# Patient Record
Sex: Female | Born: 1970 | Race: White | Hispanic: No | Marital: Married | Smoking: Never smoker
Health system: Southern US, Community
[De-identification: ages and names within clinical notes are randomized; demographics above are authoritative.]

## PROBLEM LIST (undated history)

## (undated) DIAGNOSIS — G4730 Sleep apnea, unspecified: Secondary | ICD-10-CM

## (undated) DIAGNOSIS — T7840XA Allergy, unspecified, initial encounter: Secondary | ICD-10-CM

## (undated) DIAGNOSIS — Z87442 Personal history of urinary calculi: Secondary | ICD-10-CM

## (undated) DIAGNOSIS — E119 Type 2 diabetes mellitus without complications: Secondary | ICD-10-CM

## (undated) DIAGNOSIS — I10 Essential (primary) hypertension: Secondary | ICD-10-CM

## (undated) DIAGNOSIS — B029 Zoster without complications: Secondary | ICD-10-CM

## (undated) DIAGNOSIS — G709 Myoneural disorder, unspecified: Secondary | ICD-10-CM

## (undated) DIAGNOSIS — K219 Gastro-esophageal reflux disease without esophagitis: Secondary | ICD-10-CM

## (undated) DIAGNOSIS — F419 Anxiety disorder, unspecified: Secondary | ICD-10-CM

## (undated) DIAGNOSIS — M1990 Unspecified osteoarthritis, unspecified site: Secondary | ICD-10-CM

## (undated) DIAGNOSIS — R112 Nausea with vomiting, unspecified: Secondary | ICD-10-CM

## (undated) DIAGNOSIS — J45909 Unspecified asthma, uncomplicated: Secondary | ICD-10-CM

## (undated) DIAGNOSIS — R011 Cardiac murmur, unspecified: Secondary | ICD-10-CM

## (undated) DIAGNOSIS — Z9889 Other specified postprocedural states: Secondary | ICD-10-CM

## (undated) HISTORY — DX: Sleep apnea, unspecified: G47.30

## (undated) HISTORY — PX: ABDOMINAL HYSTERECTOMY: SHX81

## (undated) HISTORY — DX: Zoster without complications: B02.9

## (undated) HISTORY — PX: BACK SURGERY: SHX140

## (undated) HISTORY — DX: Allergy, unspecified, initial encounter: T78.40XA

## (undated) HISTORY — DX: Cardiac murmur, unspecified: R01.1

## (undated) HISTORY — DX: Type 2 diabetes mellitus without complications: E11.9

## (undated) HISTORY — PX: TOTAL ABDOMINAL HYSTERECTOMY: SHX209

---

## 2004-06-11 HISTORY — PX: DIAGNOSTIC LAPAROSCOPY: SUR761

## 2004-06-13 ENCOUNTER — Ambulatory Visit: Payer: Self-pay | Admitting: General Surgery

## 2005-03-20 ENCOUNTER — Ambulatory Visit: Payer: Self-pay

## 2005-08-21 ENCOUNTER — Ambulatory Visit: Payer: Self-pay

## 2006-12-24 ENCOUNTER — Ambulatory Visit: Payer: Self-pay

## 2007-01-02 ENCOUNTER — Ambulatory Visit: Payer: Self-pay

## 2008-04-08 ENCOUNTER — Ambulatory Visit: Payer: Self-pay | Admitting: Internal Medicine

## 2008-04-29 ENCOUNTER — Ambulatory Visit: Payer: Self-pay | Admitting: Internal Medicine

## 2008-08-12 ENCOUNTER — Ambulatory Visit: Payer: Self-pay

## 2008-12-25 ENCOUNTER — Emergency Department: Payer: Self-pay | Admitting: Emergency Medicine

## 2010-03-02 ENCOUNTER — Encounter: Payer: Self-pay | Admitting: Podiatry

## 2010-03-11 ENCOUNTER — Encounter: Payer: Self-pay | Admitting: Podiatry

## 2010-06-11 HISTORY — PX: SPINAL FUSION: SHX223

## 2010-08-11 ENCOUNTER — Ambulatory Visit: Payer: Self-pay | Admitting: Sports Medicine

## 2010-09-04 ENCOUNTER — Encounter (HOSPITAL_COMMUNITY): Payer: PRIVATE HEALTH INSURANCE | Attending: Neurosurgery

## 2010-09-04 LAB — CBC
HCT: 39.5 % (ref 36.0–46.0)
MCH: 27.9 pg (ref 26.0–34.0)
MCHC: 33.2 g/dL (ref 30.0–36.0)
MCV: 84.2 fL (ref 78.0–100.0)
RDW: 13.4 % (ref 11.5–15.5)

## 2010-09-08 ENCOUNTER — Inpatient Hospital Stay (HOSPITAL_COMMUNITY)
Admission: RE | Admit: 2010-09-08 | Discharge: 2010-09-11 | DRG: 472 | Disposition: A | Discharge status: Home / Self Care | Payer: PRIVATE HEALTH INSURANCE | Source: Ambulatory Visit | Attending: Neurosurgery | Admitting: Neurosurgery

## 2010-09-08 ENCOUNTER — Inpatient Hospital Stay (HOSPITAL_COMMUNITY): Payer: PRIVATE HEALTH INSURANCE

## 2010-09-08 DIAGNOSIS — Z6841 Body Mass Index (BMI) 40.0 and over, adult: Secondary | ICD-10-CM

## 2010-09-08 DIAGNOSIS — Z01812 Encounter for preprocedural laboratory examination: Secondary | ICD-10-CM

## 2010-09-08 DIAGNOSIS — G4733 Obstructive sleep apnea (adult) (pediatric): Secondary | ICD-10-CM | POA: Diagnosis present

## 2010-09-08 DIAGNOSIS — M5000 Cervical disc disorder with myelopathy, unspecified cervical region: Principal | ICD-10-CM | POA: Diagnosis present

## 2010-09-08 DIAGNOSIS — M4000 Postural kyphosis, site unspecified: Secondary | ICD-10-CM | POA: Diagnosis present

## 2010-09-08 DIAGNOSIS — Z01810 Encounter for preprocedural cardiovascular examination: Secondary | ICD-10-CM

## 2010-09-12 NOTE — Op Note (Signed)
NAMEGLADIOLA, MADORE              ACCOUNT NO.:  [id number]  MEDICAL RECORD NO.:  [id number]           PATIENT TYPE:  I  LOCATION:  3104                         FACILITY:  [facility]  PHYSICIAN:  Danae Orleans. Venetia Maxon, M.D.  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  PROCEDURE:  Anterior cervical decompression and fusion C3-4, C4-5, and C5-6 levels, PEEK interbody cages, morselized bone autograft, allograft, PureGen, and anterior cervical plate.  SURGEON:  Danae Orleans. Venetia Maxon, MD  ASSISTANT:  Georgiann Cocker, RN and Hewitt Shorts, MD  ANESTHESIA:  General endotracheal anesthesia.  ESTIMATED BLOOD LOSS:  100 mL.  COMPLICATIONS:  None.  DISPOSITION:  Recovery.  INDICATION:  Tricia Hill is a 40 year old woman with severe cervical spondylitic myelopathy.  She is morbidly obese with a BMI of greater than 45.  She has significant cord compression at C3-4 and C4-5 levels with severe spondylosis at C5-6.  We elected to take her to surgery for anterior cervical decompression and fusion at C3-C6 levels.  PROCEDURE:  Ms. Spraggins was brought to the operating room.  Following a satisfactory and uncomplicated induction of general endotracheal anesthesia and placement of intravenous lines and Foley catheter, she was placed in a supine position on the operating table.  Her neck was placed in neutral alignment.  She was placed in 5 pounds of halter traction using padding because of her large body habitus.  Shoulders were taped to facilitate x-ray visualization.  Her anterior neck was then prepped and draped in usual sterile fashion.   Area of planned incision was infiltrated with local lidocaine.  Incision was made from the midline to the anterior border of sternocleidomastoid muscle.  Using blunt dissection, the carotid sheath was kept lateral and trachea and esophagus kept medial exposing the anterior cervical spine.  Bent spinal needles were placed where it was felt to be the C3-4, C4-5 levels.  The was confirmed on intraoperative x-ray with visualization of the needle at the C3-4 level.  During exposure, a small branch of the internal jugular was evulsed from the jugular vein, this was controlled with pledgets of Gelfoam and subsequently at the end of the case, it was inspected and found to be complete without any bleeding whatsoever and was covered with Surgicel.  Valsalva maneuver also did not demonstrate any bleeding whatsoever.  After further exposing the spine, the ventral osteophytes were removed with osteophyte tool at C3-4, C4-5, and C5-6 levels and using sequential decompression initially the C3-4 and subsequently C4-5 and C5-6 levels distraction pins were placed, interspace was incised, the spondylitic disk material was removed. Endplates were eburnated with high-speed drill and uncinate spurs were drilled down with high-speed drill.  The spinal cord dura was decompressed at each level and hemostasis was assured.  The C4-5 level was significantly worse and there was spondylitic rebar  at C3-4 which was taken down off the inferior aspect of C3.  At C4-5, there was a marked amount of herniated disk material, which was removed.  After each level was decompressed, hemostasis was assured, PEEK cages were packed with PureGen soaked profuse blocks accompanied with autograft removed at the time of the decompression.  A 5-mm medium PEEK cage was inserted at C3-4, a small 6-mm cage was inserted at C4-5, and a medium 6-mm cage was inserted at C5-6.  After all cages were placed, traction weight was removed, 48-mm  Trestle anterior cervical plate was then affixed to the anterior cervical spine with 12-mm variable angle screws, 2 at C3, 2 at C4, 2 at C5, 2 at C6; all screws had excellent purchase.  Locking mechanisms were engaged.  Wound was irrigated.  Soft tissues were inspected and found to be in good repair.  No evidence of any bleeding. The platysma layer was closed with 3-0 Vicryl sutures.  Skin edges were approximated 3-0 Vicryl subcu stitch.  Wound was dressed with Dermabond. At the end of the case, x-ray demonstrated superior aspect construct and appeared to be well positioned.  The patient was extubated in the operating room and taken to the recovery in a stable and satisfactory condition having tolerated the operation well.  Counts were correct at the end of the case.     Danae Orleans. Venetia Maxon, M.D.     JDS/MEDQ  D:  09/08/2010  T:  09/09/2010  Job:  [id number]  Electronically Signed by Maeola Harman M.D. on 09/12/2010 07:35:01 AM

## 2011-08-28 ENCOUNTER — Encounter: Payer: Self-pay | Admitting: Obstetrics and Gynecology

## 2011-09-10 ENCOUNTER — Encounter: Payer: Self-pay | Admitting: Obstetrics and Gynecology

## 2014-06-11 DIAGNOSIS — E119 Type 2 diabetes mellitus without complications: Secondary | ICD-10-CM

## 2014-06-11 HISTORY — DX: Type 2 diabetes mellitus without complications: E11.9

## 2014-09-15 ENCOUNTER — Encounter: Payer: Self-pay | Admitting: *Deleted

## 2015-01-31 ENCOUNTER — Encounter: Payer: Self-pay | Admitting: Family Medicine

## 2015-01-31 ENCOUNTER — Ambulatory Visit (INDEPENDENT_AMBULATORY_CARE_PROVIDER_SITE_OTHER): Payer: 59 | Admitting: Family Medicine

## 2015-01-31 VITALS — BP 154/90 | HR 80 | Temp 98.5°F | Ht 59.6 in | Wt 252.6 lb

## 2015-01-31 DIAGNOSIS — E1169 Type 2 diabetes mellitus with other specified complication: Secondary | ICD-10-CM | POA: Diagnosis not present

## 2015-01-31 DIAGNOSIS — Z114 Encounter for screening for human immunodeficiency virus [HIV]: Secondary | ICD-10-CM

## 2015-01-31 DIAGNOSIS — R030 Elevated blood-pressure reading, without diagnosis of hypertension: Secondary | ICD-10-CM | POA: Diagnosis not present

## 2015-01-31 DIAGNOSIS — E1165 Type 2 diabetes mellitus with hyperglycemia: Secondary | ICD-10-CM | POA: Diagnosis not present

## 2015-01-31 DIAGNOSIS — Z13220 Encounter for screening for lipoid disorders: Secondary | ICD-10-CM

## 2015-01-31 DIAGNOSIS — Z23 Encounter for immunization: Secondary | ICD-10-CM | POA: Diagnosis not present

## 2015-01-31 DIAGNOSIS — G4733 Obstructive sleep apnea (adult) (pediatric): Secondary | ICD-10-CM

## 2015-01-31 DIAGNOSIS — G4730 Sleep apnea, unspecified: Secondary | ICD-10-CM | POA: Insufficient documentation

## 2015-01-31 DIAGNOSIS — IMO0002 Reserved for concepts with insufficient information to code with codable children: Secondary | ICD-10-CM

## 2015-01-31 DIAGNOSIS — E118 Type 2 diabetes mellitus with unspecified complications: Secondary | ICD-10-CM | POA: Insufficient documentation

## 2015-01-31 DIAGNOSIS — IMO0001 Reserved for inherently not codable concepts without codable children: Secondary | ICD-10-CM

## 2015-01-31 DIAGNOSIS — E785 Hyperlipidemia, unspecified: Secondary | ICD-10-CM

## 2015-01-31 DIAGNOSIS — R7309 Other abnormal glucose: Secondary | ICD-10-CM

## 2015-01-31 DIAGNOSIS — Z90710 Acquired absence of both cervix and uterus: Secondary | ICD-10-CM

## 2015-01-31 LAB — MICROSCOPIC EXAMINATION

## 2015-01-31 LAB — UA/M W/RFLX CULTURE, ROUTINE
BILIRUBIN UA: NEGATIVE
Glucose, UA: NEGATIVE
Ketones, UA: NEGATIVE
LEUKOCYTES UA: NEGATIVE
NITRITE UA: NEGATIVE
PH UA: 5 (ref 5.0–7.5)
RBC UA: NEGATIVE
Specific Gravity, UA: 1.03 (ref 1.005–1.030)
Urobilinogen, Ur: 0.2 mg/dL (ref 0.2–1.0)

## 2015-01-31 LAB — MICROALBUMIN, URINE WAIVED
Creatinine, Urine Waived: 300 mg/dL (ref 10–300)
MICROALB, UR WAIVED: 150 mg/L — AB (ref 0–19)

## 2015-01-31 LAB — LIPID PANEL PICCOLO, WAIVED
CHOL/HDL RATIO PICCOLO,WAIVE: 4.9 mg/dL
Cholesterol Piccolo, Waived: 217 mg/dL — ABNORMAL HIGH (ref <200)
HDL CHOL PICCOLO, WAIVED: 45 mg/dL — AB (ref >59)
LDL Chol Calc Piccolo Waived: 120 mg/dL — ABNORMAL HIGH (ref <100)
TRIGLYCERIDES PICCOLO,WAIVED: 261 mg/dL — AB (ref <150)
VLDL CHOL CALC PICCOLO,WAIVE: 52 mg/dL — AB (ref <30)

## 2015-01-31 LAB — CBC WITH DIFFERENTIAL/PLATELET
HEMOGLOBIN: 13.4 g/dL (ref 11.1–15.9)
Hematocrit: 39.9 % (ref 34.0–46.6)
Lymphocytes Absolute: 2.5 10*3/uL (ref 0.7–3.1)
Lymphs: 30 %
MCH: 29.3 pg (ref 26.6–33.0)
MCHC: 33.6 g/dL (ref 31.5–35.7)
MCV: 87 fL (ref 79–97)
MID (Absolute): 0.5 10*3/uL (ref 0.1–1.6)
MID: 6 %
Neutrophils Absolute: 5.1 10*3/uL (ref 1.4–7.0)
Neutrophils: 64 %
Platelets: 267 10*3/uL (ref 150–379)
RBC: 4.57 x10E6/uL (ref 3.77–5.28)
RDW: 13.9 % (ref 12.3–15.4)
WBC: 8.1 10*3/uL (ref 3.4–10.8)

## 2015-01-31 LAB — BAYER DCA HB A1C WAIVED: HB A1C (BAYER DCA - WAIVED): 10.3 % — ABNORMAL HIGH (ref <7.0)

## 2015-01-31 MED ORDER — METFORMIN HCL ER (MOD) 1000 MG PO TB24: 1000.0000 mg | ORAL_TABLET | Freq: Two times a day (BID) | ORAL | Status: DC

## 2015-01-31 MED ORDER — FREESTYLE SYSTEM KIT: 1.0000 | PACK | Status: DC | PRN

## 2015-01-31 MED ORDER — GLUCOSE BLOOD VI STRP: ORAL_STRIP | Status: DC

## 2015-01-31 MED ORDER — FREESTYLE LANCETS MISC: Status: DC

## 2015-01-31 NOTE — Progress Notes (Signed)
BP 153/95 mmHg  Pulse 80  Temp(Src) 98.5 F (36.9 C)  Ht 4' 11.6" (1.514 m)  Wt 252 lb 9.6 oz (114.579 kg)  BMI 49.99 kg/m2  SpO2 95%  LMP  (LMP Unknown)   Subjective:    Patient ID: Tricia Hill, female    DOB: 1970/12/11, 44 y.o.   MRN: [id number]  HPI: Tricia Hill is a 44 y.o. female who presents today to establish care and for evaluation of    Chief Complaint  Patient presents with  . Diabetes   DIABETES- Had a 6.9 A1c 3-4 years ago, has never been on medicine. Never followed up.  Hypoglycemic episodes:no Polydipsia/polyuria: yes Visual disturbance: yes Chest pain: no Paresthesias: no Glucose Monitoring: no Blood Pressure Monitoring: not checking Retinal Examination: Not up to Date Foot Exam: Up to Date Diabetic Education: Not Completed Pneumovax: to be given next visit Influenza: To get through work Aspirin: no  Relevant past medical, surgical, family and social history reviewed and updated as indicated. Interim medical history since our last visit reviewed. Allergies and medications reviewed and updated.  Review of Systems  Constitutional: Negative.   Respiratory: Negative.   Cardiovascular: Negative.   Gastrointestinal: Negative.   Endocrine: Positive for heat intolerance. Negative for cold intolerance, polydipsia, polyphagia and polyuria.       Hot flashes for about the past year  Musculoskeletal: Positive for back pain. Negative for myalgias, joint swelling, arthralgias, gait problem, neck pain and neck stiffness.  Psychiatric/Behavioral: Negative.     Per HPI unless specifically indicated above     Objective:    BP 153/95 mmHg  Pulse 80  Temp(Src) 98.5 F (36.9 C)  Ht 4' 11.6" (1.514 m)  Wt 252 lb 9.6 oz (114.579 kg)  BMI 49.99 kg/m2  SpO2 95%  LMP  (LMP Unknown)  Wt Readings from Last 3 Encounters:  01/31/15 252 lb 9.6 oz (114.579 kg)    Physical Exam  Constitutional: She is oriented to person, place, and time. She appears  well-developed and well-nourished. No distress.  Morbidly obese  HENT:  Head: Normocephalic and atraumatic.  Right Ear: Hearing normal.  Left Ear: Hearing normal.  Nose: Nose normal.  Eyes: Conjunctivae and lids are normal. Right eye exhibits no discharge. Left eye exhibits no discharge. No scleral icterus.  Cardiovascular: Normal rate, regular rhythm and normal heart sounds.  Exam reveals no gallop and no friction rub.   No murmur heard. Pulmonary/Chest: Effort normal and breath sounds normal. No respiratory distress. She has no wheezes. She has no rales. She exhibits no tenderness.  Musculoskeletal: Normal range of motion.  Neurological: She is alert and oriented to person, place, and time.  Skin: Skin is warm, dry and intact. No rash noted. No erythema. No pallor.  Psychiatric: She has a normal mood and affect. Her speech is normal and behavior is normal. Judgment and thought content normal. Cognition and memory are normal.  Nursing note and vitals reviewed.       Assessment & Plan:   Problem List Items Addressed This Visit      Respiratory   OSA (obstructive sleep apnea)    Referral to sleep medicine for repeat Sleep study (has gained 50+ lbs in the last 2 years) and to be fit for new mask. This possibly contributing to elevated BP and cholesterol as well as weight gain and fatigue. Await sleep study and treat as needed.       Relevant Orders   Ambulatory referral to Sleep Studies  Other   S/P hysterectomy   Uncontrolled diabetes mellitus - Primary    A1c came back today at 10.3. Will start 1000mg  of metformin ER BID- to start with 1000mg  only qAM for 1 week to avoid side effects. Will refer to the lifestyle center. Will refer to opthalmology, as she has been having blurred vision. Foot exam done today was normal. Microalbumin +, may need ACE-I, but she would like to hold on further medicine at this time. Education given. Work on diet and exercise and check back in in 1  month. Due for A1c again in 3 months. If still elevated, will consider victoza for weight loss.       Relevant Medications   metFORMIN (GLUMETZA) 1000 MG (MOD) 24 hr tablet   Other Relevant Orders   Ambulatory referral to Nutrition and Diabetic Education   Ambulatory referral to Ophthalmology   Pneumococcal polysaccharide vaccine 23-valent greater than or equal to 2yo subcutaneous/IM   Elevated BP    Possibly due to untreated OSA. Will give Dash diet and follow up in 1 month. Work on diet and exercise. Referral back to Sleep medicine made to get OSA treated. +microalb- will likely need ACE-I in future.       Relevant Orders   Comprehensive metabolic panel   CBC With Differential/Platelet   Microalbumin, Urine Waived   UA/M w/rflx Culture, Routine   TSH   Morbid obesity    Referral to lifestyle center made today for nutrition/diabetes education. Work on diet and exercise. Will go to victoza 2nd line if metformin doesn't bring her where she needs to go to aide in weight loss.       Relevant Medications   metFORMIN (GLUMETZA) 1000 MG (MOD) 24 hr tablet   Other Relevant Orders   Comprehensive metabolic panel   CBC With Differential/Platelet   Hyperlipidemia associated with type 2 diabetes mellitus   Relevant Medications   metFORMIN (GLUMETZA) 1000 MG (MOD) 24 hr tablet    Other Visit Diagnoses    Screening for HIV (human immunodeficiency virus)        Checked today.     Relevant Orders    HIV antibody    Immunization due        Tdap given today. Wants pneumovax, but not given- will given next visit. Ordered today.    Relevant Orders    Tdap vaccine greater than or equal to 7yo IM (Completed)    Pneumococcal polysaccharide vaccine 23-valent greater than or equal to 2yo subcutaneous/IM    Elevated hemoglobin A1c        In the past, will check A1c today.     Relevant Orders    Comprehensive metabolic panel    CBC With Differential/Platelet    Bayer DCA Hb A1c Waived    UA/M  w/rflx Culture, Routine    Screening cholesterol level        Relevant Orders    CBC With Differential/Platelet    Lipid Panel Piccolo, Waived        Follow up plan: Return in about 4 weeks (around 02/28/2015).

## 2015-01-31 NOTE — Assessment & Plan Note (Signed)
A1c came back today at 10.3. Will start 1000mg  of metformin ER BID- to start with 1000mg  only qAM for 1 week to avoid side effects. Will refer to the lifestyle center. Will refer to opthalmology, as she has been having blurred vision. Foot exam done today was normal. Microalbumin +, may need ACE-I, but she would like to hold on further medicine at this time. Education given. Work on diet and exercise and check back in in 1 month. Due for A1c again in 3 months. If still elevated, will consider victoza for weight loss.

## 2015-01-31 NOTE — Assessment & Plan Note (Signed)
Referral to sleep medicine for repeat Sleep study (has gained 50+ lbs in the last 2 years) and to be fit for new mask. This possibly contributing to elevated BP and cholesterol as well as weight gain and fatigue. Await sleep study and treat as needed.

## 2015-01-31 NOTE — Assessment & Plan Note (Signed)
Referral to lifestyle center made today for nutrition/diabetes education. Work on diet and exercise. Will go to victoza 2nd line if metformin doesn't bring her where she needs to go to aide in weight loss.

## 2015-01-31 NOTE — Patient Instructions (Signed)
DASH Eating Plan DASH stands for "Dietary Approaches to Stop Hypertension." The DASH eating plan is a healthy eating plan that has been shown to reduce high blood pressure (hypertension). Additional health benefits may include reducing the risk of type 2 diabetes mellitus, heart disease, and stroke. The DASH eating plan may also help with weight loss. WHAT DO I NEED TO KNOW ABOUT THE DASH EATING PLAN? For the DASH eating plan, you will follow these general guidelines:  Choose foods with a percent daily value for sodium of less than 5% (as listed on the food label).  Use salt-free seasonings or herbs instead of table salt or sea salt.  Check with your health care provider or pharmacist before using salt substitutes.  Eat lower-sodium products, often labeled as "lower sodium" or "no salt added."  Eat fresh foods.  Eat more vegetables, fruits, and low-fat dairy products.  Choose whole grains. Look for the word "whole" as the first word in the ingredient list.  Choose fish and skinless chicken or turkey more often than red meat. Limit fish, poultry, and meat to 6 oz (170 g) each day.  Limit sweets, desserts, sugars, and sugary drinks.  Choose heart-healthy fats.  Limit cheese to 1 oz (28 g) per day.  Eat more home-cooked food and less restaurant, buffet, and fast food.  Limit fried foods.  Cook foods using methods other than frying.  Limit canned vegetables. If you do use them, rinse them well to decrease the sodium.  When eating at a restaurant, ask that your food be prepared with less salt, or no salt if possible. WHAT FOODS CAN I EAT? Seek help from a dietitian for individual calorie needs. Grains Whole grain or whole wheat bread. Brown rice. Whole grain or whole wheat pasta. Quinoa, bulgur, and whole grain cereals. Low-sodium cereals. Corn or whole wheat flour tortillas. Whole grain cornbread. Whole grain crackers. Low-sodium crackers. Vegetables Fresh or frozen vegetables  (raw, steamed, roasted, or grilled). Low-sodium or reduced-sodium tomato and vegetable juices. Low-sodium or reduced-sodium tomato sauce and paste. Low-sodium or reduced-sodium canned vegetables.  Fruits All fresh, canned (in natural juice), or frozen fruits. Meat and Other Protein Products Ground beef (85% or leaner), grass-fed beef, or beef trimmed of fat. Skinless chicken or turkey. Ground chicken or turkey. Pork trimmed of fat. All fish and seafood. Eggs. Dried beans, peas, or lentils. Unsalted nuts and seeds. Unsalted canned beans. Dairy Low-fat dairy products, such as skim or 1% milk, 2% or reduced-fat cheeses, low-fat ricotta or cottage cheese, or plain low-fat yogurt. Low-sodium or reduced-sodium cheeses. Fats and Oils Tub margarines without trans fats. Light or reduced-fat mayonnaise and salad dressings (reduced sodium). Avocado. Safflower, olive, or canola oils. Natural peanut or almond butter. Other Unsalted popcorn and pretzels. The items listed above may not be a complete list of recommended foods or beverages. Contact your dietitian for more options. WHAT FOODS ARE NOT RECOMMENDED? Grains White bread. White pasta. White rice. Refined cornbread. Bagels and croissants. Crackers that contain trans fat. Vegetables Creamed or fried vegetables. Vegetables in a cheese sauce. Regular canned vegetables. Regular canned tomato sauce and paste. Regular tomato and vegetable juices. Fruits Dried fruits. Canned fruit in light or heavy syrup. Fruit juice. Meat and Other Protein Products Fatty cuts of meat. Ribs, chicken wings, bacon, sausage, bologna, salami, chitterlings, fatback, hot dogs, bratwurst, and packaged luncheon meats. Salted nuts and seeds. Canned beans with salt. Dairy Whole or 2% milk, cream, half-and-half, and cream cheese. Whole-fat or sweetened yogurt. Full-fat   cheeses or blue cheese. Nondairy creamers and whipped toppings. Processed cheese, cheese spreads, or cheese  curds. Condiments Onion and garlic salt, seasoned salt, table salt, and sea salt. Canned and packaged gravies. Worcestershire sauce. Tartar sauce. Barbecue sauce. Teriyaki sauce. Soy sauce, including reduced sodium. Steak sauce. Fish sauce. Oyster sauce. Cocktail sauce. Horseradish. Ketchup and mustard. Meat flavorings and tenderizers. Bouillon cubes. Hot sauce. Tabasco sauce. Marinades. Taco seasonings. Relishes. Fats and Oils Butter, stick margarine, lard, shortening, ghee, and bacon fat. Coconut, palm kernel, or palm oils. Regular salad dressings. Other Pickles and olives. Salted popcorn and pretzels. The items listed above may not be a complete list of foods and beverages to avoid. Contact your dietitian for more information. WHERE CAN I FIND MORE INFORMATION? National Heart, Lung, and [employer] Institute: www.nhlbi.nih.gov/health/health-topics/topics/dash/ Document Released: 05/17/2011 Document Revised: 10/12/2013 Document Reviewed: 04/01/2013 ExitCare Patient Information 2015 [employer]. This information is not intended to replace advice given to you by your health care provider. Make sure you discuss any questions you have with your health care provider. Diabetes Mellitus and Food It is important for you to manage your blood sugar (glucose) level. Your blood glucose level can be greatly affected by what you eat. Eating healthier foods in the appropriate amounts throughout the day at about the same time each day will help you control your blood glucose level. It can also help slow or prevent worsening of your diabetes mellitus. Healthy eating may even help you improve the level of your blood pressure and reach or maintain a healthy weight.  HOW CAN FOOD AFFECT ME? Carbohydrates Carbohydrates affect your blood glucose level more than any other type of food. Your dietitian will help you determine how many carbohydrates to eat at each meal and teach you how to count carbohydrates. Counting  carbohydrates is important to keep your blood glucose at a healthy level, especially if you are using insulin or taking certain medicines for diabetes mellitus. Alcohol Alcohol can cause sudden decreases in blood glucose (hypoglycemia), especially if you use insulin or take certain medicines for diabetes mellitus. Hypoglycemia can be a life-threatening condition. Symptoms of hypoglycemia (sleepiness, dizziness, and disorientation) are similar to symptoms of having too much alcohol.  If your health care provider has given you approval to drink alcohol, do so in moderation and use the following guidelines:  Women should not have more than one drink per day, and men should not have more than two drinks per day. One drink is equal to:  12 oz of beer.  5 oz of wine.  1 oz of hard liquor.  Do not drink on an empty stomach.  Keep yourself hydrated. Have water, diet soda, or unsweetened iced tea.  Regular soda, juice, and other mixers might contain a lot of carbohydrates and should be counted. WHAT FOODS ARE NOT RECOMMENDED? As you make food choices, it is important to remember that all foods are not the same. Some foods have fewer nutrients per serving than other foods, even though they might have the same number of calories or carbohydrates. It is difficult to get your body what it needs when you eat foods with fewer nutrients. Examples of foods that you should avoid that are high in calories and carbohydrates but low in nutrients include:  Trans fats (most processed foods list trans fats on the Nutrition Facts label).  Regular soda.  Juice.  Candy.  Sweets, such as cake, pie, doughnuts, and cookies.  Fried foods. WHAT FOODS CAN I EAT? Have nutrient-rich foods,   which will nourish your body and keep you healthy. The food you should eat also will depend on several factors, including:  The calories you need.  The medicines you take.  Your weight.  Your blood glucose level.  Your  blood pressure level.  Your cholesterol level. You also should eat a variety of foods, including:  Protein, such as meat, poultry, fish, tofu, nuts, and seeds (lean animal proteins are best).  Fruits.  Vegetables.  Dairy products, such as milk, cheese, and yogurt (low fat is best).  Breads, grains, pasta, cereal, rice, and beans.  Fats such as olive oil, trans fat-free margarine, canola oil, avocado, and olives. DOES EVERYONE WITH DIABETES MELLITUS HAVE THE SAME MEAL PLAN? Because every person with diabetes mellitus is different, there is not one meal plan that works for everyone. It is very important that you meet with a dietitian who will help you create a meal plan that is just right for you. Document Released: 02/22/2005 Document Revised: 06/02/2013 Document Reviewed: 04/24/2013 ExitCare Patient Information 2015 [employer]. This information is not intended to replace advice given to you by your health care provider. Make sure you discuss any questions you have with your health care provider.  

## 2015-01-31 NOTE — Assessment & Plan Note (Signed)
Possibly due to untreated OSA. Will give Dash diet and follow up in 1 month. Work on diet and exercise. Referral back to Sleep medicine made to get OSA treated. +microalb- will likely need ACE-I in future.

## 2015-02-01 ENCOUNTER — Telehealth: Payer: Self-pay | Admitting: Family Medicine

## 2015-02-01 ENCOUNTER — Encounter: Payer: Self-pay | Admitting: Family Medicine

## 2015-02-01 LAB — COMPREHENSIVE METABOLIC PANEL
A/G RATIO: 1.6 (ref 1.1–2.5)
ALBUMIN: 4.2 g/dL (ref 3.5–5.5)
ALK PHOS: 128 IU/L — AB (ref 39–117)
ALT: 22 IU/L (ref 0–32)
AST: 22 IU/L (ref 0–40)
BILIRUBIN TOTAL: 0.2 mg/dL (ref 0.0–1.2)
BUN / CREAT RATIO: 22 (ref 9–23)
BUN: 11 mg/dL (ref 6–24)
CHLORIDE: 101 mmol/L (ref 97–108)
CO2: 26 mmol/L (ref 18–29)
Calcium: 9.2 mg/dL (ref 8.7–10.2)
Creatinine, Ser: 0.51 mg/dL — ABNORMAL LOW (ref 0.57–1.00)
GFR calc Af Amer: 137 mL/min/{1.73_m2} (ref >59)
GFR calc non Af Amer: 119 mL/min/{1.73_m2} (ref >59)
GLOBULIN, TOTAL: 2.6 g/dL (ref 1.5–4.5)
GLUCOSE: 260 mg/dL — AB (ref 65–99)
POTASSIUM: 4.2 mmol/L (ref 3.5–5.2)
SODIUM: 141 mmol/L (ref 134–144)
Total Protein: 6.8 g/dL (ref 6.0–8.5)

## 2015-02-01 LAB — TSH: TSH: 3.02 u[IU]/mL (ref 0.450–4.500)

## 2015-02-01 LAB — HIV ANTIBODY (ROUTINE TESTING W REFLEX): HIV Screen 4th Generation wRfx: NONREACTIVE

## 2015-02-01 NOTE — Telephone Encounter (Signed)
Pt called stated her insurance will not cover the extended release Metformin. Pt wants to know if something else can be sent to the pharmacy. Pharm is [facility] on [address] in [place]. Thanks.

## 2015-02-02 ENCOUNTER — Other Ambulatory Visit: Payer: Self-pay | Admitting: Family Medicine

## 2015-02-02 DIAGNOSIS — E1165 Type 2 diabetes mellitus with hyperglycemia: Secondary | ICD-10-CM

## 2015-02-02 DIAGNOSIS — IMO0002 Reserved for concepts with insufficient information to code with codable children: Secondary | ICD-10-CM

## 2015-02-02 MED ORDER — METFORMIN HCL 1000 MG PO TABS: ORAL_TABLET | ORAL | Status: DC

## 2015-02-02 NOTE — Progress Notes (Signed)
Metformin ER not covered by her insurance. Metformin IR sent to her pharmacy.

## 2015-02-02 NOTE — Telephone Encounter (Signed)
Called and notified patient that a different medication was sent to her pharmacy.

## 2015-02-08 ENCOUNTER — Encounter: Payer: Self-pay | Admitting: *Deleted

## 2015-02-08 ENCOUNTER — Encounter: Payer: 59 | Attending: Family Medicine | Admitting: *Deleted

## 2015-02-08 VITALS — BP 136/82 | Ht 61.0 in | Wt 251.4 lb

## 2015-02-08 DIAGNOSIS — E119 Type 2 diabetes mellitus without complications: Secondary | ICD-10-CM | POA: Diagnosis present

## 2015-02-08 NOTE — Patient Instructions (Addendum)
Check blood sugars 2 x day before breakfast and 2 hrs after supper every day Exercise: Begin walking for 15  minutes  3  days a week and gradually increase to 150 minutes/week Avoid sugar sweetened drinks (soda, juices)  Eat 3 meals day,  1-2 snacks a day Space meals 4-6 hours apart Don't skip meals Bring blood sugar records to the next class

## 2015-02-08 NOTE — Progress Notes (Signed)
Diabetes Self-Management Education  Visit Type: First/Initial  Appt. Start Time: 0920 Appt. End Time: 1030  02/08/2015  [name], identified by name and date of birth, is a 44 y.o. female with a diagnosis of Diabetes: Type 2.   ASSESSMENT  Blood pressure 136/82, height 5\' 1"  (1.549 m), weight 251 lb 6.4 oz (114.034 kg). Body mass index is 47.53 kg/(m^2).      Diabetes Self-Management Education - 02/08/15 1135    Visit Information   Visit Type First/Initial   Initial Visit   Diabetes Type Type 2   Are you currently following a meal plan? Yes   What type of meal plan do you follow? Stopped drinking soft drinks   Are you taking your medications as prescribed? Yes   Date Diagnosed "last week"   Health Coping   How would you rate your overall health? Good   Psychosocial Assessment   Patient Belief/Attitude about Diabetes Motivated to manage diabetes  "concerned about overall health, but I know I can manage my diabetes"   Self-care barriers None   Self-management support Friends;Doctor's office   Patient Concerns Nutrition/Meal planning;Medication;Monitoring;Healthy Lifestyle;Glycemic Control;Weight Control   Special Needs None   Preferred Learning Style Auditory;Visual;Hands on   [place] in progress   How often do you need to have someone help you when you read instructions, pamphlets, or other written materials from your doctor or pharmacy? 1 - Never   What is the last grade level you completed in school? [employer] degree in Nursing   Complications   Last HgB A1C per patient/outside source 10.3 %  01/31/15   How often do you check your blood sugar? 1-2 times/day   Fasting Blood glucose range (mg/dL) 130-179;180-200; FBG today was 183 mg/dL   Postprandial Blood glucose range (mg/dL) 130-179;180-200;>200   Have you had a dilated eye exam in the past 12 months? No  appt in October   Have you had a dental exam in the past 12 months? No   Are you checking  your feet? No   Dietary Intake   Breakfast eats out every day - biscuit from [employer] sandwich, subs, salads, vegetables and rice; sometimes skips   Dinner pasta, soup, fast foods   Beverage(s) water or diet   Exercise   Exercise Type ADL's   Patient Education   Previous Diabetes Education No  Pt came with husband 10 years ago when he was diagnosed   Disease state  Definition of diabetes, type 1 and 2, and the diagnosis of diabetes;Factors that contribute to the development of diabetes   Nutrition management  Role of diet in the treatment of diabetes and the relationship between the three main macronutrients and blood glucose level   Physical activity and exercise  Role of exercise on diabetes management, blood pressure control and cardiac health.   Medications Reviewed patients medication for diabetes, action, purpose, timing of dose and side effects.   Monitoring Purpose and frequency of SMBG.;Identified appropriate SMBG and/or A1C goals.   Chronic complications Relationship between chronic complications and blood glucose control   Psychosocial adjustment Identified and addressed patients feelings and concerns about diabetes   Outcomes   Expected Outcomes Demonstrated interest in learning. Expect positive outcomes    Goals  (Developed by patient): Improve blood sugars Decrease medications Prevent diabetes complications Lose weight Lead a healthier lifestyle Become more fit  Individualized Plan for Diabetes Self-Management Training:   Learning Objective:  Patient will have a greater understanding  of diabetes self-management. Patient education plan is to attend individual and/or group sessions per assessed needs and concerns.   Plan:   Patient Instructions  Check blood sugars 2 x day before breakfast and 2 hrs after supper every day Exercise: Begin walking for 15  minutes  3  days a week and gradually increase to 150 minutes/week Avoid sugar sweetened drinks (soda,  juices)  Eat 3 meals day,  1-2 snacks a day Space meals 4-6 hours apart Don't skip meals Bring blood sugar records to the next class  Expected Outcomes:  Demonstrated interest in learning. Expect positive outcomes  Education material provided:  General Meal Planning Guidelines  If problems or questions, patient to contact team via:   Johny Drilling, [place], [place], CDE [phone]  Future DSME appointment:  Sept 1, 2016 for Class 1

## 2015-02-10 ENCOUNTER — Encounter: Payer: 59 | Attending: Family Medicine | Admitting: Dietician

## 2015-02-10 VITALS — Ht 61.0 in | Wt 251.6 lb

## 2015-02-10 DIAGNOSIS — E119 Type 2 diabetes mellitus without complications: Secondary | ICD-10-CM | POA: Insufficient documentation

## 2015-02-10 NOTE — Progress Notes (Signed)

## 2015-02-17 ENCOUNTER — Encounter: Payer: Self-pay | Admitting: *Deleted

## 2015-02-17 ENCOUNTER — Encounter: Payer: 59 | Admitting: *Deleted

## 2015-02-17 VITALS — Wt 248.8 lb

## 2015-02-17 DIAGNOSIS — E119 Type 2 diabetes mellitus without complications: Secondary | ICD-10-CM | POA: Diagnosis not present

## 2015-02-17 NOTE — Progress Notes (Signed)

## 2015-02-24 ENCOUNTER — Encounter: Payer: 59 | Admitting: Dietician

## 2015-02-24 VITALS — Ht 61.0 in | Wt 250.0 lb

## 2015-02-24 DIAGNOSIS — E119 Type 2 diabetes mellitus without complications: Secondary | ICD-10-CM

## 2015-02-24 NOTE — Progress Notes (Signed)

## 2015-02-28 ENCOUNTER — Ambulatory Visit (INDEPENDENT_AMBULATORY_CARE_PROVIDER_SITE_OTHER): Payer: 59 | Admitting: Family Medicine

## 2015-02-28 ENCOUNTER — Encounter: Payer: Self-pay | Admitting: Family Medicine

## 2015-02-28 VITALS — BP 129/87 | HR 81 | Temp 97.8°F

## 2015-02-28 DIAGNOSIS — Z23 Encounter for immunization: Secondary | ICD-10-CM | POA: Diagnosis not present

## 2015-02-28 DIAGNOSIS — R030 Elevated blood-pressure reading, without diagnosis of hypertension: Secondary | ICD-10-CM

## 2015-02-28 DIAGNOSIS — IMO0001 Reserved for inherently not codable concepts without codable children: Secondary | ICD-10-CM

## 2015-02-28 DIAGNOSIS — E1165 Type 2 diabetes mellitus with hyperglycemia: Secondary | ICD-10-CM | POA: Diagnosis not present

## 2015-02-28 DIAGNOSIS — IMO0002 Reserved for concepts with insufficient information to code with codable children: Secondary | ICD-10-CM

## 2015-02-28 NOTE — Assessment & Plan Note (Signed)
Much better today. Continue to work on diet and exercise. Monitor at home. Will hold on BP medicine at this time.

## 2015-02-28 NOTE — Assessment & Plan Note (Signed)
Continue to follow with lifestyle center. Due for recheck on A1c in 2 months.

## 2015-02-28 NOTE — Progress Notes (Signed)
BP 129/87 mmHg  Pulse 81  Temp(Src) 97.8 F (36.6 C)  SpO2 94%  LMP  (LMP Unknown)   Subjective:    Patient ID: Tricia Hill, female    DOB: May 04, 1971, 44 y.o.   MRN: [id number]  HPI: Tricia Hill is a 44 y.o. female  Chief Complaint  Patient presents with  . Hypertension   Elevated Blood Pressure Hypertension status: controlled  Satisfied with current treatment? yes Duration of hypertension: unclear- noticed last visit BP monitoring frequency:  not checking BP medication side effects:  no Aspirin: no Recurrent headaches: no Visual changes: no Palpitations: no Dyspnea: no Chest pain: no Lower extremity edema: no Dizzy/lightheaded: no  Relevant past medical, surgical, family and social history reviewed and updated as indicated. Interim medical history since our last visit reviewed. Allergies and medications reviewed and updated.  Review of Systems  Constitutional: Negative.   Respiratory: Negative.   Cardiovascular: Negative.   Musculoskeletal: Negative.   Psychiatric/Behavioral: Negative.    Per HPI unless specifically indicated above     Objective:    BP 129/87 mmHg  Pulse 81  Temp(Src) 97.8 F (36.6 C)  SpO2 94%  LMP  (LMP Unknown)  Wt Readings from Last 3 Encounters:  02/24/15 250 lb (113.399 kg)  02/17/15 248 lb 12.8 oz (112.855 kg)  02/10/15 251 lb 9.6 oz (114.125 kg)    Physical Exam  Constitutional: She is oriented to person, place, and time. She appears well-developed and well-nourished. No distress.  HENT:  Head: Normocephalic and atraumatic.  Right Ear: Hearing normal.  Left Ear: Hearing normal.  Nose: Nose normal.  Eyes: Conjunctivae and lids are normal. Right eye exhibits no discharge. Left eye exhibits no discharge. No scleral icterus.  Cardiovascular: Normal rate, regular rhythm, normal heart sounds and intact distal pulses.  Exam reveals no gallop and no friction rub.   No murmur heard. Pulmonary/Chest: Effort normal and  breath sounds normal. No respiratory distress. She has no wheezes. She has no rales. She exhibits no tenderness.  Musculoskeletal: Normal range of motion.  Neurological: She is alert and oriented to person, place, and time.  Skin: Skin is warm, dry and intact. No rash noted. No erythema. No pallor.  Psychiatric: She has a normal mood and affect. Her speech is normal and behavior is normal. Judgment and thought content normal. Cognition and memory are normal.  Nursing note and vitals reviewed.   Results for orders placed or performed in visit on 01/31/15  Microscopic Examination  Result Value Ref Range   WBC, UA 0-5 0 -  5 /hpf   RBC, UA 0-2 0 -  2 /hpf   Epithelial Cells (non renal) 0-10 0 - 10 /hpf   Casts Present (A) None seen /lpf   Cast Type Hyaline casts N/A   Mucus, UA Present Not Estab.   Bacteria, UA Few None seen/Few  HIV antibody  Result Value Ref Range   HIV Screen 4th Generation wRfx Non Reactive Non Reactive  Comprehensive metabolic panel  Result Value Ref Range   Glucose 260 (H) 65 - 99 mg/dL   BUN 11 6 - 24 mg/dL   Creatinine, Ser 0.51 (L) 0.57 - 1.00 mg/dL   GFR calc non Af Amer 119 >59 mL/min/1.73   GFR calc Af Amer 137 >59 mL/min/1.73   BUN/Creatinine Ratio 22 9 - 23   Sodium 141 134 - 144 mmol/L   Potassium 4.2 3.5 - 5.2 mmol/L   Chloride 101 97 - 108 mmol/L  CO2 26 18 - 29 mmol/L   Calcium 9.2 8.7 - 10.2 mg/dL   Total Protein 6.8 6.0 - 8.5 g/dL   Albumin 4.2 3.5 - 5.5 g/dL   Globulin, Total 2.6 1.5 - 4.5 g/dL   Albumin/Globulin Ratio 1.6 1.1 - 2.5   Bilirubin Total 0.2 0.0 - 1.2 mg/dL   Alkaline Phosphatase 128 (H) 39 - 117 IU/L   AST 22 0 - 40 IU/L   ALT 22 0 - 32 IU/L  CBC With Differential/Platelet  Result Value Ref Range   WBC 8.1 3.4 - 10.8 x10E3/uL   RBC 4.57 3.77 - 5.28 x10E6/uL   Hemoglobin 13.4 11.1 - 15.9 g/dL   Hematocrit 39.9 34.0 - 46.6 %   MCV 87 79 - 97 fL   MCH 29.3 26.6 - 33.0 pg   MCHC 33.6 31.5 - 35.7 g/dL   RDW 13.9 12.3 -  15.4 %   Platelets 267 150 - 379 x10E3/uL   Neutrophils 64 %   Lymphs 30 %   MID 6 %   Neutrophils Absolute 5.1 1.4 - 7.0 x10E3/uL   Lymphocytes Absolute 2.5 0.7 - 3.1 x10E3/uL   MID (Absolute) 0.5 0.1 - 1.6 X10E3/uL  Bayer DCA Hb A1c Waived  Result Value Ref Range   Bayer DCA Hb A1c Waived 10.3 (H) <7.0 %  Lipid Panel Piccolo, Waived  Result Value Ref Range   Cholesterol Piccolo, Waived 217 (H) <200 mg/dL   HDL Chol Piccolo, Waived 45 (L) >59 mg/dL   Triglycerides Piccolo,Waived 261 (H) <150 mg/dL   Chol/HDL Ratio Piccolo,Waive 4.9 mg/dL   LDL Chol Calc Piccolo Waived 120 (H) <100 mg/dL   VLDL Chol Calc Piccolo,Waive 52 (H) <30 mg/dL  Microalbumin, Urine Waived  Result Value Ref Range   Microalb, Ur Waived 150 (H) 0 - 19 mg/L   Creatinine, Urine Waived 300 10 - 300 mg/dL   Microalb/Creat Ratio 30-300 (H) <30 mg/g  UA/M w/rflx Culture, Routine  Result Value Ref Range   Specific Gravity, UA 1.030 1.005 - 1.030   pH, UA 5.0 5.0 - 7.5   Color, UA Yellow Yellow   Appearance Ur Cloudy (A) Clear   Leukocytes, UA Negative Negative   Protein, UA 2+ (A) Negative/Trace   Glucose, UA Negative Negative   Ketones, UA Negative Negative   RBC, UA Negative Negative   Bilirubin, UA Negative Negative   Urobilinogen, Ur 0.2 0.2 - 1.0 mg/dL   Nitrite, UA Negative Negative   Microscopic Examination See below:   TSH  Result Value Ref Range   TSH 3.020 0.450 - 4.500 uIU/mL      Assessment & Plan:   Problem List Items Addressed This Visit      Other   Uncontrolled diabetes mellitus    Continue to follow with [facility] center. Due for recheck on A1c in 2 months.       Elevated BP - Primary    Much better today. Continue to work on diet and exercise. Monitor at home. Will hold on BP medicine at this time.        Other Visit Diagnoses    Immunization due        Immunization due        Pneumovax given today. Will have flu done through work.         Follow up plan: Return in  about 2 months (around 04/30/2015) for DM visit.

## 2015-03-03 ENCOUNTER — Encounter: Payer: Self-pay | Admitting: *Deleted

## 2015-03-16 LAB — HM DIABETES EYE EXAM

## 2015-03-18 ENCOUNTER — Encounter: Payer: Self-pay | Admitting: Family Medicine

## 2015-04-29 ENCOUNTER — Ambulatory Visit (INDEPENDENT_AMBULATORY_CARE_PROVIDER_SITE_OTHER): Payer: 59 | Admitting: Family Medicine

## 2015-04-29 ENCOUNTER — Encounter: Payer: Self-pay | Admitting: Family Medicine

## 2015-04-29 VITALS — BP 142/85 | HR 83 | Temp 97.6°F | Wt 246.0 lb

## 2015-04-29 DIAGNOSIS — G4733 Obstructive sleep apnea (adult) (pediatric): Secondary | ICD-10-CM

## 2015-04-29 DIAGNOSIS — E119 Type 2 diabetes mellitus without complications: Secondary | ICD-10-CM | POA: Diagnosis not present

## 2015-04-29 DIAGNOSIS — G4730 Sleep apnea, unspecified: Secondary | ICD-10-CM

## 2015-04-29 DIAGNOSIS — E1165 Type 2 diabetes mellitus with hyperglycemia: Secondary | ICD-10-CM | POA: Diagnosis not present

## 2015-04-29 DIAGNOSIS — R030 Elevated blood-pressure reading, without diagnosis of hypertension: Secondary | ICD-10-CM

## 2015-04-29 DIAGNOSIS — Z1239 Encounter for other screening for malignant neoplasm of breast: Secondary | ICD-10-CM

## 2015-04-29 DIAGNOSIS — IMO0001 Reserved for inherently not codable concepts without codable children: Secondary | ICD-10-CM

## 2015-04-29 LAB — BAYER DCA HB A1C WAIVED: HB A1C: 8.6 % — AB (ref <7.0)

## 2015-04-29 MED ORDER — DULAGLUTIDE 0.75 MG/0.5ML ~~LOC~~ SOAJ: 0.7500 mg | SUBCUTANEOUS | Status: DC

## 2015-04-29 NOTE — Assessment & Plan Note (Addendum)
Much better! A1c down to 8.6 from 10.9. Will start on trulicity for better control. Call if having side effects. Check back in in 3 months for repeat A1c. First injection of trulicity given today and sample given.

## 2015-04-29 NOTE — Progress Notes (Signed)
BP 142/85 mmHg  Pulse 83  Temp(Src) 97.6 F (36.4 C)  Wt 246 lb (111.585 kg)  SpO2 96%  LMP  (LMP Unknown)   Subjective:    Patient ID: Tricia Hill, female    DOB: 1970-10-13, 44 y.o.   MRN: [id number]  HPI: Tricia Hill is a 44 y.o. female  Chief Complaint  Patient presents with  . Diabetes    Patient is here for a follow up on her diabetes  . Sleep Apnea    Patient still has not gotten a call from the sleep center   DIABETES- has been feeling pretty good Hypoglycemic episodes:no Polydipsia/polyuria: no polydipsia, + polyuria Visual disturbance: no Chest pain: no Paresthesias: no Glucose Monitoring: yes  Accucheck frequency: Daily  Fasting glucose: 160s/170, has been in the 140s Taking Insulin?: no Blood Pressure Monitoring: not checking Retinal Examination: Up to Date Foot Exam: Up to Date Diabetic Education: Completed Pneumovax: Up to Date Influenza: Up to Date Aspirin: no  Relevant past medical, surgical, family and social history reviewed and updated as indicated. Interim medical history since our last visit reviewed. Allergies and medications reviewed and updated.  Review of Systems  Constitutional: Negative.   Respiratory: Negative.   Cardiovascular: Negative.   Musculoskeletal: Negative.   Psychiatric/Behavioral: Negative.     Per HPI unless specifically indicated above    Objective:    BP 142/85 mmHg  Pulse 83  Temp(Src) 97.6 F (36.4 C)  Wt 246 lb (111.585 kg)  SpO2 96%  LMP  (LMP Unknown)  Wt Readings from Last 3 Encounters:  04/29/15 246 lb (111.585 kg)  02/24/15 250 lb (113.399 kg)  02/17/15 248 lb 12.8 oz (112.855 kg)    Physical Exam  Constitutional: She is oriented to person, place, and time. She appears well-developed and well-nourished. No distress.  HENT:  Head: Normocephalic and atraumatic.  Right Ear: Hearing normal.  Left Ear: Hearing normal.  Nose: Nose normal.  Eyes: Conjunctivae and lids are normal. Right eye  exhibits no discharge. Left eye exhibits no discharge. No scleral icterus.  Cardiovascular: Normal rate, regular rhythm, normal heart sounds and intact distal pulses.  Exam reveals no gallop and no friction rub.   No murmur heard. Pulmonary/Chest: Effort normal. No respiratory distress. She has no wheezes. She has no rales. She exhibits no tenderness.  Musculoskeletal: Normal range of motion.  Neurological: She is alert and oriented to person, place, and time.  Skin: Skin is warm, dry and intact. No rash noted. No erythema. No pallor.  Psychiatric: She has a normal mood and affect. Her speech is normal and behavior is normal. Judgment and thought content normal. Cognition and memory are normal.  Nursing note and vitals reviewed.   Results for orders placed or performed in visit on 03/18/15  HM DIABETES EYE EXAM  Result Value Ref Range   HM Diabetic Eye Exam No Retinopathy No Retinopathy      Assessment & Plan:   Problem List Items Addressed This Visit      Respiratory   OSA (obstructive sleep apnea)     Other   Sleep apnea    Has not heard from them. Rx sent over again. They will contact her for titration and mask change.      Uncontrolled diabetes mellitus ([place])    Much better! A1c down to 8.6 from 10.9. Will start on trulicity for better control. Call if having side effects. Check back in in 3 months for repeat A1c. First injection of  trulicity given today and sample given.      Relevant Medications   Dulaglutide (TRULICITY) [id number] SOPN   Elevated BP    Possibly due to issues with her sleep apnea. Get titrated and recheck at follow up       Other Visit Diagnoses    Diabetes mellitus without complication ([place])    -  Primary    Relevant Medications    Dulaglutide (TRULICITY) [id number] SOPN    Other Relevant Orders    Bayer DCA Hb A1c Waived    Screening for breast cancer        Mammo ordered today    Relevant Orders    MM DIGITAL SCREENING BILATERAL         Follow up plan: Return in about 3 months (around 07/30/2015) for DM follow up.

## 2015-04-29 NOTE — Assessment & Plan Note (Signed)
Has not heard from them. Rx sent over again. They will contact her for titration and mask change.

## 2015-04-29 NOTE — Assessment & Plan Note (Signed)
Possibly due to issues with her sleep apnea. Get titrated and recheck at follow up

## 2015-05-10 ENCOUNTER — Ambulatory Visit (INDEPENDENT_AMBULATORY_CARE_PROVIDER_SITE_OTHER): Payer: 59 | Admitting: Family Medicine

## 2015-05-10 ENCOUNTER — Encounter: Payer: Self-pay | Admitting: Family Medicine

## 2015-05-10 VITALS — BP 145/87 | HR 110 | Temp 100.3°F

## 2015-05-10 DIAGNOSIS — R062 Wheezing: Secondary | ICD-10-CM

## 2015-05-10 DIAGNOSIS — J069 Acute upper respiratory infection, unspecified: Secondary | ICD-10-CM

## 2015-05-10 DIAGNOSIS — J209 Acute bronchitis, unspecified: Secondary | ICD-10-CM | POA: Diagnosis not present

## 2015-05-10 LAB — PLEASE NOTE:

## 2015-05-10 LAB — INFLUENZA A AND B
Influenza A Ag, EIA: NEGATIVE
Influenza B Ag, EIA: NEGATIVE

## 2015-05-10 MED ORDER — ALBUTEROL SULFATE (2.5 MG/3ML) 0.083% IN NEBU: 2.5000 mg | INHALATION_SOLUTION | Freq: Once | RESPIRATORY_TRACT | Status: DC

## 2015-05-10 MED ORDER — BENZONATATE 200 MG PO CAPS: 200.0000 mg | ORAL_CAPSULE | Freq: Three times a day (TID) | ORAL | Status: DC | PRN

## 2015-05-10 MED ORDER — AZITHROMYCIN 250 MG PO TABS: ORAL_TABLET | ORAL | Status: DC

## 2015-05-10 MED ORDER — HYDROCOD POLST-CPM POLST ER 10-8 MG/5ML PO SUER: 5.0000 mL | Freq: Every evening | ORAL | Status: DC | PRN

## 2015-05-10 MED ORDER — PREDNISONE 10 MG PO TABS: ORAL_TABLET | ORAL | Status: DC

## 2015-05-10 NOTE — Progress Notes (Signed)
BP 145/87 mmHg  Pulse 110  Temp(Src) 100.3 F (37.9 C)  SpO2 94%  LMP  (LMP Unknown)   Subjective:    Patient ID: Tricia Hill, female    DOB: Sep 16, 1970, 44 y.o.   MRN: [id number]  HPI: Tricia Hill is a 44 y.o. female  Chief Complaint  Patient presents with  . URI    Cough started Saturday, now she has aches and pains and fever.   UPPER RESPIRATORY TRACT INFECTION- Started with a cough on Saturday, but then started with fever and body aches yesterday Worst symptom: cough, body aches, fever Fever: yes Cough: yes Shortness of breath: no Wheezing: yes Chest pain: yes, with cough Chest tightness: yes Chest congestion: no Nasal congestion: no Runny nose: yes Post nasal drip: no Sneezing: no Sore throat: no Swollen glands: no Sinus pressure: yes Headache: yes Face pain: yes Toothache: no Ear pain: no  Ear pressure: no  Eyes red/itching:no Eye drainage/crusting: no  Vomiting: yes Rash: no Fatigue: yes Sick contacts: no Strep contacts: no  Context: worse Recurrent sinusitis: no Relief with OTC cold/cough medications: no  Treatments attempted: cold/sinus, mucinex, anti-histamine, pseudoephedrine and cough syrup    Relevant past medical, surgical, family and social history reviewed and updated as indicated. Interim medical history since our last visit reviewed. Allergies and medications reviewed and updated.  Review of Systems  Constitutional: Negative.   HENT: Negative.   Respiratory: Negative.   Cardiovascular: Negative.   Psychiatric/Behavioral: Negative.     Per HPI unless specifically indicated above     Objective:    BP 145/87 mmHg  Pulse 110  Temp(Src) 100.3 F (37.9 C)  SpO2 94%  LMP  (LMP Unknown)  Wt Readings from Last 3 Encounters:  04/29/15 246 lb (111.585 kg)  02/24/15 250 lb (113.399 kg)  02/17/15 248 lb 12.8 oz (112.855 kg)    Physical Exam  Constitutional: She is oriented to person, place, and time. She appears  well-developed and well-nourished. No distress.  HENT:  Head: Normocephalic and atraumatic.  Right Ear: Hearing and external ear normal.  Left Ear: Hearing and external ear normal.  Nose: Nose normal.  Mouth/Throat: Oropharynx is clear and moist. No oropharyngeal exudate.  Eyes: Conjunctivae, EOM and lids are normal. Pupils are equal, round, and reactive to light. Right eye exhibits no discharge. Left eye exhibits no discharge. No scleral icterus.  Neck: Normal range of motion. Neck supple. No JVD present. No tracheal deviation present. No thyromegaly present.  Cardiovascular: Normal rate, regular rhythm, normal heart sounds and intact distal pulses.  Exam reveals no gallop and no friction rub.   No murmur heard. Pulmonary/Chest: Effort normal. No stridor. No respiratory distress. She has wheezes in the right upper field, the right middle field, the right lower field, the left upper field, the left middle field and the left lower field. She has rhonchi in the right upper field, the right middle field and the right lower field. She has no rales. She exhibits no tenderness.  Musculoskeletal: Normal range of motion.  Lymphadenopathy:    She has cervical adenopathy.  Neurological: She is alert and oriented to person, place, and time.  Skin: Skin is intact. No rash noted. She is not diaphoretic.  Psychiatric: She has a normal mood and affect. Her speech is normal and behavior is normal. Judgment and thought content normal. Cognition and memory are normal.  Nursing note and vitals reviewed.   Results for orders placed or performed in visit on 04/29/15  Bayer DCA Hb A1c Waived  Result Value Ref Range   Bayer DCA Hb A1c Waived 8.6 (H) <7.0 %      Assessment & Plan:   Problem List Items Addressed This Visit    None    Visit Diagnoses    Acute bronchitis, unspecified organism    -  Primary    Will treat with z-pack and prednisone, tussionex and tessalon for comfort. Lung recheck in 2 weeks,  call if not getting better or getting worse.     Upper respiratory infection        With body aches and symptoms, will check for flu. Flu negative.     Relevant Medications    azithromycin (ZITHROMAX) 250 MG tablet    Other Relevant Orders    Influenza a and b    Wheezes        Better after neb.     Relevant Medications    albuterol (PROVENTIL) (2.5 MG/3ML) 0.083% nebulizer solution 2.5 mg        Follow up plan: Return in about 2 weeks (around 05/24/2015) for Lung recheck.

## 2015-05-25 ENCOUNTER — Ambulatory Visit (INDEPENDENT_AMBULATORY_CARE_PROVIDER_SITE_OTHER): Payer: 59 | Admitting: Unknown Physician Specialty

## 2015-05-25 ENCOUNTER — Encounter: Payer: Self-pay | Admitting: Unknown Physician Specialty

## 2015-05-25 VITALS — BP 142/99 | HR 73 | Temp 98.5°F | Ht 60.8 in | Wt 244.2 lb

## 2015-05-25 DIAGNOSIS — N200 Calculus of kidney: Secondary | ICD-10-CM | POA: Diagnosis not present

## 2015-05-25 DIAGNOSIS — M545 Low back pain: Secondary | ICD-10-CM

## 2015-05-25 LAB — UA/M W/RFLX CULTURE, ROUTINE
Bilirubin, UA: NEGATIVE
GLUCOSE, UA: NEGATIVE
Ketones, UA: NEGATIVE
Leukocytes, UA: NEGATIVE
Nitrite, UA: NEGATIVE
SPEC GRAV UA: 1.02 (ref 1.005–1.030)
UUROB: 0.2 mg/dL (ref 0.2–1.0)
pH, UA: 5.5 (ref 5.0–7.5)

## 2015-05-25 LAB — MICROSCOPIC EXAMINATION: RBC, UA: >30 /hpf — AB (ref 0–?)

## 2015-05-25 MED ORDER — OXYCODONE-ACETAMINOPHEN 5-325 MG PO TABS: 1.0000 | ORAL_TABLET | ORAL | Status: DC | PRN

## 2015-05-25 NOTE — Progress Notes (Signed)
BP 142/99 mmHg  Pulse 73  Temp(Src) 98.5 F (36.9 C)  Ht 5' 0.8" (1.544 m)  Wt 244 lb 3.2 oz (110.768 kg)  BMI 46.46 kg/m2  SpO2 97%  LMP  (LMP Unknown)   Subjective:    Patient ID: Tricia Hill, female    DOB: [birth year], 44 y.o.   MRN: [id number]  HPI: TAMU MALSAM is a 44 y.o. female  Chief Complaint  Patient presents with  . Flank Pain    pt states she has been having pain in lower back in kidney area. States she was experiencing urinary urgency yesterday but the pain started earlier today.   Back pain Started suddenly at noon today while on the toilet.  States this was sudden 10/10 pain that is now eased a little and is now a 6/10.  This is a nagging sharp pain that is located left lower back at a specific spot.  Uncelar if there is  point tenderness.  Nauseated all week.  About 2 weeks ago had bronchitis, 8 days ago shingles diagnosed at urgent care that did radiated left lower back to left groin.    Relevant past medical, surgical, family and social history reviewed and updated as indicated. Interim medical history since our last visit reviewed. Allergies and medications reviewed and updated.  Review of Systems  Per HPI unless specifically indicated above     Objective:    BP 142/99 mmHg  Pulse 73  Temp(Src) 98.5 F (36.9 C)  Ht 5' 0.8" (1.544 m)  Wt 244 lb 3.2 oz (110.768 kg)  BMI 46.46 kg/m2  SpO2 97%  LMP  (LMP Unknown)  Wt Readings from Last 3 Encounters:  05/25/15 244 lb 3.2 oz (110.768 kg)  04/29/15 246 lb (111.585 kg)  02/24/15 250 lb (113.399 kg)    Physical Exam  Constitutional: She is oriented to person, place, and time. She appears well-developed and well-nourished. No distress.  HENT:  Head: Normocephalic and atraumatic.  Eyes: Conjunctivae and lids are normal. Right eye exhibits no discharge. Left eye exhibits no discharge. No scleral icterus.  Neck: Normal range of motion. Neck supple. No JVD present. Carotid bruit is not present.    Cardiovascular: Normal rate, regular rhythm and normal heart sounds.   Pulmonary/Chest: Effort normal and breath sounds normal.  Abdominal: Normal appearance. There is no splenomegaly or hepatomegaly.  Musculoskeletal: Normal range of motion.       Lumbar back: She exhibits tenderness and pain. She exhibits normal range of motion, no swelling, no edema, no deformity, no laceration and no spasm.       Back:  Neurological: She is alert and oriented to person, place, and time.  Skin: Skin is warm, dry and intact. No rash noted. No pallor.  Psychiatric: She has a normal mood and affect. Her behavior is normal. Judgment and thought content normal.   Urine with >30 RBCs     Assessment & Plan:   Problem List Items Addressed This Visit    None    Visit Diagnoses    Low back pain without sciatica, unspecified back pain laterality    -  Primary    Relevant Medications    oxyCODONE-acetaminophen (PERCOCET/ROXICET) 5-325 MG tablet    Other Relevant Orders    UA/M w/rflx Culture, Routine    Kidney stone        Refer to urology.      Relevant Medications    oxyCODONE-acetaminophen (PERCOCET/ROXICET) 5-325 MG tablet    Other  Relevant Orders    Ambulatory referral to Urology        Follow up plan: Return for appt with urology.  Urgent referral made.

## 2015-05-26 ENCOUNTER — Encounter: Payer: Self-pay | Admitting: Urology

## 2015-05-26 ENCOUNTER — Ambulatory Visit (INDEPENDENT_AMBULATORY_CARE_PROVIDER_SITE_OTHER): Payer: 59 | Admitting: Urology

## 2015-05-26 VITALS — BP 124/79 | HR 80 | Ht 62.0 in | Wt 243.5 lb

## 2015-05-26 DIAGNOSIS — R3129 Other microscopic hematuria: Secondary | ICD-10-CM

## 2015-05-26 DIAGNOSIS — R1012 Left upper quadrant pain: Secondary | ICD-10-CM

## 2015-05-26 DIAGNOSIS — R109 Unspecified abdominal pain: Secondary | ICD-10-CM | POA: Insufficient documentation

## 2015-05-26 LAB — URINALYSIS, COMPLETE
Bilirubin, UA: NEGATIVE
GLUCOSE, UA: NEGATIVE
Ketones, UA: NEGATIVE
LEUKOCYTES UA: NEGATIVE
Nitrite, UA: NEGATIVE
PROTEIN UA: NEGATIVE
RBC, UA: NEGATIVE
Specific Gravity, UA: >1.03 — ABNORMAL HIGH (ref 1.005–1.030)
UUROB: 0.2 mg/dL (ref 0.2–1.0)
pH, UA: 5 (ref 5.0–7.5)

## 2015-05-26 LAB — MICROSCOPIC EXAMINATION

## 2015-05-26 MED ORDER — TAMSULOSIN HCL 0.4 MG PO CAPS: 0.4000 mg | ORAL_CAPSULE | Freq: Every day | ORAL | Status: DC

## 2015-05-26 NOTE — Progress Notes (Signed)
05/26/2015 9:39 AM   Tricia Hill May 01, 1971 [id number]  Referring provider: Valerie Roys, DO [place], [place] [address]  Chief Complaint  Patient presents with  . Nephrolithiasis    referred by Kathrine Haddock [facility]    HPI: Patient is a 44 year old white female who is referred to [place] by Kathrine Haddock, NP for microscopic hematuria and left flank pain.  She states yesterday around lunchtime she had the sudden onset of intense left-sided flank pain. It did not radiate. She had been having intermittent nausea which was continuing with this pain. She states it will so severe she felt like she was going to die.  She took a shower and attempts to ease the pain. It did not provide any relief.   She then contacted her primary care's office and was seen there yesterday.  At that visit, she had greater than 30 RBC's per prior field on her urinalysis.  She was given Percocet, 30 tablets for pain and referred to [place].  Today, she is not experiencing intense flank pain.  She is having a slight tinge.  She does not have a history of stone.  Her maternal aunt has a history of nephrolithiasis.  The patient has noticed an increase in her urination a few days prior to the onset of the flank pain.  She has not had gross hematuria, but she did notice a small clot when on the tissue after wiping herself.    She is not experiencing any associated fevers, chills or vomiting.    PMH: Past Medical History  Diagnosis Date  . Sleep apnea   . Diabetes mellitus without complication ([place])   . Shingles   . Heart murmur     child    Surgical History: Past Surgical History  Procedure Laterality Date  . Cesarean section      X 2  . Spinal fusion  2012    C3 and C4  . Abdominal hysterectomy      Partial    Home Medications:    Medication List       This list is accurate as of: 05/26/15  9:39 AM.  Always use your most recent med list.               Dulaglutide 0.75  MG/0.5ML Sopn  Commonly known as:  TRULICITY  Inject 3.89 mg into the skin once a week.     freestyle lancets  Use as instructed     glucose blood test strip  Commonly known as:  FREESTYLE LITE  Use as instructed     glucose monitoring kit monitoring kit  1 each by Does not apply route as needed for other.     metFORMIN 1000 MG tablet  Commonly known as:  GLUCOPHAGE  Take 1/2 tab BID for first week, then increase to 1 tab qAM and 1/2 tab qPM for 1 week, then increase to 1 tab BID and stay there     oxyCODONE-acetaminophen 5-325 MG tablet  Commonly known as:  PERCOCET/ROXICET  Take 1 tablet by mouth every 4 (four) hours as needed for severe pain.     PROAIR HFA 108 (90 BASE) MCG/ACT inhaler  Generic drug:  albuterol  Inhale 2 puffs into the lungs every 4 (four) hours as needed.     tamsulosin 0.4 MG Caps capsule  Commonly known as:  FLOMAX  Take 1 capsule (0.4 mg total) by mouth daily.  Allergies: No Known Allergies  Family History: Family History  Problem Relation Age of Onset  . Hypertension Mother   . Diabetes Mother   . Hypertension Father   . Graves' disease Sister   . Clotting disorder Brother   . Heart disease Maternal Grandmother   . Heart disease Maternal Grandfather   . Kidney disease Neg Hx     Social History:  reports that she has never smoked. She has never used smokeless tobacco. She reports that she drinks alcohol. She reports that she does not use illicit drugs.  ROS: UROLOGY Frequent Urination?: Yes Hard to postpone urination?: No Burning/pain with urination?: No Get up at night to urinate?: No Leakage of urine?: No Urine stream starts and stops?: No Trouble starting stream?: No Do you have to strain to urinate?: No Blood in urine?: Yes Urinary tract infection?: No Sexually transmitted disease?: No Injury to kidneys or bladder?: No Painful intercourse?: Yes Weak stream?: No Currently pregnant?: No Vaginal bleeding?: No Last  menstrual period?: n  Gastrointestinal Nausea?: Yes Vomiting?: No Indigestion/heartburn?: Yes Diarrhea?: No Constipation?: No  Constitutional Fever: No Night sweats?: No Weight loss?: No Fatigue?: No  Skin Skin rash/lesions?: Yes Itching?: Yes  Eyes Blurred vision?: No Double vision?: No  Ears/Nose/Throat Sore throat?: No Sinus problems?: No  Hematologic/Lymphatic Swollen glands?: No Easy bruising?: No  Cardiovascular Leg swelling?: No Chest pain?: No  Respiratory Cough?: Yes Shortness of breath?: No  Endocrine Excessive thirst?: No  Musculoskeletal Back pain?: Yes Joint pain?: No  Neurological Headaches?: No Dizziness?: No  Psychologic Depression?: No Anxiety?: No  Physical Exam: BP 124/79 mmHg  Pulse 80  Ht 5' 2"  (1.575 m)  Wt 243 lb 8 oz (110.451 kg)  BMI 44.53 kg/m2  LMP  (LMP Unknown)  Constitutional: Well nourished. Alert and oriented, No acute distress. HEENT: [place] AT, moist mucus membranes. Trachea midline, no masses. Cardiovascular: No clubbing, cyanosis, or edema. Respiratory: Normal respiratory effort, no increased work of breathing. GI: Abdomen is soft, non tender, non distended, no abdominal masses. Liver and spleen not palpable.  No hernias appreciated.  Stool sample for occult testing is not indicated.   GU: No CVA tenderness.  No bladder fullness or masses.   Skin: No rashes, bruises or suspicious lesions. Lymph: No cervical or inguinal adenopathy. Neurologic: Grossly intact, no focal deficits, moving all 4 extremities. Psychiatric: Normal mood and affect.  Laboratory Data: Lab Results  Component Value Date   WBC 8.1 01/31/2015   HGB 13.1 09/04/2010   HCT 39.9 01/31/2015   MCV 84.2 09/04/2010   PLT 298 09/04/2010    Lab Results  Component Value Date   CREATININE 0.51* 01/31/2015    Urinalysis Results for orders placed or performed in visit on 05/25/15  Microscopic Examination  Result Value Ref Range   WBC, UA  0-5 0 -  5 /hpf   RBC, UA >30 (A) 0 -  2 /hpf   Epithelial Cells (non renal) 0-10 0 - 10 /hpf   Mucus, UA Present Not Estab.   Bacteria, UA Few None seen/Few  UA/M w/rflx Culture, Routine  Result Value Ref Range   Specific Gravity, UA 1.020 1.005 - 1.030   pH, UA 5.5 5.0 - 7.5   Color, UA Yellow Yellow   Appearance Ur Cloudy (A) Clear   Leukocytes, UA Negative Negative   Protein, UA Trace Negative/Trace   Glucose, UA Negative Negative   Ketones, UA Negative Negative   RBC, UA 3+ (A) Negative   Bilirubin,  UA Negative Negative   Urobilinogen, Ur 0.2 0.2 - 1.0 mg/dL   Nitrite, UA Negative Negative   Microscopic Examination See below:     Assessment & Plan:    1. Microscopic hematuria:   Explained to patient the causes of blood in the urine are as follows: stones, UTI's, damage to the urinary tract and/or cancer.  I stated that her symptomatolgy sounded more like a stone.  It is explained to the patient that they will be scheduled for a CT Urogram with contrast material and that in rare instances, an allergic reaction can be serious and even life threatening with the injection of contrast material.   The patient denies any allergies to contrast, iodine and/or seafood and is taking metformin.  - Urinalysis, Complete - CULTURE, URINE COMPREHENSIVE  2. Left flank pain:   Patient will be undergoing a CT Urogram.  I have prescribed her tamsulosin 0.4 mg daily and have given her a strainer.  I have instructed her to save any fragments she may pass, so we can send them for analysis.  If the pain becomes intractable or if she should experience fevers, chills or intractable nausea, she needs to contact our office immediately or go to the ED.    - BUN + serum creatinine  Return in about 1 week (around 06/02/2015) for CT Urogram report.  Zara Council, [facility] [address], [place],  [address] [phone]

## 2015-05-27 LAB — BUN+CREAT
BUN / CREAT RATIO: 12 (ref 9–23)
BUN: 7 mg/dL (ref 6–24)
Creatinine, Ser: 0.57 mg/dL (ref 0.57–1.00)
GFR calc non Af Amer: 115 mL/min/{1.73_m2} (ref >59)
GFR, EST AFRICAN AMERICAN: 132 mL/min/{1.73_m2} (ref >59)

## 2015-05-28 LAB — CULTURE, URINE COMPREHENSIVE

## 2015-06-03 ENCOUNTER — Ambulatory Visit
Admission: RE | Admit: 2015-06-03 | Discharge: 2015-06-03 | Disposition: A | Discharge status: Home / Self Care | Payer: 59 | Source: Ambulatory Visit | Attending: Urology | Admitting: Urology

## 2015-06-03 DIAGNOSIS — R109 Unspecified abdominal pain: Secondary | ICD-10-CM | POA: Diagnosis not present

## 2015-06-03 DIAGNOSIS — K760 Fatty (change of) liver, not elsewhere classified: Secondary | ICD-10-CM | POA: Insufficient documentation

## 2015-06-03 DIAGNOSIS — R3129 Other microscopic hematuria: Secondary | ICD-10-CM | POA: Diagnosis present

## 2015-06-03 MED ORDER — IOHEXOL 300 MG/ML  SOLN
125.0000 mL | Freq: Once | INTRAMUSCULAR | Status: AC | PRN
Start: 2015-06-03 — End: 2015-06-03
  Administered 2015-06-03: 125 mL via INTRAVENOUS

## 2015-06-07 ENCOUNTER — Ambulatory Visit: Payer: 59 | Admitting: Obstetrics and Gynecology

## 2015-06-09 ENCOUNTER — Ambulatory Visit (INDEPENDENT_AMBULATORY_CARE_PROVIDER_SITE_OTHER): Payer: 59 | Admitting: Obstetrics and Gynecology

## 2015-06-09 ENCOUNTER — Encounter: Payer: Self-pay | Admitting: Obstetrics and Gynecology

## 2015-06-09 VITALS — BP 133/85 | HR 72 | Resp 16 | Ht 62.0 in | Wt 242.0 lb

## 2015-06-09 DIAGNOSIS — R319 Hematuria, unspecified: Secondary | ICD-10-CM | POA: Diagnosis not present

## 2015-06-09 LAB — URINALYSIS, COMPLETE
Bilirubin, UA: NEGATIVE
GLUCOSE, UA: NEGATIVE
Ketones, UA: NEGATIVE
Leukocytes, UA: NEGATIVE
NITRITE UA: NEGATIVE
RBC UA: NEGATIVE
Specific Gravity, UA: 1.025 (ref 1.005–1.030)
Urobilinogen, Ur: 0.2 mg/dL (ref 0.2–1.0)
pH, UA: 5.5 (ref 5.0–7.5)

## 2015-06-09 LAB — MICROSCOPIC EXAMINATION

## 2015-06-09 NOTE — Patient Instructions (Signed)
Dietary Guidelines to Help Prevent Kidney Stones °Your risk of kidney stones can be decreased by adjusting the foods you eat. The most important thing you can do is drink enough fluid. You should drink enough fluid to keep your urine clear or pale yellow. The following guidelines provide specific information for the type of kidney stone you have had. °GUIDELINES ACCORDING TO TYPE OF KIDNEY STONE °Calcium Oxalate Kidney Stones °· Reduce the amount of salt you eat. Foods that have a lot of salt cause your body to release excess calcium into your urine. The excess calcium can combine with a substance called oxalate to form kidney stones. °· Reduce the amount of animal protein you eat if the amount you eat is excessive. Animal protein causes your body to release excess calcium into your urine. Ask your dietitian how much protein from animal sources you should be eating. °· Avoid foods that are high in oxalates. If you take vitamins, they should have less than 500 mg of vitamin C. Your body turns vitamin C into oxalates. You do not need to avoid fruits and vegetables high in vitamin C. °Calcium Phosphate Kidney Stones °· Reduce the amount of salt you eat to help prevent the release of excess calcium into your urine. °· Reduce the amount of animal protein you eat if the amount you eat is excessive. Animal protein causes your body to release excess calcium into your urine. Ask your dietitian how much protein from animal sources you should be eating. °· Get enough calcium from food or take a calcium supplement (ask your dietitian for recommendations). Food sources of calcium that do not increase your risk of kidney stones include: °· Broccoli. °· Dairy products, such as cheese and yogurt. °· Pudding. °Uric Acid Kidney Stones °· Do not have more than 6 oz of animal protein per day. °FOOD SOURCES °Animal Protein Sources °· Meat (all types). °· Poultry. °· Eggs. °· Fish, seafood. °Foods High in Salt °· Salt seasonings. °· Soy  sauce. °· Teriyaki sauce. °· Cured and processed meats. °· Salted crackers and snack foods. °· Fast food. °· Canned soups and most canned foods. °Foods High in Oxalates °· Grains: °· Amaranth. °· Barley. °· Grits. °· Wheat germ. °· Bran. °· Buckwheat flour. °· All bran cereals. °· Pretzels. °· Whole wheat bread. °· Vegetables: °· Beans (wax). °· Beets and beet greens. °· Collard greens. °· Eggplant. °· Escarole. °· Leeks. °· Okra. °· Parsley. °· Rutabagas. °· Spinach. °· Swiss chard. °· Tomato paste. °· Fried potatoes. °· Sweet potatoes. °· Fruits: °· Red currants. °· Figs. °· Kiwi. °· Rhubarb. °· Meat and Other Protein Sources: °· Beans (dried). °· Soy burgers and other soybean products. °· Miso. °· Nuts (peanuts, almonds, pecans, cashews, hazelnuts). °· Nut butters. °· Sesame seeds and tahini (paste made of sesame seeds). °· Poppy seeds. °· Beverages: °· Chocolate drink mixes. °· Soy milk. °· Instant iced tea. °· Juices made from high-oxalate fruits or vegetables. °· Other: °· Carob. °· Chocolate. °· Fruitcake. °· Marmalades. °  °This information is not intended to replace advice given to you by your health care provider. Make sure you discuss any questions you have with your health care provider. °  °Document Released: 09/22/2010 Document Revised: 06/02/2013 Document Reviewed: 04/24/2013 °Elsevier Interactive Patient Education ©2016 [employer] ° °Kidney Stones °Kidney stones (urolithiasis) are deposits that form inside your kidneys. The intense pain is caused by the stone moving through the urinary tract. When the stone moves, the ureter   goes into spasm around the stone. The stone is usually passed in the urine.  °CAUSES  °· A disorder that makes certain neck glands produce too much parathyroid hormone (primary hyperparathyroidism). °· A buildup of uric acid crystals, similar to gout in your joints. °· Narrowing (stricture) of the ureter. °· A kidney obstruction present at birth (congenital  obstruction). °· Previous surgery on the kidney or ureters. °· Numerous kidney infections. °SYMPTOMS  °· Feeling sick to your stomach (nauseous). °· Throwing up (vomiting). °· Blood in the urine (hematuria). °· Pain that usually spreads (radiates) to the groin. °· Frequency or urgency of urination. °DIAGNOSIS  °· Taking a history and physical exam. °· Blood or urine tests. °· CT scan. °· Occasionally, an examination of the inside of the urinary bladder (cystoscopy) is performed. °TREATMENT  °· Observation. °· Increasing your fluid intake. °· Extracorporeal shock wave lithotripsy--This is a noninvasive procedure that uses shock waves to break up kidney stones. °· Surgery may be needed if you have severe pain or persistent obstruction. There are various surgical procedures. Most of the procedures are performed with the use of small instruments. Only small incisions are needed to accommodate these instruments, so recovery time is minimized. °The size, location, and chemical composition are all important variables that will determine the proper choice of action for you. Talk to your health care provider to better understand your situation so that you will minimize the risk of injury to yourself and your kidney.  °HOME CARE INSTRUCTIONS  °· Drink enough water and fluids to keep your urine clear or pale yellow. This will help you to pass the stone or stone fragments. °· Strain all urine through the provided strainer. Keep all particulate matter and stones for your health care provider to see. The stone causing the pain may be as small as a grain of salt. It is very important to use the strainer each and every time you pass your urine. The collection of your stone will allow your health care provider to analyze it and verify that a stone has actually passed. The stone analysis will often identify what you can do to reduce the incidence of recurrences. °· Only take over-the-counter or prescription medicines for pain,  discomfort, or fever as directed by your health care provider. °· Keep all follow-up visits as told by your health care provider. This is important. °· Get follow-up X-rays if required. The absence of pain does not always mean that the stone has passed. It may have only stopped moving. If the urine remains completely obstructed, it can cause loss of kidney function or even complete destruction of the kidney. It is your responsibility to make sure X-rays and follow-ups are completed. Ultrasounds of the kidney can show blockages and the status of the kidney. Ultrasounds are not associated with any radiation and can be performed easily in a matter of minutes. °· Make changes to your daily diet as told by your health care provider. You may be told to: °¨ Limit the amount of salt that you eat. °¨ Eat 5 or more servings of fruits and vegetables each day. °¨ Limit the amount of meat, poultry, fish, and eggs that you eat. °· Collect a 24-hour urine sample as told by your health care provider. You may need to collect another urine sample every 6-12 months. °SEEK MEDICAL CARE IF: °· You experience pain that is progressive and unresponsive to any pain medicine you have been prescribed. °SEEK IMMEDIATE MEDICAL CARE IF:  °· Pain   cannot be controlled with the prescribed medicine. °· You have a fever or shaking chills. °· The severity or intensity of pain increases over 18 hours and is not relieved by pain medicine. °· You develop a new onset of abdominal pain. °· You feel faint or pass out. °· You are unable to urinate. °  °This information is not intended to replace advice given to you by your health care provider. Make sure you discuss any questions you have with your health care provider. °  °Document Released: 05/28/2005 Document Revised: 02/16/2015 Document Reviewed: 10/29/2012 °Elsevier Interactive Patient Education ©2016 [employer] °Hematuria, Adult °Hematuria is blood in your urine. It can be caused by a bladder  infection, kidney infection, prostate infection, kidney stone, or cancer of your urinary tract. Infections can usually be treated with medicine, and a kidney stone usually will pass through your urine. If neither of these is the cause of your hematuria, further workup to find out the reason may be needed. °It is very important that you tell your health care provider about any blood you see in your urine, even if the blood stops without treatment or happens without causing pain. Blood in your urine that happens and then stops and then happens again can be a symptom of a very serious condition. Also, pain is not a symptom in the initial stages of many urinary cancers. °HOME CARE INSTRUCTIONS  °· Drink lots of fluid, 3-4 quarts a day. If you have been diagnosed with an infection, cranberry juice is especially recommended, in addition to large amounts of water. °· Avoid caffeine, tea, and carbonated beverages because they tend to irritate the bladder. °· Avoid alcohol because it may irritate the prostate. °· Take all medicines as directed by your health care provider. °· If you were prescribed an antibiotic medicine, finish it all even if you start to feel better. °· If you have been diagnosed with a kidney stone, follow your health care provider's instructions regarding straining your urine to catch the stone. °· Empty your bladder often. Avoid holding urine for long periods of time. °· After a bowel movement, women should cleanse front to back. Use each tissue only once. °· Empty your bladder before and after sexual intercourse if you are a female. °SEEK MEDICAL CARE IF: °· You develop back pain. °· You have a fever. °· You have a feeling of sickness in your stomach (nausea) or vomiting. °· Your symptoms are not better in 3 days. Return sooner if you are getting worse. °SEEK IMMEDIATE MEDICAL CARE IF:  °· You develop severe vomiting and are unable to keep the medicine down. °· You develop severe back or abdominal pain  despite taking your medicines. °· You begin passing a large amount of blood or clots in your urine. °· You feel extremely weak or faint, or you pass out. °MAKE SURE YOU:  °· Understand these instructions. °· Will watch your condition. °· Will get help right away if you are not doing well or get worse. °  °This information is not intended to replace advice given to you by your health care provider. Make sure you discuss any questions you have with your health care provider. °  °Document Released: 05/28/2005 Document Revised: 06/18/2014 Document Reviewed: 01/26/2013 °Elsevier Interactive Patient Education ©2016 [employer]. ° °

## 2015-06-09 NOTE — Progress Notes (Signed)
1:46 PM   Tricia Hill December 12, 1970 [id number]  Referring provider: Valerie Roys, DO [place], [place] [address]  Chief Complaint  Patient presents with  . Results    CT  . Flank Pain  . Hematuria    HPI: Patient is a 44 year old white female who is referred to [place] by Kathrine Haddock, NP for microscopic hematuria and left flank pain.  She states yesterday around lunchtime she had the sudden onset of intense left-sided flank pain. It did not radiate. She had been having intermittent nausea which was continuing with this pain. She states it will so severe she felt like she was going to die.  She took a shower and attempts to ease the pain. It did not provide any relief.   She then contacted her primary care's office and was seen there yesterday.  At that visit, she had greater than 30 RBC's per prior field on her urinalysis.  She was given Percocet, 30 tablets for pain and referred to [place].  Today, she is not experiencing intense flank pain.  She is having a slight tinge.  She does not have a history of stone.  Her maternal aunt has a history of nephrolithiasis.  The patient has noticed an increase in her urination a few days prior to the onset of the flank pain.  She has not had gross hematuria, but she did notice a small clot when on the tissue after wiping herself.    She is not experiencing any associated fevers, chills or vomiting.    Current Status: Patient presents today to review her CT urogram results. She reports no further flank pain or urinary tract symptoms. Her UA was negative. Her last visit. Here to review CT results. She has been straining her urine but has not noticed any stones pass.   CT Urogram IMPRESSION: 1. No acute findings and no explanation for patient's left flank pain and hematuria. 2. Hepatic steatosis.  PMH: Past Medical History  Diagnosis Date  . Sleep apnea   . Diabetes mellitus without complication ([place])   . Shingles   . Heart murmur    child    Surgical History: Past Surgical History  Procedure Laterality Date  . Cesarean section      X 2  . Spinal fusion  2012    C3 and C4  . Abdominal hysterectomy      Partial    Home Medications:    Medication List       This list is accurate as of: 06/09/15  1:46 PM.  Always use your most recent med list.               Dulaglutide 0.75 MG/0.5ML Sopn  Commonly known as:  TRULICITY  Inject 7.58 mg into the skin once a week.     freestyle lancets  Use as instructed     glucose blood test strip  Commonly known as:  FREESTYLE LITE  Use as instructed     glucose monitoring kit monitoring kit  1 each by Does not apply route as needed for other.     metFORMIN 1000 MG tablet  Commonly known as:  GLUCOPHAGE  Take 1/2 tab BID for first week, then increase to 1 tab qAM and 1/2 tab qPM for 1 week, then increase to 1 tab BID and stay there     PROAIR HFA 108 (90 Base) MCG/ACT inhaler  Generic drug:  albuterol  Inhale 2 puffs  into the lungs every 4 (four) hours as needed.        Allergies: No Known Allergies  Family History: Family History  Problem Relation Age of Onset  . Hypertension Mother   . Diabetes Mother   . Hypertension Father   . Graves' disease Sister   . Clotting disorder Brother   . Heart disease Maternal Grandmother   . Heart disease Maternal Grandfather   . Kidney disease Neg Hx     Social History:  reports that she has never smoked. She has never used smokeless tobacco. She reports that she drinks alcohol. She reports that she does not use illicit drugs.  ROS: UROLOGY Frequent Urination?: No Hard to postpone urination?: No Burning/pain with urination?: No Get up at night to urinate?: No Leakage of urine?: No Urine stream starts and stops?: No Trouble starting stream?: No Do you have to strain to urinate?: No Blood in urine?: No Urinary tract infection?: No Sexually transmitted disease?: No Injury to kidneys or bladder?:  No Painful intercourse?: No Weak stream?: No Currently pregnant?: No Vaginal bleeding?: No Last menstrual period?: n  Gastrointestinal Nausea?: No Vomiting?: No Indigestion/heartburn?: No Diarrhea?: No Constipation?: No  Constitutional Fever: No Night sweats?: No Weight loss?: No Fatigue?: No  Skin Skin rash/lesions?: No Itching?: No  Eyes Blurred vision?: No Double vision?: No  Ears/Nose/Throat Sore throat?: No Sinus problems?: No  Hematologic/Lymphatic Swollen glands?: No Easy bruising?: No  Cardiovascular Leg swelling?: No Chest pain?: No  Respiratory Cough?: No Shortness of breath?: No  Endocrine Excessive thirst?: No  Musculoskeletal Back pain?: No Joint pain?: No  Neurological Headaches?: No Dizziness?: No  Psychologic Depression?: No Anxiety?: No  Physical Exam: BP 133/85 mmHg  Pulse 72  Resp 16  Ht 5' 2"  (1.575 m)  Wt 242 lb (109.77 kg)  BMI 44.25 kg/m2  LMP  (LMP Unknown)  Constitutional: Well nourished. Alert and oriented, No acute distress. HEENT: [place] AT, moist mucus membranes. Trachea midline, no masses. Cardiovascular: No clubbing, cyanosis, or edema. Respiratory: Normal respiratory effort, no increased work of breathing. Skin: No rashes, bruises or suspicious lesions. Lymph: No cervical or inguinal adenopathy. Neurologic: Grossly intact, no focal deficits, moving all 4 extremities. Psychiatric: Normal mood and affect.  Laboratory Data: Lab Results  Component Value Date   WBC 8.1 01/31/2015   HGB 13.1 09/04/2010   HCT 39.9 01/31/2015   MCV 84.2 09/04/2010   PLT 298 09/04/2010    Lab Results  Component Value Date   CREATININE 0.57 05/26/2015    Urinalysis Results for orders placed or performed in visit on 06/09/15  Microscopic Examination  Result Value Ref Range   WBC, UA 0-5 0 -  5 /hpf   RBC, UA 0-2 0 -  2 /hpf   Epithelial Cells (non renal) 0-10 0 - 10 /hpf   Mucus, UA Present (A) Not Estab.   Bacteria,  UA Moderate (A) None seen/Few  Urinalysis, Complete  Result Value Ref Range   Specific Gravity, UA 1.025 1.005 - 1.030   pH, UA 5.5 5.0 - 7.5   Color, UA Yellow Yellow   Appearance Ur Cloudy (A) Clear   Leukocytes, UA Negative Negative   Protein, UA Trace (A) Negative/Trace   Glucose, UA Negative Negative   Ketones, UA Negative Negative   RBC, UA Negative Negative   Bilirubin, UA Negative Negative   Urobilinogen, Ur 0.2 0.2 - 1.0 mg/dL   Nitrite, UA Negative Negative   Microscopic Examination See below:  CLINICAL DATA: Intermittent left flank pain. Microscopic hematuria  EXAM: CT ABDOMEN AND PELVIS WITHOUT AND WITH CONTRAST  TECHNIQUE: Multidetector CT imaging of the abdomen and pelvis was performed following the standard protocol before and following the bolus administration of intravenous contrast.  CONTRAST: 147m OMNIPAQUE IOHEXOL 300 MG/ML SOLN  COMPARISON: 12/25/2008  FINDINGS: Lower chest: No pleural or pericardial effusion. Lung bases appear clear.  Hepatobiliary: Hepatic steatosis noted. No focal liver abnormality identified. Gallbladder appears normal. No biliary dilatation  Pancreas: Normal  Spleen: The spleen appears normal.  Adrenals/Urinary Tract: The adrenal glands are normal. No kidney stones identified. No obstructive uropathy identified. Urinary bladder appears normal. On the delayed images there is symmetric excretion of contrast material from both kidneys. No suspicious filling defects identified within the collecting systems, ureters or urinary bladder.  Stomach/Bowel: The stomach is normal. The small bowel loops have a normal course and caliber without obstruction. No pathologic dilatation of the large bowel loops.  Vascular/Lymphatic: Normal appearance of the abdominal aorta. No enlarged retroperitoneal or mesenteric adenopathy. No enlarged pelvic or inguinal lymph nodes.  Reproductive: Previous hysterectomy. No  adnexal mass noted.  Other: There is no ascites or focal fluid collections within the abdomen or pelvis.  Musculoskeletal: No aggressive lytic or sclerotic bone lesions.  IMPRESSION: 1. No acute findings and no explanation for patient's left flank pain and hematuria. 2. Hepatic steatosis.   Electronically Signed  By: TKerby MoorsM.D.  On: 06/03/2015 09:57           Vitals     Height Weight BMI (Calculated)    5' 2"  (1.575 m) 242 lb (109.77 kg) 44.4      Interpretation Summary     CLINICAL DATA: Intermittent left flank pain. Microscopic hematuria  EXAM: CT ABDOMEN AND PELVIS WITHOUT AND WITH CONTRAST  TECHNIQUE: Multidetector CT imaging of the abdomen and pelvis was performed following the standard protocol before and following the bolus administration of intravenous contrast.  CONTRAST: 1277mOMNIPAQUE IOHEXOL 300 MG/ML SOLN  COMPARISON: 12/25/2008  FINDINGS: Lower chest: No pleural or pericardial effusion. Lung bases appear clear.  Hepatobiliary: Hepatic steatosis noted. No focal liver abnormality identified. Gallbladder appears normal. No biliary dilatation  Pancreas: Normal  Spleen: The spleen appears normal.  Adrenals/Urinary Tract: The adrenal glands are normal. No kidney stones identified. No obstructive uropathy identified. Urinary bladder appears normal. On the delayed images there is symmetric excretion of contrast material from both kidneys. No suspicious filling defects identified within the collecting systems, ureters or urinary bladder.  Stomach/Bowel: The stomach is normal. The small bowel loops have a normal course and caliber without obstruction. No pathologic dilatation of the large bowel loops.  Vascular/Lymphatic: Normal appearance of the abdominal aorta. No enlarged retroperitoneal or mesenteric adenopathy. No enlarged pelvic or inguinal lymph nodes.  Reproductive: Previous hysterectomy. No  adnexal mass noted.  Other: There is no ascites or focal fluid collections within the abdomen or pelvis.  Musculoskeletal: No aggressive lytic or sclerotic bone lesions.  IMPRESSION: 1. No acute findings and no explanation for patient's left flank pain and hematuria. 2. Hepatic steatosis.   Electronically Signed  By: TaKerby Moors.D.  On: 06/03/2015 09:57    Assessment & Plan:    1. Microscopic hematuria:   Microscopic hematuria resolved.   Negative CT Urogram. Results reviewed and questions answered. We discussed the AUA guidelines recommendations regarding evaualtion of microscopic hematuria. Patient understands risks of possible bladder cancer going undiagnosed though the is at a relatively low risk. She  has never been a smoker.  She would like to defer cystoscopy at this time.  She will f/u in 6 months for repeat UA.  - Urinalysis, Complete  2. Left flank pain:  Resolved.  Return in about 6 months (around 12/08/2015) for microscopic hematuria.  Herbert Moors, [facility] [address], [place], [place] [address] [phone]

## 2015-08-02 ENCOUNTER — Encounter: Payer: Self-pay | Admitting: Family Medicine

## 2015-08-02 ENCOUNTER — Ambulatory Visit (INDEPENDENT_AMBULATORY_CARE_PROVIDER_SITE_OTHER): Payer: 59 | Admitting: Family Medicine

## 2015-08-02 ENCOUNTER — Other Ambulatory Visit: Payer: Self-pay | Admitting: Family Medicine

## 2015-08-02 DIAGNOSIS — IMO0001 Reserved for inherently not codable concepts without codable children: Secondary | ICD-10-CM

## 2015-08-02 DIAGNOSIS — R030 Elevated blood-pressure reading, without diagnosis of hypertension: Secondary | ICD-10-CM

## 2015-08-02 DIAGNOSIS — E1169 Type 2 diabetes mellitus with other specified complication: Secondary | ICD-10-CM

## 2015-08-02 DIAGNOSIS — E1165 Type 2 diabetes mellitus with hyperglycemia: Secondary | ICD-10-CM | POA: Diagnosis not present

## 2015-08-02 DIAGNOSIS — E785 Hyperlipidemia, unspecified: Secondary | ICD-10-CM | POA: Diagnosis not present

## 2015-08-02 LAB — LIPID PANEL PICCOLO, WAIVED
CHOL/HDL RATIO PICCOLO,WAIVE: 3.7 mg/dL
Cholesterol Piccolo, Waived: 183 mg/dL (ref <200)
HDL Chol Piccolo, Waived: 49 mg/dL — ABNORMAL LOW (ref >59)
LDL CHOL CALC PICCOLO WAIVED: 88 mg/dL (ref <100)
TRIGLYCERIDES PICCOLO,WAIVED: 230 mg/dL — AB (ref <150)
VLDL CHOL CALC PICCOLO,WAIVE: 46 mg/dL — AB (ref <30)

## 2015-08-02 LAB — BAYER DCA HB A1C WAIVED: HB A1C (BAYER DCA - WAIVED): 7.1 % — ABNORMAL HIGH (ref <7.0)

## 2015-08-02 NOTE — Assessment & Plan Note (Signed)
Doing much better! A1c down to 7.1. Continue current regimen and we will recheck in 3 months.

## 2015-08-02 NOTE — Progress Notes (Signed)
BP 134/84 mmHg  Pulse 78  Temp(Src) 98.4 F (36.9 C)  Ht 5' 0.75" (1.543 m)  Wt 237 lb 6.4 oz (107.684 kg)  BMI 45.23 kg/m2  SpO2 95%  LMP  (LMP Unknown)   Subjective:    Patient ID: Tricia Hill, female    DOB: [birth year], 45 y.o.   MRN: [id number]  HPI: Tricia Hill is a 45 y.o. female  Chief Complaint  Patient presents with  . Diabetes  . Hyperlipidemia   DIABETES- Was sick in December with bronchitis, then had shingles and a kidney stone Hypoglycemic episodes:no Polydipsia/polyuria: no Visual disturbance: no Chest pain: no Paresthesias: no Glucose Monitoring: yes  Accucheck frequency: Daily  Fasting glucose: 120s  Taking Insulin?: no Blood Pressure Monitoring: not checking Retinal Examination: Up to Date Foot Exam: Up to Date Diabetic Education: Completed Pneumovax: Up to Date Influenza: Up to Date Aspirin: no  HYPERLIPIDEMIA Hyperlipidemia status: Not on any medication, has been working on diet and exercise Satisfied with current treatment?  yes Side effects:  no Past cholesterol meds: None Supplements: none Aspirin:  no Chest pain:  no Coronary artery disease:  no Family history CAD:  yes Family history early CAD:  no  Relevant past medical, surgical, family and social history reviewed and updated as indicated. Interim medical history since our last visit reviewed. Allergies and medications reviewed and updated.  Review of Systems  Constitutional: Negative.   Respiratory: Negative.   Cardiovascular: Negative.   Musculoskeletal: Negative.   Psychiatric/Behavioral: Negative.     Per HPI unless specifically indicated above     Objective:    BP 134/84 mmHg  Pulse 78  Temp(Src) 98.4 F (36.9 C)  Ht 5' 0.75" (1.543 m)  Wt 237 lb 6.4 oz (107.684 kg)  BMI 45.23 kg/m2  SpO2 95%  LMP  (LMP Unknown)  Wt Readings from Last 3 Encounters:  08/02/15 237 lb 6.4 oz (107.684 kg)  06/09/15 242 lb (109.77 kg)  05/26/15 243 lb 8 oz (110.451 kg)     Physical Exam  Constitutional: She is oriented to person, place, and time. She appears well-developed and well-nourished. No distress.  HENT:  Head: Normocephalic and atraumatic.  Right Ear: Hearing and external ear normal.  Left Ear: Hearing and external ear normal.  Nose: Nose normal.  Mouth/Throat: Oropharynx is clear and moist. No oropharyngeal exudate.  Eyes: Conjunctivae, EOM and lids are normal. Pupils are equal, round, and reactive to light. Right eye exhibits no discharge. Left eye exhibits no discharge. No scleral icterus.  Cardiovascular: Normal rate, regular rhythm, normal heart sounds and intact distal pulses.  Exam reveals no gallop and no friction rub.   No murmur heard. Pulmonary/Chest: Effort normal and breath sounds normal. No respiratory distress. She has no wheezes. She has no rales. She exhibits no tenderness.  Musculoskeletal: Normal range of motion.  Neurological: She is alert and oriented to person, place, and time.  Skin: Skin is warm, dry and intact. No rash noted. She is not diaphoretic. No erythema. No pallor.  Psychiatric: She has a normal mood and affect. Her speech is normal and behavior is normal. Judgment and thought content normal. Cognition and memory are normal.  Nursing note and vitals reviewed.   Results for orders placed or performed in visit on 06/09/15  Microscopic Examination  Result Value Ref Range   WBC, UA 0-5 0 -  5 /hpf   RBC, UA 0-2 0 -  2 /hpf   Epithelial Cells (non renal) 0-10  0 - 10 /hpf   Mucus, UA Present (A) Not Estab.   Bacteria, UA Moderate (A) None seen/Few  Urinalysis, Complete  Result Value Ref Range   Specific Gravity, UA 1.025 1.005 - 1.030   pH, UA 5.5 5.0 - 7.5   Color, UA Yellow Yellow   Appearance Ur Cloudy (A) Clear   Leukocytes, UA Negative Negative   Protein, UA Trace (A) Negative/Trace   Glucose, UA Negative Negative   Ketones, UA Negative Negative   RBC, UA Negative Negative   Bilirubin, UA Negative  Negative   Urobilinogen, Ur 0.2 0.2 - 1.0 mg/dL   Nitrite, UA Negative Negative   Microscopic Examination See below:       Assessment & Plan:   Problem List Items Addressed This Visit      Other   Uncontrolled diabetes mellitus ([place])    Doing much better! A1c down to 7.1. Continue current regimen and we will recheck in 3 months.       Elevated BP    Under fair control. Continue to monitor. Continue DASH diet      Hyperlipidemia associated with type 2 diabetes mellitus ([place])    Doing much better. Down to 183! Continue diet and exercise and check again in 6 months. Will hold off medicine at this time with a LDL <100          Follow up plan: Return in about 3 months (around 10/30/2015) for DM visit.

## 2015-08-02 NOTE — Assessment & Plan Note (Signed)
Under fair control. Continue to monitor. Continue DASH diet

## 2015-08-02 NOTE — Assessment & Plan Note (Signed)
Doing much better. Down to 183! Continue diet and exercise and check again in 6 months. Will hold off medicine at this time with a LDL <100

## 2015-08-03 ENCOUNTER — Encounter: Payer: Self-pay | Admitting: Family Medicine

## 2015-08-03 LAB — COMPREHENSIVE METABOLIC PANEL
ALBUMIN: 4 g/dL (ref 3.5–5.5)
ALK PHOS: 90 IU/L (ref 39–117)
ALT: 22 IU/L (ref 0–32)
AST: 16 IU/L (ref 0–40)
Albumin/Globulin Ratio: 1.4 (ref 1.1–2.5)
BUN / CREAT RATIO: 17 (ref 9–23)
BUN: 11 mg/dL (ref 6–24)
Bilirubin Total: <0.2 mg/dL (ref 0.0–1.2)
CO2: 24 mmol/L (ref 18–29)
CREATININE: 0.65 mg/dL (ref 0.57–1.00)
Calcium: 9.2 mg/dL (ref 8.7–10.2)
Chloride: 101 mmol/L (ref 96–106)
GFR, EST AFRICAN AMERICAN: 126 mL/min/{1.73_m2} (ref >59)
GFR, EST NON AFRICAN AMERICAN: 109 mL/min/{1.73_m2} (ref >59)
GLOBULIN, TOTAL: 2.8 g/dL (ref 1.5–4.5)
GLUCOSE: 122 mg/dL — AB (ref 65–99)
Potassium: 4.8 mmol/L (ref 3.5–5.2)
SODIUM: 140 mmol/L (ref 134–144)
TOTAL PROTEIN: 6.8 g/dL (ref 6.0–8.5)

## 2015-08-10 ENCOUNTER — Other Ambulatory Visit: Payer: Self-pay | Admitting: Family Medicine

## 2015-10-07 ENCOUNTER — Other Ambulatory Visit: Payer: Self-pay | Admitting: Family Medicine

## 2015-11-01 ENCOUNTER — Ambulatory Visit: Payer: 59 | Admitting: Family Medicine

## 2015-11-08 ENCOUNTER — Encounter: Payer: Self-pay | Admitting: Family Medicine

## 2015-11-08 ENCOUNTER — Ambulatory Visit (INDEPENDENT_AMBULATORY_CARE_PROVIDER_SITE_OTHER): Payer: 59 | Admitting: Family Medicine

## 2015-11-08 VITALS — BP 138/82 | HR 74 | Temp 98.8°F | Wt 238.0 lb

## 2015-11-08 DIAGNOSIS — E1165 Type 2 diabetes mellitus with hyperglycemia: Secondary | ICD-10-CM

## 2015-11-08 LAB — BAYER DCA HB A1C WAIVED: HB A1C (BAYER DCA - WAIVED): 7.5 % — ABNORMAL HIGH (ref <7.0)

## 2015-11-08 MED ORDER — DULAGLUTIDE 1.5 MG/0.5ML ~~LOC~~ SOAJ: 1.5000 mg | SUBCUTANEOUS | Status: DC

## 2015-11-08 NOTE — Progress Notes (Signed)
BP 138/82 mmHg  Pulse 74  Temp(Src) 98.8 F (37.1 C)  Wt 238 lb (107.956 kg)  SpO2 96%  LMP  (LMP Unknown)   Subjective:    Patient ID: Tricia Hill, female    DOB: 06/22/1970, 45 y.o.   MRN: [id number]  HPI: Tricia Hill is a 45 y.o. female  Chief Complaint  Patient presents with  . Diabetes   DIABETES- has been traveling a whole lot. Has been under a lot of stress. Going through a custody battle for her niece. Hypoglycemic episodes:no Polydipsia/polyuria: no Visual disturbance: no Chest pain: no Paresthesias: no Glucose Monitoring: no Taking Insulin?: no Blood Pressure Monitoring: not checking Retinal Examination: Up to Date Foot Exam: Up to Date Diabetic Education: Completed Pneumovax: Up to Date Influenza: Up to Date Aspirin: yes  Relevant past medical, surgical, family and social history reviewed and updated as indicated. Interim medical history since our last visit reviewed. Allergies and medications reviewed and updated.  Review of Systems  Constitutional: Negative.   Respiratory: Negative.   Cardiovascular: Negative.   Musculoskeletal: Positive for back pain. Negative for myalgias, joint swelling, arthralgias, gait problem, neck pain and neck stiffness.  Psychiatric/Behavioral: Negative.    Per HPI unless specifically indicated above     Objective:    BP 138/82 mmHg  Pulse 74  Temp(Src) 98.8 F (37.1 C)  Wt 238 lb (107.956 kg)  SpO2 96%  LMP  (LMP Unknown)  Wt Readings from Last 3 Encounters:  11/08/15 238 lb (107.956 kg)  08/02/15 237 lb 6.4 oz (107.684 kg)  06/09/15 242 lb (109.77 kg)    Physical Exam  Constitutional: She is oriented to person, place, and time. She appears well-developed and well-nourished. No distress.  HENT:  Head: Normocephalic and atraumatic.  Right Ear: Hearing normal.  Left Ear: Hearing normal.  Nose: Nose normal.  Eyes: Conjunctivae and lids are normal. Right eye exhibits no discharge. Left eye exhibits no  discharge. No scleral icterus.  Cardiovascular: Normal rate, regular rhythm, normal heart sounds and intact distal pulses.  Exam reveals no gallop and no friction rub.   No murmur heard. Pulmonary/Chest: Effort normal and breath sounds normal. No respiratory distress. She has no wheezes. She has no rales. She exhibits no tenderness.  Musculoskeletal: Normal range of motion.  Neurological: She is alert and oriented to person, place, and time.  Skin: Skin is warm, dry and intact. No rash noted. She is not diaphoretic. No erythema. No pallor.  Psychiatric: She has a normal mood and affect. Her speech is normal and behavior is normal. Judgment and thought content normal. Cognition and memory are normal.  Nursing note and vitals reviewed.   Results for orders placed or performed in visit on 08/02/15  Bayer DCA Hb A1c Waived  Result Value Ref Range   Bayer DCA Hb A1c Waived 7.1 (H) <7.0 %  Lipid Panel Piccolo, Waived  Result Value Ref Range   Cholesterol Piccolo, Waived 183 <200 mg/dL   HDL Chol Piccolo, Waived 49 (L) >59 mg/dL   Triglycerides Piccolo,Waived 230 (H) <150 mg/dL   Chol/HDL Ratio Piccolo,Waive 3.7 mg/dL   LDL Chol Calc Piccolo Waived 88 <100 mg/dL   VLDL Chol Calc Piccolo,Waive 46 (H) <30 mg/dL  Comprehensive metabolic panel  Result Value Ref Range   Glucose 122 (H) 65 - 99 mg/dL   BUN 11 6 - 24 mg/dL   Creatinine, Ser 0.65 0.57 - 1.00 mg/dL   GFR calc non Af Amer 109 >59 mL/min/1.73  GFR calc Af Amer 126 >59 mL/min/1.73   BUN/Creatinine Ratio 17 9 - 23   Sodium 140 134 - 144 mmol/L   Potassium 4.8 3.5 - 5.2 mmol/L   Chloride 101 96 - 106 mmol/L   CO2 24 18 - 29 mmol/L   Calcium 9.2 8.7 - 10.2 mg/dL   Total Protein 6.8 6.0 - 8.5 g/dL   Albumin 4.0 3.5 - 5.5 g/dL   Globulin, Total 2.8 1.5 - 4.5 g/dL   Albumin/Globulin Ratio 1.4 1.1 - 2.5   Bilirubin Total <0.2 0.0 - 1.2 mg/dL   Alkaline Phosphatase 90 39 - 117 IU/L   AST 16 0 - 40 IU/L   ALT 22 0 - 32 IU/L       Assessment & Plan:   Problem List Items Addressed This Visit      Other   Uncontrolled diabetes mellitus ([place]) - Primary    Will increase her trulicity to 1.5mg  weekly. Watch stress and diet. Recheck A1c in 3 months.       Relevant Medications   Dulaglutide (TRULICITY) 1.5 0000000 SOPN   Other Relevant Orders   Bayer DCA Hb A1c Waived       Follow up plan: Return in about 3 months (around 02/08/2016).

## 2015-11-08 NOTE — Patient Instructions (Signed)

## 2015-11-08 NOTE — Assessment & Plan Note (Signed)
Will increase her trulicity to 1.5mg  weekly. Watch stress and diet. Recheck A1c in 3 months.

## 2015-12-08 ENCOUNTER — Ambulatory Visit: Payer: 59 | Admitting: Urology

## 2015-12-14 ENCOUNTER — Ambulatory Visit: Payer: 59 | Admitting: Urology

## 2016-01-02 ENCOUNTER — Encounter: Payer: Self-pay | Admitting: Urology

## 2016-01-02 ENCOUNTER — Ambulatory Visit (INDEPENDENT_AMBULATORY_CARE_PROVIDER_SITE_OTHER): Payer: 59 | Admitting: Urology

## 2016-01-02 VITALS — BP 140/89 | HR 88 | Ht 60.0 in | Wt 236.0 lb

## 2016-01-02 DIAGNOSIS — R3129 Other microscopic hematuria: Secondary | ICD-10-CM

## 2016-01-02 DIAGNOSIS — R109 Unspecified abdominal pain: Secondary | ICD-10-CM

## 2016-01-02 LAB — MICROSCOPIC EXAMINATION

## 2016-01-02 LAB — URINALYSIS, COMPLETE
BILIRUBIN UA: NEGATIVE
Glucose, UA: NEGATIVE
KETONES UA: NEGATIVE
Nitrite, UA: NEGATIVE
PROTEIN UA: NEGATIVE
RBC UA: NEGATIVE
SPEC GRAV UA: 1.025 (ref 1.005–1.030)
UUROB: 0.2 mg/dL (ref 0.2–1.0)
pH, UA: 5.5 (ref 5.0–7.5)

## 2016-01-02 NOTE — Progress Notes (Signed)
3:28 PM   Tricia Hill [birth year] [id number]  Referring provider: Valerie Roys, DO [place], [place] [address]  Chief Complaint  Patient presents with  . Hematuria    30month   HPI: Patient is a 45year old Caucasian female who presents today for a 6 month follow up to check for persistent hematuria.  Background history Patient was referred to [facility] for microscopic hematuria and left flank pain.  She experienced the sudden onset of intense left-sided flank pain that did not radiate.  She also had been having intermittent nausea which was continuing with this pain. She stated it will so severe she felt like she was going to die.  Nothing relieved the pain.   She then contacted her primary care's office and had greater than 30 RBC's per prior field on her urinalysis.  Patient's CT Urogram could not find any acute findings and no explanation for the patient's left flank pain.   Her pain abated and follow up UA was negative.  Today, she had a similar episode of ride sided flank pain during a motion picture show.  It lasts for a few second and then it abates.  She describes it as a sharp pain that just grabs you.  It takes her breath away.  She hasn't noticed anything that makes the pain worse or better.  She has not had gross hematuria.  Her UA today is unremarkable.     PMH: Past Medical History:  Diagnosis Date  . Diabetes mellitus without complication ([place]   . Heart murmur    child  . Shingles   . Sleep apnea     Surgical History: Past Surgical History:  Procedure Laterality Date  . ABDOMINAL HYSTERECTOMY     Partial  . CESAREAN SECTION     X 2  . SPINAL FUSION  2012   C3 and C4    Home Medications:    Medication List       Accurate as of 01/02/16  3:28 PM. Always use your most recent med list.          Dulaglutide 1.5 MG/0.5ML Sopn Commonly known as:  TRULICITY Inject 1.5 mg into the skin once a week.   freestyle lancets Use  as instructed   glucose blood test strip Commonly known as:  FREESTYLE LITE Use as instructed   glucose monitoring kit monitoring kit 1 each by Does not apply route as needed for other.   metFORMIN 1000 MG tablet Commonly known as:  GLUCOPHAGE TAKE 1 TABLET BY MOUTH TWICE DAILY   PROAIR HFA 108 (90 Base) MCG/ACT inhaler Generic drug:  albuterol Inhale 2 puffs into the lungs every 4 (four) hours as needed.       Allergies: No Known Allergies  Family History: Family History  Problem Relation Age of Onset  . Hypertension Mother   . Diabetes Mother   . Hypertension Father   . Graves' disease Sister   . Clotting disorder Brother   . Heart disease Maternal Grandmother   . Heart disease Maternal Grandfather   . Kidney disease Neg Hx     Social History:  reports that she has never smoked. She has never used smokeless tobacco. She reports that she drinks alcohol. She reports that she does not use drugs.  ROS: UROLOGY Frequent Urination?: No Hard to postpone urination?: No Burning/pain with urination?: No Get up at night to urinate?: No Leakage of urine?: No  Urine stream starts and stops?: No Trouble starting stream?: No Do you have to strain to urinate?: No Blood in urine?: No Urinary tract infection?: No Sexually transmitted disease?: No Injury to kidneys or bladder?: No Painful intercourse?: No Weak stream?: No Currently pregnant?: No Vaginal bleeding?: No Last menstrual period?: n  Gastrointestinal Nausea?: No Vomiting?: No Indigestion/heartburn?: Yes Diarrhea?: No Constipation?: No  Constitutional Fever: No Night sweats?: No Weight loss?: No Fatigue?: No  Skin Skin rash/lesions?: No Itching?: Yes  Eyes Blurred vision?: No Double vision?: No  Ears/Nose/Throat Sore throat?: No Sinus problems?: No  Hematologic/Lymphatic Swollen glands?: No Easy bruising?: No  Cardiovascular Leg swelling?: No Chest pain?: No  Respiratory Cough?:  No Shortness of breath?: No  Endocrine Excessive thirst?: No  Musculoskeletal Back pain?: No Joint pain?: No  Neurological Headaches?: No Dizziness?: No  Psychologic Depression?: No Anxiety?: No  Physical Exam: BP 140/89   Pulse 88   Ht 5' (1.524 m)   Wt 236 lb (107 kg)   LMP  (LMP Unknown)   BMI 46.09 kg/m   Constitutional: Well nourished. Alert and oriented, No acute distress. HEENT: [place] AT, moist mucus membranes. Trachea midline, no masses. Cardiovascular: No clubbing, cyanosis, or edema. Respiratory: Normal respiratory effort, no increased work of breathing. Skin: No rashes, bruises or suspicious lesions. Lymph: No cervical or inguinal adenopathy. Neurologic: Grossly intact, no focal deficits, moving all 4 extremities. Psychiatric: Normal mood and affect.  Laboratory Data: Lab Results  Component Value Date   WBC 8.1 01/31/2015   HGB 13.1 09/04/2010   HCT 39.9 01/31/2015   MCV 87 01/31/2015   PLT 267 01/31/2015    Lab Results  Component Value Date   CREATININE 0.65 08/02/2015    Urinalysis Results for orders placed or performed in visit on 11/08/15  Bayer DCA Hb A1c Waived  Result Value Ref Range   Bayer DCA Hb A1c Waived 7.5 (H) <7.0 %   CLINICAL DATA: Intermittent left flank pain. Microscopic hematuria  EXAM: CT ABDOMEN AND PELVIS WITHOUT AND WITH CONTRAST  TECHNIQUE: Multidetector CT imaging of the abdomen and pelvis was performed following the standard protocol before and following the bolus administration of intravenous contrast.  CONTRAST: 137m OMNIPAQUE IOHEXOL 300 MG/ML SOLN  COMPARISON: 12/25/2008  FINDINGS: Lower chest: No pleural or pericardial effusion. Lung bases appear clear.  Hepatobiliary: Hepatic steatosis noted. No focal liver abnormality identified. Gallbladder appears normal. No biliary dilatation  Pancreas: Normal  Spleen: The spleen appears normal.  Adrenals/Urinary Tract: The adrenal glands are  normal. No kidney stones identified. No obstructive uropathy identified. Urinary bladder appears normal. On the delayed images there is symmetric excretion of contrast material from both kidneys. No suspicious filling defects identified within the collecting systems, ureters or urinary bladder.  Stomach/Bowel: The stomach is normal. The small bowel loops have a normal course and caliber without obstruction. No pathologic dilatation of the large bowel loops.  Vascular/Lymphatic: Normal appearance of the abdominal aorta. No enlarged retroperitoneal or mesenteric adenopathy. No enlarged pelvic or inguinal lymph nodes.  Reproductive: Previous hysterectomy. No adnexal mass noted.  Other: There is no ascites or focal fluid collections within the abdomen or pelvis.  Musculoskeletal: No aggressive lytic or sclerotic bone lesions.  IMPRESSION: 1. No acute findings and no explanation for patient's left flank pain and hematuria. 2. Hepatic steatosis.   Electronically Signed  By: TKerby MoorsM.D.  On: 06/03/2015 09:57           Vitals     Height Weight  BMI (Calculated)    _0  (1.575 m) 242 lb (109.77 kg) 44.4      Interpretation Summary     CLINICAL DATA: Intermittent left flank pain. Microscopic hematuria  EXAM: CT ABDOMEN AND PELVIS WITHOUT AND WITH CONTRAST  TECHNIQUE: Multidetector CT imaging of the abdomen and pelvis was performed following the standard protocol before and following the bolus administration of intravenous contrast.  CONTRAST: 13m OMNIPAQUE IOHEXOL 300 MG/ML SOLN  COMPARISON: 12/25/2008  FINDINGS: Lower chest: No pleural or pericardial effusion. Lung bases appear clear.  Hepatobiliary: Hepatic steatosis noted. No focal liver abnormality identified. Gallbladder appears normal. No biliary dilatation  Pancreas: Normal  Spleen: The spleen appears normal.  Adrenals/Urinary Tract: The adrenal glands are  normal. No kidney stones identified. No obstructive uropathy identified. Urinary bladder appears normal. On the delayed images there is symmetric excretion of contrast material from both kidneys. No suspicious filling defects identified within the collecting systems, ureters or urinary bladder.  Stomach/Bowel: The stomach is normal. The small bowel loops have a normal course and caliber without obstruction. No pathologic dilatation of the large bowel loops.  Vascular/Lymphatic: Normal appearance of the abdominal aorta. No enlarged retroperitoneal or mesenteric adenopathy. No enlarged pelvic or inguinal lymph nodes.  Reproductive: Previous hysterectomy. No adnexal mass noted.  Other: There is no ascites or focal fluid collections within the abdomen or pelvis.  Musculoskeletal: No aggressive lytic or sclerotic bone lesions.  IMPRESSION: 1. No acute findings and no explanation for patient's left flank pain and hematuria. 2. Hepatic steatosis.   Electronically Signed  By: TKerby MoorsM.D.  On: 06/03/2015 09:57    Assessment & Plan:    1. Microscopic hematuria:   Microscopic hematuria resolved.   Negative CT Urogram. Results reviewed and questions answered. We discussed the AUA guidelines recommendations regarding evaluation of microscopic hematuria. Patient understands risks of possible bladder cancer going undiagnosed though the is at a relatively low risk. She has never been a smoker.  She would like to defer cystoscopy at this time.  Today's UA is negative.     - Urinalysis, Complete  2. Right flank pain:   Most likely musculoskeletal.  Suggest to follow up with PCP or neck surgeon.    Return if symptoms worsen or fail to improve.  SZara Council [facility] [address] [place] [phone]

## 2016-02-07 ENCOUNTER — Other Ambulatory Visit: Payer: Self-pay | Admitting: Family Medicine

## 2016-02-07 DIAGNOSIS — E1165 Type 2 diabetes mellitus with hyperglycemia: Secondary | ICD-10-CM

## 2016-02-08 ENCOUNTER — Encounter: Payer: Self-pay | Admitting: Family Medicine

## 2016-02-08 ENCOUNTER — Ambulatory Visit (INDEPENDENT_AMBULATORY_CARE_PROVIDER_SITE_OTHER): Payer: 59 | Admitting: Family Medicine

## 2016-02-08 VITALS — BP 137/87 | HR 81 | Temp 98.5°F | Ht 60.5 in | Wt 238.2 lb

## 2016-02-08 DIAGNOSIS — E785 Hyperlipidemia, unspecified: Secondary | ICD-10-CM

## 2016-02-08 DIAGNOSIS — E1169 Type 2 diabetes mellitus with other specified complication: Secondary | ICD-10-CM | POA: Diagnosis not present

## 2016-02-08 DIAGNOSIS — E1165 Type 2 diabetes mellitus with hyperglycemia: Secondary | ICD-10-CM | POA: Diagnosis not present

## 2016-02-08 DIAGNOSIS — Z1239 Encounter for other screening for malignant neoplasm of breast: Secondary | ICD-10-CM | POA: Diagnosis not present

## 2016-02-08 LAB — LIPID PANEL PICCOLO, WAIVED
Chol/HDL Ratio Piccolo,Waive: 4 mg/dL
Cholesterol Piccolo, Waived: 187 mg/dL (ref <200)
HDL Chol Piccolo, Waived: 47 mg/dL — ABNORMAL LOW (ref >59)
LDL CHOL CALC PICCOLO WAIVED: 102 mg/dL — AB (ref <100)
Triglycerides Piccolo,Waived: 190 mg/dL — ABNORMAL HIGH (ref <150)
VLDL CHOL CALC PICCOLO,WAIVE: 38 mg/dL — AB (ref <30)

## 2016-02-08 LAB — MICROALBUMIN, URINE WAIVED
CREATININE, URINE WAIVED: 200 mg/dL (ref 10–300)
Microalb, Ur Waived: 30 mg/L — ABNORMAL HIGH (ref 0–19)

## 2016-02-08 LAB — BAYER DCA HB A1C WAIVED: HB A1C (BAYER DCA - WAIVED): 7.3 % — ABNORMAL HIGH (ref <7.0)

## 2016-02-08 MED ORDER — DULAGLUTIDE 1.5 MG/0.5ML ~~LOC~~ SOAJ: 1.5000 mg | SUBCUTANEOUS | 3 refills | Status: DC

## 2016-02-08 MED ORDER — METFORMIN HCL 1000 MG PO TABS
1000.0000 mg | ORAL_TABLET | Freq: Two times a day (BID) | ORAL | 6 refills | Status: DC
Start: 2016-02-08 — End: 2016-05-15

## 2016-02-08 NOTE — Progress Notes (Signed)
BP 137/87 (BP Location: Left Arm, Patient Position: Sitting, Cuff Size: Large)   Pulse 81   Temp 98.5 F (36.9 C)   Ht 5' 0.5" (1.537 m)   Wt 238 lb 3.2 oz (108 kg)   LMP  (LMP Unknown)   SpO2 96%   BMI 45.75 kg/m    Subjective:    Patient ID: Tricia Hill, female    DOB: 11-12-1970, 45 y.o.   MRN: [id number]  HPI: Tricia Hill is a 45 y.o. female  Chief Complaint  Patient presents with  . Diabetes   DIABETES Hypoglycemic episodes:no Polydipsia/polyuria: no Visual disturbance: no Chest pain: no Paresthesias: no Glucose Monitoring: yes  Accucheck frequency: Daily  Fasting glucose: 130s-140s Taking Insulin?: no Blood Pressure Monitoring: not checking Retinal Examination: Up to Date Foot Exam: Done today Diabetic Education: Completed Pneumovax: Up to Date Influenza:  Aspirin: no  HYPERLIPIDEMIA- elevated last visit. Wanted to try diet and exercise. Rechecking levels today.  Relevant past medical, surgical, family and social history reviewed and updated as indicated. Interim medical history since our last visit reviewed. Allergies and medications reviewed and updated.  Review of Systems  Constitutional: Negative.   Respiratory: Negative.   Cardiovascular: Negative.   Psychiatric/Behavioral: Negative.     Per HPI unless specifically indicated above     Objective:    BP 137/87 (BP Location: Left Arm, Patient Position: Sitting, Cuff Size: Large)   Pulse 81   Temp 98.5 F (36.9 C)   Ht 5' 0.5" (1.537 m)   Wt 238 lb 3.2 oz (108 kg)   LMP  (LMP Unknown)   SpO2 96%   BMI 45.75 kg/m   Wt Readings from Last 3 Encounters:  02/08/16 238 lb 3.2 oz (108 kg)  01/02/16 236 lb (107 kg)  11/08/15 238 lb (108 kg)    Physical Exam  Constitutional: She is oriented to person, place, and time. She appears well-developed and well-nourished. No distress.  HENT:  Head: Normocephalic and atraumatic.  Right Ear: Hearing normal.  Left Ear: Hearing normal.    Nose: Nose normal.  Eyes: Conjunctivae and lids are normal. Right eye exhibits no discharge. Left eye exhibits no discharge. No scleral icterus.  Cardiovascular: Normal rate, regular rhythm, normal heart sounds and intact distal pulses.  Exam reveals no gallop and no friction rub.   No murmur heard. Pulmonary/Chest: Effort normal and breath sounds normal. No respiratory distress. She has no wheezes. She has no rales. She exhibits no tenderness.  Musculoskeletal: Normal range of motion.  Neurological: She is alert and oriented to person, place, and time.  Skin: Skin is warm, dry and intact. No rash noted. She is not diaphoretic. No erythema. No pallor.  Psychiatric: She has a normal mood and affect. Her speech is normal and behavior is normal. Judgment and thought content normal. Cognition and memory are normal.  Nursing note and vitals reviewed.   Results for orders placed or performed in visit on 01/02/16  Microscopic Examination  Result Value Ref Range   WBC, UA 0-5 0 - 5 /hpf   RBC, UA 0-2 0 - 2 /hpf   Epithelial Cells (non renal) >10E 0 - 10 /hpf   Mucus, UA Present (A) Not Estab.   Bacteria, UA Moderate (A) None seen/Few  Urinalysis, Complete  Result Value Ref Range   Specific Gravity, UA 1.025 1.005 - 1.030   pH, UA 5.5 5.0 - 7.5   Color, UA Yellow Yellow   Appearance Ur Clear Clear  Leukocytes, UA Trace (A) Negative   Protein, UA Negative Negative/Trace   Glucose, UA Negative Negative   Ketones, UA Negative Negative   RBC, UA Negative Negative   Bilirubin, UA Negative Negative   Urobilinogen, Ur 0.2 0.2 - 1.0 mg/dL   Nitrite, UA Negative Negative   Microscopic Examination See below:       Assessment & Plan:   Problem List Items Addressed This Visit      Other   Uncontrolled diabetes mellitus ([place]) - Primary    A1c down to 7.3. Work on diet and exercise. And recheck in 3 months.       Relevant Medications   metFORMIN (GLUCOPHAGE) 1000 MG tablet   Dulaglutide  (TRULICITY) 1.5 0000000 SOPN   Other Relevant Orders   Lipid Panel [place], Waived   Microalbumin, Urine Waived   Hyperlipidemia associated with type 2 diabetes mellitus ([place])    Rechecking levels today. Significantly improved! Continue to monitor.       Relevant Medications   metFORMIN (GLUCOPHAGE) 1000 MG tablet   Dulaglutide (TRULICITY) 1.5 0000000 SOPN    Other Visit Diagnoses    Screening for breast cancer       Mammogram ordered today   Relevant Orders   MM DIGITAL SCREENING BILATERAL       Follow up plan: Return in about 3 months (around 05/10/2016) for Physical.

## 2016-02-08 NOTE — Assessment & Plan Note (Signed)
A1c down to 7.3. Work on diet and exercise. And recheck in 3 months.

## 2016-02-08 NOTE — Assessment & Plan Note (Addendum)
Rechecking levels today. Significantly improved! Continue to monitor.

## 2016-04-24 ENCOUNTER — Other Ambulatory Visit: Payer: Self-pay | Admitting: Family Medicine

## 2016-05-10 ENCOUNTER — Encounter: Payer: 59 | Admitting: Family Medicine

## 2016-05-15 ENCOUNTER — Encounter: Payer: Self-pay | Admitting: Family Medicine

## 2016-05-15 ENCOUNTER — Ambulatory Visit (INDEPENDENT_AMBULATORY_CARE_PROVIDER_SITE_OTHER): Payer: 59 | Admitting: Family Medicine

## 2016-05-15 VITALS — BP 142/87 | HR 69 | Temp 98.4°F | Ht 60.3 in | Wt 248.6 lb

## 2016-05-15 DIAGNOSIS — E1169 Type 2 diabetes mellitus with other specified complication: Secondary | ICD-10-CM | POA: Diagnosis not present

## 2016-05-15 DIAGNOSIS — R030 Elevated blood-pressure reading, without diagnosis of hypertension: Secondary | ICD-10-CM

## 2016-05-15 DIAGNOSIS — M6289 Other specified disorders of muscle: Secondary | ICD-10-CM

## 2016-05-15 DIAGNOSIS — R3129 Other microscopic hematuria: Secondary | ICD-10-CM

## 2016-05-15 DIAGNOSIS — Z6841 Body Mass Index (BMI) 40.0 and over, adult: Secondary | ICD-10-CM | POA: Diagnosis not present

## 2016-05-15 DIAGNOSIS — E1165 Type 2 diabetes mellitus with hyperglycemia: Secondary | ICD-10-CM

## 2016-05-15 DIAGNOSIS — Z0000 Encounter for general adult medical examination without abnormal findings: Secondary | ICD-10-CM

## 2016-05-15 DIAGNOSIS — Z124 Encounter for screening for malignant neoplasm of cervix: Secondary | ICD-10-CM | POA: Diagnosis not present

## 2016-05-15 DIAGNOSIS — Z0001 Encounter for general adult medical examination with abnormal findings: Secondary | ICD-10-CM

## 2016-05-15 DIAGNOSIS — G4733 Obstructive sleep apnea (adult) (pediatric): Secondary | ICD-10-CM

## 2016-05-15 DIAGNOSIS — E785 Hyperlipidemia, unspecified: Secondary | ICD-10-CM | POA: Diagnosis not present

## 2016-05-15 LAB — MICROALBUMIN, URINE WAIVED
Creatinine, Urine Waived: 100 mg/dL (ref 10–300)
MICROALB, UR WAIVED: 10 mg/L (ref 0–19)
Microalb/Creat Ratio: <30 mg/g (ref <30)

## 2016-05-15 LAB — MICROSCOPIC EXAMINATION: RBC MICROSCOPIC, UA: NONE SEEN /HPF (ref 0–?)

## 2016-05-15 LAB — UA/M W/RFLX CULTURE, ROUTINE
Bilirubin, UA: NEGATIVE
Glucose, UA: NEGATIVE
KETONES UA: NEGATIVE
NITRITE UA: NEGATIVE
PH UA: 5.5 (ref 5.0–7.5)
Protein, UA: NEGATIVE
RBC, UA: NEGATIVE
Specific Gravity, UA: 1.015 (ref 1.005–1.030)
Urobilinogen, Ur: 0.2 mg/dL (ref 0.2–1.0)

## 2016-05-15 LAB — LIPID PANEL PICCOLO, WAIVED
CHOL/HDL RATIO PICCOLO,WAIVE: 4 mg/dL
Cholesterol Piccolo, Waived: 221 mg/dL — ABNORMAL HIGH (ref <200)
HDL CHOL PICCOLO, WAIVED: 56 mg/dL — AB (ref >59)
LDL CHOL CALC PICCOLO WAIVED: 122 mg/dL — AB (ref <100)
TRIGLYCERIDES PICCOLO,WAIVED: 220 mg/dL — AB (ref <150)
VLDL CHOL CALC PICCOLO,WAIVE: 44 mg/dL — AB (ref <30)

## 2016-05-15 LAB — HEMOGLOBIN A1C: Hemoglobin A1C: 7.7

## 2016-05-15 LAB — BAYER DCA HB A1C WAIVED: HB A1C (BAYER DCA - WAIVED): 7.7 % — ABNORMAL HIGH (ref <7.0)

## 2016-05-15 MED ORDER — LIRAGLUTIDE 18 MG/3ML ~~LOC~~ SOPN: PEN_INJECTOR | SUBCUTANEOUS | 3 refills | Status: DC

## 2016-05-15 MED ORDER — FREESTYLE LANCETS MISC: 12 refills | Status: DC

## 2016-05-15 MED ORDER — ATORVASTATIN CALCIUM 40 MG PO TABS: 40.0000 mg | ORAL_TABLET | Freq: Every day | ORAL | 3 refills | Status: DC

## 2016-05-15 MED ORDER — METFORMIN HCL 1000 MG PO TABS: 1000.0000 mg | ORAL_TABLET | Freq: Two times a day (BID) | ORAL | 6 refills | Status: DC

## 2016-05-15 MED ORDER — LISINOPRIL 2.5 MG PO TABS: 2.5000 mg | ORAL_TABLET | Freq: Every day | ORAL | 3 refills | Status: DC

## 2016-05-15 NOTE — Assessment & Plan Note (Signed)
Elevated. Will start atorvastatin. Recheck 3 months at follow up.

## 2016-05-15 NOTE — Assessment & Plan Note (Signed)
A1c up to 7.7. Will change her from trulicity to [place]. Work on diet and exercise and recheck 3 months.

## 2016-05-15 NOTE — Assessment & Plan Note (Signed)
Will start victoza to help with weight loss. Work on diet and exercise. Call with any concerns.

## 2016-05-15 NOTE — Progress Notes (Signed)
BP (!) 142/87 (BP Location: Left Arm, Patient Position: Sitting, Cuff Size: Large)   Pulse 69   Temp 98.4 F (36.9 C)   Ht 5' 0.3" (1.532 m)   Wt 248 lb 9.6 oz (112.8 kg)   LMP  (LMP Unknown)   SpO2 97%   BMI 48.07 kg/m    Subjective:    Patient ID: Tricia Hill, female    DOB: [birth year], 45 y.o.   MRN: [id number]  HPI: [name] is a 45 y.o. female presenting on 05/15/2016 for comprehensive medical examination. Current medical complaints include:  DIABETES Hypoglycemic episodes:no Polydipsia/polyuria: no Visual disturbance: no Chest pain: no Paresthesias: no Glucose Monitoring: yes  Accucheck frequency: Daily  Fasting glucose: 150-180 Taking Insulin?: no Blood Pressure Monitoring: not checking Retinal Examination: Not up to Date Foot Exam: Up to Date Diabetic Education: Completed Pneumovax: Up to Date Influenza: Up to Date Aspirin: no  HYPERLIPIDEMIA Hyperlipidemia status: excellent compliance Satisfied with current treatment?  yes Medication compliance: Not on anything Past cholesterol meds: None Supplements: none Aspirin:  no Chest pain:  no Coronary artery disease:  no  She currently lives with: family Menopausal Symptoms: no  Depression Screen done today and results listed below:  Depression screen [facility] 2/9 11/08/2015 02/08/2015  Decreased Interest 0 0  Down, Depressed, Hopeless 0 0  PHQ - 2 Score 0 0    Past Medical History:  Past Medical History:  Diagnosis Date  . Diabetes mellitus without complication ([place])   . Heart murmur    child  . Shingles   . Sleep apnea     Surgical History:  Past Surgical History:  Procedure Laterality Date  . ABDOMINAL HYSTERECTOMY     Partial  . CESAREAN SECTION     X 2  . SPINAL FUSION  2012   C3 and C4    Medications:  Current Outpatient Prescriptions on File Prior to Visit  Medication Sig  . FREESTYLE LITE test strip USE AS DIRECTED  . glucose monitoring kit (FREESTYLE) monitoring kit 1  each by Does not apply route as needed for other.  Marland Kitchen PROAIR HFA 108 (90 BASE) MCG/ACT inhaler Inhale 2 puffs into the lungs every 4 (four) hours as needed.   No current facility-administered medications on file prior to visit.     Allergies:  No Known Allergies  Social History:  Social History   Social History  . Marital status: Married    Spouse name: N/A  . Number of children: N/A  . Years of education: N/A   Occupational History  . Not on file.   Social History Main Topics  . Smoking status: Never Smoker  . Smokeless tobacco: Never Used  . Alcohol use Yes     Comment: on occasion  . Drug use: No  . Sexual activity: No   Other Topics Concern  . Not on file   Social History Narrative  . No narrative on file   History  Smoking Status  . Never Smoker  Smokeless Tobacco  . Never Used   History  Alcohol Use  . Yes    Comment: on occasion    Family History:  Family History  Problem Relation Age of Onset  . Hypertension Mother   . Diabetes Mother   . Hypertension Father   . Graves' disease Sister   . Clotting disorder Brother   . Heart disease Maternal Grandmother   . Heart disease Maternal Grandfather   . Kidney disease Neg Hx  Past medical history, surgical history, medications, allergies, family history and social history reviewed with patient today and changes made to appropriate areas of the chart.   Review of Systems  Constitutional: Negative.   HENT: Negative.   Eyes: Positive for blurred vision (Seeing eye doctor). Negative for double vision, photophobia, pain, discharge and redness.  Respiratory: Positive for wheezing. Negative for cough, hemoptysis, sputum production and shortness of breath.   Cardiovascular: Negative.   Gastrointestinal: Positive for heartburn. Negative for abdominal pain, blood in stool, constipation, diarrhea, melena, nausea and vomiting.  Genitourinary: Negative.   Musculoskeletal: Negative.   Skin: Positive for rash  (under L breast). Negative for itching.  Neurological: Negative.   Endo/Heme/Allergies: Negative.   Psychiatric/Behavioral: Negative.     All other ROS negative except what is listed above and in the HPI.      Objective:    BP (!) 142/87 (BP Location: Left Arm, Patient Position: Sitting, Cuff Size: Large)   Pulse 69   Temp 98.4 F (36.9 C)   Ht 5' 0.3" (1.532 m)   Wt 248 lb 9.6 oz (112.8 kg)   LMP  (LMP Unknown)   SpO2 97%   BMI 48.07 kg/m   Wt Readings from Last 3 Encounters:  05/15/16 248 lb 9.6 oz (112.8 kg)  02/08/16 238 lb 3.2 oz (108 kg)  01/02/16 236 lb (107 kg)    Physical Exam  Constitutional: She is oriented to person, place, and time. She appears well-developed and well-nourished. No distress.  HENT:  Head: Normocephalic and atraumatic.  Right Ear: Hearing, tympanic membrane, external ear and ear canal normal.  Left Ear: Hearing, tympanic membrane, external ear and ear canal normal.  Nose: Nose normal.  Mouth/Throat: Uvula is midline, oropharynx is clear and moist and mucous membranes are normal. No oropharyngeal exudate.  Eyes: Conjunctivae, EOM and lids are normal. Pupils are equal, round, and reactive to light. Right eye exhibits no discharge. Left eye exhibits no discharge. No scleral icterus.  Neck: Normal range of motion. Neck supple. No JVD present. No tracheal deviation present. No thyromegaly present.  Cardiovascular: Normal rate, regular rhythm, normal heart sounds and intact distal pulses.  Exam reveals no gallop and no friction rub.   No murmur heard. Pulmonary/Chest: Effort normal and breath sounds normal. No stridor. No respiratory distress. She has no wheezes. She has no rales. She exhibits no tenderness. Right breast exhibits no inverted nipple, no mass, no nipple discharge, no skin change and no tenderness. Left breast exhibits no inverted nipple, no mass, no nipple discharge, no skin change and no tenderness. Breasts are symmetrical.  Abdominal:  Soft. Bowel sounds are normal. She exhibits no distension and no mass. There is no tenderness. There is no rebound and no guarding. Hernia confirmed negative in the right inguinal area and confirmed negative in the left inguinal area.  Genitourinary: No labial fusion. There is no rash, tenderness, lesion or injury on the right labia. There is no rash, tenderness, lesion or injury on the left labia. Cervix exhibits no motion tenderness, no discharge and no friability. There is tenderness in the vagina. No erythema or bleeding in the vagina. No foreign body in the vagina. No signs of injury around the vagina. No vaginal discharge found.  Genitourinary Comments: Uterus absent  Musculoskeletal: Normal range of motion. She exhibits no edema, tenderness or deformity.  Lymphadenopathy:    She has no cervical adenopathy.  Neurological: She is alert and oriented to person, place, and time. She has normal  reflexes. She displays normal reflexes. No cranial nerve deficit. She exhibits normal muscle tone. Coordination normal.  Skin: Skin is warm, dry and intact. No rash noted. She is not diaphoretic. No erythema. No pallor.  Psychiatric: She has a normal mood and affect. Her speech is normal and behavior is normal. Judgment and thought content normal. Cognition and memory are normal.  Nursing note and vitals reviewed.   Results for orders placed or performed in visit on 05/15/16  Hemoglobin A1c  Result Value Ref Range   Hemoglobin A1C 7.7       Assessment & Plan:   Problem List Items Addressed This Visit      Respiratory   OSA (obstructive sleep apnea)    Continue CPAP. Stable.         Endocrine   Uncontrolled diabetes mellitus (HCC)    A1c up to 7.7. Will change her from trulicity to [place]. Work on diet and exercise and recheck 3 months.       Relevant Medications   liraglutide 18 MG/3ML SOPN   metFORMIN (GLUCOPHAGE) 1000 MG tablet   atorvastatin (LIPITOR) 40 MG tablet   lisinopril  (ZESTRIL) 2.5 MG tablet   Other Relevant Orders   Bayer DCA Hb A1c Waived   Comprehensive metabolic panel   Microalbumin, Urine Waived   Hyperlipidemia associated with type 2 diabetes mellitus (HCC)    Elevated. Will start atorvastatin. Recheck 3 months at follow up.      Relevant Medications   liraglutide 18 MG/3ML SOPN   metFORMIN (GLUCOPHAGE) 1000 MG tablet   atorvastatin (LIPITOR) 40 MG tablet   lisinopril (ZESTRIL) 2.5 MG tablet   Other Relevant Orders   Comprehensive metabolic panel   Lipid Panel Piccolo, Waived     Genitourinary   Microscopic hematuria    Rechecking UA today. Await results.       Relevant Orders   Comprehensive metabolic panel   UA/M w/rflx Culture, Routine     Other   Elevated BP without diagnosis of hypertension    OK at home. Will add low dose lisinopril for renal protection. Recheck 3 months. Call with any concerns.       Relevant Orders   Comprehensive metabolic panel   Microalbumin, Urine Waived   Morbid obesity ([place])    Will start victoza to help with weight loss. Work on diet and exercise. Call with any concerns.       Relevant Medications   liraglutide 18 MG/3ML SOPN   metFORMIN (GLUCOPHAGE) 1000 MG tablet   Other Relevant Orders   Comprehensive metabolic panel   TSH    Other Visit Diagnoses    Routine general medical examination at a health care facility    -  Primary   Vaccines up to date. Screening labs checked today. Pap done today. Mammogram ordered. Continue to work on diet and exercise.    Relevant Orders   Bayer DCA Hb A1c Waived   CBC with Differential/Platelet   Comprehensive metabolic panel   Lipid Panel Piccolo, Waived   Microalbumin, Urine Waived   TSH   UA/M w/rflx Culture, Routine   Pelvic floor dysfunction       Very tender on pelvic exam, has had pelvic PT in the past- will get her back into it. Referral generated today.   Relevant Orders   Ambulatory referral to Physical Therapy   Screening for cervical  cancer       Pap done today.   Relevant Orders   IGP, Aptima HPV,  rfx 16/18,45       Follow up plan: Return in about 3 months (around 08/13/2016) for DM and cholesterol follow up.   LABORATORY TESTING:  - Pap smear: pap done  IMMUNIZATIONS:   - Tdap: Tetanus vaccination status reviewed: last tetanus booster within 10 years. - Influenza: Up to date - Pneumovax: Up to date  SCREENING: -Mammogram: Ordered today   PATIENT COUNSELING:   Advised to take 1 mg of folate supplement per day if capable of pregnancy.   Sexuality: Discussed sexually transmitted diseases, partner selection, use of condoms, avoidance of unintended pregnancy  and contraceptive alternatives.   Advised to avoid cigarette smoking.  I discussed with the patient that most people either abstain from alcohol or drink within safe limits (<=14/week and <=4 drinks/occasion for males, <=7/weeks and <= 3 drinks/occasion for females) and that the risk for alcohol disorders and other health effects rises proportionally with the number of drinks per week and how often a drinker exceeds daily limits.  Discussed cessation/primary prevention of drug use and availability of treatment for abuse.   Diet: Encouraged to adjust caloric intake to maintain  or achieve ideal body weight, to reduce intake of dietary saturated fat and total fat, to limit sodium intake by avoiding high sodium foods and not adding table salt, and to maintain adequate dietary potassium and calcium preferably from fresh fruits, vegetables, and low-fat dairy products.    stressed the importance of regular exercise  Injury prevention: Discussed safety belts, safety helmets, smoke detector, smoking near bedding or upholstery.   Dental health: Discussed importance of regular tooth brushing, flossing, and dental visits.    NEXT PREVENTATIVE PHYSICAL DUE IN 1 YEAR. Return in about 3 months (around 08/13/2016) for DM and cholesterol follow up.

## 2016-05-15 NOTE — Assessment & Plan Note (Signed)
Rechecking UA today. Await results.  

## 2016-05-15 NOTE — Assessment & Plan Note (Signed)
Continue CPAP.  Stable. ?

## 2016-05-15 NOTE — Assessment & Plan Note (Signed)
OK at home. Will add low dose lisinopril for renal protection. Recheck 3 months. Call with any concerns.

## 2016-05-16 LAB — COMPREHENSIVE METABOLIC PANEL
ALK PHOS: 107 IU/L (ref 39–117)
ALT: 17 IU/L (ref 0–32)
AST: 13 IU/L (ref 0–40)
Albumin/Globulin Ratio: 1.4 (ref 1.2–2.2)
Albumin: 4 g/dL (ref 3.5–5.5)
BUN/Creatinine Ratio: 16 (ref 9–23)
BUN: 9 mg/dL (ref 6–24)
Bilirubin Total: 0.3 mg/dL (ref 0.0–1.2)
CO2: 26 mmol/L (ref 18–29)
CREATININE: 0.56 mg/dL — AB (ref 0.57–1.00)
Calcium: 9.3 mg/dL (ref 8.7–10.2)
Chloride: 102 mmol/L (ref 96–106)
GFR calc Af Amer: 132 mL/min/{1.73_m2} (ref >59)
GFR calc non Af Amer: 115 mL/min/{1.73_m2} (ref >59)
GLUCOSE: 180 mg/dL — AB (ref 65–99)
Globulin, Total: 2.9 g/dL (ref 1.5–4.5)
Potassium: 4.5 mmol/L (ref 3.5–5.2)
Sodium: 144 mmol/L (ref 134–144)
Total Protein: 6.9 g/dL (ref 6.0–8.5)

## 2016-05-16 LAB — CBC WITH DIFFERENTIAL/PLATELET
BASOS ABS: 0 10*3/uL (ref 0.0–0.2)
Basos: 0 %
EOS (ABSOLUTE): 0.2 10*3/uL (ref 0.0–0.4)
Eos: 2 %
Hematocrit: 39.5 % (ref 34.0–46.6)
Hemoglobin: 12.5 g/dL (ref 11.1–15.9)
Immature Grans (Abs): 0 10*3/uL (ref 0.0–0.1)
Immature Granulocytes: 0 %
LYMPHS ABS: 2.9 10*3/uL (ref 0.7–3.1)
Lymphs: 35 %
MCH: 27.3 pg (ref 26.6–33.0)
MCHC: 31.6 g/dL (ref 31.5–35.7)
MCV: 86 fL (ref 79–97)
MONOCYTES: 5 %
MONOS ABS: 0.4 10*3/uL (ref 0.1–0.9)
Neutrophils Absolute: 4.9 10*3/uL (ref 1.4–7.0)
Neutrophils: 58 %
Platelets: 269 10*3/uL (ref 150–379)
RBC: 4.58 x10E6/uL (ref 3.77–5.28)
RDW: 14.5 % (ref 12.3–15.4)
WBC: 8.5 10*3/uL (ref 3.4–10.8)

## 2016-05-16 LAB — TSH: TSH: 2.95 u[IU]/mL (ref 0.450–4.500)

## 2016-05-18 LAB — IGP, APTIMA HPV, RFX 16/18,45
HPV APTIMA: NEGATIVE
PAP SMEAR COMMENT: 0

## 2016-05-21 DIAGNOSIS — H524 Presbyopia: Secondary | ICD-10-CM | POA: Diagnosis not present

## 2016-05-21 LAB — HM DIABETES EYE EXAM

## 2016-05-21 IMAGING — CT CT ABD-PEL WO/W CM
1 of 3 series · 12 of 32 positions shown, 18 images · IV contrast (omnipaque)
Comparison: 12/25/2008

CLINICAL DATA: Intermittent left flank pain.  Microscopic hematuria

EXAM:
CT ABDOMEN AND PELVIS WITHOUT AND WITH CONTRAST
TECHNIQUE: Multidetector CT imaging of the abdomen and pelvis was performed
following the standard protocol before and following the bolus
administration of intravenous contrast.
CONTRAST:  125mL OMNIPAQUE IOHEXOL 300 MG/ML  SOLN

[Series 2: hematuria < 45 wo · axial · 0.96mm/px · z∈[-335,+85]mm · 12 of 100 slices shown, 18 images]
[im 8/100  soft-tissue]
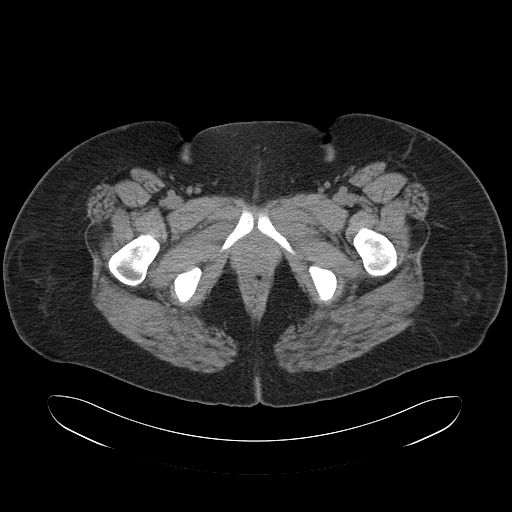
[im 8/100  bone]
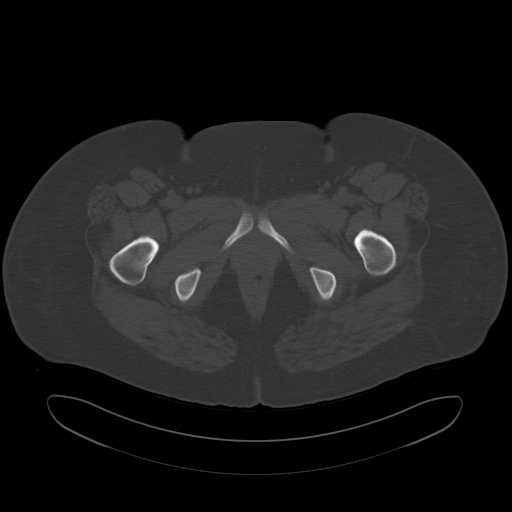
[im 16/100  soft-tissue]
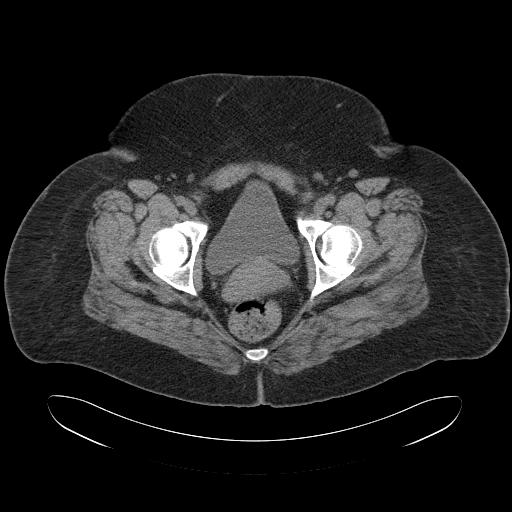
[im 23/100  soft-tissue]
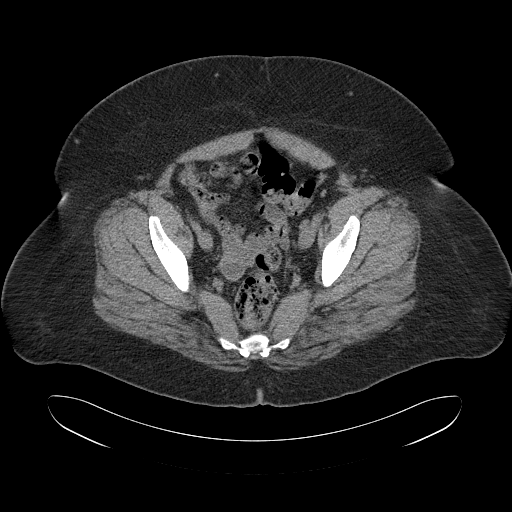
[im 31/100  soft-tissue]
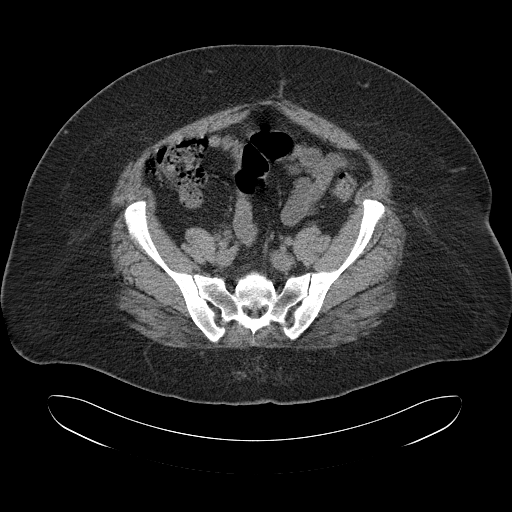
[im 39/100  soft-tissue]
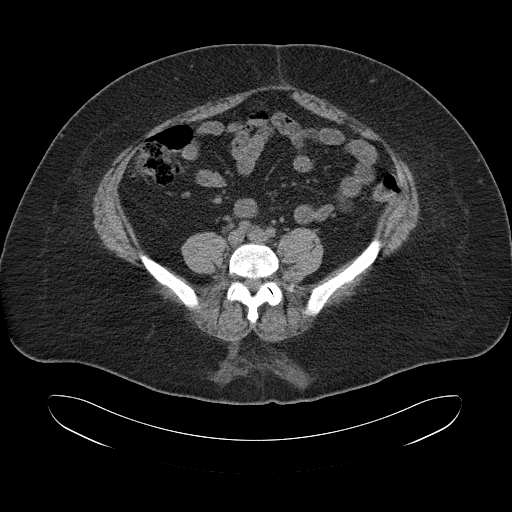
[im 46/100  soft-tissue]
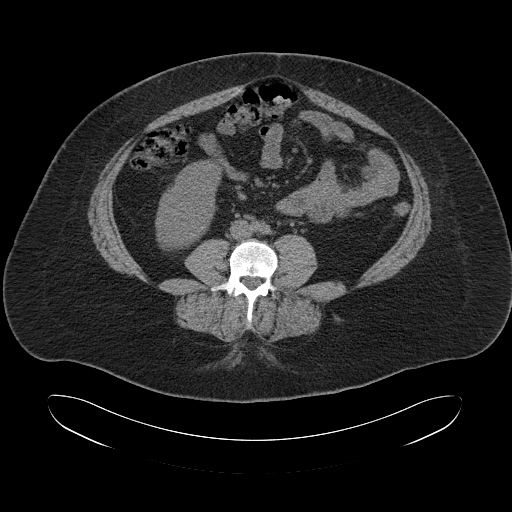
[im 54/100  soft-tissue]
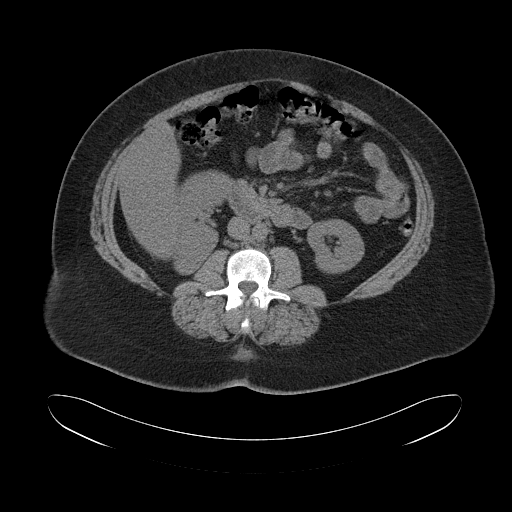
[im 61/100  soft-tissue]
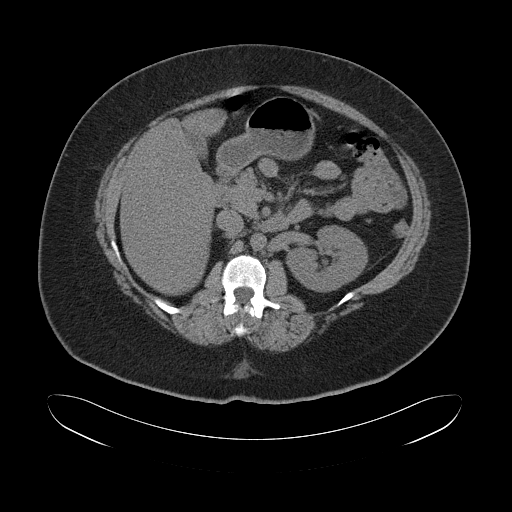
[im 69/100  soft-tissue]
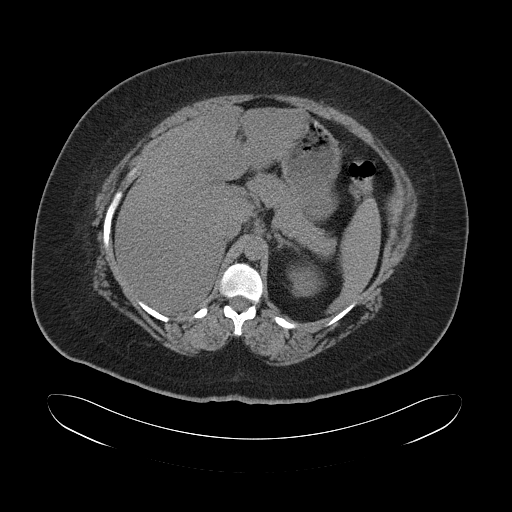
[im 69/100  lung]
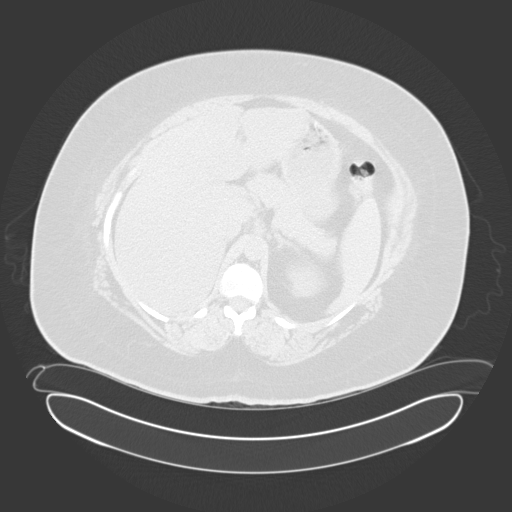
[im 69/100  bone]
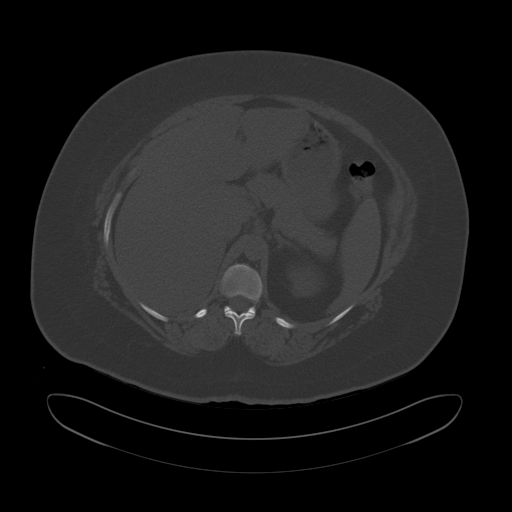
[im 77/100  soft-tissue]
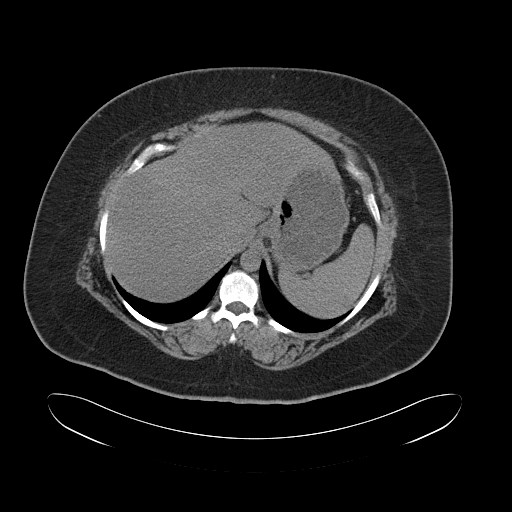
[im 77/100  lung]
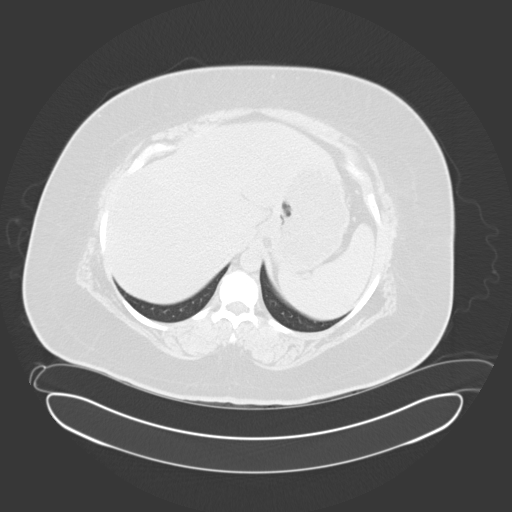
[im 84/100  soft-tissue]
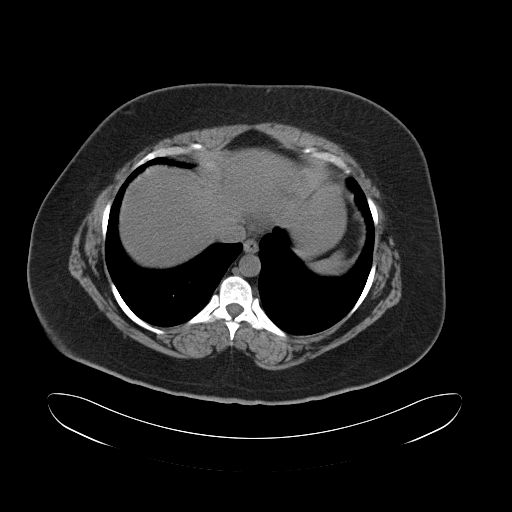
[im 84/100  lung]
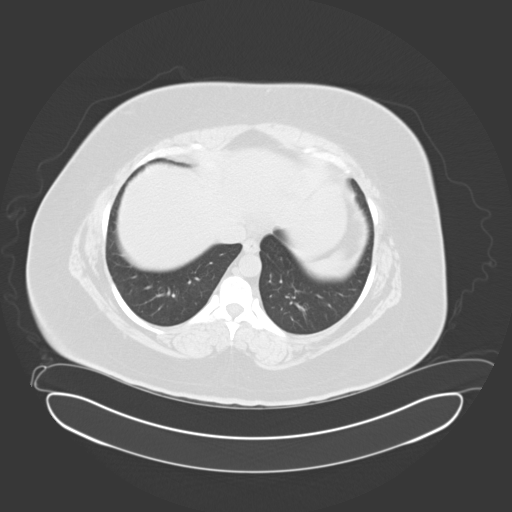
[im 92/100  soft-tissue]
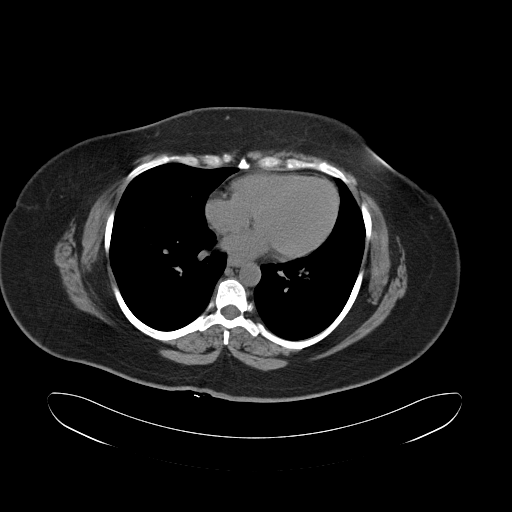
[im 92/100  lung]
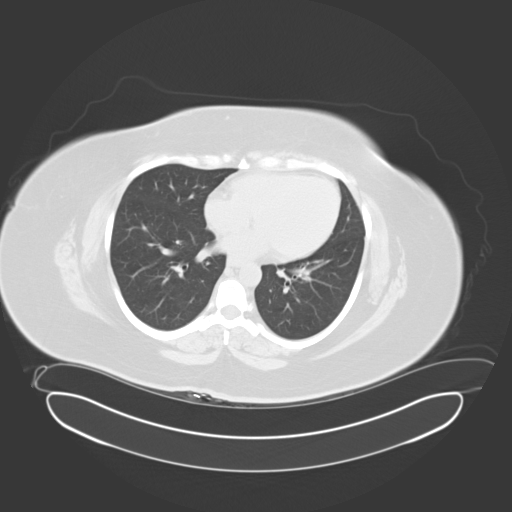

[12 of 32 positions shown; findings below may reference images not displayed]

FINDINGS: Lower chest: No pleural or pericardial effusion. Lung bases appear
clear.

Hepatobiliary: Hepatic steatosis noted. No focal liver abnormality
identified. Gallbladder appears normal. No biliary dilatation

Pancreas: Normal

Spleen: The spleen appears normal.

Adrenals/Urinary Tract: The adrenal glands are normal. No kidney
stones identified. No obstructive uropathy identified. Urinary
bladder appears normal. On the delayed images there is symmetric
excretion of contrast material from both kidneys. No suspicious
filling defects identified within the collecting systems, ureters or
urinary bladder.

Stomach/Bowel: The stomach is normal. The small bowel loops have a
normal course and caliber without obstruction. No pathologic
dilatation of the large bowel loops.

Vascular/Lymphatic: Normal appearance of the abdominal aorta. No
enlarged retroperitoneal or mesenteric adenopathy. No enlarged
pelvic or inguinal lymph nodes.

Reproductive: Previous hysterectomy.  No adnexal mass noted.

Other: There is no ascites or focal fluid collections within the
abdomen or pelvis.

Musculoskeletal: No aggressive lytic or sclerotic bone lesions.
IMPRESSION: 1. No acute findings and no explanation for patient's left flank
pain and hematuria.
2. Hepatic steatosis.

## 2016-06-22 ENCOUNTER — Ambulatory Visit: Payer: 59

## 2016-07-03 ENCOUNTER — Telehealth: Payer: Self-pay | Admitting: *Deleted

## 2016-07-03 NOTE — Telephone Encounter (Signed)
She called to discuss her symptoms. She has had 2 episodes of epigastric pain lasting about 10 minutes. She also noticed an increase in loose BM's the same day with increased belching. She thinks it maybe her gall bladder, she will monitor her foods to see if that is a trigger. She wanted to make sure she needed an appointment or if testing needed to be done prior to an office visit (willing to go to PCP if needed). CT scan was 06-03-15 during a kidney stone attack. She was last seen ventral hernia repair in 2006.

## 2016-07-03 NOTE — Telephone Encounter (Signed)
We can arrange for elective office visit when convenient. Abdominal ultrasound would be appropriate. Her CT scan from 2016, not the best test to look at the gallbladder, did not show any abnormality in that area.

## 2016-07-04 ENCOUNTER — Telehealth: Payer: Self-pay | Admitting: General Surgery

## 2016-07-04 NOTE — Telephone Encounter (Signed)
PT RETURNING YOUR CALL.STATED YOU WHERE GOING TO SPEAK TO DR BYRNETT. SHE CAN BE REACHED @ [phone]

## 2016-07-05 NOTE — Telephone Encounter (Signed)
Notified patient as instructed, patient pleased. Discussed follow-up appointments, patient agrees, will monitor for now

## 2016-07-16 ENCOUNTER — Ambulatory Visit
Admission: RE | Admit: 2016-07-16 | Discharge: 2016-07-16 | Disposition: A | Discharge status: Home / Self Care | Payer: 59 | Source: Ambulatory Visit | Attending: Family Medicine | Admitting: Family Medicine

## 2016-07-16 DIAGNOSIS — Z1239 Encounter for other screening for malignant neoplasm of breast: Secondary | ICD-10-CM

## 2016-07-16 DIAGNOSIS — Z1231 Encounter for screening mammogram for malignant neoplasm of breast: Secondary | ICD-10-CM | POA: Insufficient documentation

## 2016-08-13 ENCOUNTER — Ambulatory Visit: Payer: 59 | Admitting: Family Medicine

## 2016-08-14 ENCOUNTER — Ambulatory Visit: Payer: Self-pay | Admitting: Family Medicine

## 2016-08-20 ENCOUNTER — Encounter: Payer: Self-pay | Admitting: Physical Therapy

## 2016-08-20 ENCOUNTER — Ambulatory Visit: Payer: 59 | Attending: Family Medicine | Admitting: Physical Therapy

## 2016-08-20 DIAGNOSIS — R2689 Other abnormalities of gait and mobility: Secondary | ICD-10-CM | POA: Diagnosis not present

## 2016-08-20 DIAGNOSIS — R278 Other lack of coordination: Secondary | ICD-10-CM | POA: Insufficient documentation

## 2016-08-20 DIAGNOSIS — M533 Sacrococcygeal disorders, not elsewhere classified: Secondary | ICD-10-CM | POA: Diagnosis not present

## 2016-08-20 NOTE — Patient Instructions (Addendum)
    Increase water gradually from 16 fl oz1 bottle to 2 servings this week Decrease soda from 3 cans to 2 cans the following week while keeping with 2 servings of 16 fl oz of water.    ___________   Donald Prose routine:   turn TV off 30 min early, leave couch and head to bed / not fall asleep on the couch, and sharing time with husband.    Perform 10 reps x 2 of the Frog stretch  Frog stretch: laying on belly with pillow under hips, knees bent, inhale do nothing, exhale let ankles fall apart ~4 10 reps x 3     Laying side or back: (get used to not sleeping on belly)  Calming breath to help lengthen pelvic floor as ribcage expands on inhale, exhale, natural hug of belly and lift of pelvic floor  practice 1-2 pause inhale,  2-1 pause on exhale   10 reps  And then use CPAP machine    ___________  In morning: on bed    childs pose rocking  5 -8 reps   To lengthne back and pelvic floor

## 2016-08-21 NOTE — Therapy (Signed)
[place] MAIN [facility] SERVICES [address] [place], [place], [address] Phone: [phone]   Fax:  [phone]  Physical Therapy Evaluation  Patient Details  Name: Tricia Hill MRN: [id number] Date of Birth: 02/08/1971 Referring Provider: Park Liter, DO  Encounter Date: 08/20/2016      PT End of Session - 08/21/16 2211    Visit Number 1   Number of Visits 12   Date for PT Re-Evaluation 11/12/16   PT Start Time 0900   PT Stop Time 1010   PT Time Calculation (min) 70 min   Activity Tolerance Patient tolerated treatment well;No increased pain   Behavior During Therapy WFL for tasks assessed/performed      Past Medical History:  Diagnosis Date  . Diabetes mellitus without complication ([place])   . Heart murmur    child  . Shingles   . Sleep apnea     Past Surgical History:  Procedure Laterality Date  . ABDOMINAL HYSTERECTOMY     Partial  . CESAREAN SECTION     X 2  . SPINAL FUSION  2012   C3 and C4    There were no vitals filed for this visit.       Subjective Assessment - 08/21/16 2154    Subjective Pt reports: 1)  pelvic pain with gynecological exams and with sexual intercourse for the past 10 years following her partial hysterectomy.  2) SUI with coughing, sneezing, laughing. No pad wear. 3)  Hemorrhoids for the past couple of years. She had not noticed this issue before. No straining with bowel movements. Daily movements w/ consistency of Bristol Stool Scale 4.  4) LBP occurs with long periods of standing. Intermittent pain 8/10 at its worst, 0/10 on average. Pt feels it has to do with her weight and would lose weight. 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  Pt handling is required on the job.  Pt uses the hoyer lifts frequently.         Pertinent History 2 C-sections, partial hysterectomy.  Denied no constipation. Pt has lose weight through weight watchers before. Pt has been tending to family issues and has  not had time for self-care. Pt enjoys being on her couch and reading for relaxation.  CPAP use is dependent on whether or not she fall asleep on her couch.    Patient Stated Goals  pain free             [facility] PT Assessment - 08/21/16 2154      Assessment   Medical Diagnosis pelvic dysfunction   Referring Provider Park Liter, DO     Precautions   Precautions None     Restrictions   Weight Bearing Restrictions No     Balance Screen   Has the patient fallen in the past 6 months No     Prior Function   Level of Independence Independent     Observation/Other Assessments   Observations increased lumbar lordosis   Other Surveys  --  PDI 23%  , UDI  42%      Coordination   Gross Motor Movements are Fluid and Coordinated --  chest breathing, limited pelvic floor ROM   Fine Motor Movements are Fluid and Coordinated --  poor pelvic activation with cue for pelvic floor contraction     Posture/Postural Control   Posture Comments lumbopelvic perturbations with leg movements      ROM / Strength   AROM /  PROM / Strength --  hypermobility, hyperextended knees     Strength   Overall Strength Comments hip abduction B 4/5     Palpation   Spinal mobility increased thoracic mm tensions. hypomobility   SI assessment  L PSIS tenderness/ tenderness , tensions at L coccygeus > R   R PSIS with limited mobility in nutation                   [facility] Adult PT Treatment/Exercise - 08/21/16 2154      Therapeutic Activites    Therapeutic Activities --  see pt instructions     Neuro Re-ed    Neuro Re-ed Details  see pt instructions     Manual Therapy   Manual therapy comments long asix distraction L, inferior/superior mob of sacrum, PA mob on lateral border of sacrum with MWM, rotational mob                       PT Long Term Goals - 08/21/16 2228      PT LONG TERM GOAL #1   Title Pt will decrease score on UDI 42% to < 37% in order to improve pelvic floor  function.    Time 12   Period Weeks   Status New     PT LONG TERM GOAL #2   Title Pt will decrease score PDI 23% to  <18%  in order to improve pain and QOL   Time 12   Period Weeks   Status New     PT LONG TERM GOAL #3   Title Pt will demo decreased abdominal scar restrictions in order to improve deep core function    Time 12   Period Weeks   Status New     PT LONG TERM GOAL #4   Title Pt will demo no pelvic obliquities across 2 visits in order to progress to deep core/ hip thoracolumbar strengthening and decrease pain   Time 12   Period Weeks   Status New     PT LONG TERM GOAL #5   Title Pt will demo proper body mechanics with toileting, work tasks, pushing. lifting, pulling in order to minimize load on pelvic floor    Time 12   Period Weeks   Status New     Additional Long Term Goals   Additional Long Term Goals Yes     PT LONG TERM GOAL #6   Title Pt will report compliance with CPAP use and transitioned from couch to bed across 5 nights in 1 week to promote health and sleep    Time 12   Period Weeks   Status New     PT LONG TERM GOAL #7   Title Pt will report minimial LBP 1-2/10 across 3 work shifts in order to demo improve postural stability for safe patient handling   Time 12   Period Weeks   Status New               Plan - 08/21/16 2231    Clinical Impression Statement Pt is a 46 yo female who reports of pelvic pain, SUI, hemmorrhoids, and LBP. Pt's clincial presentation includes limited scar mobility,  poor coordination/ strength of deep core coordination, limited thoracci and pelvic floor mobility, pelvic obliquities, poor body mechanics, and limited education on bladder irritants on pelvic health. Internal assessment will be performed at next session.  Pt was provided education on bladder irritants and water ratio and technique for deep  core coordination today. Plan to also address body mechanics for simulated work tasks in future sessions to minimize  LBP. Pt was also motivational interviewing to strategize ways to be compliant with CPAP use 2/2 OSA and provided education on the importance of CPAP compliance. Pt agreed to using strategy.      Rehab Potential Good   Clinical Impairments Affecting Rehab Potential 2 C-sections, partial hysterectomy. 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    PT Frequency 1x / week   PT Duration 12 weeks   PT Treatment/Interventions Patient/family education;Therapeutic activities;Aquatic Therapy;Moist Heat;Stair training;Balance training;Therapeutic exercise;Neuromuscular re-education;Manual techniques;Scar mobilization;Manual lymph drainage   Consulted and Agree with Plan of Care Patient      Patient will benefit from skilled therapeutic intervention in order to improve the following deficits and impairments:  Pain, Postural dysfunction, Hypermobility, Decreased strength, Decreased safety awareness, Increased fascial restricitons, Decreased scar mobility, Decreased range of motion, Decreased mobility, Decreased endurance, Decreased balance, Hypomobility, Improper body mechanics, Impaired sensation  Visit Diagnosis: Other lack of coordination  Sacrococcygeal disorders, not elsewhere classified  Other abnormalities of gait and mobility     Problem List Patient Active Problem List   Diagnosis Date Noted  . Acute left flank pain 05/26/2015  . Microscopic hematuria 05/26/2015  . OSA (obstructive sleep apnea) 01/31/2015  . S/P hysterectomy 01/31/2015  . Uncontrolled diabetes mellitus ([place]) 01/31/2015  . Elevated BP without diagnosis of hypertension 01/31/2015  . Morbid obesity ([place]) 01/31/2015  . Hyperlipidemia associated with type 2 diabetes mellitus ([place]) 01/31/2015    Jerl Mina ,PT, DPT, E-RYT  08/21/2016, 10:31 PM  [place] MAIN [facility] SERVICES [address] [place], [place], [address] Phone:  [phone]   Fax:  [phone]  Name: GERALDYN SHAIN MRN: [id number] Date of Birth: 07/09/1970

## 2016-08-22 ENCOUNTER — Ambulatory Visit (INDEPENDENT_AMBULATORY_CARE_PROVIDER_SITE_OTHER): Payer: 59 | Admitting: Family Medicine

## 2016-08-22 ENCOUNTER — Encounter: Payer: Self-pay | Admitting: Family Medicine

## 2016-08-22 VITALS — BP 134/85 | HR 75 | Ht 62.0 in | Wt 255.0 lb

## 2016-08-22 DIAGNOSIS — E1165 Type 2 diabetes mellitus with hyperglycemia: Secondary | ICD-10-CM

## 2016-08-22 DIAGNOSIS — E1169 Type 2 diabetes mellitus with other specified complication: Secondary | ICD-10-CM | POA: Diagnosis not present

## 2016-08-22 DIAGNOSIS — E785 Hyperlipidemia, unspecified: Secondary | ICD-10-CM | POA: Diagnosis not present

## 2016-08-22 LAB — LIPID PANEL PICCOLO, WAIVED
CHOLESTEROL PICCOLO, WAIVED: 145 mg/dL (ref <200)
Chol/HDL Ratio Piccolo,Waive: 3.6 mg/dL
HDL CHOL PICCOLO, WAIVED: 40 mg/dL — AB (ref >59)
LDL Chol Calc Piccolo Waived: 37 mg/dL (ref <100)
Triglycerides Piccolo,Waived: 339 mg/dL — ABNORMAL HIGH (ref <150)
VLDL CHOL CALC PICCOLO,WAIVE: 68 mg/dL — AB (ref <30)

## 2016-08-22 LAB — BAYER DCA HB A1C WAIVED: HB A1C: 10.1 % — AB (ref <7.0)

## 2016-08-22 MED ORDER — LISINOPRIL 2.5 MG PO TABS: 2.5000 mg | ORAL_TABLET | Freq: Every day | ORAL | 1 refills | Status: DC

## 2016-08-22 MED ORDER — SEMAGLUTIDE(0.25 OR 0.5MG/DOS) 2 MG/1.5ML ~~LOC~~ SOPN: 0.2500 mg | PEN_INJECTOR | SUBCUTANEOUS | 6 refills | Status: DC

## 2016-08-22 MED ORDER — METFORMIN HCL 1000 MG PO TABS
1000.0000 mg | ORAL_TABLET | Freq: Two times a day (BID) | ORAL | 1 refills | Status: DC
Start: 2016-08-22 — End: 2017-02-28

## 2016-08-22 MED ORDER — ATORVASTATIN CALCIUM 40 MG PO TABS: 40.0000 mg | ORAL_TABLET | Freq: Every day | ORAL | 1 refills | Status: DC

## 2016-08-22 NOTE — Assessment & Plan Note (Signed)
Will start her on ozempic for her glucose control and also to try to get her weight down. Work on diet and exercise. Call with any concerns.

## 2016-08-22 NOTE — Assessment & Plan Note (Signed)
Under good control. Will continue atorvastatin and recheck in 6 months.

## 2016-08-22 NOTE — Patient Instructions (Signed)
0.25mg  weekly for 4 weeks, then increase to 0.5mg  for 4 weeks, then I'll call you and see if we need to go up

## 2016-08-22 NOTE — Assessment & Plan Note (Signed)
Under poor control with A1c of 10.1. Trulicity did not get her to goal, could not tolerate victoza due to nausea and vomiting. Will try her on ozempic for both glucose control and weight loss. Sample given today and Rx sent to her pharmacy. Call with any concerns.

## 2016-08-22 NOTE — Progress Notes (Signed)
BP 134/85   Pulse 75   Ht 5\' 2"  (1.575 m)   Wt 255 lb (115.7 kg)   LMP  (LMP Unknown)   SpO2 99%   BMI 46.64 kg/m    Subjective:    Patient ID: Tricia Hill, female    DOB: 1970-07-02, 46 y.o.   MRN: [id number]  HPI: Tricia Hill is a 46 y.o. female  Chief Complaint  Patient presents with  . Follow-up  . Diabetes   DIABETES- hasn't been taking her medicine every day, was getting really nauseous Hypoglycemic episodes:no Polydipsia/polyuria: no Visual disturbance: no Chest pain: no Paresthesias: no Glucose Monitoring: yes  Accucheck frequency: Daily Taking Insulin?: no Blood Pressure Monitoring: not checking Retinal Examination: Up to Date Foot Exam: Up to Date Diabetic Education: Completed Pneumovax: Up to Date Influenza: Up to Date Aspirin: no  HYPERLIPIDEMIA- hasn't been taking the atorvastatin daily  Satisfied with current treatment?  no Side effects:  no Medication compliance: fair compliance Past cholesterol meds: atorvastain (lipitor) Supplements: none Aspirin:  no Chest pain:  no Coronary artery disease:  no Family history CAD:  yes Family history early CAD:  yes  Relevant past medical, surgical, family and social history reviewed and updated as indicated. Interim medical history since our last visit reviewed. Allergies and medications reviewed and updated.  Review of Systems  Constitutional: Negative.   Respiratory: Negative.   Cardiovascular: Negative.   Psychiatric/Behavioral: Negative.     Per HPI unless specifically indicated above     Objective:    BP 134/85   Pulse 75   Ht 5\' 2"  (1.575 m)   Wt 255 lb (115.7 kg)   LMP  (LMP Unknown)   SpO2 99%   BMI 46.64 kg/m   Wt Readings from Last 3 Encounters:  08/22/16 255 lb (115.7 kg)  05/15/16 248 lb 9.6 oz (112.8 kg)  02/08/16 238 lb 3.2 oz (108 kg)    Physical Exam  Constitutional: She is oriented to person, place, and time. She appears well-developed and well-nourished.  No distress.  HENT:  Head: Normocephalic and atraumatic.  Right Ear: Hearing normal.  Left Ear: Hearing normal.  Nose: Nose normal.  Eyes: Conjunctivae and lids are normal. Right eye exhibits no discharge. Left eye exhibits no discharge. No scleral icterus.  Cardiovascular: Normal rate, regular rhythm, normal heart sounds and intact distal pulses.  Exam reveals no gallop and no friction rub.   No murmur heard. Pulmonary/Chest: Effort normal and breath sounds normal. No respiratory distress. She has no wheezes. She has no rales. She exhibits no tenderness.  Musculoskeletal: Normal range of motion.  Neurological: She is alert and oriented to person, place, and time.  Skin: Skin is warm, dry and intact. No rash noted. No erythema. No pallor.  Psychiatric: She has a normal mood and affect. Her speech is normal and behavior is normal. Judgment and thought content normal. Cognition and memory are normal.  Nursing note and vitals reviewed.   Results for orders placed or performed in visit on 05/23/16  HM DIABETES EYE EXAM  Result Value Ref Range   HM Diabetic Eye Exam No Retinopathy No Retinopathy      Assessment & Plan:   Problem List Items Addressed This Visit      Endocrine   Uncontrolled diabetes mellitus ([place]) - Primary    Under poor control with A1c of 10.1. Trulicity did not get her to goal, could not tolerate victoza due to nausea and vomiting. Will try her  on ozempic for both glucose control and weight loss. Sample given today and Rx sent to her pharmacy. Call with any concerns.       Relevant Medications   lisinopril (ZESTRIL) 2.5 MG tablet   atorvastatin (LIPITOR) 40 MG tablet   metFORMIN (GLUCOPHAGE) 1000 MG tablet   Semaglutide (OZEMPIC) 0.25 or 0.5 MG/DOSE SOPN   Other Relevant Orders   Bayer DCA Hb A1c Waived   Comprehensive metabolic panel   Hyperlipidemia associated with type 2 diabetes mellitus ([place])    Under good control. Will continue atorvastatin and recheck in  6 months.       Relevant Medications   lisinopril (ZESTRIL) 2.5 MG tablet   atorvastatin (LIPITOR) 40 MG tablet   metFORMIN (GLUCOPHAGE) 1000 MG tablet   Semaglutide (OZEMPIC) 0.25 or 0.5 MG/DOSE SOPN   Other Relevant Orders   Lipid Panel [place], Waived   Comprehensive metabolic panel     Other   Morbid obesity ([place])    Will start her on ozempic for her glucose control and also to try to get her weight down. Work on diet and exercise. Call with any concerns.       Relevant Medications   metFORMIN (GLUCOPHAGE) 1000 MG tablet   Semaglutide (OZEMPIC) 0.25 or 0.5 MG/DOSE SOPN       Follow up plan: Return in about 3 months (around 11/22/2016) for DM visit.

## 2016-08-23 LAB — COMPREHENSIVE METABOLIC PANEL
A/G RATIO: 1.6 (ref 1.2–2.2)
ALBUMIN: 3.8 g/dL (ref 3.5–5.5)
ALT: 17 IU/L (ref 0–32)
AST: 11 IU/L (ref 0–40)
Alkaline Phosphatase: 126 IU/L — ABNORMAL HIGH (ref 39–117)
BUN / CREAT RATIO: 14 (ref 9–23)
BUN: 8 mg/dL (ref 6–24)
Bilirubin Total: <0.2 mg/dL (ref 0.0–1.2)
CALCIUM: 9 mg/dL (ref 8.7–10.2)
CO2: 27 mmol/L (ref 18–29)
Chloride: 97 mmol/L (ref 96–106)
Creatinine, Ser: 0.58 mg/dL (ref 0.57–1.00)
GFR calc non Af Amer: 112 mL/min/{1.73_m2} (ref >59)
GFR, EST AFRICAN AMERICAN: 130 mL/min/{1.73_m2} (ref >59)
Globulin, Total: 2.4 g/dL (ref 1.5–4.5)
Glucose: 383 mg/dL — ABNORMAL HIGH (ref 65–99)
Potassium: 4.4 mmol/L (ref 3.5–5.2)
Sodium: 136 mmol/L (ref 134–144)
TOTAL PROTEIN: 6.2 g/dL (ref 6.0–8.5)

## 2016-08-27 ENCOUNTER — Ambulatory Visit: Payer: 59 | Admitting: Physical Therapy

## 2016-08-27 DIAGNOSIS — R278 Other lack of coordination: Secondary | ICD-10-CM | POA: Diagnosis not present

## 2016-08-27 DIAGNOSIS — R2689 Other abnormalities of gait and mobility: Secondary | ICD-10-CM | POA: Diagnosis not present

## 2016-08-27 DIAGNOSIS — M533 Sacrococcygeal disorders, not elsewhere classified: Secondary | ICD-10-CM | POA: Diagnosis not present

## 2016-08-27 NOTE — Therapy (Signed)
[place] MAIN [facility] SERVICES [address] [place], [place], [address] Phone: [phone]   Fax:  [phone]  Physical Therapy Treatment  Patient Details  Name: Tricia Hill MRN: [id number] Date of Birth: 29-Jun-1970 Referring Provider: Park Liter, DO  Encounter Date: 08/27/2016      PT End of Session - 08/27/16 1001    Visit Number 2   Number of Visits 12   Date for PT Re-Evaluation 11/12/16   PT Start Time 0908   PT Stop Time 1000   PT Time Calculation (min) 52 min   Activity Tolerance Patient tolerated treatment well;No increased pain   Behavior During Therapy WFL for tasks assessed/performed      Past Medical History:  Diagnosis Date  . Diabetes mellitus without complication ([place])   . Heart murmur    child  . Shingles   . Sleep apnea     Past Surgical History:  Procedure Laterality Date  . ABDOMINAL HYSTERECTOMY     Partial  . CESAREAN SECTION     X 2  . SPINAL FUSION  2012   C3 and C4    There were no vitals filed for this visit.      Subjective Assessment - 08/27/16 0919    Subjective Pt reported she has decreased soda intake. Pt went camping with her son this weekend and slept very well without TV watching.    Pertinent History 2 C-sections, partial hysterectomy.  Denied no constipation. Pt has lose weight through weight watchers before. Pt has been tending to family issues and has not had time for self-care. Pt enjoys being on her couch and reading for relaxation.  CPAP use is dependent on whether or not she fall asleep on her couch.    Patient Stated Goals  pain free             [facility] PT Assessment - 08/27/16 0945      Assessment   Medical Diagnosis pelvic dysfunction     Precautions   Precautions None     Restrictions   Weight Bearing Restrictions No     Prior Function   Level of Independence Independent     Observation/Other Assessments   Observations increased lumbar lordosis   Other  Surveys  --  PDI 23%  , UDI  42%      Coordination   Gross Motor Movements are Fluid and Coordinated --  chest breathing, limited pelvic floor ROM   Fine Motor Movements are Fluid and Coordinated --  poor pelvic activation with cue for pelvic floor contraction     Posture/Postural Control   Posture Comments lumbopelvic perturbations with leg movements      Strength   Overall Strength Comments hip abduction B 4/5     Palpation   Spinal mobility increased thoracic mm tensions. hypomobility  decreased post Tx   SI assessment  L PSIS non tender, coccygeus very minor tensions.   R PSIS with limited mobility in nutation                     [facility] Adult PT Treatment/Exercise - 08/27/16 0945      Therapeutic Activites    Therapeutic Activities --  see pt instructions     Neuro Re-ed    Neuro Re-ed Details  see pt instructions     Moist Heat Therapy   Number Minutes Moist Heat 10 Minutes   Moist Heat Location Other (comment)  back : skin  intact post Tx     Manual Therapy   Manual therapy comments STM, GHrade III mob PA along thoracic segments, MWM open book                 PT Education - 08/27/16 1001    Education provided Yes   Education Details HEP   Person(s) Educated Patient   Methods Explanation;Demonstration;Verbal cues;Tactile cues;Handout   Comprehension Returned demonstration;Verbalized understanding             PT Long Term Goals - 08/21/16 2228      PT LONG TERM GOAL #1   Title Pt will decrease score on UDI 42% to < 37% in order to improve pelvic floor function.    Time 12   Period Weeks   Status New     PT LONG TERM GOAL #2   Title Pt will decrease score PDI 23% to  <18%  in order to improve pain and QOL   Time 12   Period Weeks   Status New     PT LONG TERM GOAL #3   Title Pt will demo decreased abdominal scar restrictions in order to improve deep core function    Time 12   Period Weeks   Status New     PT LONG TERM  GOAL #4   Title Pt will demo no pelvic obliquities across 2 visits in order to progress to deep core/ hip thoracolumbar strengthening and decrease pain   Time 12   Period Weeks   Status New     PT LONG TERM GOAL #5   Title Pt will demo proper body mechanics with toileting, work tasks, pushing. lifting, pulling in order to minimize load on pelvic floor    Time 12   Period Weeks   Status New     Additional Long Term Goals   Additional Long Term Goals Yes     PT LONG TERM GOAL #6   Title Pt will report compliance with CPAP use and transitioned from couch to bed across 5 nights in 1 week to promote health and sleep    Time 12   Period Weeks   Status New     PT LONG TERM GOAL #7   Title Pt will report minimial LBP 1-2/10 across 3 work shifts in order to demo improve postural stability for safe patient handling   Time 12   Period Weeks   Status New               Plan - 08/27/16 1002    Clinical Impression Statement Pt is progressing well with PT and showed compliance to CPAP use and decreasing soda intake since last visit. Pt showed improve SIJ arthrokinematics and decreased pelvic floor tightness. Progressed to deep core level 1-2 . Tolerated manual Tx to minimize thoracic tightness and hypomobility. Educated on equal weightbearing on BLE at work to minimize relapse of imbalance of pelvic floor tightness. Pt demo'd correctly. Pt continues to benefit from PT    Rehab Potential Good   Clinical Impairments Affecting Rehab Potential 2 C-sections, partial hysterectomy. 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    PT Frequency 1x / week   PT Duration 12 weeks   PT Treatment/Interventions Patient/family education;Therapeutic activities;Aquatic Therapy;Moist Heat;Stair training;Balance training;Therapeutic exercise;Neuromuscular re-education;Manual techniques;Scar mobilization;Manual lymph drainage   Consulted and Agree with Plan  of Care Patient      Patient will benefit from skilled therapeutic intervention  in order to improve the following deficits and impairments:  Pain, Postural dysfunction, Hypermobility, Decreased strength, Decreased safety awareness, Increased fascial restricitons, Decreased scar mobility, Decreased range of motion, Decreased mobility, Decreased endurance, Decreased balance, Hypomobility, Improper body mechanics, Impaired sensation  Visit Diagnosis: Sacrococcygeal disorders, not elsewhere classified  Other lack of coordination  Other abnormalities of gait and mobility     Problem List Patient Active Problem List   Diagnosis Date Noted  . Acute left flank pain 05/26/2015  . Microscopic hematuria 05/26/2015  . OSA (obstructive sleep apnea) 01/31/2015  . S/P hysterectomy 01/31/2015  . Uncontrolled diabetes mellitus ([place]) 01/31/2015  . Elevated BP without diagnosis of hypertension 01/31/2015  . Morbid obesity ([place]) 01/31/2015  . Hyperlipidemia associated with type 2 diabetes mellitus ([place]) 01/31/2015    Jerl Mina ,PT, DPT, E-RYT  08/27/2016, 10:03 AM  [place] MAIN [facility] SERVICES [address] [place], [place], [address] Phone: [phone]   Fax:  [phone]  Name: Tricia Hill MRN: [id number] Date of Birth: Jun 01, 1971

## 2016-08-27 NOTE — Patient Instructions (Addendum)
Stretches:   Child pose rocking 5 times by dresser : standing version  See picture      Open book  See handout   ___________  Dennis Bast are now ready to begin training the deep core muscles system: diaphragm, transverse abdominis, pelvic floor . These muscles must work together as a team.           The key to these exercises to train the brain to coordinate the timing of these muscles and to have them turn on for long periods of time to hold you upright against gravity (especially important if you are on your feet all day).These muscles are postural muscles and play a role stabilizing your spine and bodyweight. By doing these repetitions slowly and correctly instead of doing crunches, you will achieve a flatter belly without a lower pooch. You are also placing your spine in a more neutral position and breathing properly which in turn, decreases your risk for problems related to your pelvic floor, abdominal, and low back such as pelvic organ prolapse, hernias, diastasis recti (separation of superficial muscles), disk herniations, spinal fractures. These exercises set a solid foundation for you to later progress to resistance/ strength training with therabands and weights and return to other typical fitness exercises with a stronger deeper core.   Do level 1 : 10 reps Do level 2: 10 reps (left and right = 1 rep) x 3 sets , 2 x day Do not progress to level 3 for 3-4 weeks. You know you are ready when you do not have any rocking of pelvis nor arching in your back   See handout        __________   At work:  Equal weight bearing on both feet/ legs. Not shifting to one side to minimize pelvic floor tightness    At home: When putting bra on, lower trunk band 1 inch above elbow and at the end fasteners  to increase breathing capacity

## 2016-09-03 ENCOUNTER — Ambulatory Visit: Payer: 59 | Admitting: Physical Therapy

## 2016-09-10 ENCOUNTER — Ambulatory Visit: Payer: 59 | Admitting: Physical Therapy

## 2016-09-21 ENCOUNTER — Telehealth: Payer: Self-pay | Admitting: Family Medicine

## 2016-09-21 NOTE — Telephone Encounter (Signed)
Patient dropped off a form for Dr Wynetta Emery to fill out for her, patient has applied to foster care.  She would like to pick up first of the week if possible.  Thanks  [phone]

## 2016-09-24 ENCOUNTER — Ambulatory Visit: Payer: 59 | Attending: Family Medicine | Admitting: Physical Therapy

## 2016-09-24 ENCOUNTER — Other Ambulatory Visit: Payer: Self-pay | Admitting: Family Medicine

## 2016-09-24 DIAGNOSIS — Z111 Encounter for screening for respiratory tuberculosis: Secondary | ICD-10-CM

## 2016-09-24 DIAGNOSIS — M533 Sacrococcygeal disorders, not elsewhere classified: Secondary | ICD-10-CM | POA: Diagnosis not present

## 2016-09-24 DIAGNOSIS — R278 Other lack of coordination: Secondary | ICD-10-CM | POA: Diagnosis not present

## 2016-09-24 DIAGNOSIS — R2689 Other abnormalities of gait and mobility: Secondary | ICD-10-CM | POA: Diagnosis not present

## 2016-09-24 NOTE — Patient Instructions (Signed)
You are now ready to begin training the deep core muscles system: diaphragm, transverse abdominis, pelvic floor . These muscles must work together as a team.           The key to these exercises to train the brain to coordinate the timing of these muscles and to have them turn on for long periods of time to hold you upright against gravity (especially important if you are on your feet all day).These muscles are postural muscles and play a role stabilizing your spine and bodyweight. By doing these repetitions slowly and correctly instead of doing crunches, you will achieve a flatter belly without a lower pooch. You are also placing your spine in a more neutral position and breathing properly which in turn, decreases your risk for problems related to your pelvic floor, abdominal, and low back such as pelvic organ prolapse, hernias, diastasis recti (separation of superficial muscles), disk herniations, spinal fractures. These exercises set a solid foundation for you to later progress to resistance/ strength training with therabands and weights and return to other typical fitness exercises with a stronger deeper core.   Do level 1 : 10 reps Do level 2: 10 reps (left and right = 1 rep) x 3 sets , 2 x day Do not progress to level 3 for 3-4 weeks. You know you are ready when you do not have any rocking of pelvis nor arching in your back   (handout)    __________  Pelvic tilts 5 rep Inhale, tilt forward  Exhale, tuck under slightly without squeezing gluts

## 2016-09-24 NOTE — Therapy (Signed)
[place] MAIN [facility] SERVICES [address] [place], [place], [address] Phone: [phone]   Fax:  [phone]  Physical Therapy Treatment  Patient Details  Name: Tricia Hill MRN: [id number] Date of Birth: [birth year] Referring Provider: Park Liter, DO  Encounter Date: 09/24/2016      PT End of Session - 09/24/16 1002    Visit Number 3   Number of Visits 12   Date for PT Re-Evaluation 11/12/16   PT Start Time 0907   PT Stop Time 1000   PT Time Calculation (min) 53 min   Activity Tolerance Patient tolerated treatment well;No increased pain   Behavior During Therapy WFL for tasks assessed/performed      Past Medical History:  Diagnosis Date  . Diabetes mellitus without complication ([place])   . Heart murmur    child  . Shingles   . Sleep apnea     Past Surgical History:  Procedure Laterality Date  . ABDOMINAL HYSTERECTOMY     Partial  . CESAREAN SECTION     X 2  . SPINAL FUSION  2012   C3 and C4    There were no vitals filed for this visit.      Subjective Assessment - 09/24/16 0911    Subjective Pt was sick and had to miss one session. Pt has been able to do some of her HEP    Pertinent History 2 C-sections, partial hysterectomy.  Denied no constipation. Pt has lose weight through weight watchers before. Pt has been tending to family issues and has not had time for self-care. Pt enjoys being on her couch and reading for relaxation.  CPAP use is dependent on whether or not she fall asleep on her couch.    Patient Stated Goals  pain free             [facility] PT Assessment - 09/24/16 0943      Posture/Postural Control   Posture Comments cued for postural stability in deep core level 2.      Palpation   SI assessment  pelvic symmetry   Palpation comment increased medial aspect of abdominal scar (decreased post Tx)                   Pelvic Floor Special Questions - 09/24/16 0941    Pelvic Floor  Internal Exam pt consented verbally without contraindications    Palpation tenderness and tensions at iliococcygeus R > L            [facility] Adult PT Treatment/Exercise - 09/24/16 0943      Neuro Re-ed    Neuro Re-ed Details  see pt instructions     Manual Therapy   Manual therapy comments abdominal scar release   Internal Pelvic Floor MET at iliococcygeus R, STM, pelvic tilts                PT Education - 09/24/16 0953    Education provided Yes   Education Details HEP   Person(s) Educated Patient   Methods Explanation   Comprehension Verbalized understanding;Returned demonstration;Verbal cues required;Tactile cues required;Need further instruction             PT Long Term Goals - 09/24/16 0912      PT LONG TERM GOAL #1   Title Pt will decrease score on UDI 42% to < 37% in order to improve pelvic floor function.    Time 12   Period Weeks   Status On-going  PT LONG TERM GOAL #2   Title Pt will decrease score PDI 23% to  <18%  in order to improve pain and QOL   Time 12   Period Weeks   Status On-going     PT LONG TERM GOAL #3   Title Pt will demo decreased abdominal scar restrictions in order to improve deep core function    Time 12   Period Weeks   Status On-going     PT LONG TERM GOAL #4   Title Pt will demo no pelvic obliquities across 2 visits in order to progress to deep core/ hip thoracolumbar strengthening and decrease pain   Time 12   Period Weeks   Status On-going     PT LONG TERM GOAL #5   Title Pt will demo proper body mechanics with toileting, work tasks, pushing. lifting, pulling in order to minimize load on pelvic floor    Time 12   Period Weeks   Status On-going     PT LONG TERM GOAL #6   Title Pt will report compliance with CPAP use and transitioned from couch to bed across 5 nights in 1 week to promote health and sleep    Time 12   Period Weeks   Status Achieved     PT LONG TERM GOAL #7   Title Pt will report minimial LBP  1-2/10 across 3 work shifts in order to demo improve postural stability for safe patient handling   Time 12   Period Weeks   Status Achieved               Plan - 09/24/16 1003    Clinical Impression Statement Pt reports she has not noticed her LBP the past weeks and continues to change her standing posture to promote equal weight bearing on BLE instead of leaning on one side. Pt showed good carry over with pelvic girdle symmetry and less midback tightness. Today, pt tolerated Tx without complaints. Scar restrictions and tight R iliococcygeus mm improved in mobility with manual Tx. Pt demo'd increased pelvic floor ROM and progressed to deep core level 1 and 2 exercise. Pt continues to benefit from skilled PT.     Rehab Potential Good   Clinical Impairments Affecting Rehab Potential 2 C-sections, partial hysterectomy. 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    PT Frequency 1x / week   PT Duration 12 weeks   PT Treatment/Interventions Patient/family education;Therapeutic activities;Aquatic Therapy;Moist Heat;Stair training;Balance training;Therapeutic exercise;Neuromuscular re-education;Manual techniques;Scar mobilization;Manual lymph drainage   Consulted and Agree with Plan of Care Patient      Patient will benefit from skilled therapeutic intervention in order to improve the following deficits and impairments:  Pain, Postural dysfunction, Hypermobility, Decreased strength, Decreased safety awareness, Increased fascial restricitons, Decreased scar mobility, Decreased range of motion, Decreased mobility, Decreased endurance, Decreased balance, Hypomobility, Improper body mechanics, Impaired sensation  Visit Diagnosis: Sacrococcygeal disorders, not elsewhere classified  Other lack of coordination  Other abnormalities of gait and mobility     Problem List Patient Active Problem List   Diagnosis Date Noted  . Acute left  flank pain 05/26/2015  . Microscopic hematuria 05/26/2015  . OSA (obstructive sleep apnea) 01/31/2015  . S/P hysterectomy 01/31/2015  . Uncontrolled diabetes mellitus ([place]) 01/31/2015  . Elevated BP without diagnosis of hypertension 01/31/2015  . Morbid obesity ([place]) 01/31/2015  . Hyperlipidemia associated with type 2 diabetes mellitus ([place]) 01/31/2015    Jerl Mina ,PT,  DPT, E-RYT  09/24/2016, 10:06 AM  [place] MAIN [facility] SERVICES [address] [place], [place], [address] Phone: [phone]   Fax:  [phone]  Name: Tricia Hill MRN: [id number] Date of Birth: [birth year]

## 2016-09-25 ENCOUNTER — Other Ambulatory Visit: Payer: 59

## 2016-09-25 DIAGNOSIS — Z111 Encounter for screening for respiratory tuberculosis: Secondary | ICD-10-CM | POA: Diagnosis not present

## 2016-09-27 LAB — QUANTIFERON IN TUBE
QFT TB AG MINUS NIL VALUE: 0.01 [IU]/mL
QUANTIFERON NIL VALUE: 0.02 [IU]/mL
QUANTIFERON TB AG VALUE: 0.03 [IU]/mL
QUANTIFERON TB GOLD: NEGATIVE

## 2016-09-27 LAB — QUANTIFERON TB GOLD ASSAY (BLOOD)

## 2016-10-08 ENCOUNTER — Ambulatory Visit: Payer: 59 | Admitting: Physical Therapy

## 2016-10-22 ENCOUNTER — Ambulatory Visit: Payer: 59 | Attending: Family Medicine | Admitting: Physical Therapy

## 2016-10-22 ENCOUNTER — Telehealth: Payer: Self-pay | Admitting: Family Medicine

## 2016-10-22 NOTE — Telephone Encounter (Signed)
Called an [facility] for her to call back. Will await call back.

## 2016-10-22 NOTE — Telephone Encounter (Signed)
-----   Message from Tricia Hill, [place] sent at 08/22/2016 11:08 AM EDT ----- Call about her sugars to see if we need to go up to 1mg  on the ozempic

## 2016-10-29 ENCOUNTER — Telehealth: Payer: Self-pay | Admitting: Family Medicine

## 2016-10-29 MED ORDER — SEMAGLUTIDE(0.25 OR 0.5MG/DOS) 2 MG/1.5ML ~~LOC~~ SOPN: 0.2500 mg | PEN_INJECTOR | SUBCUTANEOUS | 6 refills | Status: DC

## 2016-10-29 MED ORDER — GLUCOSE BLOOD VI STRP: ORAL_STRIP | 12 refills | Status: DC

## 2016-10-29 NOTE — Telephone Encounter (Signed)
Called and LMOM. This is 3rd unsuccessful attempt to get in touch- will send letter.

## 2016-10-29 NOTE — Telephone Encounter (Signed)
Called back by Jonelle Sidle. She ran out of her ozempic about 2 weeks ago and hasn't picked it up. Tolerated it well. Will give her another sample (will pick up 1st thing in the AM) and will do 0.25 weekly x 1 month, 0.5 weekly x 1 month then to 1mg  and we'll see her in that time. Rx sent to her pharmacy. Refill on test strips also sent.

## 2016-10-30 NOTE — Telephone Encounter (Signed)
Patient stopped by to pick up her sample.

## 2016-11-22 ENCOUNTER — Ambulatory Visit: Payer: 59 | Admitting: Family Medicine

## 2016-11-28 ENCOUNTER — Encounter: Payer: Self-pay | Admitting: Family Medicine

## 2016-11-28 ENCOUNTER — Ambulatory Visit (INDEPENDENT_AMBULATORY_CARE_PROVIDER_SITE_OTHER): Payer: 59 | Admitting: Family Medicine

## 2016-11-28 ENCOUNTER — Telehealth: Payer: Self-pay | Admitting: Family Medicine

## 2016-11-28 VITALS — BP 142/84 | HR 74 | Temp 98.2°F | Wt 246.2 lb

## 2016-11-28 DIAGNOSIS — E1165 Type 2 diabetes mellitus with hyperglycemia: Secondary | ICD-10-CM

## 2016-11-28 DIAGNOSIS — R1013 Epigastric pain: Secondary | ICD-10-CM

## 2016-11-28 LAB — BAYER DCA HB A1C WAIVED: HB A1C (BAYER DCA - WAIVED): 8.7 % — ABNORMAL HIGH (ref <7.0)

## 2016-11-28 MED ORDER — SEMAGLUTIDE (1 MG/DOSE) 2 MG/1.5ML ~~LOC~~ SOPN: 1.0000 mg | PEN_INJECTOR | SUBCUTANEOUS | 6 refills | Status: DC

## 2016-11-28 MED ORDER — OMEPRAZOLE 20 MG PO CPDR: 20.0000 mg | DELAYED_RELEASE_CAPSULE | Freq: Every day | ORAL | 1 refills | Status: DC

## 2016-11-28 NOTE — Telephone Encounter (Signed)
Please let her know that I sent the 1 mg pens sent to her pharmacy. OK to use the old Rx for the month until she runs out, then switch to the 1mg - will take exactly the same. Thanks!

## 2016-11-28 NOTE — Assessment & Plan Note (Addendum)
A1c down to 8.7! Lost 9 pounds! Stopped drinking soda. Plan to increase ozempic to 1mg , but just picked up new pens. Will do this when she finishes what she has. Work on diet and exercise. Call with any concerns.

## 2016-11-28 NOTE — Telephone Encounter (Signed)
Patient notified

## 2016-11-28 NOTE — Telephone Encounter (Signed)
Patient said Dr Wynetta Emery wanted her to call back to let her know she had 30 day supply of Ozepic   Thanks

## 2016-11-28 NOTE — Progress Notes (Signed)
BP (!) 142/84   Pulse 74   Temp 98.2 F (36.8 C)   Wt 246 lb 3 oz (111.7 kg)   LMP  (LMP Unknown)   SpO2 97%   BMI 45.03 kg/m    Subjective:    Patient ID: Tricia Hill, female    DOB: 04-May-1971, 46 y.o.   MRN: [id number]  HPI: Tricia Hill is a 46 y.o. female  Chief Complaint  Patient presents with  . Diabetes   DIABETES Hypoglycemic episodes:no Polydipsia/polyuria: no Visual disturbance: no Chest pain: no Paresthesias: occasionally in R later foot Glucose Monitoring: yes  Accucheck frequency: Daily  Fasting glucose: Taking Insulin?: no Blood Pressure Monitoring: a few times a day Retinal Examination: Up to Date Foot Exam: Up to Date Diabetic Education: Completed Pneumovax: Up to Date Influenza: Up to Date Aspirin: no   ABDOMINAL PAIN  Duration: 3 months Onset: sudden Severity: severe Quality: "doubles her over" Location:  RUQ and epigastric  Episode duration:  Radiation: no Frequency: 3x Alleviating factors: nothing  Aggravating factors: nothing Status: fluctuating Treatments attempted: none Fever: no Nausea: no Vomiting: no Weight loss: yes- with effort Decreased appetite: no Diarrhea: no Constipation: no Blood in stool: no Heartburn: yes Jaundice: no Rash: no Dysuria/urinary frequency: no Hematuria: no  Relevant past medical, surgical, family and social history reviewed and updated as indicated. Interim medical history since our last visit reviewed. Allergies and medications reviewed and updated.  Review of Systems  Constitutional: Negative.   Respiratory: Negative.   Cardiovascular: Negative.   Gastrointestinal: Positive for abdominal pain. Negative for abdominal distention, anal bleeding, blood in stool, constipation, diarrhea, nausea, rectal pain and vomiting.  Neurological: Negative.   Psychiatric/Behavioral: Negative.     Per HPI unless specifically indicated above     Objective:    BP (!) 142/84   Pulse 74    Temp 98.2 F (36.8 C)   Wt 246 lb 3 oz (111.7 kg)   LMP  (LMP Unknown)   SpO2 97%   BMI 45.03 kg/m   Wt Readings from Last 3 Encounters:  11/28/16 246 lb 3 oz (111.7 kg)  08/22/16 255 lb (115.7 kg)  05/15/16 248 lb 9.6 oz (112.8 kg)    Physical Exam  Constitutional: She is oriented to person, place, and time. She appears well-developed and well-nourished. No distress.  HENT:  Head: Normocephalic and atraumatic.  Right Ear: Hearing normal.  Left Ear: Hearing normal.  Nose: Nose normal.  Eyes: Conjunctivae and lids are normal. Right eye exhibits no discharge. Left eye exhibits no discharge. No scleral icterus.  Cardiovascular: Normal rate, regular rhythm, normal heart sounds and intact distal pulses.  Exam reveals no gallop and no friction rub.   No murmur heard. Pulmonary/Chest: Effort normal and breath sounds normal. No respiratory distress. She has no wheezes. She has no rales. She exhibits no tenderness.  Musculoskeletal: Normal range of motion.  Neurological: She is alert and oriented to person, place, and time.  Skin: Skin is warm, dry and intact. No rash noted. She is not diaphoretic. No erythema. No pallor.  Psychiatric: She has a normal mood and affect. Her speech is normal and behavior is normal. Judgment and thought content normal. Cognition and memory are normal.  Nursing note and vitals reviewed.   Results for orders placed or performed in visit on 09/25/16  Quantiferon tb gold assay (blood)  Result Value Ref Range   QUANTIFERON INCUBATION Comment   QuantiFERON In Tube  Result Value Ref Range  QUANTIFERON TB GOLD Negative Negative   QUANTIFERON CRITERIA Comment    QUANTIFERON TB AG VALUE 0.03 IU/mL   Quantiferon Nil Value 0.02 IU/mL   QUANTIFERON MITOGEN VALUE >10.00 IU/mL   QFT TB AG MINUS NIL VALUE 0.01 IU/mL   Interpretation: Comment       Assessment & Plan:   Problem List Items Addressed This Visit      Endocrine   Uncontrolled diabetes mellitus ([place])  - Primary    A1c down to 8.7! Lost 9 pounds! Stopped drinking soda. Plan to increase ozempic to 1mg , but just picked up new pens. Will do this when she finishes what she has. Work on diet and exercise. Call with any concerns.       Relevant Orders   Bayer DCA Hb A1c Waived    Other Visit Diagnoses    Epigastric pain       ?GERD acting up- will start 1 month of omeprazole to see if that helps. Will also obtain RUQ [place] to r/o gall stones. Call if not getting better. Await results.    Relevant Orders   US Abdomen Limited RUQ       Follow up plan: Return in about 3 months (around 02/28/2017) for Diabetes/cholesterol/BP follow up.

## 2016-12-06 ENCOUNTER — Ambulatory Visit
Admission: RE | Admit: 2016-12-06 | Discharge: 2016-12-06 | Disposition: A | Discharge status: Home / Self Care | Payer: 59 | Source: Ambulatory Visit | Attending: Family Medicine | Admitting: Family Medicine

## 2016-12-06 DIAGNOSIS — K8020 Calculus of gallbladder without cholecystitis without obstruction: Secondary | ICD-10-CM | POA: Insufficient documentation

## 2016-12-06 DIAGNOSIS — R1013 Epigastric pain: Secondary | ICD-10-CM | POA: Diagnosis present

## 2016-12-06 DIAGNOSIS — K769 Liver disease, unspecified: Secondary | ICD-10-CM | POA: Insufficient documentation

## 2016-12-07 ENCOUNTER — Telehealth: Payer: Self-pay | Admitting: Family Medicine

## 2016-12-07 DIAGNOSIS — K8020 Calculus of gallbladder without cholecystitis without obstruction: Secondary | ICD-10-CM

## 2016-12-07 DIAGNOSIS — K760 Fatty (change of) liver, not elsewhere classified: Secondary | ICD-10-CM

## 2016-12-07 NOTE — Telephone Encounter (Signed)
Please let her know that her US showed that she does have gall stones! So that's probably why her belly is hurting. I've put in a referral for her to see the surgeon to talk about options. Thanks!

## 2016-12-07 NOTE — Telephone Encounter (Signed)
Message relayed to patient. Verbalized understanding and denied questions.  Pt requesting to see Dr. Marlyn Corporal @ [facility], note added to referral about pt's preference.

## 2016-12-10 ENCOUNTER — Telehealth: Payer: Self-pay | Admitting: General Surgery

## 2016-12-10 NOTE — Telephone Encounter (Signed)
LEFT MESSAGE TO CALL [facility] & SCHEDULE AN APPOINTMENT WITH DR Bary Castilla FOR GALLSTONES(U/S DONE 12-06-16) REF'D BY DR MEGAN JOHNSON.UMR/CONE.

## 2016-12-11 ENCOUNTER — Telehealth: Payer: Self-pay | Admitting: General Surgery

## 2016-12-11 ENCOUNTER — Telehealth: Payer: Self-pay | Admitting: *Deleted

## 2016-12-11 NOTE — Telephone Encounter (Signed)
Appointment made

## 2016-12-11 NOTE — Telephone Encounter (Signed)
Patient called and wanted to talk to you, she stated that you was trying to get in touch with her.

## 2016-12-11 NOTE — Telephone Encounter (Signed)
LEFT ANOTHER MESSAGE ASKING PATIENT TO CALL & SCHEDULE AN APPOINTMENT WITH DR Bary Castilla AT [facility]'S REQUEST.

## 2016-12-19 ENCOUNTER — Ambulatory Visit (INDEPENDENT_AMBULATORY_CARE_PROVIDER_SITE_OTHER): Payer: 59 | Admitting: General Surgery

## 2016-12-19 ENCOUNTER — Encounter: Payer: Self-pay | Admitting: General Surgery

## 2016-12-19 VITALS — BP 130/72 | HR 88 | Resp 12 | Ht 62.0 in | Wt 251.0 lb

## 2016-12-19 DIAGNOSIS — K8020 Calculus of gallbladder without cholecystitis without obstruction: Secondary | ICD-10-CM | POA: Diagnosis not present

## 2016-12-19 MED ORDER — ONDANSETRON 4 MG PO TBDP: 4.0000 mg | ORAL_TABLET | Freq: Four times a day (QID) | ORAL | 0 refills | Status: DC | PRN

## 2016-12-19 MED ORDER — HYDROCODONE-ACETAMINOPHEN 5-325 MG PO TABS: 1.0000 | ORAL_TABLET | Freq: Four times a day (QID) | ORAL | 0 refills | Status: DC | PRN

## 2016-12-19 MED ORDER — OMEPRAZOLE 20 MG PO CPDR: 20.0000 mg | DELAYED_RELEASE_CAPSULE | Freq: Every day | ORAL | 12 refills | Status: DC

## 2016-12-19 NOTE — Progress Notes (Signed)
Patient ID: Tricia Hill, female   DOB: September 06, 1970, 46 y.o.   MRN: [id number]  Chief Complaint  Patient presents with  . Abdominal Pain    HPI Tricia Hill is a 46 y.o. female here today for a evaluation of abdominal pain. Patient states thie pain has been going on for three months. The pain is in her epigastric area and this week the pain has been on her right upper quadrant. Ultrasound sound done on 12/06/2016. No vomiting but some nauseas. No food triggers the pain. Moves her bowels daily.  HPI  Past Medical History:  Diagnosis Date  . Diabetes mellitus without complication ([place]) 7494  . Heart murmur    child  . Shingles   . Sleep apnea     Past Surgical History:  Procedure Laterality Date  . ABDOMINAL HYSTERECTOMY     Partial  . CESAREAN SECTION     X 2  . SPINAL FUSION  2012   C3 and C4  . VENTRAL HERNIA REPAIR  06/13/2004   Excision abdominal wall endometrioma    Family History  Problem Relation Age of Onset  . Hypertension Mother   . Diabetes Mother   . Hypertension Father   . Graves' disease Sister   . Clotting disorder Brother   . Heart disease Maternal Grandmother   . Heart disease Maternal Grandfather   . Kidney disease Neg Hx   . Breast cancer Neg Hx     Social History Social History  Substance Use Topics  . Smoking status: Never Smoker  . Smokeless tobacco: Never Used  . Alcohol use Yes     Comment: on occasion    Allergies  Allergen Reactions  . Victoza [Liraglutide] Nausea And Vomiting    Current Outpatient Prescriptions  Medication Sig Dispense Refill  . atorvastatin (LIPITOR) 40 MG tablet Take 1 tablet (40 mg total) by mouth daily. 90 tablet 1  . glucose blood (FREESTYLE LITE) test strip USE AS DIRECTED 100 each 12  . glucose monitoring kit (FREESTYLE) monitoring kit 1 each by Does not apply route as needed for other. 1 each 0  . Lancets (FREESTYLE) lancets Use as instructed 100 each 12  . lisinopril (ZESTRIL) 2.5 MG tablet Take  1 tablet (2.5 mg total) by mouth daily. 90 tablet 1  . metFORMIN (GLUCOPHAGE) 1000 MG tablet Take 1 tablet (1,000 mg total) by mouth 2 (two) times daily. 180 tablet 1  . omeprazole (PRILOSEC) 20 MG capsule Take 1 capsule (20 mg total) by mouth daily. 30 capsule 1  . PROAIR HFA 108 (90 BASE) MCG/ACT inhaler Inhale 2 puffs into the lungs every 4 (four) hours as needed.  0  . Semaglutide (OZEMPIC) 1 MG/DOSE SOPN Inject 1 mg into the skin once a week. 1.5 mL 6  . HYDROcodone-acetaminophen (NORCO/VICODIN) 5-325 MG tablet Take 1 tablet by mouth every 6 (six) hours as needed for moderate pain. 30 tablet 0  . omeprazole (PRILOSEC) 20 MG capsule Take 1 capsule (20 mg total) by mouth daily. 90 capsule 12  . ondansetron (ZOFRAN ODT) 4 MG disintegrating tablet Take 1 tablet (4 mg total) by mouth every 6 (six) hours as needed for nausea or vomiting. 20 tablet 0   No current facility-administered medications for this visit.     Review of Systems Review of Systems  Constitutional: Negative.   Respiratory: Negative.   Cardiovascular: Negative.   Gastrointestinal: Positive for abdominal pain and nausea.    Blood pressure 130/72, pulse 88, resp. rate  12, height 5' 2"  (1.575 m), weight 251 lb (113.9 kg).  Physical Exam Physical Exam  Constitutional: She is oriented to person, place, and time. She appears well-developed and well-nourished.  Eyes: Conjunctivae are normal. No scleral icterus.  Neck: Neck supple.  Cardiovascular: Normal rate, regular rhythm and normal heart sounds.   Pulmonary/Chest: Effort normal and breath sounds normal.  Abdominal: Soft. Normal appearance and bowel sounds are normal. There is no hepatomegaly. There is no tenderness.  Lymphadenopathy:    She has no cervical adenopathy.  Neurological: She is alert and oriented to person, place, and time.  Skin: Skin is warm and dry.    Data Reviewed Review of the 06/03/2015 CT of the abdomen and pelvis obtained for an episodes of  acute right lateral abdominal wall pain did not show evidence of cholelithiasis at that time. Hepatic steatosis noted. Ultrasound of the abdomen obtained 12/06/2016 showed a 2.4 cm stone with mild gallbladder wall thickening at 3.8 mm. Common bile duct 4.1 mm.  Compresses a metabolic panel dated 36/11/7701 showed a abnormal blood glucose at 383. Normal liver function studies with the exception of a mild elevation of the alkaline phosphatase at 126. Total bilirubin less than 0.2. Normal transaminases.  Assessment    Symptomatic cholelithiasis.    Plan    The patient's work [employer] are scheduled for surgery in the near future and she is reluctant to move forward surgery immediately.  We'll schedule when convenient for her work schedule.    Laparoscopic Cholecystectomy with Intraoperative Cholangiogram. The procedure, including it's potential risks and complications (including but not limited to infection, bleeding, injury to intra-abdominal organs or bile ducts, bile leak, poor cosmetic result, sepsis and death) were discussed with the patient in detail. Non-operative options, including their inherent risks (acute calculous cholecystitis with possible choledocholithiasis or gallstone pancreatitis, with the risk of ascending cholangitis, sepsis, and death) were discussed as well. The patient expressed and understanding of what we discussed and wishes to proceed with laparoscopic cholecystectomy. The patient further understands that if it is technically not possible, or it is unsafe to proceed laparoscopically, that I will convert to an open cholecystectomy.  Zofran 53m 1 q6h #20 as the patient has had intermittent nausea. prilosec 255m1daily #90 12 refills to minimize reflux symptoms norco 5/32531m q4h as needed #30. Maximum 10 per day. This will cover her during [facility], [facility], Assessment and Plan have been scribed under the direction and in the  presence of JefHervey ArdD.  JesGaspar ColaMA  I have completed the exam and reviewed the above documentation for accuracy and completeness.  I agree with the above.  [employer] been used and any errors in dictation or transcription are unintentional.  JefHervey Ard.D., F.A.C.S.  ByrRobert Bellow11/2018, 9:30 PM  [facility] [place]

## 2016-12-19 NOTE — Patient Instructions (Signed)

## 2017-01-11 ENCOUNTER — Encounter
Admission: RE | Admit: 2017-01-11 | Discharge: 2017-01-11 | Disposition: A | Discharge status: Home / Self Care | Payer: 59 | Source: Ambulatory Visit | Attending: General Surgery | Admitting: General Surgery

## 2017-01-11 DIAGNOSIS — Z01818 Encounter for other preprocedural examination: Secondary | ICD-10-CM | POA: Insufficient documentation

## 2017-01-11 DIAGNOSIS — K8020 Calculus of gallbladder without cholecystitis without obstruction: Secondary | ICD-10-CM | POA: Insufficient documentation

## 2017-01-11 DIAGNOSIS — R9431 Abnormal electrocardiogram [ECG] [EKG]: Secondary | ICD-10-CM | POA: Insufficient documentation

## 2017-01-11 DIAGNOSIS — I10 Essential (primary) hypertension: Secondary | ICD-10-CM | POA: Insufficient documentation

## 2017-01-11 DIAGNOSIS — Z01812 Encounter for preprocedural laboratory examination: Secondary | ICD-10-CM | POA: Insufficient documentation

## 2017-01-11 DIAGNOSIS — E119 Type 2 diabetes mellitus without complications: Secondary | ICD-10-CM | POA: Insufficient documentation

## 2017-01-11 HISTORY — DX: Unspecified asthma, uncomplicated: J45.909

## 2017-01-11 HISTORY — DX: Essential (primary) hypertension: I10

## 2017-01-11 HISTORY — DX: Gastro-esophageal reflux disease without esophagitis: K21.9

## 2017-01-11 NOTE — Patient Instructions (Signed)
  Your procedure is scheduled on: 01-18-17 FRIDAY Report to Same Day Surgery 2nd floor medical mall [facility] Entrance-take elevator on left to 2nd floor.  Check in with surgery information desk.) To find out your arrival time please call [phone] between 1PM - 3PM on 01-17-17 THURSDAY  Remember: Instructions that are not followed completely may result in serious medical risk, up to and including death, or upon the discretion of your surgeon and anesthesiologist your surgery may need to be rescheduled.    _x___ 1. Do not eat food or drink liquids after midnight. No gum chewing or hard candies.     __x__ 2. No Alcohol for 24 hours before or after surgery.   __x__3. No Smoking for 24 prior to surgery.   ____  4. Bring all medications with you on the day of surgery if instructed.    __x__ 5. Notify your doctor if there is any change in your medical condition     (cold, fever, infections).     Do not wear jewelry, make-up, hairpins, clips or nail polish.  Do not wear lotions, powders, or perfumes. You may wear deodorant.  Do not shave 48 hours prior to surgery. Men may shave face and neck.  Do not bring valuables to the hospital.    [facility] is not responsible for any belongings or valuables.               Contacts, dentures or bridgework may not be worn into surgery.  Leave your suitcase in the car. After surgery it may be brought to your room.  For patients admitted to the hospital, discharge time is determined by your treatment team.   Patients discharged the day of surgery will not be allowed to drive home.  You will need someone to drive you home and stay with you the night of your procedure.    Please read over the following fact sheets that you were given:    _x___ [place] WITH A SMALL SIP OF WATER. These include:  1. LIPITOR  2. PRILOSEC  3. TAKE AN EXTRA PRILOSEC ON Thursday NIGHT BEFORE BED  4.  5.  6.  ____Fleets  enema or Magnesium Citrate as directed.   _x___ Use CHG Soap or sage wipes as directed on instruction sheet   _X___ Use inhalers on the day of surgery and bring to hospital day of surgery-USE ALBUTEROL INHALER AT [place]  _X___ Stop Metformin and Janumet 2 days prior to surgery-LAST DOSE OF METFORMIN ON Tuesday, AUGUST 7TH    ____ Take 1/2 of usual insulin dose the night before surgery and none on the morning surgery.   ____ Follow recommendations from Cardiologist, Pulmonologist or PCP regardingstopping Aspirin, Coumadin, Pllavix ,Eliquis, Effient, or Pradaxa, and Pletal.  ____Stop Anti-inflammatories such as Advil, Aleve, Ibuprofen, Motrin, Naproxen, Naprosyn, Goodies powders or aspirin products. OK to take Tylenol    ____ Stop supplements until after surgery.   _X___ Bring C-Pap to the hospital.

## 2017-01-14 ENCOUNTER — Telehealth: Payer: Self-pay | Admitting: *Deleted

## 2017-01-14 ENCOUNTER — Other Ambulatory Visit: Payer: Self-pay | Admitting: General Surgery

## 2017-01-14 ENCOUNTER — Encounter
Admission: RE | Admit: 2017-01-14 | Discharge: 2017-01-14 | Disposition: A | Discharge status: Home / Self Care | Payer: 59 | Source: Ambulatory Visit | Attending: General Surgery | Admitting: General Surgery

## 2017-01-14 DIAGNOSIS — K8020 Calculus of gallbladder without cholecystitis without obstruction: Secondary | ICD-10-CM | POA: Diagnosis not present

## 2017-01-14 DIAGNOSIS — E119 Type 2 diabetes mellitus without complications: Secondary | ICD-10-CM | POA: Diagnosis not present

## 2017-01-14 DIAGNOSIS — I10 Essential (primary) hypertension: Secondary | ICD-10-CM | POA: Diagnosis not present

## 2017-01-14 DIAGNOSIS — R9431 Abnormal electrocardiogram [ECG] [EKG]: Secondary | ICD-10-CM | POA: Diagnosis not present

## 2017-01-14 DIAGNOSIS — Z01818 Encounter for other preprocedural examination: Secondary | ICD-10-CM | POA: Diagnosis not present

## 2017-01-14 DIAGNOSIS — Z01812 Encounter for preprocedural laboratory examination: Secondary | ICD-10-CM | POA: Diagnosis not present

## 2017-01-14 LAB — BASIC METABOLIC PANEL
ANION GAP: 9 (ref 5–15)
BUN: 11 mg/dL (ref 6–20)
CALCIUM: 8.7 mg/dL — AB (ref 8.9–10.3)
CO2: 28 mmol/L (ref 22–32)
Chloride: 99 mmol/L — ABNORMAL LOW (ref 101–111)
Creatinine, Ser: 0.47 mg/dL (ref 0.44–1.00)
GLUCOSE: 237 mg/dL — AB (ref 65–99)
Potassium: 3.4 mmol/L — ABNORMAL LOW (ref 3.5–5.1)
Sodium: 136 mmol/L (ref 135–145)

## 2017-01-14 MED ORDER — POTASSIUM CHLORIDE ER 20 MEQ PO TBCR: 20.0000 meq | EXTENDED_RELEASE_TABLET | Freq: Every day | ORAL | 0 refills | Status: DC

## 2017-01-14 NOTE — Telephone Encounter (Signed)
Message left with patient as instructed. I have asked her to call the office tomorrow and confirm she received message.

## 2017-01-14 NOTE — Telephone Encounter (Signed)
-----   Message from Robert Bellow, MD sent at 01/14/2017  4:51 PM EDT ----- Please notify the patient a prescription for KCl tablets has been sent to her pharmacy. She should start these as soon as possible and take 1 daily including the morning of surgery. ----- Message ----- From: Buel Ream, Lab In [place] Sent: 01/14/2017  10:34 AM To: Robert Bellow, MD

## 2017-01-15 NOTE — Telephone Encounter (Signed)
Patient returned call and is aware of medication and instructions.

## 2017-01-17 MED ORDER — CEFAZOLIN SODIUM-DEXTROSE 2-4 GM/100ML-% IV SOLN
2.0000 g | INTRAVENOUS | Status: AC
  Administered 2017-01-18: 2 g via INTRAVENOUS

## 2017-01-18 ENCOUNTER — Ambulatory Visit
Admission: RE | Admit: 2017-01-18 | Discharge: 2017-01-18 | Disposition: A | Discharge status: Home / Self Care | Payer: 59 | Source: Ambulatory Visit | Attending: General Surgery | Admitting: General Surgery

## 2017-01-18 ENCOUNTER — Ambulatory Visit: Payer: 59 | Admitting: Anesthesiology

## 2017-01-18 ENCOUNTER — Ambulatory Visit: Payer: 59

## 2017-01-18 ENCOUNTER — Encounter
Admission: RE | Disposition: A | Discharge status: Home / Self Care | Payer: Self-pay | Source: Ambulatory Visit | Attending: General Surgery

## 2017-01-18 DIAGNOSIS — K8010 Calculus of gallbladder with chronic cholecystitis without obstruction: Secondary | ICD-10-CM | POA: Diagnosis not present

## 2017-01-18 DIAGNOSIS — G4730 Sleep apnea, unspecified: Secondary | ICD-10-CM | POA: Diagnosis not present

## 2017-01-18 DIAGNOSIS — K219 Gastro-esophageal reflux disease without esophagitis: Secondary | ICD-10-CM | POA: Diagnosis not present

## 2017-01-18 DIAGNOSIS — I10 Essential (primary) hypertension: Secondary | ICD-10-CM | POA: Insufficient documentation

## 2017-01-18 DIAGNOSIS — K8020 Calculus of gallbladder without cholecystitis without obstruction: Secondary | ICD-10-CM | POA: Diagnosis not present

## 2017-01-18 DIAGNOSIS — Z7984 Long term (current) use of oral hypoglycemic drugs: Secondary | ICD-10-CM | POA: Diagnosis not present

## 2017-01-18 DIAGNOSIS — Z79899 Other long term (current) drug therapy: Secondary | ICD-10-CM | POA: Diagnosis not present

## 2017-01-18 DIAGNOSIS — E119 Type 2 diabetes mellitus without complications: Secondary | ICD-10-CM | POA: Insufficient documentation

## 2017-01-18 DIAGNOSIS — J45909 Unspecified asthma, uncomplicated: Secondary | ICD-10-CM | POA: Insufficient documentation

## 2017-01-18 HISTORY — PX: CHOLECYSTECTOMY: SHX55

## 2017-01-18 LAB — GLUCOSE, CAPILLARY
GLUCOSE-CAPILLARY: 299 mg/dL — AB (ref 65–99)
GLUCOSE-CAPILLARY: 265 mg/dL — AB (ref 65–99)
Glucose-Capillary: 322 mg/dL — ABNORMAL HIGH (ref 65–99)

## 2017-01-18 SURGERY — LAPAROSCOPIC CHOLECYSTECTOMY WITH INTRAOPERATIVE CHOLANGIOGRAM
Anesthesia: General | Wound class: Clean Contaminated

## 2017-01-18 MED ORDER — MIDAZOLAM HCL 2 MG/2ML IJ SOLN
INTRAMUSCULAR | Status: AC
  Filled 2017-01-18: qty 2

## 2017-01-18 MED ORDER — INSULIN ASPART 100 UNIT/ML ~~LOC~~ SOLN
SUBCUTANEOUS | Status: AC
  Filled 2017-01-18: qty 1

## 2017-01-18 MED ORDER — PROMETHAZINE HCL 25 MG/ML IJ SOLN
INTRAMUSCULAR | Status: AC
  Filled 2017-01-18: qty 1

## 2017-01-18 MED ORDER — MIDAZOLAM HCL 2 MG/2ML IJ SOLN
INTRAMUSCULAR | Status: DC | PRN
  Administered 2017-01-18: 2 mg via INTRAVENOUS

## 2017-01-18 MED ORDER — DEXAMETHASONE SODIUM PHOSPHATE 10 MG/ML IJ SOLN
INTRAMUSCULAR | Status: DC | PRN
  Administered 2017-01-18: 10 mg via INTRAVENOUS

## 2017-01-18 MED ORDER — FENTANYL CITRATE (PF) 100 MCG/2ML IJ SOLN
INTRAMUSCULAR | Status: AC
  Filled 2017-01-18: qty 2

## 2017-01-18 MED ORDER — KETOROLAC TROMETHAMINE 30 MG/ML IJ SOLN
INTRAMUSCULAR | Status: DC | PRN
  Administered 2017-01-18: 30 mg via INTRAVENOUS

## 2017-01-18 MED ORDER — INSULIN ASPART 100 UNIT/ML ~~LOC~~ SOLN
5.0000 [IU] | Freq: Once | SUBCUTANEOUS | Status: AC
  Administered 2017-01-18: 5 [IU] via SUBCUTANEOUS

## 2017-01-18 MED ORDER — PROMETHAZINE HCL 25 MG/ML IJ SOLN
6.2500 mg | INTRAMUSCULAR | Status: DC | PRN
  Administered 2017-01-18: 12.5 mg via INTRAVENOUS

## 2017-01-18 MED ORDER — SUGAMMADEX SODIUM 500 MG/5ML IV SOLN
INTRAVENOUS | Status: DC | PRN
  Administered 2017-01-18: 450 mg via INTRAVENOUS

## 2017-01-18 MED ORDER — ACETAMINOPHEN 10 MG/ML IV SOLN
INTRAVENOUS | Status: AC
  Filled 2017-01-18: qty 100

## 2017-01-18 MED ORDER — PROPOFOL 10 MG/ML IV BOLUS
INTRAVENOUS | Status: AC
  Filled 2017-01-18: qty 20

## 2017-01-18 MED ORDER — OXYCODONE HCL 5 MG PO TABS: 5.0000 mg | ORAL_TABLET | Freq: Once | ORAL | Status: DC | PRN

## 2017-01-18 MED ORDER — CEFAZOLIN SODIUM-DEXTROSE 2-4 GM/100ML-% IV SOLN
INTRAVENOUS | Status: AC
  Filled 2017-01-18: qty 100

## 2017-01-18 MED ORDER — FENTANYL CITRATE (PF) 100 MCG/2ML IJ SOLN
25.0000 ug | INTRAMUSCULAR | Status: DC | PRN
  Administered 2017-01-18 (×2): 25 ug via INTRAVENOUS

## 2017-01-18 MED ORDER — LIDOCAINE HCL (CARDIAC) 20 MG/ML IV SOLN
INTRAVENOUS | Status: DC | PRN
  Administered 2017-01-18: 100 mg via INTRAVENOUS

## 2017-01-18 MED ORDER — LIDOCAINE HCL (PF) 2 % IJ SOLN
INTRAMUSCULAR | Status: AC
  Filled 2017-01-18: qty 2

## 2017-01-18 MED ORDER — HYDROCODONE-ACETAMINOPHEN 5-325 MG PO TABS: 1.0000 | ORAL_TABLET | ORAL | 0 refills | Status: DC | PRN

## 2017-01-18 MED ORDER — ROCURONIUM BROMIDE 100 MG/10ML IV SOLN
INTRAVENOUS | Status: DC | PRN
  Administered 2017-01-18: 50 mg via INTRAVENOUS
  Administered 2017-01-18: 20 mg via INTRAVENOUS

## 2017-01-18 MED ORDER — MEPERIDINE HCL 50 MG/ML IJ SOLN: 6.2500 mg | INTRAMUSCULAR | Status: DC | PRN

## 2017-01-18 MED ORDER — DEXAMETHASONE SODIUM PHOSPHATE 10 MG/ML IJ SOLN
INTRAMUSCULAR | Status: AC
  Filled 2017-01-18: qty 1

## 2017-01-18 MED ORDER — SODIUM CHLORIDE 0.9 % IJ SOLN
INTRAMUSCULAR | Status: AC
  Filled 2017-01-18: qty 50

## 2017-01-18 MED ORDER — ACETAMINOPHEN 10 MG/ML IV SOLN
INTRAVENOUS | Status: DC | PRN
  Administered 2017-01-18: 1000 mg via INTRAVENOUS

## 2017-01-18 MED ORDER — ROCURONIUM BROMIDE 50 MG/5ML IV SOLN
INTRAVENOUS | Status: AC
  Filled 2017-01-18: qty 1

## 2017-01-18 MED ORDER — OXYCODONE HCL 5 MG/5ML PO SOLN: 5.0000 mg | Freq: Once | ORAL | Status: DC | PRN

## 2017-01-18 MED ORDER — PROPOFOL 10 MG/ML IV BOLUS
INTRAVENOUS | Status: DC | PRN
  Administered 2017-01-18: 200 mg via INTRAVENOUS

## 2017-01-18 MED ORDER — SUGAMMADEX SODIUM 500 MG/5ML IV SOLN
INTRAVENOUS | Status: AC
  Filled 2017-01-18: qty 5

## 2017-01-18 MED ORDER — SODIUM CHLORIDE 0.9 % IV SOLN
INTRAVENOUS | Status: DC | PRN
  Administered 2017-01-18: 13 mL

## 2017-01-18 MED ORDER — SODIUM CHLORIDE 0.9 % IV SOLN
INTRAVENOUS | Status: DC
  Administered 2017-01-18: 08:00:00 via INTRAVENOUS

## 2017-01-18 MED ORDER — ONDANSETRON HCL 4 MG/2ML IJ SOLN
INTRAMUSCULAR | Status: AC
  Filled 2017-01-18: qty 2

## 2017-01-18 MED ORDER — ONDANSETRON HCL 4 MG/2ML IJ SOLN
INTRAMUSCULAR | Status: DC | PRN
  Administered 2017-01-18: 4 mg via INTRAVENOUS

## 2017-01-18 MED ORDER — KETOROLAC TROMETHAMINE 30 MG/ML IJ SOLN
INTRAMUSCULAR | Status: AC
  Filled 2017-01-18: qty 1

## 2017-01-18 MED ORDER — FENTANYL CITRATE (PF) 100 MCG/2ML IJ SOLN
INTRAMUSCULAR | Status: DC | PRN
  Administered 2017-01-18 (×3): 50 ug via INTRAVENOUS

## 2017-01-18 SURGICAL SUPPLY — 40 items
APPLIER CLIP ROT 10 11.4 M/L (STAPLE) ×2
BENZOIN TINCTURE PRP APPL 2/3 (GAUZE/BANDAGES/DRESSINGS) ×2 IMPLANT
BLADE SURG 11 STRL SS SAFETY (MISCELLANEOUS) ×2 IMPLANT
CANISTER SUCT 1200ML W/VALVE (MISCELLANEOUS) ×2 IMPLANT
CANNULA DILATOR 10 W/SLV (CANNULA) ×2 IMPLANT
CANNULA DILATOR 5 W/SLV (CANNULA) ×4 IMPLANT
CATH CHOLANG 76X19 KUMAR (CATHETERS) ×2 IMPLANT
CHLORAPREP W/TINT 26ML (MISCELLANEOUS) ×2 IMPLANT
CLIP APPLIE ROT 10 11.4 M/L (STAPLE) ×1 IMPLANT
CONRAY 60ML FOR OR (MISCELLANEOUS) ×2 IMPLANT
DISSECTOR KITTNER STICK (MISCELLANEOUS) IMPLANT
DISSECTORS/KITTNER STICK (MISCELLANEOUS)
DRAPE LAPAROTOMY 100X77 ABD (DRAPES) ×2 IMPLANT
DRAPE SHEET LG 3/4 BI-LAMINATE (DRAPES) ×2 IMPLANT
DRSG TEGADERM 2-3/8X2-3/4 SM (GAUZE/BANDAGES/DRESSINGS) ×2 IMPLANT
DRSG TELFA 4X3 1S NADH ST (GAUZE/BANDAGES/DRESSINGS) ×2 IMPLANT
ELECT REM PT RETURN 9FT ADLT (ELECTROSURGICAL) ×2
ELECTRODE REM PT RTRN 9FT ADLT (ELECTROSURGICAL) ×1 IMPLANT
GLOVE BIO SURGEON STRL SZ7.5 (GLOVE) ×6 IMPLANT
GLOVE INDICATOR 8.0 STRL GRN (GLOVE) ×6 IMPLANT
GOWN STRL REUS W/ TWL LRG LVL3 (GOWN DISPOSABLE) ×3 IMPLANT
GOWN STRL REUS W/TWL LRG LVL3 (GOWN DISPOSABLE) ×3
IRRIGATION STRYKERFLOW (MISCELLANEOUS) ×1 IMPLANT
IRRIGATOR STRYKERFLOW (MISCELLANEOUS) ×2
IV LACTATED RINGERS 1000ML (IV SOLUTION) ×2 IMPLANT
KIT RM TURNOVER STRD PROC AR (KITS) ×2 IMPLANT
LABEL OR SOLS (LABEL) ×2 IMPLANT
NDL INSUFF ACCESS 14 VERSASTEP (NEEDLE) ×2 IMPLANT
NS IRRIG 500ML POUR BTL (IV SOLUTION) ×2 IMPLANT
PACK LAP CHOLECYSTECTOMY (MISCELLANEOUS) ×2 IMPLANT
POUCH SPECIMEN RETRIEVAL 10MM (ENDOMECHANICALS) ×2 IMPLANT
SCISSORS METZENBAUM CVD 33 (INSTRUMENTS) ×2 IMPLANT
SEAL FOR SCOPE WARMER C3101 (MISCELLANEOUS) ×2 IMPLANT
STRIP CLOSURE SKIN 1/2X4 (GAUZE/BANDAGES/DRESSINGS) ×2 IMPLANT
SUT VIC AB 0 CT2 27 (SUTURE) ×2 IMPLANT
SUT VIC AB 4-0 FS2 27 (SUTURE) ×2 IMPLANT
SWABSTK COMLB BENZOIN TINCTURE (MISCELLANEOUS) ×2 IMPLANT
TROCAR XCEL NON-BLD 11X100MML (ENDOMECHANICALS) ×2 IMPLANT
TUBING INSUFFLATOR HI FLOW (MISCELLANEOUS) ×2 IMPLANT
WATER STERILE IRR 1000ML POUR (IV SOLUTION) ×2 IMPLANT

## 2017-01-18 NOTE — Discharge Instructions (Addendum)
AMBULATORY SURGERY  DISCHARGE INSTRUCTIONS   1) The drugs that you were given will stay in your system until tomorrow so for the next 24 hours you should not:  A) Drive an automobile B) Make any legal decisions C) Drink any alcoholic beverage   2) You may resume regular meals tomorrow.  Today it is better to start with liquids and gradually work up to solid foods.  You may eat anything you prefer, but it is better to start with liquids, then soup and crackers, and gradually work up to solid foods.   3) Please notify your doctor immediately if you have any unusual bleeding, trouble breathing, redness and pain at the surgery site, drainage, fever, or pain not relieved by medication.    Additional Instructions: Use a pillow on your abdomen for support for coughing, sneezing, laughing.  Take stool softeners along with pain medication to avoid straining with constipation. Keep drinking a lot of fluids.  Be considerate with your food choices.  Stay on a bland diet for the next few days and add foods back to your diet slowly to determine if any particular fatty foods or spicy foods bother you.  Use Tylenol or Ibuprofen in between narcotic doses to enhance the effects of pain medicine.  Be up and around to help eliminate any blood clots but do not do anything strenuous for the next few days.                           MAKE SURE TO USE YOUR CPAP TONIGHT AND EVERY NIGHT AFTER THAT AND AFTER THAT AND AFTER THAT!!!!   Please contact your physician with any problems or Same Day Surgery at [phone], Monday through Friday 6 am to 4 pm, or [facility] number at [phone].

## 2017-01-18 NOTE — Op Note (Signed)
Preoperative diagnosis: Chronic cholecystitis and cholelithiasis.  Postoperative diagnosis: Same.  Operative procedure: Laparoscopic cholecystectomy with intraoperative cholangiograms.  Operative surgeon: Hervey Ard, M.D.  Anesthesia: Gen. endotracheal.  Estimated blood loss: Less than 5 mL.  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 Ultrasound showed evidence of a large stone and thickened gallbladder wall. She was admitted for elective cholecystectomy. She did receive preoperative antibiotics a stone her diabetes. Her blood sugar was elevated the morning of surgery as she had discontinued her metformin 4 days before surgery rather than 2 days as instructed.  She was administered a small dose of subcutaneous insulin prior to the procedure.  Operative note: With the patient under adequate general endotracheal anesthesia the abdomen was prepped with ChloraPrep and draped. An Trendelenburg positioning. He was placed with transmural incision. After assuring intra-abdominal location without any drop test the abdomen was insufflated with CO2 a 10 mmHg pressure. A 10 mm step port was expanded. There was evidence of adhesions of the omentum to the anterior abdominal wall. The initial port had passed through these. They small bowel below appeared unremarkable. The port was able to be navigated into the right lateral abdominal area and there was clear visualization of the right upper quadrant. An 11 mm XL port was placed under direct vision in the epigastric area. 2-5 mm Step ports were placed in the right lateral abdominal wall. From this site the adhesions of the omentum to the anterior abdominal wall near the umbilicus were taken down with cautery dissection for hemostasis. This was well away from the visualized colon. The other local port was freed circumferentially and attention turned towards the bladder.  The patient was placed  into reverse Trendelenburg position and rolled to the left. The gallbladder was placed on cephalad traction. Chronic inflammation was noted. The neck of the gallbladder was cleared and the cystic duct isolated. A Kumar clamp was placed and cholangiogram were completed using 13 mL of one half strength Conray 60. This showed prompt reflux in the hepatic ducts and free flow of common bile duct and duodenum. No evidence of retained stones. The cystic duct and the cystic artery were doubly clipped and divided. The gallbladder is removed from the liver bed making use of hook cautery dissection. It was delivered into an Endo Catch bag. This was then brought to the umbilical site and the gallbladder extracted after crushing the large stone. After reestablishing pneumoperitoneum the right upper quadrant was irrigated with lactated Ringer's solution. Good hemostasis was noted. The abdomen was then desufflated and ports removed under direct vision. The umbilical fascia was approximated with a single 0 Vicryl figure-of-eight suture. Skin incisions were closed with 4-0 Vicryl septic sutures. Benzoin, Steri-Strips, Telfa and Tegaderm dressings were applied.  The patient tolerated the procedure well and was taken the recovery room in stable condition.

## 2017-01-18 NOTE — Progress Notes (Signed)
Patient states she feels a little nauseated, removed Warm blankets and prepared to give medication For nausea.

## 2017-01-18 NOTE — Anesthesia Procedure Notes (Addendum)
Procedure Name: Intubation Date/Time: 01/18/2017 8:58 AM Performed by: Doreen Salvage Pre-anesthesia Checklist: Patient identified, Patient being monitored, Timeout performed, Emergency Drugs available and Suction available Patient Re-evaluated:Patient Re-evaluated prior to induction Oxygen Delivery Method: Circle system utilized Preoxygenation: Pre-oxygenation with 100% oxygen Induction Type: IV induction Ventilation: Mask ventilation without difficulty and Oral airway inserted - appropriate to patient size Laryngoscope Size: Mac and 3 Grade View: Grade I Tube type: Oral Tube size: 7.0 mm Number of attempts: 1 Airway Equipment and Method: Stylet Placement Confirmation: ETT inserted through vocal cords under direct vision,  positive ETCO2 and breath sounds checked- equal and bilateral Secured at: 21 cm Tube secured with: Tape Dental Injury: Teeth and Oropharynx as per pre-operative assessment

## 2017-01-18 NOTE — Transfer of Care (Signed)
Immediate Anesthesia Transfer of Care Note  Patient: Tricia Hill  Procedure(s) Performed: Procedure(s): LAPAROSCOPIC CHOLECYSTECTOMY WITH INTRAOPERATIVE CHOLANGIOGRAM (N/A)  Patient Location: PACU  Anesthesia Type:General  Level of Consciousness: sedated  Airway & Oxygen Therapy: Patient Spontanous Breathing and Patient connected to face mask oxygen  Post-op Assessment: Report given to RN and Post -op Vital signs reviewed and stable  Post vital signs: Reviewed and stable  Last Vitals:  Vitals:   01/18/17 0740 01/18/17 1012  BP: (!) 157/97 135/81  Pulse: 95 92  Resp: 16 (!) 27  Temp: 37.5 C 36.8 C  SpO2: 32% 76%    Complications: No apparent anesthesia complications

## 2017-01-18 NOTE — Progress Notes (Signed)
Dr. Randa Lynn aware of fingerstick, no further orders Of insulin.

## 2017-01-18 NOTE — H&P (Signed)
More RUQ discomfort last 24 hours. Stopped Metformin 4 days before surgery, and BS elevated today. Usually run 140 in AM. For cholecystectomy today.

## 2017-01-18 NOTE — OR Nursing (Signed)
To [facility] for void on arrival to postop and up to recliner, toler well.  Denies nausea and pain at present.

## 2017-01-18 NOTE — Progress Notes (Signed)
Inpatient Diabetes Program Recommendations  AACE/ADA: New Consensus Statement on Inpatient Glycemic Control (2015)  Target Ranges:  Prepandial:   less than 140 mg/dL      Peak postprandial:   less than 180 mg/dL (1-2 hours)      Critically ill patients:  140 - 180 mg/dL   Lab Results  Component Value Date   GLUCAP 265 (H) 01/18/2017   HGBA1C 7.7 05/15/2016    Review of Glycemic Control  Results for BONNITA, NEWBY (MRN [id number]) as of 01/18/2017 12:31  Ref. Range 01/18/2017 07:43 01/18/2017 08:37 01/18/2017 10:27  Glucose-Capillary Latest Ref Range: 65 - 99 mg/dL 322 (H) 299 (H) 265 (H)    Diabetes history: Type 2 Outpatient Diabetes medications: Metformin 1000mg  bid Current orders for Inpatient glycemic control: none- Novolog 5 units given x1  Inpatient Diabetes Program Recommendations:  If patient remains in hospital overnight, please consider ordering Novolog 0-9 units q4h while patient is NPO, then Novolog 0-9 units tid and Novolog 0-5 units qhs once she is eating  Anticipate elevated CBG since patient received steroids.  Gentry Fitz, RN, BA, MHA, CDE Diabetes Coordinator Inpatient Diabetes Program  [phone] (Team Pager) [phone] ([place]) 01/18/2017 12:33 PM

## 2017-01-18 NOTE — OR Nursing (Signed)
Pt awake, denies nausea, advise pain "only when I move", taking ice chips, verbalizes understanding of increasing fluids when getting home.  Per Cleatis Polka, RN, Dr. Bary Castilla in earlier to see patient and advised ok to d/c her to home.

## 2017-01-18 NOTE — Anesthesia Preprocedure Evaluation (Signed)
Anesthesia Evaluation  Patient identified by MRN, date of birth, ID band Patient awake    Reviewed: Allergy & Precautions, NPO status , Patient's Chart, lab work & pertinent test results  History of Anesthesia Complications Negative for: history of anesthetic complications  Airway Mallampati: III  TM Distance: >3 FB Neck ROM: Full    Dental no notable dental hx.    Pulmonary asthma , sleep apnea and Continuous Positive Airway Pressure Ventilation ,    breath sounds clear to auscultation- rhonchi (-) wheezing      Cardiovascular Exercise Tolerance: Good hypertension, (-) CAD, (-) Past MI and (-) Cardiac Stents  Rhythm:Regular Rate:Normal - Systolic murmurs and - Diastolic murmurs    Neuro/Psych negative neurological ROS  negative psych ROS   GI/Hepatic Neg liver ROS, GERD  ,  Endo/Other  diabetes, Oral Hypoglycemic Agents  Renal/GU negative Renal ROS     Musculoskeletal negative musculoskeletal ROS (+)   Abdominal (+) + obese,   Peds  Hematology negative hematology ROS (+) DOES NOT REFUSE BLOOD PRODUCTS,   Anesthesia Other Findings Past Medical History: No date: Asthma 2016: Diabetes mellitus without complication (HCC) No date: GERD (gastroesophageal reflux disease) No date: Heart murmur     Comment:  child No date: Hypertension No date: Shingles No date: Sleep apnea     Comment:  USE CPAP   Reproductive/Obstetrics                             Anesthesia Physical Anesthesia Plan  ASA: III  Anesthesia Plan: General   Post-op Pain Management:    Induction: Intravenous  PONV Risk Score and Plan: 2 and Ondansetron and Dexamethasone  Airway Management Planned: Oral ETT  Additional Equipment:   Intra-op Plan:   Post-operative Plan: Extubation in OR  Informed Consent: I have reviewed the patients History and Physical, chart, labs and discussed the procedure including the  risks, benefits and alternatives for the proposed anesthesia with the patient or authorized representative who has indicated his/her understanding and acceptance.   Dental advisory given  Plan Discussed with: CRNA and Anesthesiologist  Anesthesia Plan Comments: (Blood sugar 320 this morning, will treat with [place] insulin prior to surgery)         Anesthesia Quick Evaluation

## 2017-01-18 NOTE — Anesthesia Post-op Follow-up Note (Signed)
Anesthesia QCDR form completed.        

## 2017-01-18 NOTE — Anesthesia Postprocedure Evaluation (Signed)
Anesthesia Post Note  Patient: Tricia Hill  Procedure(s) Performed: Procedure(s) (LRB): LAPAROSCOPIC CHOLECYSTECTOMY WITH INTRAOPERATIVE CHOLANGIOGRAM (N/A)  Patient location during evaluation: PACU Anesthesia Type: General Level of consciousness: awake and alert and oriented Pain management: pain level controlled Vital Signs Assessment: post-procedure vital signs reviewed and stable Respiratory status: spontaneous breathing, nonlabored ventilation and respiratory function stable Cardiovascular status: blood pressure returned to baseline and stable Postop Assessment: no signs of nausea or vomiting Anesthetic complications: no     Last Vitals:  Vitals:   01/18/17 1027 01/18/17 1045  BP: 123/82 115/60  Pulse: 85 86  Resp: 20 (!) 23  Temp:    SpO2: 99% 95%    Last Pain:  Vitals:   01/18/17 1045  TempSrc:   PainSc: 2                  Eissa Buchberger

## 2017-01-21 ENCOUNTER — Encounter: Payer: Self-pay | Admitting: General Surgery

## 2017-01-21 LAB — SURGICAL PATHOLOGY

## 2017-01-29 ENCOUNTER — Ambulatory Visit (INDEPENDENT_AMBULATORY_CARE_PROVIDER_SITE_OTHER): Payer: 59 | Admitting: General Surgery

## 2017-01-29 ENCOUNTER — Encounter: Payer: Self-pay | Admitting: General Surgery

## 2017-01-29 VITALS — BP 124/68 | HR 94 | Resp 16 | Ht 60.0 in | Wt 249.0 lb

## 2017-01-29 DIAGNOSIS — K8020 Calculus of gallbladder without cholecystitis without obstruction: Secondary | ICD-10-CM

## 2017-01-29 NOTE — Patient Instructions (Signed)
The patient is aware to call back for any questions or concerns.  

## 2017-01-29 NOTE — Progress Notes (Signed)
Patient ID: Tricia Hill, female   DOB: 1971/03/29, 46 y.o.   MRN: [id number]  Chief Complaint  Patient presents with  . Routine Post Op    HPI Tricia Hill is a 46 y.o. female.  Here today for postoperative visit, laparoscopic cholecystectomy done 01-18-17, she states she is doing well.  Denies any gastrointestinal issues, bowels are moving regular.  HPI  Past Medical History:  Diagnosis Date  . Asthma   . Diabetes mellitus without complication ([place]) 7341  . GERD (gastroesophageal reflux disease)   . Heart murmur    child  . Hypertension   . Shingles   . Sleep apnea    USE CPAP    Past Surgical History:  Procedure Laterality Date  . ABDOMINAL HYSTERECTOMY     Partial  . CESAREAN SECTION     X 2  . CHOLECYSTECTOMY N/A 01/18/2017   Procedure: LAPAROSCOPIC CHOLECYSTECTOMY WITH INTRAOPERATIVE CHOLANGIOGRAM;  Surgeon: Robert Bellow, MD;  Location: [facility] ORS;  Service: General;  Laterality: N/A;  . DIAGNOSTIC LAPAROSCOPY  2006   EXCISION OF ABDOMINAL WALL ENDOMETRIOMA  . SPINAL FUSION  2012   C3 and C4    Family History  Problem Relation Age of Onset  . Hypertension Mother   . Diabetes Mother   . Hypertension Father   . Graves' disease Sister   . Clotting disorder Brother   . Heart disease Maternal Grandmother   . Heart disease Maternal Grandfather   . Kidney disease Neg Hx   . Breast cancer Neg Hx     Social History Social History  Substance Use Topics  . Smoking status: Never Smoker  . Smokeless tobacco: Never Used  . Alcohol use Yes     Comment: on occasion    Allergies  Allergen Reactions  . Ultram [Tramadol Hcl] Nausea And Vomiting    Severe vomiting   . Victoza [Liraglutide] Nausea And Vomiting    Current Outpatient Prescriptions  Medication Sig Dispense Refill  . atorvastatin (LIPITOR) 40 MG tablet Take 1 tablet (40 mg total) by mouth daily. (Patient taking differently: Take 40 mg by mouth every morning. ) 90 tablet 1  . glucose blood  (FREESTYLE LITE) test strip USE AS DIRECTED 100 each 12  . glucose monitoring kit (FREESTYLE) monitoring kit 1 each by Does not apply route as needed for other. 1 each 0  . Lancets (FREESTYLE) lancets Use as instructed 100 each 12  . lisinopril (ZESTRIL) 2.5 MG tablet Take 1 tablet (2.5 mg total) by mouth daily. 90 tablet 1  . metFORMIN (GLUCOPHAGE) 1000 MG tablet Take 1 tablet (1,000 mg total) by mouth 2 (two) times daily. 180 tablet 1  . omeprazole (PRILOSEC) 20 MG capsule Take 1 capsule (20 mg total) by mouth daily. (Patient taking differently: Take 20 mg by mouth every morning. ) 90 capsule 12  . ondansetron (ZOFRAN ODT) 4 MG disintegrating tablet Take 1 tablet (4 mg total) by mouth every 6 (six) hours as needed for nausea or vomiting. 20 tablet 0  . Potassium Chloride ER 20 MEQ TBCR Take 20 mEq by mouth daily. 30 tablet 0  . PROAIR HFA 108 (90 BASE) MCG/ACT inhaler Inhale 2 puffs into the lungs every 4 (four) hours as needed for wheezing or shortness of breath.   0  . Semaglutide (OZEMPIC) 1 MG/DOSE SOPN Inject 1 mg into the skin once a week. 1.5 mL 6   No current facility-administered medications for this visit.     Review of  Systems Review of Systems  Constitutional: Negative.   Respiratory: Negative.   Cardiovascular: Negative.     Blood pressure 124/68, pulse 94, resp. rate 16, height 5' (1.524 m), weight 249 lb (112.9 kg).  Physical Exam Physical Exam  Constitutional: She is oriented to person, place, and time. She appears well-developed and well-nourished.  HENT:  Mouth/Throat: Oropharynx is clear and moist.  Eyes: Conjunctivae are normal. No scleral icterus.  Neck: Neck supple.  Cardiovascular: Normal rate, regular rhythm and normal heart sounds.   Pulmonary/Chest: Effort normal and breath sounds normal.  Abdominal: Soft. Bowel sounds are normal. There is no tenderness.  Lymphadenopathy:    She has no cervical adenopathy.  Neurological: She is alert and oriented to  person, place, and time.  Skin: Skin is warm and dry.  Psychiatric: Her behavior is normal.    Data Reviewed 03/20/2017 pathology:  :  A. GALLBLADDER; CHOLECYSTECTOMY:  - CHRONIC CHOLECYSTITIS WITH CHOLELITHIASIS.  - CHOLESTEROLOSIS.  - NEGATIVE FOR MALIGNANCY.    Assessment    Doing well status post cholecystectomy.    Plan        Follow up as needed. Proper lifting techniques reviewed. Resume activities as tolerated. The patient is aware to call back for any questions or new concerns.   HPI, Physical Exam, Assessment and Plan have been scribed under the direction and in the presence of Robert Bellow, MD. Karie Fetch, RN  I have completed the exam and reviewed the above documentation for accuracy and completeness.  I agree with the above.  Haematologist has been used and any errors in dictation or transcription are unintentional.  Hervey Ard, M.D., F.A.C.S.   Robert Bellow 01/29/2017, 9:19 PM

## 2017-02-28 ENCOUNTER — Encounter: Payer: Self-pay | Admitting: Family Medicine

## 2017-02-28 ENCOUNTER — Ambulatory Visit (INDEPENDENT_AMBULATORY_CARE_PROVIDER_SITE_OTHER): Payer: 59 | Admitting: Family Medicine

## 2017-02-28 VITALS — BP 133/84 | HR 80 | Temp 98.7°F | Wt 243.8 lb

## 2017-02-28 DIAGNOSIS — R3129 Other microscopic hematuria: Secondary | ICD-10-CM

## 2017-02-28 DIAGNOSIS — E1165 Type 2 diabetes mellitus with hyperglycemia: Secondary | ICD-10-CM | POA: Diagnosis not present

## 2017-02-28 DIAGNOSIS — R319 Hematuria, unspecified: Secondary | ICD-10-CM | POA: Diagnosis not present

## 2017-02-28 DIAGNOSIS — R030 Elevated blood-pressure reading, without diagnosis of hypertension: Secondary | ICD-10-CM | POA: Diagnosis not present

## 2017-02-28 DIAGNOSIS — E785 Hyperlipidemia, unspecified: Secondary | ICD-10-CM

## 2017-02-28 DIAGNOSIS — E1169 Type 2 diabetes mellitus with other specified complication: Secondary | ICD-10-CM

## 2017-02-28 LAB — MICROALBUMIN, URINE WAIVED
Creatinine, Urine Waived: 200 mg/dL (ref 10–300)
Microalb, Ur Waived: 30 mg/L — ABNORMAL HIGH (ref 0–19)

## 2017-02-28 LAB — BAYER DCA HB A1C WAIVED: HB A1C: 8 % — AB (ref <7.0)

## 2017-02-28 MED ORDER — LISINOPRIL 2.5 MG PO TABS: 2.5000 mg | ORAL_TABLET | Freq: Every day | ORAL | 1 refills | Status: DC

## 2017-02-28 MED ORDER — METFORMIN HCL 1000 MG PO TABS: 1000.0000 mg | ORAL_TABLET | Freq: Two times a day (BID) | ORAL | 1 refills | Status: DC

## 2017-02-28 MED ORDER — ATORVASTATIN CALCIUM 40 MG PO TABS: 40.0000 mg | ORAL_TABLET | Freq: Every day | ORAL | 1 refills | Status: DC

## 2017-02-28 NOTE — Progress Notes (Signed)
BP 133/84   Pulse 80   Temp 98.7 F (37.1 C)   Wt 243 lb 12.8 oz (110.6 kg)   LMP  (LMP Unknown)   SpO2 98%   BMI 47.61 kg/m    Subjective:    Patient ID: Tricia Hill, female    DOB: [birth year], 46 y.o.   MRN: [id number]  HPI: DONNETTE Hill is a 46 y.o. female  Chief Complaint  Patient presents with  . Diabetes  . Hyperlipidemia  . Hypertension   DIABETES- has been forgetting her metformin and lipitor because she just forgets Hypoglycemic episodes:no Polydipsia/polyuria: no Visual disturbance: no Chest pain: no Paresthesias: no Glucose Monitoring: yes  Accucheck frequency: Daily Taking Insulin?: no Blood Pressure Monitoring: not checking Retinal Examination: Up to Date Foot Exam: Up to Date Diabetic Education: Completed Pneumovax: Up to Date Influenza: Up to Date Aspirin: yes  HYPERTENSION / HYPERLIPIDEMIA Satisfied with current treatment? yes Duration of hypertension: chronic BP monitoring frequency: not checking BP range:  BP medication side effects: no Past BP meds: lisinopril Duration of hyperlipidemia: chronic Cholesterol medication side effects: no Cholesterol supplements: none Past cholesterol medications: atorvastain (lipitor) Medication compliance: excellent compliance Aspirin: no Recent stressors: yes Recurrent headaches: no Visual changes: no Palpitations: no Dyspnea: no Chest pain: no Lower extremity edema: no Dizzy/lightheaded: no  Relevant past medical, surgical, family and social history reviewed and updated as indicated. Interim medical history since our last visit reviewed. Allergies and medications reviewed and updated.  Review of Systems  Constitutional: Negative.   Respiratory: Negative.   Cardiovascular: Negative.   Psychiatric/Behavioral: Negative.     Per HPI unless specifically indicated above     Objective:    BP 133/84   Pulse 80   Temp 98.7 F (37.1 C)   Wt 243 lb 12.8 oz (110.6 kg)   LMP  (LMP  Unknown)   SpO2 98%   BMI 47.61 kg/m   Wt Readings from Last 3 Encounters:  02/28/17 243 lb 12.8 oz (110.6 kg)  01/29/17 249 lb (112.9 kg)  12/19/16 251 lb (113.9 kg)    Physical Exam  Constitutional: She is oriented to person, place, and time. She appears well-developed and well-nourished. No distress.  HENT:  Head: Normocephalic and atraumatic.  Right Ear: Hearing normal.  Left Ear: Hearing normal.  Nose: Nose normal.  Eyes: Conjunctivae and lids are normal. Right eye exhibits no discharge. Left eye exhibits no discharge. No scleral icterus.  Cardiovascular: Normal rate, regular rhythm, normal heart sounds and intact distal pulses.  Exam reveals no gallop and no friction rub.   No murmur heard. Pulmonary/Chest: Effort normal and breath sounds normal. No respiratory distress. She has no wheezes. She has no rales. She exhibits no tenderness.  Musculoskeletal: Normal range of motion.  Neurological: She is alert and oriented to person, place, and time.  Skin: Skin is warm, dry and intact. No rash noted. She is not diaphoretic. No erythema. No pallor.  Psychiatric: She has a normal mood and affect. Her speech is normal and behavior is normal. Judgment and thought content normal. Cognition and memory are normal.  Nursing note and vitals reviewed.  Diabetic Foot Exam - Simple   Simple Foot Form Diabetic Foot exam was performed with the following findings:  Yes 02/28/2017 10:19 AM  Visual Inspection No deformities, no ulcerations, no other skin breakdown bilaterally:  Yes Sensation Testing Intact to touch and monofilament testing bilaterally:  Yes Pulse Check Posterior Tibialis and Dorsalis pulse intact bilaterally:  Yes Comments     Results for orders placed or performed during the hospital encounter of 95/09/32  Basic metabolic panel  Result Value Ref Range   Sodium 136 135 - 145 mmol/L   Potassium 3.4 (L) 3.5 - 5.1 mmol/L   Chloride 99 (L) 101 - 111 mmol/L   CO2 28 22 - 32  mmol/L   Glucose, Bld 237 (H) 65 - 99 mg/dL   BUN 11 6 - 20 mg/dL   Creatinine, Ser 0.47 0.44 - 1.00 mg/dL   Calcium 8.7 (L) 8.9 - 10.3 mg/dL   GFR calc non Af Amer >60 >60 mL/min   GFR calc Af Amer >60 >60 mL/min   Anion gap 9 5 - 15      Assessment & Plan:   Problem List Items Addressed This Visit      Endocrine   Uncontrolled diabetes mellitus ([place]) - Primary    Doing better with an A1c of 8.0. Not taking her metformin regularly. Discussed strategies to help with this. Recheck levels in 3 months.       Relevant Medications   lisinopril (ZESTRIL) 2.5 MG tablet   atorvastatin (LIPITOR) 40 MG tablet   metFORMIN (GLUCOPHAGE) 1000 MG tablet   Other Relevant Orders   Bayer DCA Hb A1c Waived   Comprehensive metabolic panel   Microalbumin, Urine Waived   Hyperlipidemia associated with type 2 diabetes mellitus (HCC)    Rechecking levels today. Continue current regimen. Continue to monitor. Call with any concerns.       Relevant Medications   lisinopril (ZESTRIL) 2.5 MG tablet   atorvastatin (LIPITOR) 40 MG tablet   metFORMIN (GLUCOPHAGE) 1000 MG tablet   Other Relevant Orders   Lipid Panel w/o Chol/HDL Ratio   Comprehensive metabolic panel     Genitourinary   Microscopic hematuria    Rechecking levels today. Await results. Call with any concerns.       Relevant Orders   UA/M w/rflx Culture, Routine     Other   Elevated BP without diagnosis of hypertension    Under good control today. Continue current regimen and call with any concerns.       Relevant Orders   Comprehensive metabolic panel   Microalbumin, Urine Waived       Follow up plan: Return in about 3 months (around 05/30/2017) for DM follow up.

## 2017-02-28 NOTE — Assessment & Plan Note (Signed)
Rechecking levels today. Await results. Call with any concerns.  

## 2017-02-28 NOTE — Assessment & Plan Note (Signed)
Under good control today. Continue current regimen and call with any concerns.

## 2017-02-28 NOTE — Assessment & Plan Note (Signed)
Rechecking levels today. Continue current regimen. Continue to monitor. Call with any concerns.  

## 2017-02-28 NOTE — Assessment & Plan Note (Signed)
Doing better with an A1c of 8.0. Not taking her metformin regularly. Discussed strategies to help with this. Recheck levels in 3 months.

## 2017-03-01 LAB — COMPREHENSIVE METABOLIC PANEL
A/G RATIO: 1.6 (ref 1.2–2.2)
ALBUMIN: 4.1 g/dL (ref 3.5–5.5)
ALT: 27 IU/L (ref 0–32)
AST: 22 IU/L (ref 0–40)
Alkaline Phosphatase: 102 IU/L (ref 39–117)
BILIRUBIN TOTAL: 0.3 mg/dL (ref 0.0–1.2)
BUN / CREAT RATIO: 14 (ref 9–23)
BUN: 9 mg/dL (ref 6–24)
CHLORIDE: 101 mmol/L (ref 96–106)
CO2: 22 mmol/L (ref 20–29)
Calcium: 9.1 mg/dL (ref 8.7–10.2)
Creatinine, Ser: 0.64 mg/dL (ref 0.57–1.00)
GFR calc non Af Amer: 109 mL/min/{1.73_m2} (ref >59)
GFR, EST AFRICAN AMERICAN: 126 mL/min/{1.73_m2} (ref >59)
GLOBULIN, TOTAL: 2.6 g/dL (ref 1.5–4.5)
Glucose: 215 mg/dL — ABNORMAL HIGH (ref 65–99)
POTASSIUM: 3.9 mmol/L (ref 3.5–5.2)
SODIUM: 140 mmol/L (ref 134–144)
TOTAL PROTEIN: 6.7 g/dL (ref 6.0–8.5)

## 2017-03-01 LAB — LIPID PANEL W/O CHOL/HDL RATIO
CHOLESTEROL TOTAL: 196 mg/dL (ref 100–199)
HDL: 43 mg/dL (ref >39)
LDL CALC: 110 mg/dL — AB (ref 0–99)
TRIGLYCERIDES: 215 mg/dL — AB (ref 0–149)
VLDL Cholesterol Cal: 43 mg/dL — ABNORMAL HIGH (ref 5–40)

## 2017-03-07 ENCOUNTER — Other Ambulatory Visit: Payer: Self-pay | Admitting: Family Medicine

## 2017-03-07 LAB — UA/M W/RFLX CULTURE, ROUTINE
Bilirubin, UA: NEGATIVE
Glucose, UA: NEGATIVE
Ketones, UA: NEGATIVE
NITRITE UA: NEGATIVE
PH UA: 5 (ref 5.0–7.5)
PROTEIN UA: NEGATIVE
Specific Gravity, UA: 1.025 (ref 1.005–1.030)
Urobilinogen, Ur: 0.2 mg/dL (ref 0.2–1.0)

## 2017-03-07 LAB — MICROSCOPIC EXAMINATION

## 2017-03-07 LAB — URINE CULTURE, REFLEX

## 2017-03-07 MED ORDER — NITROFURANTOIN MONOHYD MACRO 100 MG PO CAPS: 100.0000 mg | ORAL_CAPSULE | Freq: Two times a day (BID) | ORAL | 0 refills | Status: DC

## 2017-03-07 NOTE — Progress Notes (Signed)
Rx sent to her pharmacy 

## 2017-04-22 ENCOUNTER — Encounter: Payer: Self-pay | Admitting: General Surgery

## 2017-05-16 ENCOUNTER — Encounter: Payer: 59 | Admitting: Family Medicine

## 2017-05-30 ENCOUNTER — Ambulatory Visit: Payer: 59 | Admitting: Family Medicine

## 2017-06-17 ENCOUNTER — Ambulatory Visit (INDEPENDENT_AMBULATORY_CARE_PROVIDER_SITE_OTHER): Payer: 59 | Admitting: Family Medicine

## 2017-06-17 ENCOUNTER — Encounter: Payer: Self-pay | Admitting: Family Medicine

## 2017-06-17 VITALS — BP 125/83 | HR 85 | Temp 98.6°F | Wt 242.1 lb

## 2017-06-17 DIAGNOSIS — E1165 Type 2 diabetes mellitus with hyperglycemia: Secondary | ICD-10-CM | POA: Diagnosis not present

## 2017-06-17 LAB — BAYER DCA HB A1C WAIVED: HB A1C (BAYER DCA - WAIVED): 8.1 % — ABNORMAL HIGH (ref <7.0)

## 2017-06-17 NOTE — Progress Notes (Signed)
BP 125/83 (BP Location: Left Arm, Patient Position: Sitting, Cuff Size: Large)   Pulse 85   Temp 98.6 F (37 C)   Wt 242 lb 1 oz (109.8 kg)   LMP  (LMP Unknown)   SpO2 96%   BMI 47.27 kg/m    Subjective:    Patient ID: Tricia Hill, female    DOB: Feb 29, 1971, 47 y.o.   MRN: [id number]  HPI: Tricia Hill is a 47 y.o. female  Chief Complaint  Patient presents with  . Diabetes   DIABETES- forgets to take her medicine. Takes her metformin 3x a week, taking her injection Hypoglycemic episodes:no Polydipsia/polyuria: yes Visual disturbance: no Chest pain: no Paresthesias: no Glucose Monitoring: no Taking Insulin?: no Blood Pressure Monitoring: not checking Retinal Examination: Not up to Date Foot Exam: Up to Date Diabetic Education: Completed Pneumovax: Up to Date Influenza: Up to Date Aspirin: no  Relevant past medical, surgical, family and social history reviewed and updated as indicated. Interim medical history since our last visit reviewed. Allergies and medications reviewed and updated.  Review of Systems  Constitutional: Negative.   Respiratory: Negative.   Cardiovascular: Negative.   Psychiatric/Behavioral: Negative for agitation, behavioral problems, confusion, decreased concentration, dysphoric mood, hallucinations and self-injury. The patient is nervous/anxious. The patient is not hyperactive.     Per HPI unless specifically indicated above     Objective:    BP 125/83 (BP Location: Left Arm, Patient Position: Sitting, Cuff Size: Large)   Pulse 85   Temp 98.6 F (37 C)   Wt 242 lb 1 oz (109.8 kg)   LMP  (LMP Unknown)   SpO2 96%   BMI 47.27 kg/m   Wt Readings from Last 3 Encounters:  06/17/17 242 lb 1 oz (109.8 kg)  02/28/17 243 lb 12.8 oz (110.6 kg)  01/29/17 249 lb (112.9 kg)    Physical Exam  Constitutional: She is oriented to person, place, and time. She appears well-developed and well-nourished. No distress.  HENT:  Head:  Normocephalic and atraumatic.  Right Ear: Hearing normal.  Left Ear: Hearing normal.  Nose: Nose normal.  Eyes: Conjunctivae and lids are normal. Right eye exhibits no discharge. Left eye exhibits no discharge. No scleral icterus.  Cardiovascular: Normal rate, regular rhythm, normal heart sounds and intact distal pulses. Exam reveals no gallop and no friction rub.  No murmur heard. Pulmonary/Chest: Effort normal and breath sounds normal. No respiratory distress. She has no wheezes. She has no rales. She exhibits no tenderness.  Musculoskeletal: Normal range of motion.  Neurological: She is alert and oriented to person, place, and time.  Skin: Skin is warm, dry and intact. No rash noted. She is not diaphoretic. No erythema. No pallor.  Psychiatric: She has a normal mood and affect. Her speech is normal and behavior is normal. Judgment and thought content normal. Cognition and memory are normal.  Nursing note and vitals reviewed.   Results for orders placed or performed in visit on 02/28/17  Microscopic Examination  Result Value Ref Range   WBC, UA 0-5 0 - 5 /hpf   RBC, UA 0-2 0 - 2 /hpf   Epithelial Cells (non renal) 0-10 0 - 10 /hpf   Bacteria, UA Moderate (A) None seen/Few  Urine Culture, Reflex  Result Value Ref Range   Urine Culture, Routine Final report (A)    Organism ID, Bacteria Klebsiella pneumoniae (A)    Antimicrobial Susceptibility Comment   Bayer DCA Hb A1c Waived  Result Value Ref  Range   Bayer DCA Hb A1c Waived 8.0 (H) <7.0 %  Lipid Panel w/o Chol/HDL Ratio  Result Value Ref Range   Cholesterol, Total 196 100 - 199 mg/dL   Triglycerides 215 (H) 0 - 149 mg/dL   HDL 43 >39 mg/dL   VLDL Cholesterol Cal 43 (H) 5 - 40 mg/dL   LDL Calculated 110 (H) 0 - 99 mg/dL  Comprehensive metabolic panel  Result Value Ref Range   Glucose 215 (H) 65 - 99 mg/dL   BUN 9 6 - 24 mg/dL   Creatinine, Ser 0.64 0.57 - 1.00 mg/dL   GFR calc non Af Amer 109 >59 mL/min/1.73   GFR calc Af  Amer 126 >59 mL/min/1.73   BUN/Creatinine Ratio 14 9 - 23   Sodium 140 134 - 144 mmol/L   Potassium 3.9 3.5 - 5.2 mmol/L   Chloride 101 96 - 106 mmol/L   CO2 22 20 - 29 mmol/L   Calcium 9.1 8.7 - 10.2 mg/dL   Total Protein 6.7 6.0 - 8.5 g/dL   Albumin 4.1 3.5 - 5.5 g/dL   Globulin, Total 2.6 1.5 - 4.5 g/dL   Albumin/Globulin Ratio 1.6 1.2 - 2.2   Bilirubin Total 0.3 0.0 - 1.2 mg/dL   Alkaline Phosphatase 102 39 - 117 IU/L   AST 22 0 - 40 IU/L   ALT 27 0 - 32 IU/L  Microalbumin, Urine Waived  Result Value Ref Range   Microalb, Ur Waived 30 (H) 0 - 19 mg/L   Creatinine, Urine Waived 200 10 - 300 mg/dL   Microalb/Creat Ratio <30 <30 mg/g  UA/M w/rflx Culture, Routine  Result Value Ref Range   Specific Gravity, UA 1.025 1.005 - 1.030   pH, UA 5.0 5.0 - 7.5   Color, UA Yellow Yellow   Appearance Ur Turbid (A) Clear   Leukocytes, UA 1+ (A) Negative   Protein, UA Negative Negative/Trace   Glucose, UA Negative Negative   Ketones, UA Negative Negative   RBC, UA 2+ (A) Negative   Bilirubin, UA Negative Negative   Urobilinogen, Ur 0.2 0.2 - 1.0 mg/dL   Nitrite, UA Negative Negative   Microscopic Examination See below:    Urinalysis Reflex Comment       Assessment & Plan:   Problem List Items Addressed This Visit      Endocrine   Uncontrolled diabetes mellitus ([place]) - Primary    Not under great control with A1c of 8.1. Discussed importance of taking her metformin. Recheck 3 months      Relevant Orders   Bayer DCA Hb A1c Waived     Other   Morbid obesity ([place])    Congratulated patient on continued weight loss. Continue to monitor. Call with any concerns. Continue slow weight loss of 1-2lbs a week.          Follow up plan: Return in about 3 months (around 09/15/2017) for DM and cholesterol follow up.

## 2017-06-17 NOTE — Assessment & Plan Note (Signed)
Congratulated patient on continued weight loss. Continue to monitor. Call with any concerns. Continue slow weight loss of 1-2lbs a week.

## 2017-06-17 NOTE — Assessment & Plan Note (Addendum)
Not under great control with A1c of 8.1. Discussed importance of taking her metformin. Recheck 3 months

## 2017-08-07 ENCOUNTER — Other Ambulatory Visit: Payer: Self-pay | Admitting: Family Medicine

## 2017-09-16 ENCOUNTER — Ambulatory Visit: Payer: 59 | Admitting: Family Medicine

## 2017-09-19 ENCOUNTER — Ambulatory Visit: Payer: 59 | Admitting: Family Medicine

## 2017-09-26 ENCOUNTER — Ambulatory Visit: Payer: 59 | Admitting: Family Medicine

## 2017-09-26 ENCOUNTER — Encounter: Payer: Self-pay | Admitting: Family Medicine

## 2017-09-26 VITALS — BP 145/90 | HR 85 | Temp 98.5°F | Wt 240.2 lb

## 2017-09-26 DIAGNOSIS — E785 Hyperlipidemia, unspecified: Secondary | ICD-10-CM

## 2017-09-26 DIAGNOSIS — E1165 Type 2 diabetes mellitus with hyperglycemia: Secondary | ICD-10-CM | POA: Diagnosis not present

## 2017-09-26 DIAGNOSIS — R030 Elevated blood-pressure reading, without diagnosis of hypertension: Secondary | ICD-10-CM | POA: Diagnosis not present

## 2017-09-26 DIAGNOSIS — E1169 Type 2 diabetes mellitus with other specified complication: Secondary | ICD-10-CM

## 2017-09-26 LAB — BAYER DCA HB A1C WAIVED: HB A1C: 7.8 % — AB (ref <7.0)

## 2017-09-26 MED ORDER — FREESTYLE LANCETS MISC: 1.0000 | 12 refills | Status: DC

## 2017-09-26 MED ORDER — ATORVASTATIN CALCIUM 40 MG PO TABS: 40.0000 mg | ORAL_TABLET | Freq: Every day | ORAL | 1 refills | Status: DC

## 2017-09-26 MED ORDER — METFORMIN HCL 1000 MG PO TABS: 1000.0000 mg | ORAL_TABLET | Freq: Two times a day (BID) | ORAL | 1 refills | Status: DC

## 2017-09-26 MED ORDER — GLUCOSE BLOOD VI STRP: ORAL_STRIP | 12 refills | Status: DC

## 2017-09-26 MED ORDER — FREESTYLE SYSTEM KIT: 1.0000 | PACK | 0 refills | Status: AC | PRN

## 2017-09-26 MED ORDER — SEMAGLUTIDE (1 MG/DOSE) 2 MG/1.5ML ~~LOC~~ SOPN: 1.0000 mg | PEN_INJECTOR | SUBCUTANEOUS | 6 refills | Status: DC

## 2017-09-26 NOTE — Progress Notes (Signed)
BP (!) 145/90 (BP Location: Left Arm, Cuff Size: Normal)   Pulse 85   Temp 98.5 F (36.9 C) (Oral)   Wt 240 lb 4 oz (109 kg)   LMP  (LMP Unknown)   SpO2 96%   BMI 46.92 kg/m    Subjective:    Patient ID: Tricia Hill, female    DOB: 1971/06/04, 47 y.o.   MRN: [id number]  HPI: Tricia Hill is a 47 y.o. female  Chief Complaint  Patient presents with  . Diabetes  . Hyperlipidemia   DIABETES Hypoglycemic episodes:no Polydipsia/polyuria: no Visual disturbance: no Chest pain: no Paresthesias: no Glucose Monitoring: no Taking Insulin?: no Blood Pressure Monitoring: not checking Retinal Examination: Not up to Date Foot Exam: Up to Date Diabetic Education: Completed Pneumovax: Up to Date Influenza: Up to Date Aspirin: no  HYPERLIPIDEMIA Hyperlipidemia status: excellent compliance Satisfied with current treatment?  yes Side effects:  no Medication compliance: excellent compliance Past cholesterol meds: atorvastain (lipitor) Supplements: none Aspirin:  no The 10-year ASCVD risk score Mikey Bussing DC Jr., et al., 2013) is: 2.8%   Values used to calculate the score:     Age: 52 years     Sex: Female     Is Non-Hispanic African American: No     Diabetic: Yes     Tobacco smoker: No     Systolic Blood Pressure: 081 mmHg     Is BP treated: No     HDL Cholesterol: 43 mg/dL     Total Cholesterol: 196 mg/dL Chest pain:  no Coronary artery disease:  no   Relevant past medical, surgical, family and social history reviewed and updated as indicated. Interim medical history since our last visit reviewed. Allergies and medications reviewed and updated.  Review of Systems  Constitutional: Negative.   Respiratory: Negative.   Cardiovascular: Negative.   Musculoskeletal: Positive for myalgias, neck pain and neck stiffness. Negative for arthralgias, back pain, gait problem and joint swelling.  Neurological: Negative.   Psychiatric/Behavioral: Negative.     Per HPI  unless specifically indicated above     Objective:    BP (!) 145/90 (BP Location: Left Arm, Cuff Size: Normal)   Pulse 85   Temp 98.5 F (36.9 C) (Oral)   Wt 240 lb 4 oz (109 kg)   LMP  (LMP Unknown)   SpO2 96%   BMI 46.92 kg/m   Wt Readings from Last 3 Encounters:  09/26/17 240 lb 4 oz (109 kg)  06/17/17 242 lb 1 oz (109.8 kg)  02/28/17 243 lb 12.8 oz (110.6 kg)    Physical Exam  Constitutional: She is oriented to person, place, and time. She appears well-developed and well-nourished. No distress.  HENT:  Head: Normocephalic and atraumatic.  Right Ear: Hearing normal.  Left Ear: Hearing normal.  Nose: Nose normal.  Eyes: Conjunctivae and lids are normal. Right eye exhibits no discharge. Left eye exhibits no discharge. No scleral icterus.  Cardiovascular: Normal rate, regular rhythm, normal heart sounds and intact distal pulses. Exam reveals no gallop and no friction rub.  No murmur heard. Pulmonary/Chest: Effort normal and breath sounds normal. No stridor. No respiratory distress. She has no wheezes. She has no rales. She exhibits no tenderness.  Musculoskeletal: Normal range of motion.  Neurological: She is alert and oriented to person, place, and time.  Skin: Skin is warm, dry and intact. Capillary refill takes less than 2 seconds. No rash noted. She is not diaphoretic. No erythema. No pallor.  Psychiatric: She  has a normal mood and affect. Her speech is normal and behavior is normal. Judgment and thought content normal. Cognition and memory are normal.  Nursing note and vitals reviewed.   Results for orders placed or performed in visit on 06/17/17  Bayer DCA Hb A1c Waived  Result Value Ref Range   Bayer DCA Hb A1c Waived 8.1 (H) <7.0 %      Assessment & Plan:   Problem List Items Addressed This Visit      Endocrine   Uncontrolled diabetes mellitus ([place]) - Primary    Improved with A1c of 7.8- continue to work on diet and exercise and recheck in 3 months.        Relevant Medications   Semaglutide (OZEMPIC) 1 MG/DOSE SOPN   metFORMIN (GLUCOPHAGE) 1000 MG tablet   atorvastatin (LIPITOR) 40 MG tablet   Other Relevant Orders   Bayer DCA Hb A1c Waived   Hyperlipidemia associated with type 2 diabetes mellitus ([facility])    Rechecking levels today. Await results. Call with any concerns. Continue atorvastatin.       Relevant Medications   Semaglutide (OZEMPIC) 1 MG/DOSE SOPN   metFORMIN (GLUCOPHAGE) 1000 MG tablet   atorvastatin (LIPITOR) 40 MG tablet   Other Relevant Orders   Comprehensive metabolic panel   Lipid Panel w/o Chol/HDL Ratio    Other Visit Diagnoses    Elevated blood pressure reading       Will work on [employer] and recheck 3 months.        Follow up plan: Return in about 3 months (around 12/26/2017) for diabetes follow up.

## 2017-09-26 NOTE — Patient Instructions (Signed)
DASH Eating Plan DASH stands for "Dietary Approaches to Stop Hypertension." The DASH eating plan is a healthy eating plan that has been shown to reduce high blood pressure (hypertension). It may also reduce your risk for type 2 diabetes, heart disease, and stroke. The DASH eating plan may also help with weight loss. What are tips for following this plan? General guidelines  Avoid eating more than 2,300 mg (milligrams) of salt (sodium) a day. If you have hypertension, you may need to reduce your sodium intake to 1,500 mg a day.  Limit alcohol intake to no more than 1 drink a day for nonpregnant women and 2 drinks a day for men. One drink equals 12 oz of beer, 5 oz of wine, or 1 oz of hard liquor.  Work with your health care provider to maintain a healthy body weight or to lose weight. Ask what an ideal weight is for you.  Get at least 30 minutes of exercise that causes your heart to beat faster (aerobic exercise) most days of the week. Activities may include walking, swimming, or biking.  Work with your health care provider or diet and nutrition specialist (dietitian) to adjust your eating plan to your individual calorie needs. Reading food labels  Check food labels for the amount of sodium per serving. Choose foods with less than 5 percent of the Daily Value of sodium. Generally, foods with less than 300 mg of sodium per serving fit into this eating plan.  To find whole grains, look for the word "whole" as the first word in the ingredient list. Shopping  Buy products labeled as "low-sodium" or "no salt added."  Buy fresh foods. Avoid canned foods and premade or frozen meals. Cooking  Avoid adding salt when cooking. Use salt-free seasonings or herbs instead of table salt or sea salt. Check with your health care provider or pharmacist before using salt substitutes.  Do not fry foods. Cook foods using healthy methods such as baking, boiling, grilling, and broiling instead.  Cook with  heart-healthy oils, such as olive, canola, soybean, or sunflower oil. Meal planning   Eat a balanced diet that includes: ? 5 or more servings of fruits and vegetables each day. At each meal, try to fill half of your plate with fruits and vegetables. ? Up to 6-8 servings of whole grains each day. ? Less than 6 oz of lean meat, poultry, or fish each day. A 3-oz serving of meat is about the same size as a deck of cards. One egg equals 1 oz. ? 2 servings of low-fat dairy each day. ? A serving of nuts, seeds, or beans 5 times each week. ? Heart-healthy fats. Healthy fats called Omega-3 fatty acids are found in foods such as flaxseeds and coldwater fish, like sardines, salmon, and mackerel.  Limit how much you eat of the following: ? Canned or prepackaged foods. ? Food that is high in trans fat, such as fried foods. ? Food that is high in saturated fat, such as fatty meat. ? Sweets, desserts, sugary drinks, and other foods with added sugar. ? Full-fat dairy products.  Do not salt foods before eating.  Try to eat at least 2 vegetarian meals each week.  Eat more home-cooked food and less restaurant, buffet, and fast food.  When eating at a restaurant, ask that your food be prepared with less salt or no salt, if possible. What foods are recommended? The items listed may not be a complete list. Talk with your dietitian about what   dietary choices are best for you. Grains Whole-grain or whole-wheat bread. Whole-grain or whole-wheat pasta. Brown rice. Oatmeal. Quinoa. Bulgur. Whole-grain and low-sodium cereals. Pita bread. Low-fat, low-sodium crackers. Whole-wheat flour tortillas. Vegetables Fresh or frozen vegetables (raw, steamed, roasted, or grilled). Low-sodium or reduced-sodium tomato and vegetable juice. Low-sodium or reduced-sodium tomato sauce and tomato paste. Low-sodium or reduced-sodium canned vegetables. Fruits All fresh, dried, or frozen fruit. Canned fruit in natural juice (without  added sugar). Meat and other protein foods Skinless chicken or turkey. Ground chicken or turkey. Pork with fat trimmed off. Fish and seafood. Egg whites. Dried beans, peas, or lentils. Unsalted nuts, nut butters, and seeds. Unsalted canned beans. Lean cuts of beef with fat trimmed off. Low-sodium, lean deli meat. Dairy Low-fat (1%) or fat-free (skim) milk. Fat-free, low-fat, or reduced-fat cheeses. Nonfat, low-sodium ricotta or cottage cheese. Low-fat or nonfat yogurt. Low-fat, low-sodium cheese. Fats and oils Soft margarine without trans fats. Vegetable oil. Low-fat, reduced-fat, or light mayonnaise and salad dressings (reduced-sodium). Canola, safflower, olive, soybean, and sunflower oils. Avocado. Seasoning and other foods Herbs. Spices. Seasoning mixes without salt. Unsalted popcorn and pretzels. Fat-free sweets. What foods are not recommended? The items listed may not be a complete list. Talk with your dietitian about what dietary choices are best for you. Grains Baked goods made with fat, such as croissants, muffins, or some breads. Dry pasta or rice meal packs. Vegetables Creamed or fried vegetables. Vegetables in a cheese sauce. Regular canned vegetables (not low-sodium or reduced-sodium). Regular canned tomato sauce and paste (not low-sodium or reduced-sodium). Regular tomato and vegetable juice (not low-sodium or reduced-sodium). Pickles. Olives. Fruits Canned fruit in a light or heavy syrup. Fried fruit. Fruit in cream or butter sauce. Meat and other protein foods Fatty cuts of meat. Ribs. Fried meat. Bacon. Sausage. Bologna and other processed lunch meats. Salami. Fatback. Hotdogs. Bratwurst. Salted nuts and seeds. Canned beans with added salt. Canned or smoked fish. Whole eggs or egg yolks. Chicken or turkey with skin. Dairy Whole or 2% milk, cream, and half-and-half. Whole or full-fat cream cheese. Whole-fat or sweetened yogurt. Full-fat cheese. Nondairy creamers. Whipped toppings.  Processed cheese and cheese spreads. Fats and oils Butter. Stick margarine. Lard. Shortening. Ghee. Bacon fat. Tropical oils, such as coconut, palm kernel, or palm oil. Seasoning and other foods Salted popcorn and pretzels. Onion salt, garlic salt, seasoned salt, table salt, and sea salt. Worcestershire sauce. Tartar sauce. Barbecue sauce. Teriyaki sauce. Soy sauce, including reduced-sodium. Steak sauce. Canned and packaged gravies. Fish sauce. Oyster sauce. Cocktail sauce. Horseradish that you find on the shelf. Ketchup. Mustard. Meat flavorings and tenderizers. Bouillon cubes. Hot sauce and Tabasco sauce. Premade or packaged marinades. Premade or packaged taco seasonings. Relishes. Regular salad dressings. Where to find more information:  National Heart, Lung, and Blood Institute: www.nhlbi.nih.gov  American Heart Association: www.heart.org Summary  The DASH eating plan is a healthy eating plan that has been shown to reduce high blood pressure (hypertension). It may also reduce your risk for type 2 diabetes, heart disease, and stroke.  With the DASH eating plan, you should limit salt (sodium) intake to 2,300 mg a day. If you have hypertension, you may need to reduce your sodium intake to 1,500 mg a day.  When on the DASH eating plan, aim to eat more fresh fruits and vegetables, whole grains, lean proteins, low-fat dairy, and heart-healthy fats.  Work with your health care provider or diet and nutrition specialist (dietitian) to adjust your eating plan to your individual   calorie needs. This information is not intended to replace advice given to you by your health care provider. Make sure you discuss any questions you have with your health care provider. Document Released: 05/17/2011 Document Revised: 05/21/2016 Document Reviewed: 05/21/2016 Elsevier Interactive Patient Education  2018 Elsevier Inc.  

## 2017-09-26 NOTE — Assessment & Plan Note (Signed)
Improved with A1c of 7.8- continue to work on diet and exercise and recheck in 3 months.

## 2017-09-26 NOTE — Assessment & Plan Note (Signed)
Rechecking levels today. Await results. Call with any concerns. Continue atorvastatin.

## 2017-09-27 LAB — COMPREHENSIVE METABOLIC PANEL
A/G RATIO: 1.5 (ref 1.2–2.2)
ALT: 21 IU/L (ref 0–32)
AST: 14 IU/L (ref 0–40)
Albumin: 4.1 g/dL (ref 3.5–5.5)
Alkaline Phosphatase: 115 IU/L (ref 39–117)
BUN/Creatinine Ratio: 14 (ref 9–23)
BUN: 8 mg/dL (ref 6–24)
Bilirubin Total: 0.2 mg/dL (ref 0.0–1.2)
CALCIUM: 9.4 mg/dL (ref 8.7–10.2)
CO2: 27 mmol/L (ref 20–29)
CREATININE: 0.56 mg/dL — AB (ref 0.57–1.00)
Chloride: 99 mmol/L (ref 96–106)
GFR, EST AFRICAN AMERICAN: 130 mL/min/{1.73_m2} (ref >59)
GFR, EST NON AFRICAN AMERICAN: 113 mL/min/{1.73_m2} (ref >59)
Globulin, Total: 2.7 g/dL (ref 1.5–4.5)
Glucose: 155 mg/dL — ABNORMAL HIGH (ref 65–99)
POTASSIUM: 3.6 mmol/L (ref 3.5–5.2)
Sodium: 142 mmol/L (ref 134–144)
TOTAL PROTEIN: 6.8 g/dL (ref 6.0–8.5)

## 2017-09-27 LAB — LIPID PANEL W/O CHOL/HDL RATIO
Cholesterol, Total: 179 mg/dL (ref 100–199)
HDL: 37 mg/dL — ABNORMAL LOW (ref >39)
LDL CALC: 82 mg/dL (ref 0–99)
TRIGLYCERIDES: 301 mg/dL — AB (ref 0–149)
VLDL CHOLESTEROL CAL: 60 mg/dL — AB (ref 5–40)

## 2017-10-10 ENCOUNTER — Ambulatory Visit (INDEPENDENT_AMBULATORY_CARE_PROVIDER_SITE_OTHER): Payer: 59 | Admitting: Family Medicine

## 2017-10-10 ENCOUNTER — Encounter: Payer: Self-pay | Admitting: Family Medicine

## 2017-10-10 VITALS — BP 136/89 | HR 89 | Temp 98.6°F | Wt 241.4 lb

## 2017-10-10 DIAGNOSIS — R0981 Nasal congestion: Secondary | ICD-10-CM | POA: Diagnosis not present

## 2017-10-10 MED ORDER — PREDNISONE 50 MG PO TABS: 50.0000 mg | ORAL_TABLET | Freq: Every day | ORAL | 0 refills | Status: DC

## 2017-10-10 NOTE — Progress Notes (Signed)
BP 136/89 (BP Location: Right Arm, Patient Position: Sitting, Cuff Size: Large)   Pulse 89   Temp 98.6 F (37 C)   Wt 241 lb 6 oz (109.5 kg)   LMP  (LMP Unknown)   SpO2 97%   BMI 47.14 kg/m    Subjective:    Patient ID: Tricia Hill, female    DOB: September 19, 1970, 47 y.o.   MRN: [id number]  HPI: Tricia Hill is a 47 y.o. female  Chief Complaint  Patient presents with  . URI    X 1 week, now her ears are clogged and they hurt   UPPER RESPIRATORY TRACT INFECTION Duration: 1+ week Worst symptom: clogged ears Fever: no Cough: yes Shortness of breath: yes Wheezing: no Chest pain: yes, with cough Chest tightness: no Chest congestion: no Nasal congestion: yes Runny nose: yes Post nasal drip: yes Sneezing: no Sore throat: no Swollen glands: no Sinus pressure: yes Headache: yes Face pain: no Toothache: no Ear pain: yes "right Ear pressure: yes bilateral Eyes red/itching:no Eye drainage/crusting: no  Vomiting: no Rash: no Fatigue: yes Sick contacts: yes Strep contacts: no  Context: worse Recurrent sinusitis: no Relief with OTC cold/cough medications: no  Treatments attempted: zyrtec    Relevant past medical, surgical, family and social history reviewed and updated as indicated. Interim medical history since our last visit reviewed. Allergies and medications reviewed and updated.  Review of Systems  Constitutional: Positive for fatigue. Negative for activity change, appetite change, chills, diaphoresis, fever and unexpected weight change.  HENT: Positive for congestion, ear pain, postnasal drip, rhinorrhea and sinus pressure. Negative for dental problem, drooling, ear discharge, facial swelling, hearing loss, mouth sores, nosebleeds, sinus pain, sneezing, sore throat, tinnitus, trouble swallowing and voice change.   Eyes: Negative.   Respiratory: Negative.   Cardiovascular: Negative.   Psychiatric/Behavioral: Negative.     Per HPI unless specifically  indicated above     Objective:    BP 136/89 (BP Location: Right Arm, Patient Position: Sitting, Cuff Size: Large)   Pulse 89   Temp 98.6 F (37 C)   Wt 241 lb 6 oz (109.5 kg)   LMP  (LMP Unknown)   SpO2 97%   BMI 47.14 kg/m   Wt Readings from Last 3 Encounters:  10/10/17 241 lb 6 oz (109.5 kg)  09/26/17 240 lb 4 oz (109 kg)  06/17/17 242 lb 1 oz (109.8 kg)    Physical Exam  Constitutional: She is oriented to person, place, and time. She appears well-developed and well-nourished. No distress.  HENT:  Head: Normocephalic and atraumatic.  Right Ear: Hearing and external ear normal.  Left Ear: Hearing and external ear normal.  Nose: Nose normal.  Mouth/Throat: Oropharynx is clear and moist. No oropharyngeal exudate.  Eyes: Pupils are equal, round, and reactive to light. Conjunctivae, EOM and lids are normal. Right eye exhibits no discharge. Left eye exhibits no discharge. No scleral icterus.  Neck: Normal range of motion. Neck supple. No JVD present. No tracheal deviation present. No thyromegaly present.  Cardiovascular: Normal rate, regular rhythm, normal heart sounds and intact distal pulses. Exam reveals no gallop and no friction rub.  No murmur heard. Pulmonary/Chest: Effort normal and breath sounds normal. No stridor. No respiratory distress. She has no wheezes. She has no rales. She exhibits no tenderness.  Musculoskeletal: Normal range of motion.  Lymphadenopathy:    She has no cervical adenopathy.  Neurological: She is alert and oriented to person, place, and time.  Skin: Skin  is warm, dry and intact. Capillary refill takes less than 2 seconds. No rash noted. She is not diaphoretic. No erythema. No pallor.  Psychiatric: She has a normal mood and affect. Her speech is normal and behavior is normal. Judgment and thought content normal. Cognition and memory are normal.  Nursing note and vitals reviewed.   Results for orders placed or performed in visit on 09/26/17  Bayer  DCA Hb A1c Waived  Result Value Ref Range   Bayer DCA Hb A1c Waived 7.8 (H) <7.0 %  Comprehensive metabolic panel  Result Value Ref Range   Glucose 155 (H) 65 - 99 mg/dL   BUN 8 6 - 24 mg/dL   Creatinine, Ser 0.56 (L) 0.57 - 1.00 mg/dL   GFR calc non Af Amer 113 >59 mL/min/1.73   GFR calc Af Amer 130 >59 mL/min/1.73   BUN/Creatinine Ratio 14 9 - 23   Sodium 142 134 - 144 mmol/L   Potassium 3.6 3.5 - 5.2 mmol/L   Chloride 99 96 - 106 mmol/L   CO2 27 20 - 29 mmol/L   Calcium 9.4 8.7 - 10.2 mg/dL   Total Protein 6.8 6.0 - 8.5 g/dL   Albumin 4.1 3.5 - 5.5 g/dL   Globulin, Total 2.7 1.5 - 4.5 g/dL   Albumin/Globulin Ratio 1.5 1.2 - 2.2   Bilirubin Total 0.2 0.0 - 1.2 mg/dL   Alkaline Phosphatase 115 39 - 117 IU/L   AST 14 0 - 40 IU/L   ALT 21 0 - 32 IU/L  Lipid Panel w/o Chol/HDL Ratio  Result Value Ref Range   Cholesterol, Total 179 100 - 199 mg/dL   Triglycerides 301 (H) 0 - 149 mg/dL   HDL 37 (L) >39 mg/dL   VLDL Cholesterol Cal 60 (H) 5 - 40 mg/dL   LDL Calculated 82 0 - 99 mg/dL      Assessment & Plan:   Problem List Items Addressed This Visit    None    Visit Diagnoses    Nasal congestion    -  Primary   No sign of bacterial infection. Will treat with prednisone. Call if not getting better or getting worse.        Follow up plan: Return if symptoms worsen or fail to improve.

## 2017-12-26 ENCOUNTER — Ambulatory Visit: Payer: 59 | Admitting: Family Medicine

## 2018-01-02 LAB — HM DIABETES EYE EXAM

## 2018-01-09 ENCOUNTER — Encounter: Payer: Self-pay | Admitting: Family Medicine

## 2018-01-09 ENCOUNTER — Ambulatory Visit: Payer: 59 | Admitting: Family Medicine

## 2018-01-09 ENCOUNTER — Other Ambulatory Visit: Payer: Self-pay

## 2018-01-09 VITALS — BP 136/89 | HR 84 | Temp 98.4°F | Ht 60.0 in | Wt 241.1 lb

## 2018-01-09 DIAGNOSIS — E1165 Type 2 diabetes mellitus with hyperglycemia: Secondary | ICD-10-CM

## 2018-01-09 LAB — BAYER DCA HB A1C WAIVED: HB A1C: 7.8 % — AB (ref <7.0)

## 2018-01-09 NOTE — Assessment & Plan Note (Signed)
A1c stable at 7.8- will work harder on her diet and will recheck in 3 months. Call with any concerns.

## 2018-01-09 NOTE — Progress Notes (Signed)
BP 136/89   Pulse 84   Temp 98.4 F (36.9 C) (Oral)   Ht 5' (1.524 m)   Wt 241 lb 1.6 oz (109.4 kg)   LMP  (LMP Unknown)   SpO2 97%   BMI 47.09 kg/m    Subjective:    Patient ID: Tricia Hill, female    DOB: 09-02-1970, 47 y.o.   MRN: [id number]  HPI: Tricia Hill is a 47 y.o. female  Chief Complaint  Patient presents with  . Diabetes   DIABETES- has not been watching her diet as well.  Hypoglycemic episodes:no Polydipsia/polyuria: no Visual disturbance: no Chest pain: no Paresthesias: no Glucose Monitoring: yes  Accucheck frequency: Daily  Fasting glucose: 140s-150s Taking Insulin?: no Blood Pressure Monitoring: not checking Retinal Examination: Up to Date Foot Exam: Up to Date Diabetic Education: Completed Pneumovax: Up to Date Influenza: Up to Date Aspirin: no  Relevant past medical, surgical, family and social history reviewed and updated as indicated. Interim medical history since our last visit reviewed. Allergies and medications reviewed and updated.  Review of Systems  Constitutional: Negative.   Respiratory: Negative.   Cardiovascular: Negative.   Neurological: Negative.   Psychiatric/Behavioral: Negative.     Per HPI unless specifically indicated above     Objective:    BP 136/89   Pulse 84   Temp 98.4 F (36.9 C) (Oral)   Ht 5' (1.524 m)   Wt 241 lb 1.6 oz (109.4 kg)   LMP  (LMP Unknown)   SpO2 97%   BMI 47.09 kg/m   Wt Readings from Last 3 Encounters:  01/09/18 241 lb 1.6 oz (109.4 kg)  10/10/17 241 lb 6 oz (109.5 kg)  09/26/17 240 lb 4 oz (109 kg)    Physical Exam  Constitutional: She is oriented to person, place, and time. She appears well-developed and well-nourished. No distress.  HENT:  Head: Normocephalic and atraumatic.  Right Ear: Hearing normal.  Left Ear: Hearing normal.  Nose: Nose normal.  Eyes: Conjunctivae and lids are normal. Right eye exhibits no discharge. Left eye exhibits no discharge. No scleral  icterus.  Cardiovascular: Normal rate, regular rhythm, normal heart sounds and intact distal pulses. Exam reveals no gallop and no friction rub.  No murmur heard. Pulmonary/Chest: Effort normal and breath sounds normal. No stridor. No respiratory distress. She has no wheezes. She has no rales. She exhibits no tenderness.  Musculoskeletal: Normal range of motion.  Neurological: She is alert and oriented to person, place, and time.  Skin: Skin is warm, dry and intact. Capillary refill takes less than 2 seconds. No rash noted. She is not diaphoretic. No erythema. No pallor.  Psychiatric: She has a normal mood and affect. Her speech is normal and behavior is normal. Judgment and thought content normal. Cognition and memory are normal.  Nursing note and vitals reviewed.   Results for orders placed or performed in visit on 01/03/18  HM DIABETES EYE EXAM  Result Value Ref Range   HM Diabetic Eye Exam No Retinopathy No Retinopathy      Assessment & Plan:   Problem List Items Addressed This Visit      Endocrine   Uncontrolled diabetes mellitus ([place]) - Primary    A1c stable at 7.8- will work harder on her diet and will recheck in 3 months. Call with any concerns.      Relevant Orders   Bayer DCA Hb A1c Waived       Follow up plan: Return in  about 3 months (around 04/11/2018) for Physical.

## 2018-02-20 DIAGNOSIS — D485 Neoplasm of uncertain behavior of skin: Secondary | ICD-10-CM | POA: Diagnosis not present

## 2018-02-20 DIAGNOSIS — D2261 Melanocytic nevi of right upper limb, including shoulder: Secondary | ICD-10-CM | POA: Diagnosis not present

## 2018-02-20 DIAGNOSIS — D3613 Benign neoplasm of peripheral nerves and autonomic nervous system of lower limb, including hip: Secondary | ICD-10-CM | POA: Diagnosis not present

## 2018-02-20 DIAGNOSIS — D225 Melanocytic nevi of trunk: Secondary | ICD-10-CM | POA: Diagnosis not present

## 2018-02-20 DIAGNOSIS — L918 Other hypertrophic disorders of the skin: Secondary | ICD-10-CM | POA: Diagnosis not present

## 2018-02-20 DIAGNOSIS — D2262 Melanocytic nevi of left upper limb, including shoulder: Secondary | ICD-10-CM | POA: Diagnosis not present

## 2018-02-20 DIAGNOSIS — R208 Other disturbances of skin sensation: Secondary | ICD-10-CM | POA: Diagnosis not present

## 2018-02-25 ENCOUNTER — Encounter: Payer: Self-pay | Admitting: Podiatry

## 2018-02-25 ENCOUNTER — Ambulatory Visit (INDEPENDENT_AMBULATORY_CARE_PROVIDER_SITE_OTHER): Payer: 59

## 2018-02-25 ENCOUNTER — Ambulatory Visit: Payer: 59 | Admitting: Podiatry

## 2018-02-25 VITALS — BP 166/89 | HR 82

## 2018-02-25 DIAGNOSIS — M722 Plantar fascial fibromatosis: Secondary | ICD-10-CM

## 2018-02-25 MED ORDER — METHYLPREDNISOLONE 4 MG PO TBPK
ORAL_TABLET | ORAL | 0 refills | Status: DC
Start: 2018-02-25 — End: 2018-04-16

## 2018-02-25 MED ORDER — MELOXICAM 15 MG PO TABS: 15.0000 mg | ORAL_TABLET | Freq: Every day | ORAL | 1 refills | Status: AC

## 2018-02-27 NOTE — Progress Notes (Signed)
   Subjective: 47 year old female presenting today as a new patient with a chief complaint of pain of the right heel that began about 1-2 months ago. She states she has been diagnosed with plantar fasciitis of the left foot in the past. Walking and standing for long periods of time increases the pain. She has not done anything for treatment. She denies any injury. Patient is here for further evaluation and treatment.   Past Medical History:  Diagnosis Date  . Asthma   . Diabetes mellitus without complication ([place]) 1761  . GERD (gastroesophageal reflux disease)   . Heart murmur    child  . Hypertension   . Shingles   . Sleep apnea    USE CPAP     Objective: Physical Exam General: The patient is alert and oriented x3 in no acute distress.  Dermatology: Skin is warm, dry and supple bilateral lower extremities. Negative for open lesions or macerations bilateral.   Vascular: Dorsalis Pedis and Posterior Tibial pulses palpable bilateral.  Capillary fill time is immediate to all digits.  Neurological: Epicritic and protective threshold intact bilateral.   Musculoskeletal: Tenderness to palpation to the plantar aspect of the right heel along the plantar fascia. All other joints range of motion within normal limits bilateral. Strength 5/5 in all groups bilateral.   Radiographic exam: Normal osseous mineralization. Joint spaces preserved. No fracture/dislocation/boney destruction. No other soft tissue abnormalities or radiopaque foreign bodies.   Assessment: 1. Plantar fasciitis right 2. Pain in right foot  Plan of Care:  1. Patient evaluated. Xrays reviewed.   2. Injection of 0.5cc Celestone soluspan injected into the right plantar fascia  3. Rx for Medrol Dose pack placed 4. Rx for Meloxicam ordered for patient. 4. Plantar fascial band(s) dispensed 5. Instructed patient regarding therapies and modalities at home to alleviate symptoms.  6. Return to clinic in 4 weeks.    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   Edrick Kins, DPM [facility]  Dr. Edrick Kins, DPM    [address], [place] [address]                Office [phone]  Fax [phone]

## 2018-03-03 ENCOUNTER — Other Ambulatory Visit: Payer: Self-pay | Admitting: Family Medicine

## 2018-03-25 ENCOUNTER — Encounter: Payer: Self-pay | Admitting: Podiatry

## 2018-03-25 ENCOUNTER — Ambulatory Visit: Payer: 59 | Admitting: Podiatry

## 2018-03-25 DIAGNOSIS — M722 Plantar fascial fibromatosis: Secondary | ICD-10-CM | POA: Diagnosis not present

## 2018-03-28 NOTE — Progress Notes (Signed)
   Subjective: 47 year old female presenting today for follow up evaluation of plantar fasciitis of the right foot. She reports her condition has improved greatly. She denies any significant pain or modifying factors. She has been taking Meloxicam and using the plantar fascial brace which has helped alleviate the pain. Patient is here for further evaluation and treatment.   Past Medical History:  Diagnosis Date  . Asthma   . Diabetes mellitus without complication ([place]) 6073  . GERD (gastroesophageal reflux disease)   . Heart murmur    child  . Hypertension   . Shingles   . Sleep apnea    USE CPAP    Objective: Physical Exam General: The patient is alert and oriented x3 in no acute distress.  Dermatology: Skin is cool, dry and supple bilateral lower extremities. Negative for open lesions or macerations.  Vascular: Palpable pedal pulses bilaterally. No edema or erythema noted. Capillary refill within normal limits.  Neurological: Epicritic and protective threshold grossly intact bilaterally.   Musculoskeletal Exam: All pedal and ankle joints range of motion within normal limits bilateral. Muscle strength 5/5 in all groups bilateral.   Assessment: 1. Plantar fasciitis right - resolved    Plan of Care:  1. Patient evaluated.  2. Recommended good shoe gear.  3. Continue taking Meloxicam as needed.  4. Continue using plantar fascial brace as needed.  5. Return to clinic as needed.   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   Edrick Kins, DPM [facility]  Dr. Edrick Kins, DPM    [address], [place] [address]                Office [phone]  Fax [phone]

## 2018-04-16 ENCOUNTER — Encounter: Payer: Self-pay | Admitting: Family Medicine

## 2018-04-16 ENCOUNTER — Ambulatory Visit (INDEPENDENT_AMBULATORY_CARE_PROVIDER_SITE_OTHER): Payer: 59 | Admitting: Family Medicine

## 2018-04-16 VITALS — BP 140/88 | HR 72 | Temp 98.6°F | Ht 60.3 in | Wt 243.4 lb

## 2018-04-16 DIAGNOSIS — E785 Hyperlipidemia, unspecified: Secondary | ICD-10-CM

## 2018-04-16 DIAGNOSIS — K760 Fatty (change of) liver, not elsewhere classified: Secondary | ICD-10-CM

## 2018-04-16 DIAGNOSIS — I10 Essential (primary) hypertension: Secondary | ICD-10-CM

## 2018-04-16 DIAGNOSIS — Z0000 Encounter for general adult medical examination without abnormal findings: Secondary | ICD-10-CM

## 2018-04-16 DIAGNOSIS — E1165 Type 2 diabetes mellitus with hyperglycemia: Secondary | ICD-10-CM

## 2018-04-16 DIAGNOSIS — E1169 Type 2 diabetes mellitus with other specified complication: Secondary | ICD-10-CM | POA: Diagnosis not present

## 2018-04-16 LAB — UA/M W/RFLX CULTURE, ROUTINE
Bilirubin, UA: NEGATIVE
Glucose, UA: NEGATIVE
Ketones, UA: NEGATIVE
LEUKOCYTES UA: NEGATIVE
NITRITE UA: NEGATIVE
PH UA: 6 (ref 5.0–7.5)
Protein, UA: NEGATIVE
RBC UA: NEGATIVE
Specific Gravity, UA: 1.02 (ref 1.005–1.030)
Urobilinogen, Ur: 0.2 mg/dL (ref 0.2–1.0)

## 2018-04-16 LAB — BAYER DCA HB A1C WAIVED: HB A1C (BAYER DCA - WAIVED): 8.5 % — ABNORMAL HIGH (ref <7.0)

## 2018-04-16 LAB — MICROALBUMIN, URINE WAIVED
Creatinine, Urine Waived: 200 mg/dL (ref 10–300)
Microalb, Ur Waived: 30 mg/L — ABNORMAL HIGH (ref 0–19)
Microalb/Creat Ratio: <30 mg/g (ref <30)

## 2018-04-16 MED ORDER — ATORVASTATIN CALCIUM 40 MG PO TABS: 40.0000 mg | ORAL_TABLET | Freq: Every day | ORAL | 1 refills | Status: DC

## 2018-04-16 MED ORDER — LISINOPRIL 10 MG PO TABS: 10.0000 mg | ORAL_TABLET | Freq: Every day | ORAL | 1 refills | Status: DC

## 2018-04-16 MED ORDER — SEMAGLUTIDE (1 MG/DOSE) 2 MG/1.5ML ~~LOC~~ SOPN: 1.0000 mg | PEN_INJECTOR | SUBCUTANEOUS | 6 refills | Status: DC

## 2018-04-16 NOTE — Assessment & Plan Note (Signed)
Will start lisinopril 10mg  and recheck 1 month. Call with any concerns.

## 2018-04-16 NOTE — Progress Notes (Signed)
BP 140/88 (BP Location: Left Arm, Patient Position: Sitting, Cuff Size: Large)   Pulse 72   Temp 98.6 F (37 C)   Ht 5' 0.3" (1.532 m)   Wt 243 lb 6 oz (110.4 kg)   LMP  (LMP Unknown)   SpO2 95%   BMI 47.06 kg/m    Subjective:    Patient ID: Tricia Hill, female    DOB: 12-15-1970, 47 y.o.   MRN: [id number]  HPI: Tricia Hill is a 47 y.o. female presenting on 04/16/2018 for comprehensive medical examination. Current medical complaints include:  Plantar fasciitis on the R is acting up  DIABETES Hypoglycemic episodes:no Polydipsia/polyuria: no Visual disturbance: no Chest pain: no Paresthesias: no Glucose Monitoring: yes  Accucheck frequency: Daily Taking Insulin?: no Blood Pressure Monitoring: not checking Retinal Examination: Up to Date Foot Exam: Done today Diabetic Education: Completed Pneumovax: Up to Date Influenza: Up to Date Aspirin: no  HYPERLIPIDEMIA Hyperlipidemia status: excellent compliance Satisfied with current treatment?  yes Side effects:  no Medication compliance: excellent compliance Past cholesterol meds: atorvastatin Supplements: none Aspirin:  no The 10-year ASCVD risk score Mikey Bussing DC Jr., et al., 2013) is: 3.9%   Values used to calculate the score:     Age: 74 years     Sex: Female     Is Non-Hispanic African American: No     Diabetic: Yes     Tobacco smoker: No     Systolic Blood Pressure: 664 mmHg     Is BP treated: Yes     HDL Cholesterol: 37 mg/dL     Total Cholesterol: 179 mg/dL Chest pain:  no  Menopausal Symptoms: no  Depression Screen done today and results listed below:  Depression screen [facility] 2/9 04/16/2018 02/28/2017 11/08/2015 02/08/2015  Decreased Interest 0 0 0 0  Down, Depressed, Hopeless 0 0 0 0  PHQ - 2 Score 0 0 0 0  Altered sleeping 0 - - -  Tired, decreased energy 3 - - -  Change in appetite 0 - - -  Feeling bad or failure about yourself  0 - - -  Trouble concentrating 0 - - -  Moving slowly or  fidgety/restless 0 - - -  Suicidal thoughts 0 - - -  PHQ-9 Score 3 - - -  Difficult doing work/chores Not difficult at all - - -    Past Medical History:  Past Medical History:  Diagnosis Date  . Asthma   . Diabetes mellitus without complication ([place]) 4034  . GERD (gastroesophageal reflux disease)   . Heart murmur    child  . Hypertension   . Shingles   . Sleep apnea    USE CPAP    Surgical History:  Past Surgical History:  Procedure Laterality Date  . ABDOMINAL HYSTERECTOMY     Partial  . CESAREAN SECTION     X 2  . CHOLECYSTECTOMY N/A 01/18/2017   Procedure: LAPAROSCOPIC CHOLECYSTECTOMY WITH INTRAOPERATIVE CHOLANGIOGRAM;  Surgeon: Robert Bellow, MD;  Location: [facility] ORS;  Service: General;  Laterality: N/A;  . DIAGNOSTIC LAPAROSCOPY  2006   EXCISION OF ABDOMINAL WALL ENDOMETRIOMA  . SPINAL FUSION  2012   C3 and C4    Medications:  Current Outpatient Medications on File Prior to Visit  Medication Sig  . glucose blood (FREESTYLE LITE) test strip USE AS DIRECTED  . glucose monitoring kit (FREESTYLE) monitoring kit 1 each by Does not apply route as needed for other.  . Lancets (FREESTYLE) lancets 1 each  by Other route See admin instructions. Use as instructed  . metFORMIN (GLUCOPHAGE) 1000 MG tablet TAKE 1 TABLET BY MOUTH 2 TIMES DAILY.  Marland Kitchen omeprazole (PRILOSEC) 20 MG capsule Take 1 capsule (20 mg total) by mouth daily.   No current facility-administered medications on file prior to visit.     Allergies:  Allergies  Allergen Reactions  . Ultram [Tramadol Hcl] Nausea And Vomiting    Severe vomiting   . Victoza [Liraglutide] Nausea And Vomiting    Social History:  Social History   Socioeconomic History  . Marital status: Married    Spouse name: Not on file  . Number of children: Not on file  . Years of education: Not on file  . Highest education level: Not on file  Occupational History  . Not on file  Social Needs  . Financial resource strain: Not on  file  . Food insecurity:    Worry: Not on file    Inability: Not on file  . Transportation needs:    Medical: Not on file    Non-medical: Not on file  Tobacco Use  . Smoking status: Never Smoker  . Smokeless tobacco: Never Used  Substance and Sexual Activity  . Alcohol use: Yes    Comment: on occasion  . Drug use: No  . Sexual activity: Never    Birth control/protection: None, Surgical  Lifestyle  . Physical activity:    Days per week: Not on file    Minutes per session: Not on file  . Stress: Not on file  Relationships  . Social connections:    Talks on phone: Not on file    Gets together: Not on file    Attends religious service: Not on file    Active member of club or organization: Not on file    Attends meetings of clubs or organizations: Not on file    Relationship status: Not on file  . Intimate partner violence:    Fear of current or ex partner: Not on file    Emotionally abused: Not on file    Physically abused: Not on file    Forced sexual activity: Not on file  Other Topics Concern  . Not on file  Social History Narrative  . Not on file   Social History   Tobacco Use  Smoking Status Never Smoker  Smokeless Tobacco Never Used   Social History   Substance and Sexual Activity  Alcohol Use Yes   Comment: on occasion    Family History:  Family History  Problem Relation Age of Onset  . Hypertension Mother   . Diabetes Mother   . Hypertension Father   . Graves' disease Sister   . Clotting disorder Brother   . Heart disease Maternal Grandmother   . Heart disease Maternal Grandfather   . Kidney disease Neg Hx   . Breast cancer Neg Hx     Past medical history, surgical history, medications, allergies, family history and social history reviewed with patient today and changes made to appropriate areas of the chart.   Review of Systems  Constitutional: Negative.   HENT: Negative.   Eyes: Negative.   Respiratory: Negative.   Cardiovascular:  Positive for leg swelling. Negative for chest pain, palpitations, orthopnea, claudication and PND.  Gastrointestinal: Negative.   Genitourinary: Negative.   Musculoskeletal: Negative.   Skin: Negative.   Neurological: Negative.   Endo/Heme/Allergies: Negative.   Psychiatric/Behavioral: Negative.     All other ROS negative except what is listed above  and in the HPI.      Objective:    BP 140/88 (BP Location: Left Arm, Patient Position: Sitting, Cuff Size: Large)   Pulse 72   Temp 98.6 F (37 C)   Ht 5' 0.3" (1.532 m)   Wt 243 lb 6 oz (110.4 kg)   LMP  (LMP Unknown)   SpO2 95%   BMI 47.06 kg/m   Wt Readings from Last 3 Encounters:  04/16/18 243 lb 6 oz (110.4 kg)  01/09/18 241 lb 1.6 oz (109.4 kg)  10/10/17 241 lb 6 oz (109.5 kg)    Physical Exam  Constitutional: She is oriented to person, place, and time. She appears well-developed and well-nourished. No distress.  HENT:  Head: Normocephalic and atraumatic.  Right Ear: Hearing and external ear normal.  Left Ear: Hearing and external ear normal.  Nose: Nose normal.  Mouth/Throat: Oropharynx is clear and moist. No oropharyngeal exudate.  Eyes: Pupils are equal, round, and reactive to light. Conjunctivae, EOM and lids are normal. Right eye exhibits no discharge. Left eye exhibits no discharge. No scleral icterus.  Neck: Normal range of motion. Neck supple. No JVD present. No tracheal deviation present. No thyromegaly present.  Cardiovascular: Normal rate, regular rhythm, normal heart sounds and intact distal pulses. Exam reveals no gallop and no friction rub.  No murmur heard. Pulmonary/Chest: Effort normal and breath sounds normal. No stridor. No respiratory distress. She has no wheezes. She has no rales. She exhibits no tenderness. Right breast exhibits no inverted nipple, no mass, no nipple discharge, no skin change and no tenderness. Left breast exhibits no inverted nipple, no mass, no nipple discharge, no skin change and no  tenderness. No breast swelling, tenderness, discharge or bleeding. Breasts are symmetrical.  Abdominal: Soft. Bowel sounds are normal. She exhibits no distension and no mass. There is no tenderness. There is no rebound and no guarding. No hernia.  Genitourinary: No breast swelling, tenderness, discharge or bleeding.  Genitourinary Comments: Pelvic exam deferred with shared decision making  Musculoskeletal: Normal range of motion. She exhibits no edema, tenderness or deformity.  Lymphadenopathy:    She has no cervical adenopathy.  Neurological: She is alert and oriented to person, place, and time. She displays normal reflexes. No cranial nerve deficit or sensory deficit. She exhibits normal muscle tone. Coordination normal.  Skin: Skin is warm, dry and intact. Capillary refill takes less than 2 seconds. No rash noted. She is not diaphoretic. No erythema. No pallor.  Psychiatric: She has a normal mood and affect. Her speech is normal and behavior is normal. Judgment and thought content normal. Cognition and memory are normal.  Nursing note and vitals reviewed.  Breast exam done today with Tiffany Reel, CMA in attendance.  Results for orders placed or performed in visit on 01/09/18  Bayer DCA Hb A1c Waived  Result Value Ref Range   HB A1C (BAYER DCA - WAIVED) 7.8 (H) <7.0 %      Assessment & Plan:   Problem List Items Addressed This Visit      Cardiovascular and Mediastinum   HTN (hypertension)    Will start lisinopril 64m and recheck 1 month. Call with any concerns.       Relevant Medications   lisinopril (PRINIVIL,ZESTRIL) 10 MG tablet   atorvastatin (LIPITOR) 40 MG tablet     Digestive   Fatty liver    Rechecking levels today. Await results. Call with any concerns.       Relevant Orders   CBC with  Differential/Platelet   Comprehensive metabolic panel   TSH   UA/M w/rflx Culture, Routine     Endocrine   Uncontrolled diabetes mellitus ([place])    Not under good control with  A1c of 8.5- just had steroid shot and steroid taper. Will recheck in 3 months if still >8 will start jardiance.      Relevant Medications   lisinopril (PRINIVIL,ZESTRIL) 10 MG tablet   atorvastatin (LIPITOR) 40 MG tablet   Semaglutide, 1 MG/DOSE, (OZEMPIC, 1 MG/DOSE,) 2 MG/1.5ML SOPN   Other Relevant Orders   Bayer DCA Hb A1c Waived   CBC with Differential/Platelet   Comprehensive metabolic panel   Microalbumin, Urine Waived   TSH   UA/M w/rflx Culture, Routine   Hyperlipidemia associated with type 2 diabetes mellitus ([facility])    Continue atorvastatin. Continue to monitor. Labs checked today. Await results.       Relevant Medications   lisinopril (PRINIVIL,ZESTRIL) 10 MG tablet   atorvastatin (LIPITOR) 40 MG tablet   Semaglutide, 1 MG/DOSE, (OZEMPIC, 1 MG/DOSE,) 2 MG/1.5ML SOPN   Other Relevant Orders   CBC with Differential/Platelet   Comprehensive metabolic panel   Lipid Panel w/o Chol/HDL Ratio   TSH   UA/M w/rflx Culture, Routine     Other   Morbid obesity ([place])    Discussed weight loss with goal of 1-2 lb loss a week. Call with any concerns.       Relevant Medications   Semaglutide, 1 MG/DOSE, (OZEMPIC, 1 MG/DOSE,) 2 MG/1.5ML SOPN    Other Visit Diagnoses    Routine general medical examination at a health care facility    -  Primary   Vaccines up to date. Screening labs checked today. Mammogram up to date. Pap N/A. Continue diet and exercise. Call with any concerns.    Relevant Orders   Bayer DCA Hb A1c Waived   CBC with Differential/Platelet   Comprehensive metabolic panel   Lipid Panel w/o Chol/HDL Ratio   Microalbumin, Urine Waived   TSH   UA/M w/rflx Culture, Routine       Follow up plan: Return in about 4 weeks (around 05/14/2018) for follow up BP .   LABORATORY TESTING:  - Pap smear: not applicable  IMMUNIZATIONS:   - Tdap: Tetanus vaccination status reviewed: last tetanus booster within 10 years. - Influenza: Up to date - Pneumovax: Up to  date  SCREENING: -Mammogram: Up to date   PATIENT COUNSELING:   Advised to take 1 mg of folate supplement per day if capable of pregnancy.   Sexuality: Discussed sexually transmitted diseases, partner selection, use of condoms, avoidance of unintended pregnancy  and contraceptive alternatives.   Advised to avoid cigarette smoking.  I discussed with the patient that most people either abstain from alcohol or drink within safe limits (<=14/week and <=4 drinks/occasion for males, <=7/weeks and <= 3 drinks/occasion for females) and that the risk for alcohol disorders and other health effects rises proportionally with the number of drinks per week and how often a drinker exceeds daily limits.  Discussed cessation/primary prevention of drug use and availability of treatment for abuse.   Diet: Encouraged to adjust caloric intake to maintain  or achieve ideal body weight, to reduce intake of dietary saturated fat and total fat, to limit sodium intake by avoiding high sodium foods and not adding table salt, and to maintain adequate dietary potassium and calcium preferably from fresh fruits, vegetables, and low-fat dairy products.    stressed the importance of regular exercise  Injury  prevention: Discussed safety belts, safety helmets, smoke detector, smoking near bedding or upholstery.   Dental health: Discussed importance of regular tooth brushing, flossing, and dental visits.    NEXT PREVENTATIVE PHYSICAL DUE IN 1 YEAR. Return in about 4 weeks (around 05/14/2018) for follow up BP .

## 2018-04-16 NOTE — Assessment & Plan Note (Signed)
Not under good control with A1c of 8.5- just had steroid shot and steroid taper. Will recheck in 3 months if still >8 will start jardiance.

## 2018-04-16 NOTE — Assessment & Plan Note (Signed)
Discussed weight loss with goal of 1-2 lb loss a week. Call with any concerns.

## 2018-04-16 NOTE — Assessment & Plan Note (Signed)
Rechecking levels today. Await results. Call with any concerns.  

## 2018-04-16 NOTE — Assessment & Plan Note (Signed)
Continue atorvastatin. Continue to monitor. Labs checked today. Await results.

## 2018-04-17 LAB — COMPREHENSIVE METABOLIC PANEL
A/G RATIO: 1.8 (ref 1.2–2.2)
ALK PHOS: 114 IU/L (ref 39–117)
ALT: 19 IU/L (ref 0–32)
AST: 15 IU/L (ref 0–40)
Albumin: 4.1 g/dL (ref 3.5–5.5)
BILIRUBIN TOTAL: 0.4 mg/dL (ref 0.0–1.2)
BUN/Creatinine Ratio: 16 (ref 9–23)
BUN: 10 mg/dL (ref 6–24)
CHLORIDE: 98 mmol/L (ref 96–106)
CO2: 27 mmol/L (ref 20–29)
Calcium: 9.7 mg/dL (ref 8.7–10.2)
Creatinine, Ser: 0.61 mg/dL (ref 0.57–1.00)
GFR calc Af Amer: 127 mL/min/{1.73_m2} (ref >59)
GFR calc non Af Amer: 110 mL/min/{1.73_m2} (ref >59)
GLUCOSE: 164 mg/dL — AB (ref 65–99)
Globulin, Total: 2.3 g/dL (ref 1.5–4.5)
POTASSIUM: 3.7 mmol/L (ref 3.5–5.2)
Sodium: 139 mmol/L (ref 134–144)
Total Protein: 6.4 g/dL (ref 6.0–8.5)

## 2018-04-17 LAB — CBC WITH DIFFERENTIAL/PLATELET
BASOS: 0 %
Basophils Absolute: 0 10*3/uL (ref 0.0–0.2)
EOS (ABSOLUTE): 0.1 10*3/uL (ref 0.0–0.4)
Eos: 1 %
Hematocrit: 37.2 % (ref 34.0–46.6)
Hemoglobin: 12.5 g/dL (ref 11.1–15.9)
Immature Grans (Abs): 0 10*3/uL (ref 0.0–0.1)
Immature Granulocytes: 0 %
Lymphocytes Absolute: 2.4 10*3/uL (ref 0.7–3.1)
Lymphs: 31 %
MCH: 28.4 pg (ref 26.6–33.0)
MCHC: 33.6 g/dL (ref 31.5–35.7)
MCV: 85 fL (ref 79–97)
MONOS ABS: 0.4 10*3/uL (ref 0.1–0.9)
Monocytes: 5 %
NEUTROS ABS: 4.9 10*3/uL (ref 1.4–7.0)
NEUTROS PCT: 63 %
PLATELETS: 328 10*3/uL (ref 150–450)
RBC: 4.4 x10E6/uL (ref 3.77–5.28)
RDW: 13.2 % (ref 12.3–15.4)
WBC: 7.9 10*3/uL (ref 3.4–10.8)

## 2018-04-17 LAB — LIPID PANEL W/O CHOL/HDL RATIO
CHOLESTEROL TOTAL: 190 mg/dL (ref 100–199)
HDL: 44 mg/dL (ref >39)
LDL Calculated: 110 mg/dL — ABNORMAL HIGH (ref 0–99)
Triglycerides: 180 mg/dL — ABNORMAL HIGH (ref 0–149)
VLDL Cholesterol Cal: 36 mg/dL (ref 5–40)

## 2018-04-17 LAB — TSH: TSH: 2.51 u[IU]/mL (ref 0.450–4.500)

## 2018-05-15 ENCOUNTER — Encounter: Payer: Self-pay | Admitting: Family Medicine

## 2018-05-15 ENCOUNTER — Other Ambulatory Visit: Payer: Self-pay | Admitting: Family Medicine

## 2018-05-15 ENCOUNTER — Ambulatory Visit (INDEPENDENT_AMBULATORY_CARE_PROVIDER_SITE_OTHER): Payer: 59 | Admitting: Family Medicine

## 2018-05-15 VITALS — BP 132/85 | HR 81 | Temp 98.6°F | Wt 243.2 lb

## 2018-05-15 DIAGNOSIS — I10 Essential (primary) hypertension: Secondary | ICD-10-CM

## 2018-05-15 NOTE — Assessment & Plan Note (Signed)
Doing well on lisinopril. Continue current regimen. Checking BMP today. Follow up 2 months for recheck A1c

## 2018-05-15 NOTE — Progress Notes (Signed)
BP 132/85 (BP Location: Left Arm, Cuff Size: Normal)   Pulse 81   Temp 98.6 F (37 C)   Wt 243 lb 3.2 oz (110.3 kg)   LMP  (LMP Unknown)   SpO2 96%   BMI 47.03 kg/m    Subjective:    Patient ID: Tricia Hill, female    DOB: [birth year], 47 y.o.   MRN: [id number]  HPI: Tricia Hill is a 47 y.o. female  Chief Complaint  Patient presents with  . Hypertension   HYPERTENSION Hypertension status: controlled  Satisfied with current treatment? yes Duration of hypertension: chronic BP monitoring frequency:  a few times a week BP range: 100s-110s/70s-80s BP medication side effects:  yes- mild cough only at night, not bothering her Medication compliance: excellent compliance Previous BP meds: lisinopril Aspirin: no Recurrent headaches: no Visual changes: no Palpitations: no Dyspnea: no Chest pain: no Lower extremity edema: no Dizzy/lightheaded: no  Relevant past medical, surgical, family and social history reviewed and updated as indicated. Interim medical history since our last visit reviewed. Allergies and medications reviewed and updated.  Review of Systems  Constitutional: Negative.   Respiratory: Negative.   Cardiovascular: Negative.   Psychiatric/Behavioral: Negative.     Per HPI unless specifically indicated above     Objective:    BP 132/85 (BP Location: Left Arm, Cuff Size: Normal)   Pulse 81   Temp 98.6 F (37 C)   Wt 243 lb 3.2 oz (110.3 kg)   LMP  (LMP Unknown)   SpO2 96%   BMI 47.03 kg/m   Wt Readings from Last 3 Encounters:  05/15/18 243 lb 3.2 oz (110.3 kg)  04/16/18 243 lb 6 oz (110.4 kg)  01/09/18 241 lb 1.6 oz (109.4 kg)    Physical Exam  Constitutional: She is oriented to person, place, and time. She appears well-developed and well-nourished. No distress.  HENT:  Head: Normocephalic and atraumatic.  Right Ear: Hearing normal.  Left Ear: Hearing normal.  Nose: Nose normal.  Eyes: Conjunctivae and lids are normal. Right eye  exhibits no discharge. Left eye exhibits no discharge. No scleral icterus.  Cardiovascular: Normal rate, regular rhythm, normal heart sounds and intact distal pulses. Exam reveals no gallop and no friction rub.  No murmur heard. Pulmonary/Chest: Effort normal and breath sounds normal. No stridor. No respiratory distress. She has no wheezes. She has no rales. She exhibits no tenderness.  Musculoskeletal: Normal range of motion.  Neurological: She is alert and oriented to person, place, and time.  Skin: Skin is warm, dry and intact. Capillary refill takes less than 2 seconds. No rash noted. She is not diaphoretic. No erythema. No pallor.  Psychiatric: She has a normal mood and affect. Her speech is normal and behavior is normal. Judgment and thought content normal. Cognition and memory are normal.  Nursing note and vitals reviewed.   Results for orders placed or performed in visit on 04/16/18  Bayer DCA Hb A1c Waived  Result Value Ref Range   HB A1C (BAYER DCA - WAIVED) 8.5 (H) <7.0 %  CBC with Differential/Platelet  Result Value Ref Range   WBC 7.9 3.4 - 10.8 x10E3/uL   RBC 4.40 3.77 - 5.28 x10E6/uL   Hemoglobin 12.5 11.1 - 15.9 g/dL   Hematocrit 37.2 34.0 - 46.6 %   MCV 85 79 - 97 fL   MCH 28.4 26.6 - 33.0 pg   MCHC 33.6 31.5 - 35.7 g/dL   RDW 13.2 12.3 - 15.4 %  Platelets 328 150 - 450 x10E3/uL   Neutrophils 63 Not Estab. %   Lymphs 31 Not Estab. %   Monocytes 5 Not Estab. %   Eos 1 Not Estab. %   Basos 0 Not Estab. %   Neutrophils Absolute 4.9 1.4 - 7.0 x10E3/uL   Lymphocytes Absolute 2.4 0.7 - 3.1 x10E3/uL   Monocytes Absolute 0.4 0.1 - 0.9 x10E3/uL   EOS (ABSOLUTE) 0.1 0.0 - 0.4 x10E3/uL   Basophils Absolute 0.0 0.0 - 0.2 x10E3/uL   Immature Granulocytes 0 Not Estab. %   Immature Grans (Abs) 0.0 0.0 - 0.1 x10E3/uL  Comprehensive metabolic panel  Result Value Ref Range   Glucose 164 (H) 65 - 99 mg/dL   BUN 10 6 - 24 mg/dL   Creatinine, Ser 0.61 0.57 - 1.00 mg/dL   GFR  calc non Af Amer 110 >59 mL/min/1.73   GFR calc Af Amer 127 >59 mL/min/1.73   BUN/Creatinine Ratio 16 9 - 23   Sodium 139 134 - 144 mmol/L   Potassium 3.7 3.5 - 5.2 mmol/L   Chloride 98 96 - 106 mmol/L   CO2 27 20 - 29 mmol/L   Calcium 9.7 8.7 - 10.2 mg/dL   Total Protein 6.4 6.0 - 8.5 g/dL   Albumin 4.1 3.5 - 5.5 g/dL   Globulin, Total 2.3 1.5 - 4.5 g/dL   Albumin/Globulin Ratio 1.8 1.2 - 2.2   Bilirubin Total 0.4 0.0 - 1.2 mg/dL   Alkaline Phosphatase 114 39 - 117 IU/L   AST 15 0 - 40 IU/L   ALT 19 0 - 32 IU/L  Lipid Panel w/o Chol/HDL Ratio  Result Value Ref Range   Cholesterol, Total 190 100 - 199 mg/dL   Triglycerides 180 (H) 0 - 149 mg/dL   HDL 44 >39 mg/dL   VLDL Cholesterol Cal 36 5 - 40 mg/dL   LDL Calculated 110 (H) 0 - 99 mg/dL  Microalbumin, Urine Waived  Result Value Ref Range   Microalb, Ur Waived 30 (H) 0 - 19 mg/L   Creatinine, Urine Waived 200 10 - 300 mg/dL   Microalb/Creat Ratio <30 <30 mg/g  TSH  Result Value Ref Range   TSH 2.510 0.450 - 4.500 uIU/mL  UA/M w/rflx Culture, Routine  Result Value Ref Range   Specific Gravity, UA 1.020 1.005 - 1.030   pH, UA 6.0 5.0 - 7.5   Color, UA Yellow Yellow   Appearance Ur Hazy (A) Clear   Leukocytes, UA Negative Negative   Protein, UA Negative Negative/Trace   Glucose, UA Negative Negative   Ketones, UA Negative Negative   RBC, UA Negative Negative   Bilirubin, UA Negative Negative   Urobilinogen, Ur 0.2 0.2 - 1.0 mg/dL   Nitrite, UA Negative Negative      Assessment & Plan:   Problem List Items Addressed This Visit      Cardiovascular and Mediastinum   HTN (hypertension) - Primary    Doing well on lisinopril. Continue current regimen. Checking BMP today. Follow up 2 months for recheck A1c          Follow up plan: Return in about 2 months (around 07/16/2018) for Follow up DM.

## 2018-05-16 LAB — BASIC METABOLIC PANEL
BUN/Creatinine Ratio: 16 (ref 9–23)
BUN: 9 mg/dL (ref 6–24)
CALCIUM: 9.4 mg/dL (ref 8.7–10.2)
CHLORIDE: 99 mmol/L (ref 96–106)
CO2: 26 mmol/L (ref 20–29)
CREATININE: 0.57 mg/dL (ref 0.57–1.00)
GFR calc Af Amer: 129 mL/min/{1.73_m2} (ref >59)
GFR calc non Af Amer: 112 mL/min/{1.73_m2} (ref >59)
GLUCOSE: 148 mg/dL — AB (ref 65–99)
Potassium: 3.8 mmol/L (ref 3.5–5.2)
Sodium: 139 mmol/L (ref 134–144)

## 2018-06-08 DIAGNOSIS — R05 Cough: Secondary | ICD-10-CM | POA: Diagnosis not present

## 2018-06-08 DIAGNOSIS — J208 Acute bronchitis due to other specified organisms: Secondary | ICD-10-CM | POA: Diagnosis not present

## 2018-06-08 DIAGNOSIS — H6692 Otitis media, unspecified, left ear: Secondary | ICD-10-CM | POA: Diagnosis not present

## 2018-06-08 DIAGNOSIS — R509 Fever, unspecified: Secondary | ICD-10-CM | POA: Diagnosis not present

## 2018-06-13 ENCOUNTER — Encounter: Payer: Self-pay | Admitting: Family Medicine

## 2018-06-13 ENCOUNTER — Ambulatory Visit (INDEPENDENT_AMBULATORY_CARE_PROVIDER_SITE_OTHER): Payer: 59 | Admitting: Family Medicine

## 2018-06-13 VITALS — BP 160/89 | HR 67 | Temp 98.5°F | Wt 240.0 lb

## 2018-06-13 DIAGNOSIS — J069 Acute upper respiratory infection, unspecified: Secondary | ICD-10-CM

## 2018-06-13 DIAGNOSIS — R05 Cough: Secondary | ICD-10-CM

## 2018-06-13 DIAGNOSIS — R0602 Shortness of breath: Secondary | ICD-10-CM | POA: Diagnosis not present

## 2018-06-13 DIAGNOSIS — R059 Cough, unspecified: Secondary | ICD-10-CM

## 2018-06-13 MED ORDER — HYDROCOD POLST-CPM POLST ER 10-8 MG/5ML PO SUER: 5.0000 mL | Freq: Every evening | ORAL | 0 refills | Status: DC | PRN

## 2018-06-13 NOTE — Progress Notes (Signed)
BP (!) 160/89   Pulse 67   Temp 98.5 F (36.9 C) (Oral)   Wt 240 lb (108.9 kg)   LMP  (LMP Unknown)   SpO2 98%   BMI 46.41 kg/m    Subjective:    Patient ID: Tricia Hill, female    DOB: 05/09/1971, 48 y.o.   MRN: [id number]  HPI: Tricia Hill is a 48 y.o. female  Chief Complaint  Patient presents with  . URI    pt states she was seen at urgent care this past weekend for bronchitis and ear ache. States it is not much better, states ear ache is still bad and still very congested. States she is on day 5 of levaquin   About a week of left ear pain, productive cough, wheezing, SOB, congestion, fatigue, generalized aches. Dx'd at UC 5 days ago with bronchitis and otitis media and given levaquin and cough medicines. No fevers, chills, sweats since levaquin. Has 3 doses left. Hx of asthma, has not needed inhalers for a long time.   Relevant past medical, surgical, family and social history reviewed and updated as indicated. Interim medical history since our last visit reviewed. Allergies and medications reviewed and updated.  Review of Systems  Per HPI unless specifically indicated above     Objective:    BP (!) 160/89   Pulse 67   Temp 98.5 F (36.9 C) (Oral)   Wt 240 lb (108.9 kg)   LMP  (LMP Unknown)   SpO2 98%   BMI 46.41 kg/m   Wt Readings from Last 3 Encounters:  06/13/18 240 lb (108.9 kg)  05/15/18 243 lb 3.2 oz (110.3 kg)  04/16/18 243 lb 6 oz (110.4 kg)    Physical Exam Vitals signs and nursing note reviewed.  Constitutional:      Appearance: Normal appearance.  HENT:     Head: Atraumatic.     Right Ear: Tympanic membrane and external ear normal.     Left Ear: Tympanic membrane and external ear normal.     Nose: Congestion present.     Mouth/Throat:     Mouth: Mucous membranes are moist.     Pharynx: Posterior oropharyngeal erythema present.  Eyes:     Extraocular Movements: Extraocular movements intact.     Conjunctiva/sclera: Conjunctivae  normal.  Neck:     Musculoskeletal: Normal range of motion and neck supple.  Cardiovascular:     Rate and Rhythm: Normal rate and regular rhythm.     Heart sounds: Normal heart sounds.  Pulmonary:     Effort: Pulmonary effort is normal.     Breath sounds: Wheezing present. No rales.  Musculoskeletal: Normal range of motion.  Skin:    General: Skin is warm and dry.  Neurological:     Mental Status: She is alert and oriented to person, place, and time.  Psychiatric:        Mood and Affect: Mood normal.        Thought Content: Thought content normal.     Results for orders placed or performed in visit on 17/00/17  Basic metabolic panel  Result Value Ref Range   Glucose 148 (H) 65 - 99 mg/dL   BUN 9 6 - 24 mg/dL   Creatinine, Ser 0.57 0.57 - 1.00 mg/dL   GFR calc non Af Amer 112 >59 mL/min/1.73   GFR calc Af Amer 129 >59 mL/min/1.73   BUN/Creatinine Ratio 16 9 - 23   Sodium 139 134 - 144 mmol/L  Potassium 3.8 3.5 - 5.2 mmol/L   Chloride 99 96 - 106 mmol/L   CO2 26 20 - 29 mmol/L   Calcium 9.4 8.7 - 10.2 mg/dL      Assessment & Plan:   Problem List Items Addressed This Visit    None    Visit Diagnoses    Upper respiratory tract infection, unspecified type    -  Primary   Improving, complete levaquin and cont tessalon, mucinex, supportive care. Tussionex sent with sedation and addiction precautions   SOB (shortness of breath)       Symbicort inhaler sample given with instructions. Will try to avoid prednisone given DM.    Cough           Follow up plan: Return for as scheduled.

## 2018-06-19 ENCOUNTER — Ambulatory Visit: Payer: 59 | Admitting: Family Medicine

## 2018-06-19 ENCOUNTER — Encounter: Payer: Self-pay | Admitting: Family Medicine

## 2018-06-19 VITALS — BP 114/71 | HR 68 | Temp 98.3°F | Ht 60.0 in | Wt 241.5 lb

## 2018-06-19 DIAGNOSIS — R0981 Nasal congestion: Secondary | ICD-10-CM | POA: Diagnosis not present

## 2018-06-19 MED ORDER — PREDNISONE 50 MG PO TABS: 50.0000 mg | ORAL_TABLET | Freq: Every day | ORAL | 0 refills | Status: DC

## 2018-06-19 NOTE — Progress Notes (Signed)
BP 114/71 (BP Location: Left Arm, Patient Position: Sitting, Cuff Size: Normal)   Pulse 68   Temp 98.3 F (36.8 C) (Oral)   Ht 5' (1.524 m)   Wt 241 lb 8 oz (109.5 kg)   LMP  (LMP Unknown)   SpO2 96%   BMI 47.16 kg/m    Subjective:    Patient ID: Tricia Hill, female    DOB: [birth year], 48 y.o.   MRN: [id number]  HPI: Tricia Hill is a 48 y.o. female  Chief Complaint  Patient presents with  . URI    Ongoing 2 weeks. Saw Apolonio Schneiders 06/13/2018. Cough is better.   . Ear Pain    Left ear pain. Patient states she'll get dizzy.   . Nasal Congestion   UPPER RESPIRATORY TRACT INFECTION- cough has gotten better, but still has a lot of congestion, getting dizzy Duration: 2 weeks Worst symptom: congestion and dizziness Fever: no Cough: yes Shortness of breath: no Wheezing: no Chest pain: no Chest tightness: no Chest congestion: no Nasal congestion: yes Runny nose: no Post nasal drip: no Sneezing: no Sore throat: no Swollen glands: no Sinus pressure: yes Headache: yes Face pain: yes Toothache: no Ear pain: yes bilateral Ear pressure: yes bilateral Eyes red/itching:no Eye drainage/crusting: no  Vomiting: no Rash: no Fatigue: yes Sick contacts: yes Strep contacts: no  Context: stable Recurrent sinusitis: no Relief with OTC cold/cough medications: no  Treatments attempted: antibiotics   Relevant past medical, surgical, family and social history reviewed and updated as indicated. Interim medical history since our last visit reviewed. Allergies and medications reviewed and updated.  Review of Systems  Constitutional: Positive for fatigue. Negative for activity change, appetite change, chills, diaphoresis, fever and unexpected weight change.  HENT: Positive for congestion, ear pain, sinus pressure and sinus pain. Negative for dental problem, drooling, ear discharge, facial swelling, hearing loss, mouth sores, nosebleeds, postnasal drip, rhinorrhea, sneezing, sore  throat, tinnitus, trouble swallowing and voice change.   Respiratory: Negative.   Cardiovascular: Negative.   Psychiatric/Behavioral: Negative.     Per HPI unless specifically indicated above     Objective:    BP 114/71 (BP Location: Left Arm, Patient Position: Sitting, Cuff Size: Normal)   Pulse 68   Temp 98.3 F (36.8 C) (Oral)   Ht 5' (1.524 m)   Wt 241 lb 8 oz (109.5 kg)   LMP  (LMP Unknown)   SpO2 96%   BMI 47.16 kg/m   Wt Readings from Last 3 Encounters:  06/19/18 241 lb 8 oz (109.5 kg)  06/13/18 240 lb (108.9 kg)  05/15/18 243 lb 3.2 oz (110.3 kg)    Physical Exam Vitals signs and nursing note reviewed.  Constitutional:      General: She is not in acute distress.    Appearance: Normal appearance. She is not ill-appearing, toxic-appearing or diaphoretic.  HENT:     Head: Normocephalic and atraumatic.     Right Ear: Tympanic membrane, ear canal and external ear normal. There is no impacted cerumen.     Left Ear: Tympanic membrane, ear canal and external ear normal. There is no impacted cerumen.     Nose: Congestion and rhinorrhea present.     Mouth/Throat:     Mouth: Mucous membranes are moist.     Pharynx: Oropharynx is clear. No oropharyngeal exudate or posterior oropharyngeal erythema.  Eyes:     General: No scleral icterus.       Right eye: No discharge.  Left eye: No discharge.     Extraocular Movements: Extraocular movements intact.     Conjunctiva/sclera: Conjunctivae normal.     Pupils: Pupils are equal, round, and reactive to light.  Neck:     Musculoskeletal: Normal range of motion and neck supple. No neck rigidity or muscular tenderness.     Vascular: No carotid bruit.  Cardiovascular:     Rate and Rhythm: Normal rate and regular rhythm.     Pulses: Normal pulses.     Heart sounds: Normal heart sounds. No murmur. No friction rub. No gallop.   Pulmonary:     Effort: Pulmonary effort is normal. No respiratory distress.     Breath sounds:  Normal breath sounds. No stridor. No wheezing, rhonchi or rales.  Chest:     Chest wall: No tenderness.  Musculoskeletal: Normal range of motion.  Lymphadenopathy:     Cervical: No cervical adenopathy.  Skin:    General: Skin is warm and dry.     Capillary Refill: Capillary refill takes less than 2 seconds.     Coloration: Skin is not jaundiced or pale.     Findings: No bruising, erythema, lesion or rash.  Neurological:     General: No focal deficit present.     Mental Status: She is alert and oriented to person, place, and time. Mental status is at baseline.     Cranial Nerves: No cranial nerve deficit.     Sensory: No sensory deficit.     Motor: No weakness.     Coordination: Coordination normal.     Gait: Gait normal.     Deep Tendon Reflexes: Reflexes normal.  Psychiatric:        Mood and Affect: Mood normal.        Behavior: Behavior normal.        Thought Content: Thought content normal.        Judgment: Judgment normal.     Results for orders placed or performed in visit on 65/78/46  Basic metabolic panel  Result Value Ref Range   Glucose 148 (H) 65 - 99 mg/dL   BUN 9 6 - 24 mg/dL   Creatinine, Ser 0.57 0.57 - 1.00 mg/dL   GFR calc non Af Amer 112 >59 mL/min/1.73   GFR calc Af Amer 129 >59 mL/min/1.73   BUN/Creatinine Ratio 16 9 - 23   Sodium 139 134 - 144 mmol/L   Potassium 3.8 3.5 - 5.2 mmol/L   Chloride 99 96 - 106 mmol/L   CO2 26 20 - 29 mmol/L   Calcium 9.4 8.7 - 10.2 mg/dL      Assessment & Plan:   Problem List Items Addressed This Visit    None    Visit Diagnoses    Nasal congestion    -  Primary   No sign of infection, but very very congested. Will start prednisone. Call if not better by Monday.       Follow up plan: Return if symptoms worsen or fail to improve.

## 2018-07-15 ENCOUNTER — Encounter: Payer: Self-pay | Admitting: Family Medicine

## 2018-07-31 ENCOUNTER — Ambulatory Visit: Payer: 59 | Admitting: Family Medicine

## 2018-08-11 ENCOUNTER — Ambulatory Visit: Payer: 59 | Admitting: Family Medicine

## 2018-08-20 ENCOUNTER — Encounter: Payer: Self-pay | Admitting: Family Medicine

## 2018-08-20 ENCOUNTER — Other Ambulatory Visit: Payer: Self-pay

## 2018-08-20 ENCOUNTER — Ambulatory Visit (INDEPENDENT_AMBULATORY_CARE_PROVIDER_SITE_OTHER): Payer: 59 | Admitting: Family Medicine

## 2018-08-20 VITALS — BP 129/87 | HR 87 | Temp 98.7°F | Ht 60.0 in | Wt 236.6 lb

## 2018-08-20 DIAGNOSIS — Z111 Encounter for screening for respiratory tuberculosis: Secondary | ICD-10-CM

## 2018-08-20 DIAGNOSIS — E1165 Type 2 diabetes mellitus with hyperglycemia: Secondary | ICD-10-CM | POA: Diagnosis not present

## 2018-08-20 DIAGNOSIS — I10 Essential (primary) hypertension: Secondary | ICD-10-CM

## 2018-08-20 DIAGNOSIS — Z1239 Encounter for other screening for malignant neoplasm of breast: Secondary | ICD-10-CM

## 2018-08-20 LAB — BAYER DCA HB A1C WAIVED: HB A1C (BAYER DCA - WAIVED): 7.7 % — ABNORMAL HIGH (ref <7.0)

## 2018-08-20 MED ORDER — METFORMIN HCL 1000 MG PO TABS: 1000.0000 mg | ORAL_TABLET | Freq: Two times a day (BID) | ORAL | 1 refills | Status: DC

## 2018-08-20 MED ORDER — SEMAGLUTIDE (1 MG/DOSE) 2 MG/1.5ML ~~LOC~~ SOPN: 1.0000 mg | PEN_INJECTOR | SUBCUTANEOUS | 6 refills | Status: DC

## 2018-08-20 MED ORDER — ATORVASTATIN CALCIUM 40 MG PO TABS: 40.0000 mg | ORAL_TABLET | Freq: Every day | ORAL | 1 refills | Status: DC

## 2018-08-20 MED ORDER — LISINOPRIL 10 MG PO TABS: 10.0000 mg | ORAL_TABLET | Freq: Every day | ORAL | 1 refills | Status: DC

## 2018-08-20 NOTE — Patient Instructions (Signed)
[facility]  Address: [address], [place], [place] [address]  Phone: [phone]  

## 2018-08-20 NOTE — Assessment & Plan Note (Addendum)
Doing better with A1c of 7.7- will continue current regimen. Continue to monitor. Call with any concerns.

## 2018-08-20 NOTE — Assessment & Plan Note (Signed)
Congratulated patient on 5lb weight loss. Continue to work on diet and exercise. Call with any concerns continue ozempic.

## 2018-08-20 NOTE — Assessment & Plan Note (Signed)
Under good control on current regimen. Continue current regimen. Continue to monitor. Call with any concerns. Refills given. Labs drawn today.   

## 2018-08-20 NOTE — Progress Notes (Signed)
BP 129/87 (BP Location: Left Arm, Patient Position: Sitting, Cuff Size: Large)   Pulse 87   Temp 98.7 F (37.1 C)   Ht 5' (1.524 m)   Wt 236 lb 9 oz (107.3 kg)   LMP  (LMP Unknown)   SpO2 96%   BMI 46.20 kg/m    Subjective:    Patient ID: Tricia Hill, female    DOB: February 11, 1971, 48 y.o.   MRN: [id number]  HPI: Tricia Hill is a 48 y.o. female  Chief Complaint  Patient presents with  . Hypertension  . Diabetes   HYPERTENSION Hypertension status: controlled  Satisfied with current treatment? yes Duration of hypertension: chronic BP monitoring frequency:  not checking BP medication side effects:  no Medication compliance: excellent compliance Previous BP meds: lisinopril Aspirin: no Recurrent headaches: no Visual changes: no Palpitations: no Dyspnea: no Chest pain: no Lower extremity edema: no Dizzy/lightheaded: no  DIABETES Hypoglycemic episodes:no Polydipsia/polyuria: no Visual disturbance: no Chest pain: no Paresthesias: no Glucose Monitoring: yes  Accucheck frequency: occasionally Taking Insulin?: no Blood Pressure Monitoring: not checking Retinal Examination: Up to Date Foot Exam: Up to Date Diabetic Education: Completed Pneumovax: Up to Date Influenza: Up to Date Aspirin: no  Relevant past medical, surgical, family and social history reviewed and updated as indicated. Interim medical history since our last visit reviewed. Allergies and medications reviewed and updated.  Review of Systems  Constitutional: Negative.   Respiratory: Negative.   Cardiovascular: Negative.   Musculoskeletal: Negative.   Skin: Negative.   Neurological: Negative.   Psychiatric/Behavioral: Negative.     Per HPI unless specifically indicated above     Objective:    BP 129/87 (BP Location: Left Arm, Patient Position: Sitting, Cuff Size: Large)   Pulse 87   Temp 98.7 F (37.1 C)   Ht 5' (1.524 m)   Wt 236 lb 9 oz (107.3 kg)   LMP  (LMP Unknown)   SpO2  96%   BMI 46.20 kg/m   Wt Readings from Last 3 Encounters:  08/20/18 236 lb 9 oz (107.3 kg)  06/19/18 241 lb 8 oz (109.5 kg)  06/13/18 240 lb (108.9 kg)    Physical Exam Vitals signs and nursing note reviewed.  Constitutional:      General: She is not in acute distress.    Appearance: Normal appearance. She is not ill-appearing, toxic-appearing or diaphoretic.  HENT:     Head: Normocephalic and atraumatic.     Right Ear: External ear normal.     Left Ear: External ear normal.     Nose: Nose normal.     Mouth/Throat:     Mouth: Mucous membranes are moist.     Pharynx: Oropharynx is clear.  Eyes:     General: No scleral icterus.       Right eye: No discharge.        Left eye: No discharge.     Extraocular Movements: Extraocular movements intact.     Conjunctiva/sclera: Conjunctivae normal.     Pupils: Pupils are equal, round, and reactive to light.  Neck:     Musculoskeletal: Normal range of motion and neck supple.  Cardiovascular:     Rate and Rhythm: Normal rate and regular rhythm.     Pulses: Normal pulses.     Heart sounds: Normal heart sounds. No murmur. No friction rub. No gallop.   Pulmonary:     Effort: Pulmonary effort is normal. No respiratory distress.     Breath sounds: Normal breath  sounds. No stridor. No wheezing, rhonchi or rales.  Chest:     Chest wall: No tenderness.  Musculoskeletal: Normal range of motion.  Skin:    General: Skin is warm and dry.     Capillary Refill: Capillary refill takes less than 2 seconds.     Coloration: Skin is not jaundiced or pale.     Findings: No bruising, erythema, lesion or rash.  Neurological:     General: No focal deficit present.     Mental Status: She is alert and oriented to person, place, and time. Mental status is at baseline.  Psychiatric:        Mood and Affect: Mood normal.        Behavior: Behavior normal.        Thought Content: Thought content normal.        Judgment: Judgment normal.    Results for  orders placed or performed in visit on 94/70/96  Basic metabolic panel  Result Value Ref Range   Glucose 148 (H) 65 - 99 mg/dL   BUN 9 6 - 24 mg/dL   Creatinine, Ser 0.57 0.57 - 1.00 mg/dL   GFR calc non Af Amer 112 >59 mL/min/1.73   GFR calc Af Amer 129 >59 mL/min/1.73   BUN/Creatinine Ratio 16 9 - 23   Sodium 139 134 - 144 mmol/L   Potassium 3.8 3.5 - 5.2 mmol/L   Chloride 99 96 - 106 mmol/L   CO2 26 20 - 29 mmol/L   Calcium 9.4 8.7 - 10.2 mg/dL      Assessment & Plan:   Problem List Items Addressed This Visit      Cardiovascular and Mediastinum   HTN (hypertension) - Primary    Under good control on current regimen. Continue current regimen. Continue to monitor. Call with any concerns. Refills given. Labs drawn today.       Relevant Medications   lisinopril (PRINIVIL,ZESTRIL) 10 MG tablet   atorvastatin (LIPITOR) 40 MG tablet   Other Relevant Orders   Basic metabolic panel     Endocrine   Uncontrolled diabetes mellitus ([place])    Doing better with A1c of 7.7- will continue current regimen. Continue to monitor. Call with any concerns.       Relevant Medications   metFORMIN (GLUCOPHAGE) 1000 MG tablet   Semaglutide, 1 MG/DOSE, (OZEMPIC, 1 MG/DOSE,) 2 MG/1.5ML SOPN   lisinopril (PRINIVIL,ZESTRIL) 10 MG tablet   atorvastatin (LIPITOR) 40 MG tablet   Other Relevant Orders   Bayer DCA Hb A1c Waived     Other   Morbid obesity ([place])    Congratulated patient on 5lb weight loss. Continue to work on diet and exercise. Call with any concerns continue ozempic.       Relevant Medications   metFORMIN (GLUCOPHAGE) 1000 MG tablet   Semaglutide, 1 MG/DOSE, (OZEMPIC, 1 MG/DOSE,) 2 MG/1.5ML SOPN    Other Visit Diagnoses    Screening for tuberculosis       Quantiferon drawn today. Await results.    Relevant Orders   QuantiFERON-TB Gold Plus   Screening for breast cancer       Mammogram ordered today.   Relevant Orders   MM DIGITAL SCREENING BILATERAL       Follow up  plan: Return in about 3 months (around 11/20/2018) for Follow up sugars.

## 2018-08-21 LAB — BASIC METABOLIC PANEL
BUN / CREAT RATIO: 16 (ref 9–23)
BUN: 11 mg/dL (ref 6–24)
CO2: 27 mmol/L (ref 20–29)
Calcium: 9.5 mg/dL (ref 8.7–10.2)
Chloride: 97 mmol/L (ref 96–106)
Creatinine, Ser: 0.67 mg/dL (ref 0.57–1.00)
GFR calc Af Amer: 122 mL/min/{1.73_m2} (ref >59)
GFR calc non Af Amer: 106 mL/min/{1.73_m2} (ref >59)
GLUCOSE: 139 mg/dL — AB (ref 65–99)
Potassium: 4 mmol/L (ref 3.5–5.2)
Sodium: 139 mmol/L (ref 134–144)

## 2018-08-23 LAB — QUANTIFERON-TB GOLD PLUS
QUANTIFERON-TB GOLD PLUS: NEGATIVE
QuantiFERON Mitogen Value: >10 IU/mL
QuantiFERON Nil Value: 0.02 IU/mL
QuantiFERON TB1 Ag Value: 0.02 IU/mL
QuantiFERON TB2 Ag Value: 0.01 IU/mL

## 2018-11-20 ENCOUNTER — Ambulatory Visit (INDEPENDENT_AMBULATORY_CARE_PROVIDER_SITE_OTHER): Payer: 59 | Admitting: Family Medicine

## 2018-11-20 ENCOUNTER — Encounter: Payer: Self-pay | Admitting: Family Medicine

## 2018-11-20 ENCOUNTER — Other Ambulatory Visit: Payer: Self-pay

## 2018-11-20 ENCOUNTER — Telehealth: Payer: Self-pay | Admitting: General Surgery

## 2018-11-20 VITALS — BP 121/80 | Ht 60.0 in | Wt 241.0 lb

## 2018-11-20 DIAGNOSIS — I10 Essential (primary) hypertension: Secondary | ICD-10-CM

## 2018-11-20 DIAGNOSIS — E1169 Type 2 diabetes mellitus with other specified complication: Secondary | ICD-10-CM

## 2018-11-20 DIAGNOSIS — E1165 Type 2 diabetes mellitus with hyperglycemia: Secondary | ICD-10-CM

## 2018-11-20 DIAGNOSIS — E785 Hyperlipidemia, unspecified: Secondary | ICD-10-CM | POA: Diagnosis not present

## 2018-11-20 MED ORDER — OMEPRAZOLE 20 MG PO CPDR: 20.0000 mg | DELAYED_RELEASE_CAPSULE | Freq: Every day | ORAL | 12 refills | Status: DC

## 2018-11-20 MED ORDER — LISINOPRIL 10 MG PO TABS: 10.0000 mg | ORAL_TABLET | Freq: Every day | ORAL | 1 refills | Status: DC

## 2018-11-20 MED ORDER — METFORMIN HCL 1000 MG PO TABS: 1000.0000 mg | ORAL_TABLET | Freq: Two times a day (BID) | ORAL | 1 refills | Status: DC

## 2018-11-20 MED ORDER — ATORVASTATIN CALCIUM 40 MG PO TABS: 40.0000 mg | ORAL_TABLET | Freq: Every day | ORAL | 1 refills | Status: DC

## 2018-11-20 MED ORDER — OZEMPIC (1 MG/DOSE) 2 MG/1.5ML ~~LOC~~ SOPN: 1.0000 mg | PEN_INJECTOR | SUBCUTANEOUS | 6 refills | Status: DC

## 2018-11-20 NOTE — Telephone Encounter (Signed)
Patient called asking if you would give her a call when you got a chance. Please call patient and advise.

## 2018-11-20 NOTE — Telephone Encounter (Signed)
This message is for [facility]

## 2018-11-20 NOTE — Assessment & Plan Note (Signed)
Encouraged diet and exercise with goal of losing 1-2lbs per week. Continue to monitor. Continue ozempic. Call with any concerns.

## 2018-11-20 NOTE — Progress Notes (Signed)
BP 121/80 (BP Location: Left Arm)   Ht 5' (1.524 m)   Wt 241 lb (109.3 kg)   LMP  (LMP Unknown)   BMI 47.07 kg/m    Subjective:    Patient ID: Tricia Hill, female    DOB: 06-Sep-1970, 48 y.o.   MRN: [id number]  HPI: Tricia Hill is a 48 y.o. female  Chief Complaint  Patient presents with  . Diabetes    4mf/u   DIABETES- working on a program through [employer], doing well.  Hypoglycemic episodes:no Polydipsia/polyuria: no Visual disturbance: no Chest pain: no Paresthesias: no Glucose Monitoring: yes  Accucheck frequency: Daily Taking Insulin?: no Blood Pressure Monitoring: not checking Retinal Examination: Up to Date Foot Exam: Up to Date Diabetic Education: Completed Pneumovax: Up to Date Influenza: Up to Date Aspirin: no  HYPERTENSION / [place] Satisfied with current treatment? yes Duration of hypertension: chronic BP monitoring frequency: not checking BP range:  BP medication side effects: no Past BP meds: lisinopril Duration of hyperlipidemia: chronic Cholesterol medication side effects: no Cholesterol supplements: none Past cholesterol medications: atorvastatin Medication compliance: excellent compliance Aspirin: no Recent stressors: no Recurrent headaches: no Visual changes: no Palpitations: no Dyspnea: no Chest pain: no Lower extremity edema: no Dizzy/lightheaded: no  Relevant past medical, surgical, family and social history reviewed and updated as indicated. Interim medical history since our last visit reviewed. Allergies and medications reviewed and updated.  Review of Systems  Constitutional: Negative.   Respiratory: Negative.   Cardiovascular: Negative.   Musculoskeletal: Negative.   Neurological: Negative.   Psychiatric/Behavioral: Negative.     Per HPI unless specifically indicated above     Objective:    BP 121/80 (BP Location: Left Arm)   Ht 5' (1.524 m)   Wt 241 lb (109.3 kg)   LMP  (LMP Unknown)   BMI  47.07 kg/m   Wt Readings from Last 3 Encounters:  11/20/18 241 lb (109.3 kg)  08/20/18 236 lb 9 oz (107.3 kg)  06/19/18 241 lb 8 oz (109.5 kg)    Physical Exam Vitals signs and nursing note reviewed.  Constitutional:      General: She is not in acute distress.    Appearance: Normal appearance. She is not ill-appearing, toxic-appearing or diaphoretic.  HENT:     Head: Normocephalic and atraumatic.     Right Ear: External ear normal.     Left Ear: External ear normal.     Nose: Nose normal.     Mouth/Throat:     Mouth: Mucous membranes are moist.     Pharynx: Oropharynx is clear.  Eyes:     General: No scleral icterus.       Right eye: No discharge.        Left eye: No discharge.     Conjunctiva/sclera: Conjunctivae normal.     Pupils: Pupils are equal, round, and reactive to light.  Neck:     Musculoskeletal: Normal range of motion.  Pulmonary:     Effort: Pulmonary effort is normal. No respiratory distress.     Comments: Speaking in full sentences Musculoskeletal: Normal range of motion.  Skin:    Coloration: Skin is not jaundiced or pale.     Findings: No bruising, erythema, lesion or rash.  Neurological:     Mental Status: She is alert and oriented to person, place, and time. Mental status is at baseline.  Psychiatric:        Mood and Affect: Mood normal.  Behavior: Behavior normal.        Thought Content: Thought content normal.        Judgment: Judgment normal.     Results for orders placed or performed in visit on 08/20/18  Bayer DCA Hb A1c Waived  Result Value Ref Range   HB A1C (BAYER DCA - WAIVED) 7.7 (H) <0.3 %  Basic metabolic panel  Result Value Ref Range   Glucose 139 (H) 65 - 99 mg/dL   BUN 11 6 - 24 mg/dL   Creatinine, Ser 0.67 0.57 - 1.00 mg/dL   GFR calc non Af Amer 106 >59 mL/min/1.73   GFR calc Af Amer 122 >59 mL/min/1.73   BUN/Creatinine Ratio 16 9 - 23   Sodium 139 134 - 144 mmol/L   Potassium 4.0 3.5 - 5.2 mmol/L   Chloride 97 96 -  106 mmol/L   CO2 27 20 - 29 mmol/L   Calcium 9.5 8.7 - 10.2 mg/dL  QuantiFERON-TB Gold Plus  Result Value Ref Range   QuantiFERON Incubation Incubation performed.    QuantiFERON Criteria Comment    QuantiFERON TB1 Ag Value 0.02 IU/mL   QuantiFERON TB2 Ag Value 0.01 IU/mL   QuantiFERON Nil Value 0.02 IU/mL   QuantiFERON Mitogen Value >10.00 IU/mL   QuantiFERON-TB Gold Plus Negative Negative      Assessment & Plan:   Problem List Items Addressed This Visit      Cardiovascular and Mediastinum   HTN (hypertension) - Primary    Under good control on current regimen. Continue current regimen. Continue to monitor. Call with any concerns. Refills given. Labs to be drawn.        Relevant Medications   atorvastatin (LIPITOR) 40 MG tablet   lisinopril (ZESTRIL) 10 MG tablet   Other Relevant Orders   Comprehensive metabolic panel     Endocrine   Uncontrolled diabetes mellitus ([place])    Working with [facility] on her sugars. Doing better. Will recheck A1c and adjust medicine as needed. Refills given. Call with any concerns.      Relevant Medications   atorvastatin (LIPITOR) 40 MG tablet   lisinopril (ZESTRIL) 10 MG tablet   metFORMIN (GLUCOPHAGE) 1000 MG tablet   Semaglutide, 1 MG/DOSE, (OZEMPIC, 1 MG/DOSE,) 2 MG/1.5ML SOPN   Other Relevant Orders   Bayer DCA Hb A1c Waived   Comprehensive metabolic panel   Hyperlipidemia associated with type 2 diabetes mellitus ([place])    Under good control on current regimen. Continue current regimen. Continue to monitor. Call with any concerns. Refills given. Labs to be drawn.        Relevant Medications   atorvastatin (LIPITOR) 40 MG tablet   lisinopril (ZESTRIL) 10 MG tablet   metFORMIN (GLUCOPHAGE) 1000 MG tablet   Semaglutide, 1 MG/DOSE, (OZEMPIC, 1 MG/DOSE,) 2 MG/1.5ML SOPN   Other Relevant Orders   Comprehensive metabolic panel   Lipid Panel w/o Chol/HDL Ratio     Other   Morbid obesity (HCC)    Encouraged diet and exercise with  goal of losing 1-2lbs per week. Continue to monitor. Continue ozempic. Call with any concerns.       Relevant Medications   metFORMIN (GLUCOPHAGE) 1000 MG tablet   Semaglutide, 1 MG/DOSE, (OZEMPIC, 1 MG/DOSE,) 2 MG/1.5ML SOPN       Follow up plan: Return in about 3 months (around 02/20/2019) for follow up DM.   Marland Kitchen This visit was completed via Doximity due to the restrictions of the COVID-19 pandemic. All issues as above were  discussed and addressed. Physical exam was done as above through visual confirmation on Doximity. If it was felt that the patient should be evaluated in the office, they were directed there. The patient verbally consented to this visit. . Location of the patient: home . Location of the provider: work . Those involved with this call:  . Provider: Park Liter, DO . CMA: Lesle Chris, [place] . Front Desk/Registration: Don Perking  . Time spent on call: 25 minutes with patient face to face via video conference. More than 50% of this time was spent in counseling and coordination of care. 40 minutes total spent in review of patient's record and preparation of their chart.

## 2018-11-20 NOTE — Assessment & Plan Note (Signed)
Under good control on current regimen. Continue current regimen. Continue to monitor. Call with any concerns. Refills given. Labs to be drawn.  

## 2018-11-20 NOTE — Assessment & Plan Note (Signed)
Working [facility] on her sugars. Doing better. Will recheck A1c and adjust medicine as needed. Refills given. Call with any concerns.

## 2018-11-20 NOTE — Telephone Encounter (Signed)
She called asking about her son, Tricia Hill. She states he is having pain at the top of his buttock, no drainage. [place] and will let me know if she decides to take him to a walk in clinic or not.

## 2019-02-11 ENCOUNTER — Encounter: Payer: Self-pay | Admitting: Family Medicine

## 2019-02-11 ENCOUNTER — Other Ambulatory Visit: Payer: Self-pay

## 2019-02-11 ENCOUNTER — Ambulatory Visit (INDEPENDENT_AMBULATORY_CARE_PROVIDER_SITE_OTHER): Payer: 59 | Admitting: Family Medicine

## 2019-02-11 VITALS — BP 137/82 | HR 83 | Wt 237.0 lb

## 2019-02-11 DIAGNOSIS — E1165 Type 2 diabetes mellitus with hyperglycemia: Secondary | ICD-10-CM | POA: Diagnosis not present

## 2019-02-11 DIAGNOSIS — G4733 Obstructive sleep apnea (adult) (pediatric): Secondary | ICD-10-CM | POA: Diagnosis not present

## 2019-02-11 DIAGNOSIS — I10 Essential (primary) hypertension: Secondary | ICD-10-CM

## 2019-02-11 DIAGNOSIS — F419 Anxiety disorder, unspecified: Secondary | ICD-10-CM | POA: Diagnosis not present

## 2019-02-11 DIAGNOSIS — E1169 Type 2 diabetes mellitus with other specified complication: Secondary | ICD-10-CM

## 2019-02-11 DIAGNOSIS — E785 Hyperlipidemia, unspecified: Secondary | ICD-10-CM | POA: Diagnosis not present

## 2019-02-11 LAB — BAYER DCA HB A1C WAIVED: HB A1C (BAYER DCA - WAIVED): 7.2 % — ABNORMAL HIGH (ref <7.0)

## 2019-02-11 MED ORDER — LORAZEPAM 0.5 MG PO TABS: 0.5000 mg | ORAL_TABLET | Freq: Every evening | ORAL | 0 refills | Status: DC | PRN

## 2019-02-11 MED ORDER — LOSARTAN POTASSIUM 25 MG PO TABS: 25.0000 mg | ORAL_TABLET | Freq: Every day | ORAL | 3 refills | Status: DC

## 2019-02-11 NOTE — Assessment & Plan Note (Signed)
Could not tolerate lisinopril due to cough- will change to losartan and recheck 1 month.

## 2019-02-11 NOTE — Assessment & Plan Note (Signed)
CPAP not working. Needs new sleep study with new mask and machine. Will get that set up for her.

## 2019-02-11 NOTE — Progress Notes (Signed)
BP 137/82   Pulse 83   Wt 237 lb (107.5 kg)   LMP  (LMP Unknown)   BMI 46.29 kg/m    Subjective:    Patient ID: Tricia Hill, female    DOB: 1970-07-28, 48 y.o.   MRN: [id number]  HPI: Tricia Hill is a 48 y.o. female  Chief Complaint  Patient presents with  . Sleep Apnea    Discuss C-pap  . Hypertension    Stopped Lisinopril due to a cough   HYPERTENSION Hypertension status: stable  Satisfied with current treatment? no Duration of hypertension: chronic BP monitoring frequency:  not checking BP medication side effects:  no Medication compliance: poor compliance Previous BP meds: lisinopril Aspirin: no Recurrent headaches: no Visual changes: no Palpitations: no Dyspnea: no Chest pain: no Lower extremity edema: no Dizzy/lightheaded: no  SLEEP APNEA- her CPAP machine has not been working Sleep apnea status: uncontrolled Duration: chronic Satisfied with current treatment?:  no CPAP use:  yes Sleep quality with CPAP use: poor Treament compliance:excellent compliance Last sleep study: 2008/2009 Treatments attempted: CPAP Wakes feeling refreshed:  no Daytime hypersomnolence:  yes Fatigue:  yes Insomnia:  yes Good sleep hygiene:  yes Difficulty falling asleep:  yes Difficulty staying asleep:  yes Snoring bothers bed partner:  yes Observed apnea by bed partner: yes Obesity:  yes Hypertension: yes  Pulmonary hypertension:  no Coronary artery disease:  no  Has been under a huge amount of stress recently. Her mother is in the hospital- she has not been home in 3 weeks because her husband and son tested + for COVID- so she's been staying with a friend. Her husband just went to [facility] with COVID and her dog died. She notes that she is not sleeping. She is crying a lot. She is not feeling like herself.   Relevant past medical, surgical, family and social history reviewed and updated as indicated. Interim medical history since our last visit reviewed.  Allergies and medications reviewed and updated.  Review of Systems  Constitutional: Negative.   Respiratory: Negative.   Cardiovascular: Negative.   Musculoskeletal: Negative.   Neurological: Negative.   Psychiatric/Behavioral: Positive for sleep disturbance. Negative for agitation, behavioral problems, confusion, decreased concentration, dysphoric mood, hallucinations and self-injury. The patient is nervous/anxious. The patient is not hyperactive.     Per HPI unless specifically indicated above     Objective:    BP 137/82   Pulse 83   Wt 237 lb (107.5 kg)   LMP  (LMP Unknown)   BMI 46.29 kg/m   Wt Readings from Last 3 Encounters:  02/11/19 237 lb (107.5 kg)  11/20/18 241 lb (109.3 kg)  08/20/18 236 lb 9 oz (107.3 kg)    Physical Exam Vitals signs and nursing note reviewed.  Constitutional:      General: She is not in acute distress.    Appearance: Normal appearance. She is not ill-appearing, toxic-appearing or diaphoretic.  HENT:     Head: Normocephalic and atraumatic.     Right Ear: External ear normal.     Left Ear: External ear normal.     Nose: Nose normal.     Mouth/Throat:     Mouth: Mucous membranes are moist.     Pharynx: Oropharynx is clear.  Eyes:     General: No scleral icterus.       Right eye: No discharge.        Left eye: No discharge.     Conjunctiva/sclera: Conjunctivae normal.  Pupils: Pupils are equal, round, and reactive to light.  Neck:     Musculoskeletal: Normal range of motion.  Pulmonary:     Effort: Pulmonary effort is normal. No respiratory distress.     Comments: Speaking in full sentences Musculoskeletal: Normal range of motion.  Skin:    Coloration: Skin is not jaundiced or pale.     Findings: No bruising, erythema, lesion or rash.  Neurological:     Mental Status: She is alert and oriented to person, place, and time. Mental status is at baseline.  Psychiatric:        Mood and Affect: Mood normal.        Behavior: Behavior  normal.        Thought Content: Thought content normal.        Judgment: Judgment normal.     Results for orders placed or performed in visit on 08/20/18  Bayer DCA Hb A1c Waived  Result Value Ref Range   HB A1C (BAYER DCA - WAIVED) 7.7 (H) [id number] %  Basic metabolic panel  Result Value Ref Range   Glucose 139 (H) 65 - 99 mg/dL   BUN 11 6 - 24 mg/dL   Creatinine, Ser 0.67 0.57 - 1.00 mg/dL   GFR calc non Af Amer 106 >59 mL/min/1.73   GFR calc Af Amer 122 >59 mL/min/1.73   BUN/Creatinine Ratio 16 9 - 23   Sodium 139 134 - 144 mmol/L   Potassium 4.0 3.5 - 5.2 mmol/L   Chloride 97 96 - 106 mmol/L   CO2 27 20 - 29 mmol/L   Calcium 9.5 8.7 - 10.2 mg/dL  QuantiFERON-TB Gold Plus  Result Value Ref Range   QuantiFERON Incubation Incubation performed.    QuantiFERON Criteria Comment    QuantiFERON TB1 Ag Value 0.02 IU/mL   QuantiFERON TB2 Ag Value 0.01 IU/mL   QuantiFERON Nil Value 0.02 IU/mL   QuantiFERON Mitogen Value >10.00 IU/mL   QuantiFERON-TB Gold Plus Negative Negative      Assessment & Plan:   Problem List Items Addressed This Visit      Cardiovascular and Mediastinum   HTN (hypertension)    Could not tolerate lisinopril due to cough- will change to losartan and recheck 1 month.       Relevant Medications   losartan (COZAAR) 25 MG tablet     Respiratory   OSA (obstructive sleep apnea) - Primary    CPAP not working. Needs new sleep study with new mask and machine. Will get that set up for her.       Relevant Orders   Ambulatory referral to Sleep Studies    Other Visit Diagnoses    Acute anxiety       Has a lot on her plate right now. Not doing well. Short course of ativan to help her sleep. Call with any concerns.    Relevant Medications   LORazepam (ATIVAN) 0.5 MG tablet       Follow up plan: Return in about 4 weeks (around 03/11/2019).    . This visit was completed via Doximity due to the restrictions of the COVID-19 pandemic. All issues as above were  discussed and addressed. Physical exam was done as above through visual confirmation on Doximity. If it was felt that the patient should be evaluated in the office, they were directed there. The patient verbally consented to this visit. . Location of the patient: home . Location of the provider: home . Those involved with this call:  .  Provider: Park Liter, DO . CMA: Tiffany Reel, CMA . Front Desk/Registration: Don Perking  . Time spent on call: 25 minutes with patient face to face via video conference. More than 50% of this time was spent in counseling and coordination of care. 40 minutes total spent in review of patient's record and preparation of their chart.

## 2019-02-12 LAB — COMPREHENSIVE METABOLIC PANEL
ALT: 27 IU/L (ref 0–32)
AST: 14 IU/L (ref 0–40)
Albumin/Globulin Ratio: 2.1 (ref 1.2–2.2)
Albumin: 4.5 g/dL (ref 3.8–4.8)
Alkaline Phosphatase: 124 IU/L — ABNORMAL HIGH (ref 39–117)
BUN/Creatinine Ratio: 18 (ref 9–23)
BUN: 12 mg/dL (ref 6–24)
Bilirubin Total: 0.3 mg/dL (ref 0.0–1.2)
CO2: 26 mmol/L (ref 20–29)
Calcium: 9.5 mg/dL (ref 8.7–10.2)
Chloride: 100 mmol/L (ref 96–106)
Creatinine, Ser: 0.68 mg/dL (ref 0.57–1.00)
GFR calc Af Amer: 121 mL/min/{1.73_m2} (ref >59)
GFR calc non Af Amer: 105 mL/min/{1.73_m2} (ref >59)
Globulin, Total: 2.1 g/dL (ref 1.5–4.5)
Glucose: 171 mg/dL — ABNORMAL HIGH (ref 65–99)
Potassium: 3.7 mmol/L (ref 3.5–5.2)
Sodium: 141 mmol/L (ref 134–144)
Total Protein: 6.6 g/dL (ref 6.0–8.5)

## 2019-02-12 LAB — LIPID PANEL W/O CHOL/HDL RATIO
Cholesterol, Total: 193 mg/dL (ref 100–199)
HDL: 40 mg/dL (ref >39)
LDL Chol Calc (NIH): 108 mg/dL — ABNORMAL HIGH (ref 0–99)
Triglycerides: 261 mg/dL — ABNORMAL HIGH (ref 0–149)
VLDL Cholesterol Cal: 45 mg/dL — ABNORMAL HIGH (ref 5–40)

## 2019-03-07 DIAGNOSIS — G4733 Obstructive sleep apnea (adult) (pediatric): Secondary | ICD-10-CM | POA: Diagnosis not present

## 2019-03-13 ENCOUNTER — Other Ambulatory Visit: Payer: Self-pay

## 2019-03-13 ENCOUNTER — Ambulatory Visit (INDEPENDENT_AMBULATORY_CARE_PROVIDER_SITE_OTHER): Payer: 59 | Admitting: Family Medicine

## 2019-03-13 ENCOUNTER — Encounter: Payer: Self-pay | Admitting: Family Medicine

## 2019-03-13 VITALS — BP 111/74 | HR 75 | Temp 99.0°F | Wt 230.0 lb

## 2019-03-13 DIAGNOSIS — I10 Essential (primary) hypertension: Secondary | ICD-10-CM | POA: Diagnosis not present

## 2019-03-13 DIAGNOSIS — R8290 Unspecified abnormal findings in urine: Secondary | ICD-10-CM | POA: Diagnosis not present

## 2019-03-13 DIAGNOSIS — F419 Anxiety disorder, unspecified: Secondary | ICD-10-CM | POA: Diagnosis not present

## 2019-03-13 MED ORDER — LOSARTAN POTASSIUM 25 MG PO TABS: 25.0000 mg | ORAL_TABLET | Freq: Every day | ORAL | 1 refills | Status: DC

## 2019-03-13 NOTE — Assessment & Plan Note (Signed)
Doing well on the losartan. BP under good control. Continue current regimen. Continue to monitor. Call with any concerns. Refills given today.

## 2019-03-13 NOTE — Progress Notes (Signed)
BP 111/74   Pulse 75   Temp 99 F (37.2 C) (Oral)   Wt 230 lb (104.3 kg)   LMP  (LMP Unknown)   SpO2 96%   BMI 44.92 kg/m    Subjective:    Patient ID: Tricia Hill, female    DOB: May 06, 1971, 48 y.o.   MRN: [id number]  HPI: Tricia Hill is a 48 y.o. female  Chief Complaint  Patient presents with  . Anxiety  . Hypertension   HYPERTENSION Hypertension status: controlled  Satisfied with current treatment? yes Duration of hypertension: chronic BP monitoring frequency:  not checking BP medication side effects:  no Medication compliance: excellent compliance Previous BP meds: losartan Aspirin: no Recurrent headaches: no Visual changes: no Palpitations: no Dyspnea: no Chest pain: no Lower extremity edema: no Dizzy/lightheaded: no  ANXIETY/STRESS Duration:better Anxious mood: no  Excessive worrying: no Irritability: no  Sweating: no Nausea: no Palpitations:no Hyperventilation: no Panic attacks: no Agoraphobia: no  Obscessions/compulsions: no Depressed mood: no Depression screen [facility] 2/9 03/13/2019 04/16/2018 02/28/2017 11/08/2015 02/08/2015  Decreased Interest 0 0 0 0 0  Down, Depressed, Hopeless 0 0 0 0 0  PHQ - 2 Score 0 0 0 0 0  Altered sleeping 1 0 - - -  Tired, decreased energy 0 3 - - -  Change in appetite 0 0 - - -  Feeling bad or failure about yourself  0 0 - - -  Trouble concentrating 0 0 - - -  Moving slowly or fidgety/restless 0 0 - - -  Suicidal thoughts 0 0 - - -  PHQ-9 Score 1 3 - - -  Difficult doing work/chores Not difficult at all Not difficult at all - - -   Anhedonia: no Weight changes: no Insomnia: no   Hypersomnia: no Fatigue/loss of energy: no Feelings of worthlessness: no Feelings of guilt: no Impaired concentration/indecisiveness: no Suicidal ideations: no  Crying spells: no Recent Stressors/Life Changes: no   Relationship problems: no   Family stress: no     Financial stress: no    Job stress: no    Recent  death/loss: no   Relevant past medical, surgical, family and social history reviewed and updated as indicated. Interim medical history since our last visit reviewed. Allergies and medications reviewed and updated.  Review of Systems  Constitutional: Negative.   Respiratory: Negative.   Cardiovascular: Negative.   Genitourinary: Negative.   Musculoskeletal: Negative.   Psychiatric/Behavioral: Negative.     Per HPI unless specifically indicated above     Objective:    BP 111/74   Pulse 75   Temp 99 F (37.2 C) (Oral)   Wt 230 lb (104.3 kg)   LMP  (LMP Unknown)   SpO2 96%   BMI 44.92 kg/m   Wt Readings from Last 3 Encounters:  03/13/19 230 lb (104.3 kg)  02/11/19 237 lb (107.5 kg)  11/20/18 241 lb (109.3 kg)    Physical Exam Vitals signs and nursing note reviewed.  Constitutional:      General: She is not in acute distress.    Appearance: Normal appearance. She is not ill-appearing, toxic-appearing or diaphoretic.  HENT:     Head: Normocephalic and atraumatic.     Right Ear: External ear normal.     Left Ear: External ear normal.     Nose: Nose normal.     Mouth/Throat:     Mouth: Mucous membranes are moist.     Pharynx: Oropharynx is clear.  Eyes:  General: No scleral icterus.       Right eye: No discharge.        Left eye: No discharge.     Extraocular Movements: Extraocular movements intact.     Conjunctiva/sclera: Conjunctivae normal.     Pupils: Pupils are equal, round, and reactive to light.  Neck:     Musculoskeletal: Normal range of motion and neck supple.  Cardiovascular:     Rate and Rhythm: Normal rate and regular rhythm.     Pulses: Normal pulses.     Heart sounds: Normal heart sounds. No murmur. No friction rub. No gallop.   Pulmonary:     Effort: Pulmonary effort is normal. No respiratory distress.     Breath sounds: Normal breath sounds. No stridor. No wheezing, rhonchi or rales.  Chest:     Chest wall: No tenderness.  Musculoskeletal:  Normal range of motion.  Skin:    General: Skin is warm and dry.     Capillary Refill: Capillary refill takes less than 2 seconds.     Coloration: Skin is not jaundiced or pale.     Findings: No bruising, erythema, lesion or rash.  Neurological:     General: No focal deficit present.     Mental Status: She is alert and oriented to person, place, and time. Mental status is at baseline.  Psychiatric:        Mood and Affect: Mood normal.        Behavior: Behavior normal.        Thought Content: Thought content normal.        Judgment: Judgment normal.     Results for orders placed or performed in visit on 02/11/19  Lipid Panel w/o Chol/HDL Ratio  Result Value Ref Range   Cholesterol, Total 193 100 - 199 mg/dL   Triglycerides 261 (H) 0 - 149 mg/dL   HDL 40 >39 mg/dL   VLDL Cholesterol Cal 45 (H) 5 - 40 mg/dL   LDL Chol Calc (NIH) 108 (H) 0 - 99 mg/dL  Bayer DCA Hb A1c Waived  Result Value Ref Range   HB A1C (BAYER DCA - WAIVED) 7.2 (H) <7.0 %  Comprehensive metabolic panel  Result Value Ref Range   Glucose 171 (H) 65 - 99 mg/dL   BUN 12 6 - 24 mg/dL   Creatinine, Ser 0.68 0.57 - 1.00 mg/dL   GFR calc non Af Amer 105 >59 mL/min/1.73   GFR calc Af Amer 121 >59 mL/min/1.73   BUN/Creatinine Ratio 18 9 - 23   Sodium 141 134 - 144 mmol/L   Potassium 3.7 3.5 - 5.2 mmol/L   Chloride 100 96 - 106 mmol/L   CO2 26 20 - 29 mmol/L   Calcium 9.5 8.7 - 10.2 mg/dL   Total Protein 6.6 6.0 - 8.5 g/dL   Albumin 4.5 3.8 - 4.8 g/dL   Globulin, Total 2.1 1.5 - 4.5 g/dL   Albumin/Globulin Ratio 2.1 1.2 - 2.2   Bilirubin Total 0.3 0.0 - 1.2 mg/dL   Alkaline Phosphatase 124 (H) 39 - 117 IU/L   AST 14 0 - 40 IU/L   ALT 27 0 - 32 IU/L      Assessment & Plan:   Problem List Items Addressed This Visit      Cardiovascular and Mediastinum   HTN (hypertension) - Primary    Doing well on the losartan. BP under good control. Continue current regimen. Continue to monitor. Call with any concerns.  Refills given today.  Relevant Medications   losartan (COZAAR) 25 MG tablet   Other Relevant Orders   Basic metabolic panel    Other Visit Diagnoses    Foul smelling urine       No other symptoms- will check urine. Await results. Call with any concerns.    Relevant Orders   UA/M w/rflx Culture, Routine   Acute anxiety       Doing much better. Back home and family is healthy again. Continue to monitor. Call with any concerns.        Follow up plan: Return in about 2 months (around 05/13/2019) for DM follow up.

## 2019-03-14 LAB — BASIC METABOLIC PANEL
BUN/Creatinine Ratio: 18 (ref 9–23)
BUN: 12 mg/dL (ref 6–24)
CO2: 25 mmol/L (ref 20–29)
Calcium: 9.6 mg/dL (ref 8.7–10.2)
Chloride: 101 mmol/L (ref 96–106)
Creatinine, Ser: 0.66 mg/dL (ref 0.57–1.00)
GFR calc Af Amer: 123 mL/min/{1.73_m2} (ref >59)
GFR calc non Af Amer: 106 mL/min/{1.73_m2} (ref >59)
Glucose: 136 mg/dL — ABNORMAL HIGH (ref 65–99)
Potassium: 3.8 mmol/L (ref 3.5–5.2)
Sodium: 142 mmol/L (ref 134–144)

## 2019-03-15 LAB — UA/M W/RFLX CULTURE, ROUTINE
Bilirubin, UA: NEGATIVE
Glucose, UA: NEGATIVE
Ketones, UA: NEGATIVE
Nitrite, UA: NEGATIVE
Protein,UA: NEGATIVE
RBC, UA: NEGATIVE
Specific Gravity, UA: 1.025 (ref 1.005–1.030)
Urobilinogen, Ur: 0.2 mg/dL (ref 0.2–1.0)
pH, UA: 5 (ref 5.0–7.5)

## 2019-03-15 LAB — MICROSCOPIC EXAMINATION: RBC, Urine: NONE SEEN /hpf (ref 0–2)

## 2019-03-15 LAB — URINE CULTURE, REFLEX

## 2019-03-31 DIAGNOSIS — G4733 Obstructive sleep apnea (adult) (pediatric): Secondary | ICD-10-CM | POA: Diagnosis not present

## 2019-04-06 DIAGNOSIS — G4733 Obstructive sleep apnea (adult) (pediatric): Secondary | ICD-10-CM | POA: Diagnosis not present

## 2019-04-06 DIAGNOSIS — E1140 Type 2 diabetes mellitus with diabetic neuropathy, unspecified: Secondary | ICD-10-CM | POA: Diagnosis not present

## 2019-04-06 DIAGNOSIS — I10 Essential (primary) hypertension: Secondary | ICD-10-CM | POA: Diagnosis not present

## 2019-04-22 DIAGNOSIS — G4733 Obstructive sleep apnea (adult) (pediatric): Secondary | ICD-10-CM | POA: Diagnosis not present

## 2019-05-15 ENCOUNTER — Ambulatory Visit (INDEPENDENT_AMBULATORY_CARE_PROVIDER_SITE_OTHER): Payer: 59 | Admitting: Family Medicine

## 2019-05-15 ENCOUNTER — Other Ambulatory Visit: Payer: Self-pay

## 2019-05-15 ENCOUNTER — Encounter: Payer: Self-pay | Admitting: Family Medicine

## 2019-05-15 VITALS — BP 133/76 | HR 69 | Temp 97.4°F | Wt 242.0 lb

## 2019-05-15 DIAGNOSIS — E785 Hyperlipidemia, unspecified: Secondary | ICD-10-CM | POA: Diagnosis not present

## 2019-05-15 DIAGNOSIS — E1165 Type 2 diabetes mellitus with hyperglycemia: Secondary | ICD-10-CM | POA: Diagnosis not present

## 2019-05-15 DIAGNOSIS — I10 Essential (primary) hypertension: Secondary | ICD-10-CM | POA: Diagnosis not present

## 2019-05-15 DIAGNOSIS — E1169 Type 2 diabetes mellitus with other specified complication: Secondary | ICD-10-CM

## 2019-05-15 MED ORDER — ATORVASTATIN CALCIUM 40 MG PO TABS: 40.0000 mg | ORAL_TABLET | Freq: Every day | ORAL | 1 refills | Status: DC

## 2019-05-15 MED ORDER — OZEMPIC (1 MG/DOSE) 2 MG/1.5ML ~~LOC~~ SOPN: 1.0000 mg | PEN_INJECTOR | SUBCUTANEOUS | 6 refills | Status: DC

## 2019-05-15 MED ORDER — METFORMIN HCL 1000 MG PO TABS: 1000.0000 mg | ORAL_TABLET | Freq: Two times a day (BID) | ORAL | 1 refills | Status: DC

## 2019-05-15 NOTE — Assessment & Plan Note (Signed)
Under good control on current regimen. Continue current regimen. Continue to monitor. Call with any concerns. Refills given. Labs to be drawn next week.   

## 2019-05-15 NOTE — Progress Notes (Signed)
BP 133/76   Pulse 69   Temp (!) 97.4 F (36.3 C) (Oral)   Wt 242 lb (109.8 kg)   LMP  (LMP Unknown)   BMI 47.26 kg/m    Subjective:    Patient ID: Tricia Hill, female    DOB: 08-23-1970, 48 y.o.   MRN: [id number]  HPI: Tricia Hill is a 48 y.o. female  Chief Complaint  Patient presents with  . Diabetes    refill on Ozempic   DIABETES Hypoglycemic episodes:no Polydipsia/polyuria: no Visual disturbance: no Chest pain: no Paresthesias: no Glucose Monitoring: yes  Accucheck frequency: Daily Taking Insulin?: no Blood Pressure Monitoring: a few times a month Retinal Examination: Not up to Date- scheduled for January Foot Exam: Up to Date Diabetic Education: Completed Pneumovax: Up to Date Influenza: Up to Date Aspirin: no  HYPERTENSION / [place] Satisfied with current treatment? yes Duration of hypertension: chronic BP monitoring frequency: not checking BP medication side effects: no Past BP meds: losartan Duration of hyperlipidemia: chronic Cholesterol medication side effects: no Cholesterol supplements: none Past cholesterol medications: atorvastatin Medication compliance: excellent compliance Aspirin: no Recent stressors: yes Recurrent headaches: no Visual changes: no Palpitations: no Dyspnea: no Chest pain: no Lower extremity edema: no Dizzy/lightheaded: no  Relevant past medical, surgical, family and social history reviewed and updated as indicated. Interim medical history since our last visit reviewed. Allergies and medications reviewed and updated.  Review of Systems  Constitutional: Negative.   Respiratory: Negative.   Cardiovascular: Negative.   Musculoskeletal: Negative.   Neurological: Negative.   Psychiatric/Behavioral: Negative.     Per HPI unless specifically indicated above     Objective:    BP 133/76   Pulse 69   Temp (!) 97.4 F (36.3 C) (Oral)   Wt 242 lb (109.8 kg)   LMP  (LMP Unknown)   BMI 47.26 kg/m    Wt Readings from Last 3 Encounters:  05/15/19 242 lb (109.8 kg)  03/13/19 230 lb (104.3 kg)  02/11/19 237 lb (107.5 kg)    Physical Exam Vitals signs and nursing note reviewed.  Constitutional:      General: She is not in acute distress.    Appearance: Normal appearance. She is not ill-appearing, toxic-appearing or diaphoretic.  HENT:     Head: Normocephalic and atraumatic.     Right Ear: External ear normal.     Left Ear: External ear normal.     Nose: Nose normal.     Mouth/Throat:     Mouth: Mucous membranes are moist.     Pharynx: Oropharynx is clear.  Eyes:     General: No scleral icterus.       Right eye: No discharge.        Left eye: No discharge.     Conjunctiva/sclera: Conjunctivae normal.     Pupils: Pupils are equal, round, and reactive to light.  Neck:     Musculoskeletal: Normal range of motion.  Pulmonary:     Effort: Pulmonary effort is normal. No respiratory distress.     Comments: Speaking in full sentences Musculoskeletal: Normal range of motion.  Skin:    Coloration: Skin is not jaundiced or pale.     Findings: No bruising, erythema, lesion or rash.  Neurological:     Mental Status: She is alert and oriented to person, place, and time. Mental status is at baseline.  Psychiatric:        Mood and Affect: Mood normal.  Behavior: Behavior normal.        Thought Content: Thought content normal.        Judgment: Judgment normal.     Results for orders placed or performed in visit on 03/13/19  Microscopic Examination   URINE  Result Value Ref Range   WBC, UA 0-5 0 - 5 /hpf   RBC None seen 0 - 2 /hpf   Epithelial Cells (non renal) 0-10 0 - 10 /hpf   Bacteria, UA Moderate (A) None seen/Few  Urine Culture, Reflex   URINE  Result Value Ref Range   Urine Culture, Routine Final report    Organism ID, Bacteria Comment   Basic metabolic panel  Result Value Ref Range   Glucose 136 (H) 65 - 99 mg/dL   BUN 12 6 - 24 mg/dL   Creatinine, Ser 0.66  0.57 - 1.00 mg/dL   GFR calc non Af Amer 106 >59 mL/min/1.73   GFR calc Af Amer 123 >59 mL/min/1.73   BUN/Creatinine Ratio 18 9 - 23   Sodium 142 134 - 144 mmol/L   Potassium 3.8 3.5 - 5.2 mmol/L   Chloride 101 96 - 106 mmol/L   CO2 25 20 - 29 mmol/L   Calcium 9.6 8.7 - 10.2 mg/dL  UA/M w/rflx Culture, Routine   Specimen: Urine   URINE  Result Value Ref Range   Specific Gravity, UA 1.025 1.005 - 1.030   pH, UA 5.0 5.0 - 7.5   Color, UA Yellow Yellow   Appearance Ur Hazy (A) Clear   Leukocytes,UA 2+ (A) Negative   Protein,UA Negative Negative/Trace   Glucose, UA Negative Negative   Ketones, UA Negative Negative   RBC, UA Negative Negative   Bilirubin, UA Negative Negative   Urobilinogen, Ur 0.2 0.2 - 1.0 mg/dL   Nitrite, UA Negative Negative   Microscopic Examination See below:    Urinalysis Reflex Comment       Assessment & Plan:   Problem List Items Addressed This Visit      Cardiovascular and Mediastinum   HTN (hypertension) - Primary    Under good control on current regimen. Continue current regimen. Continue to monitor. Call with any concerns. Refills given. Labs to be drawn next week.        Relevant Medications   atorvastatin (LIPITOR) 40 MG tablet   Other Relevant Orders   Comp Met (CMET)   Microalbumin, Urine Waived     Endocrine   Uncontrolled diabetes mellitus ([place])    Under good control on current regimen. Continue current regimen. Continue to monitor. Call with any concerns. Refills given. Labs to be drawn next week.        Relevant Medications   Semaglutide, 1 MG/DOSE, (OZEMPIC, 1 MG/DOSE,) 2 MG/1.5ML SOPN   metFORMIN (GLUCOPHAGE) 1000 MG tablet   atorvastatin (LIPITOR) 40 MG tablet   Other Relevant Orders   Bayer DCA Hb A1c Waived   Comp Met (CMET)   Microalbumin, Urine Waived   Hyperlipidemia associated with type 2 diabetes mellitus ([place])    Under good control on current regimen. Continue current regimen. Continue to monitor. Call with any  concerns. Refills given. Labs to be drawn next week.        Relevant Medications   Semaglutide, 1 MG/DOSE, (OZEMPIC, 1 MG/DOSE,) 2 MG/1.5ML SOPN   metFORMIN (GLUCOPHAGE) 1000 MG tablet   atorvastatin (LIPITOR) 40 MG tablet   Other Relevant Orders   Comp Met (CMET)   Lipid Panel w/o Chol/HDL Ratio OUT  Follow up plan: Return in about 3 months (around 08/13/2019).    . This visit was completed via D[employer] due to the restrictions of the COVID-19 pandemic. All issues as above were discussed and addressed. Physical exam was done as above through visual confirmation on Doximity. If it was felt that the patient should be evaluated in the office, they were directed there. The patient verbally consented to this visit. . Location of the patient: home . Location of the provider: work . Those involved with this call:  . Provider: Park Liter, DO . CMA: Tiffany Reel, CMA . Front Desk/Registration: Don Perking  . Time spent on call: 25 minutes with patient face to face via video conference. More than 50% of this time was spent in counseling and coordination of care. 40 minutes total spent in review of patient's record and preparation of their chart.

## 2019-05-18 ENCOUNTER — Other Ambulatory Visit: Payer: 59

## 2019-05-18 ENCOUNTER — Other Ambulatory Visit: Payer: Self-pay

## 2019-05-18 DIAGNOSIS — E785 Hyperlipidemia, unspecified: Secondary | ICD-10-CM | POA: Diagnosis not present

## 2019-05-18 DIAGNOSIS — E1165 Type 2 diabetes mellitus with hyperglycemia: Secondary | ICD-10-CM

## 2019-05-18 DIAGNOSIS — E1169 Type 2 diabetes mellitus with other specified complication: Secondary | ICD-10-CM

## 2019-05-18 DIAGNOSIS — I10 Essential (primary) hypertension: Secondary | ICD-10-CM | POA: Diagnosis not present

## 2019-05-18 LAB — MICROALBUMIN, URINE WAIVED
Creatinine, Urine Waived: 300 mg/dL (ref 10–300)
Microalb, Ur Waived: 30 mg/L — ABNORMAL HIGH (ref 0–19)
Microalb/Creat Ratio: <30 mg/g (ref <30)

## 2019-05-18 LAB — BAYER DCA HB A1C WAIVED: HB A1C (BAYER DCA - WAIVED): 8.2 % — ABNORMAL HIGH (ref <7.0)

## 2019-05-19 LAB — LIPID PANEL W/O CHOL/HDL RATIO
Cholesterol, Total: 149 mg/dL (ref 100–199)
HDL: 39 mg/dL — ABNORMAL LOW (ref >39)
LDL Chol Calc (NIH): 74 mg/dL (ref 0–99)
Triglycerides: 218 mg/dL — ABNORMAL HIGH (ref 0–149)
VLDL Cholesterol Cal: 36 mg/dL (ref 5–40)

## 2019-05-19 LAB — COMPREHENSIVE METABOLIC PANEL
ALT: 19 IU/L (ref 0–32)
AST: 11 IU/L (ref 0–40)
Albumin/Globulin Ratio: 2.2 (ref 1.2–2.2)
Albumin: 4.4 g/dL (ref 3.8–4.8)
Alkaline Phosphatase: 130 IU/L — ABNORMAL HIGH (ref 39–117)
BUN/Creatinine Ratio: 22 (ref 9–23)
BUN: 15 mg/dL (ref 6–24)
Bilirubin Total: 0.3 mg/dL (ref 0.0–1.2)
CO2: 22 mmol/L (ref 20–29)
Calcium: 9.6 mg/dL (ref 8.7–10.2)
Chloride: 101 mmol/L (ref 96–106)
Creatinine, Ser: 0.69 mg/dL (ref 0.57–1.00)
GFR calc Af Amer: 121 mL/min/{1.73_m2} (ref >59)
GFR calc non Af Amer: 105 mL/min/{1.73_m2} (ref >59)
Globulin, Total: 2 g/dL (ref 1.5–4.5)
Glucose: 214 mg/dL — ABNORMAL HIGH (ref 65–99)
Potassium: 4.2 mmol/L (ref 3.5–5.2)
Sodium: 140 mmol/L (ref 134–144)
Total Protein: 6.4 g/dL (ref 6.0–8.5)

## 2019-05-22 DIAGNOSIS — G4733 Obstructive sleep apnea (adult) (pediatric): Secondary | ICD-10-CM | POA: Diagnosis not present

## 2019-06-22 DIAGNOSIS — G4733 Obstructive sleep apnea (adult) (pediatric): Secondary | ICD-10-CM | POA: Diagnosis not present

## 2019-07-06 LAB — HM DIABETES EYE EXAM

## 2019-07-23 DIAGNOSIS — G4733 Obstructive sleep apnea (adult) (pediatric): Secondary | ICD-10-CM | POA: Diagnosis not present

## 2019-08-04 DIAGNOSIS — G4733 Obstructive sleep apnea (adult) (pediatric): Secondary | ICD-10-CM | POA: Diagnosis not present

## 2019-08-11 ENCOUNTER — Encounter: Payer: Self-pay | Admitting: Family Medicine

## 2019-08-11 ENCOUNTER — Ambulatory Visit (INDEPENDENT_AMBULATORY_CARE_PROVIDER_SITE_OTHER): Payer: 59 | Admitting: Family Medicine

## 2019-08-11 VITALS — BP 144/83 | HR 62 | Temp 96.7°F | Ht 60.0 in | Wt 244.0 lb

## 2019-08-11 DIAGNOSIS — E785 Hyperlipidemia, unspecified: Secondary | ICD-10-CM

## 2019-08-11 DIAGNOSIS — I10 Essential (primary) hypertension: Secondary | ICD-10-CM | POA: Diagnosis not present

## 2019-08-11 DIAGNOSIS — E1165 Type 2 diabetes mellitus with hyperglycemia: Secondary | ICD-10-CM | POA: Diagnosis not present

## 2019-08-11 DIAGNOSIS — E1169 Type 2 diabetes mellitus with other specified complication: Secondary | ICD-10-CM | POA: Diagnosis not present

## 2019-08-11 LAB — BAYER DCA HB A1C WAIVED: HB A1C (BAYER DCA - WAIVED): 8.6 % — ABNORMAL HIGH (ref <7.0)

## 2019-08-11 MED ORDER — OZEMPIC (0.25 OR 0.5 MG/DOSE) 2 MG/1.5ML ~~LOC~~ SOPN: 0.5000 mg | PEN_INJECTOR | SUBCUTANEOUS | 1 refills | Status: DC

## 2019-08-11 NOTE — Assessment & Plan Note (Signed)
Running a little high today. Will restart her ozempic and recheck 3 months. Call with any concerns. Continue to monitor.

## 2019-08-11 NOTE — Assessment & Plan Note (Signed)
Under good control on current regimen. Continue current regimen. Continue to monitor. Call with any concerns.   

## 2019-08-11 NOTE — Assessment & Plan Note (Signed)
Will restart her ozempic. We expect her A1c to be high today- we will check it. Recheck 3 months. Watch diet. Call with any concerns.

## 2019-08-11 NOTE — Progress Notes (Signed)
BP (!) 144/83   Pulse 62   Temp (!) 96.7 F (35.9 C) (Oral)   Ht 5' (1.524 m)   Wt 244 lb (110.7 kg)   LMP  (LMP Unknown)   BMI 47.65 kg/m    Subjective:    Patient ID: Tricia Hill, female    DOB: 1970/10/02, 49 y.o.   MRN: [id number]  HPI: Tricia Hill is a 49 y.o. female  Chief Complaint  Patient presents with  . Diabetes  . Hypertension  . Hyperlipidemia   Has not been taking her medicine as she should- ozempic is making her feel nauseous, so she has stopped it all together. Has been under a lot of stress at home with her son and husband being sick and her daughter going through therapy and having some issues. She is feeling overwhelmed and frustrated.  HYPERTENSION / HYPERLIPIDEMIA Satisfied with current treatment? yes Duration of hypertension: chronic BP monitoring frequency: a few times a month BP medication side effects: no Past BP meds: losartan Duration of hyperlipidemia: chronic Cholesterol medication side effects: no Cholesterol supplements: none Past cholesterol medications: atorvastatin Medication compliance: fair compliance Aspirin: no Recent stressors: no Recurrent headaches: no Visual changes: no Palpitations: no Dyspnea: no Chest pain: no Lower extremity edema: no Dizzy/lightheaded: no  DIABETES Hypoglycemic episodes:no Polydipsia/polyuria: yes Visual disturbance: no Chest pain: no Paresthesias: no Glucose Monitoring: no  Accucheck frequency: Not Checking Taking Insulin?: no Blood Pressure Monitoring: not checking Retinal Examination: Up to Date Foot Exam: Up to Date Diabetic Education: Completed Pneumovax: Up to Date Influenza: Up to Date Aspirin: no   Relevant past medical, surgical, family and social history reviewed and updated as indicated. Interim medical history since our last visit reviewed. Allergies and medications reviewed and updated.  Review of Systems  Constitutional: Negative.   Respiratory: Negative.    Cardiovascular: Negative.   Musculoskeletal: Negative.   Skin: Negative.   Psychiatric/Behavioral: Positive for dysphoric mood. Negative for agitation, behavioral problems, confusion, decreased concentration, hallucinations, self-injury, sleep disturbance and suicidal ideas. The patient is nervous/anxious. The patient is not hyperactive.     Per HPI unless specifically indicated above     Objective:    BP (!) 144/83   Pulse 62   Temp (!) 96.7 F (35.9 C) (Oral)   Ht 5' (1.524 m)   Wt 244 lb (110.7 kg)   LMP  (LMP Unknown)   BMI 47.65 kg/m   Wt Readings from Last 3 Encounters:  08/11/19 244 lb (110.7 kg)  05/15/19 242 lb (109.8 kg)  03/13/19 230 lb (104.3 kg)    Physical Exam Vitals and nursing note reviewed.  Constitutional:      General: She is not in acute distress.    Appearance: Normal appearance. She is not ill-appearing, toxic-appearing or diaphoretic.  HENT:     Head: Normocephalic and atraumatic.     Right Ear: External ear normal.     Left Ear: External ear normal.     Nose: Nose normal.     Mouth/Throat:     Mouth: Mucous membranes are moist.     Pharynx: Oropharynx is clear.  Eyes:     General: No scleral icterus.       Right eye: No discharge.        Left eye: No discharge.     Conjunctiva/sclera: Conjunctivae normal.     Pupils: Pupils are equal, round, and reactive to light.  Pulmonary:     Effort: Pulmonary effort is normal. No respiratory  distress.     Comments: Speaking in full sentences Musculoskeletal:        General: Normal range of motion.     Cervical back: Normal range of motion.  Skin:    Coloration: Skin is not jaundiced or pale.     Findings: No bruising, erythema, lesion or rash.  Neurological:     Mental Status: She is alert and oriented to person, place, and time. Mental status is at baseline.  Psychiatric:        Mood and Affect: Mood normal.        Behavior: Behavior normal.        Thought Content: Thought content normal.         Judgment: Judgment normal.     Results for orders placed or performed in visit on 07/07/19  HM DIABETES EYE EXAM  Result Value Ref Range   HM Diabetic Eye Exam No Retinopathy No Retinopathy      Assessment & Plan:   Problem List Items Addressed This Visit      Cardiovascular and Mediastinum   HTN (hypertension) - Primary    Running a little high today. Will restart her ozempic and recheck 3 months. Call with any concerns. Continue to monitor.         Endocrine   Uncontrolled diabetes mellitus ([place])    Will restart her ozempic. We expect her A1c to be high today- we will check it. Recheck 3 months. Watch diet. Call with any concerns.       Relevant Medications   Semaglutide,0.25 or 0.5MG /DOS, (OZEMPIC, 0.25 OR 0.5 MG/DOSE,) 2 MG/1.5ML SOPN   Other Relevant Orders   Bayer DCA Hb A1c Waived   Hyperlipidemia associated with type 2 diabetes mellitus ([place])    Under good control on current regimen. Continue current regimen. Continue to monitor. Call with any concerns.        Relevant Medications   Semaglutide,0.25 or 0.5MG /DOS, (OZEMPIC, 0.25 OR 0.5 MG/DOSE,) 2 MG/1.5ML SOPN     Other   Morbid obesity ([place])    Discussed diet and exercise. Goal of losing 1-2lbs per week. Call with any concerns.       Relevant Medications   Semaglutide,0.25 or 0.5MG /DOS, (OZEMPIC, 0.25 OR 0.5 MG/DOSE,) 2 MG/1.5ML SOPN       Follow up plan: Return in about 3 months (around 11/11/2019).   . This visit was completed via MyChart due to the restrictions of the COVID-19 pandemic. All issues as above were discussed and addressed. Physical exam was done as above through visual confirmation on MyChart. If it was felt that the patient should be evaluated in the office, they were directed there. The patient verbally consented to this visit. . Location of the patient: parking lot . Location of the provider: work . Those involved with this call:  . Provider: Park Liter, DO . CMA: Lesle Chris, [place]  . Front Desk/Registration: Don Perking  . Time spent on call: 25 minutes with patient face to face via video conference. More than 50% of this time was spent in counseling and coordination of care. 40 minutes total spent in review of patient's record and preparation of their chart.

## 2019-08-11 NOTE — Assessment & Plan Note (Signed)
Discussed diet and exercise. Goal of losing 1-2lbs per week. Call with any concerns.

## 2019-11-20 ENCOUNTER — Encounter: Payer: Self-pay | Admitting: Family Medicine

## 2019-11-20 ENCOUNTER — Ambulatory Visit (INDEPENDENT_AMBULATORY_CARE_PROVIDER_SITE_OTHER): Payer: 59 | Admitting: Family Medicine

## 2019-11-20 ENCOUNTER — Other Ambulatory Visit: Payer: Self-pay

## 2019-11-20 VITALS — BP 114/73 | HR 81 | Temp 98.5°F | Ht 59.57 in | Wt 228.0 lb

## 2019-11-20 DIAGNOSIS — Z0000 Encounter for general adult medical examination without abnormal findings: Secondary | ICD-10-CM

## 2019-11-20 DIAGNOSIS — E1169 Type 2 diabetes mellitus with other specified complication: Secondary | ICD-10-CM

## 2019-11-20 DIAGNOSIS — N939 Abnormal uterine and vaginal bleeding, unspecified: Secondary | ICD-10-CM | POA: Diagnosis not present

## 2019-11-20 DIAGNOSIS — Z1159 Encounter for screening for other viral diseases: Secondary | ICD-10-CM | POA: Diagnosis not present

## 2019-11-20 DIAGNOSIS — E785 Hyperlipidemia, unspecified: Secondary | ICD-10-CM | POA: Diagnosis not present

## 2019-11-20 DIAGNOSIS — Z1231 Encounter for screening mammogram for malignant neoplasm of breast: Secondary | ICD-10-CM

## 2019-11-20 DIAGNOSIS — E1165 Type 2 diabetes mellitus with hyperglycemia: Secondary | ICD-10-CM | POA: Diagnosis not present

## 2019-11-20 DIAGNOSIS — I10 Essential (primary) hypertension: Secondary | ICD-10-CM | POA: Diagnosis not present

## 2019-11-20 DIAGNOSIS — R310 Gross hematuria: Secondary | ICD-10-CM | POA: Diagnosis not present

## 2019-11-20 LAB — URINALYSIS, ROUTINE W REFLEX MICROSCOPIC
Bilirubin, UA: NEGATIVE
Glucose, UA: NEGATIVE
Ketones, UA: NEGATIVE
Nitrite, UA: NEGATIVE
Protein,UA: NEGATIVE
Specific Gravity, UA: 1.025 (ref 1.005–1.030)
Urobilinogen, Ur: 0.2 mg/dL (ref 0.2–1.0)
pH, UA: 6 (ref 5.0–7.5)

## 2019-11-20 LAB — MICROSCOPIC EXAMINATION

## 2019-11-20 LAB — BAYER DCA HB A1C WAIVED: HB A1C (BAYER DCA - WAIVED): 7.8 % — ABNORMAL HIGH (ref <7.0)

## 2019-11-20 LAB — MICROALBUMIN, URINE WAIVED
Creatinine, Urine Waived: 200 mg/dL (ref 10–300)
Microalb, Ur Waived: 80 mg/L — ABNORMAL HIGH (ref 0–19)

## 2019-11-20 MED ORDER — ATORVASTATIN CALCIUM 40 MG PO TABS: 40.0000 mg | ORAL_TABLET | ORAL | 0 refills | Status: DC

## 2019-11-20 MED ORDER — LOSARTAN POTASSIUM 25 MG PO TABS: 12.5000 mg | ORAL_TABLET | Freq: Every day | ORAL | 1 refills | Status: DC

## 2019-11-20 MED ORDER — NITROFURANTOIN MONOHYD MACRO 100 MG PO CAPS: 100.0000 mg | ORAL_CAPSULE | Freq: Two times a day (BID) | ORAL | 0 refills | Status: DC

## 2019-11-20 NOTE — Patient Instructions (Addendum)
Call to schedule your mammogram  [facility]  Address: [address], [place], [place] [address]  Phone: [phone]   Health Maintenance, Female Adopting a healthy lifestyle and getting preventive care are important in promoting health and wellness. Ask your health care provider about:  The right schedule for you to have regular tests and exams.  Things you can do on your own to prevent diseases and keep yourself healthy. What should I know about diet, weight, and exercise? Eat a healthy diet   Eat a diet that includes plenty of vegetables, fruits, low-fat dairy products, and lean protein.  Do not eat a lot of foods that are high in solid fats, added sugars, or sodium. Maintain a healthy weight Body mass index (BMI) is used to identify weight problems. It estimates body fat based on height and weight. Your health care provider can help determine your BMI and help you achieve or maintain a healthy weight. Get regular exercise Get regular exercise. This is one of the most important things you can do for your health. Most adults should:  Exercise for at least 150 minutes each week. The exercise should increase your heart rate and make you sweat (moderate-intensity exercise).  Do strengthening exercises at least twice a week. This is in addition to the moderate-intensity exercise.  Spend less time sitting. Even light physical activity can be beneficial. Watch cholesterol and blood lipids Have your blood tested for lipids and cholesterol at 48 years of age, then have this test every 5 years. Have your cholesterol levels checked more often if:  Your lipid or cholesterol levels are high.  You are older than 48 years of age.  You are at high risk for heart disease. What should I know about cancer screening? Depending on your health history and family history, you may need to have cancer screening at various ages. This may include screening  for:  Breast cancer.  Cervical cancer.  Colorectal cancer.  Skin cancer.  Lung cancer. What should I know about heart disease, diabetes, and high blood pressure? Blood pressure and heart disease  High blood pressure causes heart disease and increases the risk of stroke. This is more likely to develop in people who have high blood pressure readings, are of African descent, or are overweight.  Have your blood pressure checked: ? Every 3-5 years if you are 18-39 years of age. ? Every year if you are 40 years old or older. Diabetes Have regular diabetes screenings. This checks your fasting blood sugar level. Have the screening done:  Once every three years after age 40 if you are at a normal weight and have a low risk for diabetes.  More often and at a younger age if you are overweight or have a high risk for diabetes. What should I know about preventing infection? Hepatitis B If you have a higher risk for hepatitis B, you should be screened for this virus. Talk with your health care provider to find out if you are at risk for hepatitis B infection. Hepatitis C Testing is recommended for:  Everyone born from 1945 through 1965.  Anyone with known risk factors for hepatitis C. Sexually transmitted infections (STIs)  Get screened for STIs, including gonorrhea and chlamydia, if: ? You are sexually active and are younger than 48 years of age. ? You are older than 48 years of age and your health care provider tells you that you are at risk for this type of   infection. ? Your sexual activity has changed since you were last screened, and you are at increased risk for chlamydia or gonorrhea. Ask your health care provider if you are at risk.  Ask your health care provider about whether you are at high risk for HIV. Your health care provider may recommend a prescription medicine to help prevent HIV infection. If you choose to take medicine to prevent HIV, you should first get tested for HIV.  You should then be tested every 3 months for as long as you are taking the medicine. Pregnancy  If you are about to stop having your period (premenopausal) and you may become pregnant, seek counseling before you get pregnant.  Take 400 to 800 micrograms (mcg) of folic acid every day if you become pregnant.  Ask for birth control (contraception) if you want to prevent pregnancy. Osteoporosis and menopause Osteoporosis is a disease in which the bones lose minerals and strength with aging. This can result in bone fractures. If you are 65 years old or older, or if you are at risk for osteoporosis and fractures, ask your health care provider if you should:  Be screened for bone loss.  Take a calcium or vitamin D supplement to lower your risk of fractures.  Be given hormone replacement therapy (HRT) to treat symptoms of menopause. Follow these instructions at home: Lifestyle  Do not use any products that contain nicotine or tobacco, such as cigarettes, e-cigarettes, and chewing tobacco. If you need help quitting, ask your health care provider.  Do not use street drugs.  Do not share needles.  Ask your health care provider for help if you need support or information about quitting drugs. Alcohol use  Do not drink alcohol if: ? Your health care provider tells you not to drink. ? You are pregnant, may be pregnant, or are planning to become pregnant.  If you drink alcohol: ? Limit how much you use to 0-1 drink a day. ? Limit intake if you are breastfeeding.  Be aware of how much alcohol is in your drink. In the [place] one drink equals one 12 oz bottle of beer (355 mL), one 5 oz glass of wine (148 mL), or one 1 oz glass of hard liquor (44 mL). General instructions  Schedule regular health, dental, and eye exams.  Stay current with your vaccines.  Tell your health care provider if: ? You often feel depressed. ? You have ever been abused or do not feel safe at  home. Summary  Adopting a healthy lifestyle and getting preventive care are important in promoting health and wellness.  Follow your health care provider's instructions about healthy diet, exercising, and getting tested or screened for diseases.  Follow your health care provider's instructions on monitoring your cholesterol and blood pressure. This information is not intended to replace advice given to you by your health care provider. Make sure you discuss any questions you have with your health care provider. Document Revised: 05/21/2018 Document Reviewed: 05/21/2018 Elsevier Patient Education  2020 [employer]  

## 2019-11-20 NOTE — Progress Notes (Signed)
BP 114/73   Pulse 81   Temp 98.5 F (36.9 C)   Ht 4' 11.57" (1.513 m)   Wt 228 lb (103.4 kg)   LMP  (LMP Unknown)   SpO2 98%   BMI 45.18 kg/m    Subjective:    Patient ID: Tricia Hill, female    DOB: 21-May-1971, 49 y.o.   MRN: [id number]  HPI: Tricia Hill is a 49 y.o. female presenting on 11/20/2019 for comprehensive medical examination. Current medical complaints include:  Has really been working on her diet and exercise. Has been feeling really good! Has not been taking any of her medicines. Has been doing optivia.   DIABETES Hypoglycemic episodes:no Polydipsia/polyuria: no Visual disturbance: no Chest pain: no Paresthesias: no Glucose Monitoring: no  Accucheck frequency: Not Checking Taking Insulin?: no Blood Pressure Monitoring: not checking Retinal Examination: Up to Date Foot Exam: done today Diabetic Education: Completed Pneumovax: Up to Date Influenza: Up to Date Aspirin: no  HYPERTENSION / HYPERLIPIDEMIA Satisfied with current treatment? yes Duration of hypertension: chronic BP monitoring frequency: not checking BP medication side effects: no Past BP meds: losartan Duration of hyperlipidemia: chronic Cholesterol medication side effects: no Cholesterol supplements: none Past cholesterol medications: atorvastatin (hasn't been taking it) Medication compliance: poor compliance Aspirin: no Recent stressors: yes Recurrent headaches: no Visual changes: no Palpitations: no Dyspnea: no Chest pain: no Lower extremity edema: no Dizzy/lightheaded: no  She currently lives with: husband and kids Menopausal Symptoms: no  Depression Screen done today and results listed below:  Depression screen [facility] 2/9 03/13/2019 04/16/2018 02/28/2017 11/08/2015 02/08/2015  Decreased Interest 0 0 0 0 0  Down, Depressed, Hopeless 0 0 0 0 0  PHQ - 2 Score 0 0 0 0 0  Altered sleeping 1 0 - - -  Tired, decreased energy 0 3 - - -  Change in appetite 0 0 - - -  Feeling  bad or failure about yourself  0 0 - - -  Trouble concentrating 0 0 - - -  Moving slowly or fidgety/restless 0 0 - - -  Suicidal thoughts 0 0 - - -  PHQ-9 Score 1 3 - - -  Difficult doing work/chores Not difficult at all Not difficult at all - - -    Past Medical History:  Past Medical History:  Diagnosis Date  . Asthma   . Diabetes mellitus without complication ([place]) 9833  . GERD (gastroesophageal reflux disease)   . Heart murmur    child  . Hypertension   . Shingles   . Sleep apnea    USE CPAP    Surgical History:  Past Surgical History:  Procedure Laterality Date  . ABDOMINAL HYSTERECTOMY     Partial  . CESAREAN SECTION     X 2  . CHOLECYSTECTOMY N/A 01/18/2017   Procedure: LAPAROSCOPIC CHOLECYSTECTOMY WITH INTRAOPERATIVE CHOLANGIOGRAM;  Surgeon: Robert Bellow, MD;  Location: [facility] ORS;  Service: General;  Laterality: N/A;  . DIAGNOSTIC LAPAROSCOPY  2006   EXCISION OF ABDOMINAL WALL ENDOMETRIOMA  . SPINAL FUSION  2012   C3 and C4    Medications:  Current Outpatient Medications on File Prior to Visit  Medication Sig  . glucose blood (FREESTYLE LITE) test strip USE AS DIRECTED  . glucose monitoring kit (FREESTYLE) monitoring kit 1 each by Does not apply route as needed for other.  . Lancets (FREESTYLE) lancets 1 each by Other route See admin instructions. Use as instructed   No current facility-administered medications on  file prior to visit.    Allergies:  Allergies  Allergen Reactions  . Lisinopril Cough  . Ultram [Tramadol Hcl] Nausea And Vomiting    Severe vomiting   . Victoza [Liraglutide] Nausea And Vomiting    Social History:  Social History   Socioeconomic History  . Marital status: Married    Spouse name: Not on file  . Number of children: Not on file  . Years of education: Not on file  . Highest education level: Not on file  Occupational History  . Not on file  Tobacco Use  . Smoking status: Never Smoker  . Smokeless tobacco: Never  Used  Vaping Use  . Vaping Use: Never used  Substance and Sexual Activity  . Alcohol use: Yes    Comment: on occasion  . Drug use: No  . Sexual activity: Never    Birth control/protection: None, Surgical  Other Topics Concern  . Not on file  Social History Narrative  . Not on file   Social Determinants of Health   Financial Resource Strain:   . Difficulty of Paying Living Expenses:   Food Insecurity:   . Worried About Charity fundraiser in the Last Year:   . Arboriculturist in the Last Year:   Transportation Needs:   . Film/video editor (Medical):   Marland Kitchen Lack of Transportation (Non-Medical):   Physical Activity:   . Days of Exercise per Week:   . Minutes of Exercise per Session:   Stress:   . Feeling of Stress :   Social Connections:   . Frequency of Communication with Friends and Family:   . Frequency of Social Gatherings with Friends and Family:   . Attends Religious Services:   . Active Member of Clubs or Organizations:   . Attends Archivist Meetings:   Marland Kitchen Marital Status:   Intimate Partner Violence:   . Fear of Current or Ex-Partner:   . Emotionally Abused:   Marland Kitchen Physically Abused:   . Sexually Abused:    Social History   Tobacco Use  Smoking Status Never Smoker  Smokeless Tobacco Never Used   Social History   Substance and Sexual Activity  Alcohol Use Yes   Comment: on occasion    Family History:  Family History  Problem Relation Age of Onset  . Hypertension Mother   . Diabetes Mother   . Hypertension Father   . Graves' disease Sister   . Clotting disorder Brother   . Heart disease Maternal Grandmother   . Heart disease Maternal Grandfather   . Kidney disease Neg Hx   . Breast cancer Neg Hx     Past medical history, surgical history, medications, allergies, family history and social history reviewed with patient today and changes made to appropriate areas of the chart.   Review of Systems  Constitutional: Negative.   HENT:  Positive for tinnitus. Negative for congestion, ear discharge, ear pain, hearing loss, nosebleeds, sinus pain and sore throat.   Eyes: Positive for blurred vision. Negative for double vision, photophobia, pain, discharge and redness.  Respiratory: Negative.  Negative for stridor.   Cardiovascular: Positive for leg swelling. Negative for chest pain, palpitations, orthopnea, claudication and PND.  Gastrointestinal: Negative.   Genitourinary: Positive for hematuria. Negative for dysuria, flank pain, frequency and urgency.  Musculoskeletal: Negative.   Skin: Negative.   Neurological: Negative.   Endo/Heme/Allergies: Positive for environmental allergies. Negative for polydipsia. Does not bruise/bleed easily.  Psychiatric/Behavioral: Negative.  All other ROS negative except what is listed above and in the HPI.      Objective:    BP 114/73   Pulse 81   Temp 98.5 F (36.9 C)   Ht 4' 11.57" (1.513 m)   Wt 228 lb (103.4 kg)   LMP  (LMP Unknown)   SpO2 98%   BMI 45.18 kg/m   Wt Readings from Last 3 Encounters:  11/20/19 228 lb (103.4 kg)  08/11/19 244 lb (110.7 kg)  05/15/19 242 lb (109.8 kg)    Physical Exam Vitals and nursing note reviewed.  Constitutional:      General: She is not in acute distress.    Appearance: Normal appearance. She is not ill-appearing, toxic-appearing or diaphoretic.  HENT:     Head: Normocephalic and atraumatic.     Right Ear: Tympanic membrane, ear canal and external ear normal. There is no impacted cerumen.     Left Ear: Tympanic membrane, ear canal and external ear normal. There is no impacted cerumen.     Nose: Nose normal. No congestion or rhinorrhea.     Mouth/Throat:     Mouth: Mucous membranes are moist.     Pharynx: Oropharynx is clear. No oropharyngeal exudate or posterior oropharyngeal erythema.  Eyes:     General: No scleral icterus.       Right eye: No discharge.        Left eye: No discharge.     Extraocular Movements: Extraocular  movements intact.     Conjunctiva/sclera: Conjunctivae normal.     Pupils: Pupils are equal, round, and reactive to light.  Neck:     Vascular: No carotid bruit.  Cardiovascular:     Rate and Rhythm: Normal rate and regular rhythm.     Pulses: Normal pulses.     Heart sounds: No murmur heard.  No friction rub. No gallop.   Pulmonary:     Effort: Pulmonary effort is normal. No respiratory distress.     Breath sounds: Normal breath sounds. No stridor. No wheezing, rhonchi or rales.  Chest:     Chest wall: No tenderness.  Abdominal:     General: Abdomen is flat. Bowel sounds are normal. There is no distension.     Palpations: Abdomen is soft. There is no mass.     Tenderness: There is no abdominal tenderness. There is no right CVA tenderness, left CVA tenderness, guarding or rebound.     Hernia: No hernia is present.  Genitourinary:    Comments: Breast and pelvic exams deferred with shared decision making Musculoskeletal:        General: No swelling, tenderness, deformity or signs of injury.     Cervical back: Normal range of motion and neck supple. No rigidity. No muscular tenderness.     Right lower leg: No edema.     Left lower leg: No edema.  Lymphadenopathy:     Cervical: No cervical adenopathy.  Skin:    General: Skin is warm and dry.     Capillary Refill: Capillary refill takes less than 2 seconds.     Coloration: Skin is not jaundiced or pale.     Findings: No bruising, erythema, lesion or rash.  Neurological:     General: No focal deficit present.     Mental Status: She is alert and oriented to person, place, and time. Mental status is at baseline.     Cranial Nerves: No cranial nerve deficit.     Sensory: No sensory deficit.  Motor: No weakness.     Coordination: Coordination normal.     Gait: Gait normal.     Deep Tendon Reflexes: Reflexes normal.  Psychiatric:        Mood and Affect: Mood normal.        Behavior: Behavior normal.        Thought Content:  Thought content normal.        Judgment: Judgment normal.     Results for orders placed or performed in visit on 11/20/19  Microscopic Examination   Urine  Result Value Ref Range   WBC, UA 6-10 (A) 0 - 5 /hpf   RBC 0-2 0 - 2 /hpf   Epithelial Cells (non renal) 0-10 0 - 10 /hpf   Bacteria, UA Many (A) None seen/Few  Bayer DCA Hb A1c Waived  Result Value Ref Range   HB A1C (BAYER DCA - WAIVED) 7.8 (H) <7.0 %  Microalbumin, Urine Waived  Result Value Ref Range   Microalb, Ur Waived 80 (H) 0 - 19 mg/L   Creatinine, Urine Waived 200 10 - 300 mg/dL   Microalb/Creat Ratio 30-300 (H) <30 mg/g  Urinalysis, Routine w reflex microscopic  Result Value Ref Range   Specific Gravity, UA 1.025 1.005 - 1.030   pH, UA 6.0 5.0 - 7.5   Color, UA Yellow Yellow   Appearance Ur Cloudy (A) Clear   Leukocytes,UA 1+ (A) Negative   Protein,UA Negative Negative/Trace   Glucose, UA Negative Negative   Ketones, UA Negative Negative   RBC, UA 3+ (A) Negative   Bilirubin, UA Negative Negative   Urobilinogen, Ur 0.2 0.2 - 1.0 mg/dL   Nitrite, UA Negative Negative   Microscopic Examination See below:       Assessment & Plan:   Problem List Items Addressed This Visit      Cardiovascular and Mediastinum   HTN (hypertension)    Doing great. Will decrease losartan to 12.5 due to weight loss. Continue diet and exercise. Continue to monitor. Call with any concerns.       Relevant Medications   atorvastatin (LIPITOR) 40 MG tablet   losartan (COZAAR) 25 MG tablet   Other Relevant Orders   CBC with Differential/Platelet   Comprehensive metabolic panel   Microalbumin, Urine Waived (Completed)   TSH     Endocrine   Uncontrolled diabetes mellitus ([place])    Doing MUCH better with A1c of 7.8 off medication. Continue off medication. Continue diet and exercise. Continue to monitor. Call with any concerns.       Relevant Medications   atorvastatin (LIPITOR) 40 MG tablet   losartan (COZAAR) 25 MG tablet    Other Relevant Orders   Bayer DCA Hb A1c Waived (Completed)   CBC with Differential/Platelet   Comprehensive metabolic panel   Microalbumin, Urine Waived (Completed)   Urinalysis, Routine w reflex microscopic (Completed)   Hyperlipidemia associated with type 2 diabetes mellitus ([place])    Does not want to take medicine. Will cut down to 1 pill weekly. Checking labs today. Await results. Call with any concerns.      Relevant Medications   atorvastatin (LIPITOR) 40 MG tablet   losartan (COZAAR) 25 MG tablet   Other Relevant Orders   CBC with Differential/Platelet   Comprehensive metabolic panel   Lipid Panel w/o Chol/HDL Ratio     Other   Morbid obesity ([place])    Congratulated patient on her 14lb weight loss! Continue to work on diet and exercise. Continue to monitor. Call  with any concerns.       Relevant Orders   CBC with Differential/Platelet   Comprehensive metabolic panel    Other Visit Diagnoses    Routine general medical examination at a health care facility    -  Primary   Vaccines up to date. Screening labs checked today. Pap up to date. Mammogram ordered today. Continue diet and exercise. Call with any concerns.    Relevant Orders   CBC with Differential/Platelet   Comprehensive metabolic panel   Encounter for hepatitis C screening test for low risk patient       Labs drawn today. Await results.    Relevant Orders   Hepatitis C antibody   CBC with Differential/Platelet   Comprehensive metabolic panel   Encounter for screening mammogram for malignant neoplasm of breast       Mammogram ordered today. Await results. Call with any concerns.    Relevant Orders   MM 3D SCREEN BREAST BILATERAL   Vaginal bleeding       s/p hysterectomy. Will get her into OBGYN. Call with any concerns.    Relevant Orders   Ambulatory referral to Obstetrics / Gynecology       Follow up plan: Return in about 3 months (around 02/20/2020).   LABORATORY TESTING:  - Pap smear: up to  date  IMMUNIZATIONS:   - Tdap: Tetanus vaccination status reviewed: last tetanus booster within 10 years. - Influenza: Up to date - Pneumovax: Up to date  SCREENING: -Mammogram: Up to date  - Colonoscopy: Not applicable   PATIENT COUNSELING:   Advised to take 1 mg of folate supplement per day if capable of pregnancy.   Sexuality: Discussed sexually transmitted diseases, partner selection, use of condoms, avoidance of unintended pregnancy  and contraceptive alternatives.   Advised to avoid cigarette smoking.  I discussed with the patient that most people either abstain from alcohol or drink within safe limits (<=14/week and <=4 drinks/occasion for males, <=7/weeks and <= 3 drinks/occasion for females) and that the risk for alcohol disorders and other health effects rises proportionally with the number of drinks per week and how often a drinker exceeds daily limits.  Discussed cessation/primary prevention of drug use and availability of treatment for abuse.   Diet: Encouraged to adjust caloric intake to maintain  or achieve ideal body weight, to reduce intake of dietary saturated fat and total fat, to limit sodium intake by avoiding high sodium foods and not adding table salt, and to maintain adequate dietary potassium and calcium preferably from fresh fruits, vegetables, and low-fat dairy products.    stressed the importance of regular exercise  Injury prevention: Discussed safety belts, safety helmets, smoke detector, smoking near bedding or upholstery.   Dental health: Discussed importance of regular tooth brushing, flossing, and dental visits.    NEXT PREVENTATIVE PHYSICAL DUE IN 1 YEAR. Return in about 3 months (around 02/20/2020).

## 2019-11-20 NOTE — Assessment & Plan Note (Signed)
Doing MUCH better with A1c of 7.8 off medication. Continue off medication. Continue diet and exercise. Continue to monitor. Call with any concerns.

## 2019-11-20 NOTE — Assessment & Plan Note (Signed)
Congratulated patient on her 14lb weight loss! Continue to work on diet and exercise. Continue to monitor. Call with any concerns.

## 2019-11-20 NOTE — Assessment & Plan Note (Signed)
Does not want to take medicine. Will cut down to 1 pill weekly. Checking labs today. Await results. Call with any concerns.

## 2019-11-20 NOTE — Assessment & Plan Note (Signed)
Doing great. Will decrease losartan to 12.5 due to weight loss. Continue diet and exercise. Continue to monitor. Call with any concerns.

## 2019-11-21 LAB — CBC WITH DIFFERENTIAL/PLATELET
Basophils Absolute: 0 10*3/uL (ref 0.0–0.2)
Basos: 1 %
EOS (ABSOLUTE): 0.1 10*3/uL (ref 0.0–0.4)
Eos: 1 %
Hematocrit: 40.8 % (ref 34.0–46.6)
Hemoglobin: 13.3 g/dL (ref 11.1–15.9)
Immature Grans (Abs): 0 10*3/uL (ref 0.0–0.1)
Immature Granulocytes: 0 %
Lymphocytes Absolute: 1.9 10*3/uL (ref 0.7–3.1)
Lymphs: 26 %
MCH: 28.9 pg (ref 26.6–33.0)
MCHC: 32.6 g/dL (ref 31.5–35.7)
MCV: 89 fL (ref 79–97)
Monocytes Absolute: 0.4 10*3/uL (ref 0.1–0.9)
Monocytes: 5 %
Neutrophils Absolute: 4.8 10*3/uL (ref 1.4–7.0)
Neutrophils: 67 %
Platelets: 302 10*3/uL (ref 150–450)
RBC: 4.6 x10E6/uL (ref 3.77–5.28)
RDW: 12.9 % (ref 11.7–15.4)
WBC: 7.1 10*3/uL (ref 3.4–10.8)

## 2019-11-21 LAB — COMPREHENSIVE METABOLIC PANEL
ALT: 28 IU/L (ref 0–32)
AST: 21 IU/L (ref 0–40)
Albumin/Globulin Ratio: 1.4 (ref 1.2–2.2)
Albumin: 4.3 g/dL (ref 3.8–4.8)
Alkaline Phosphatase: 124 IU/L — ABNORMAL HIGH (ref 48–121)
BUN/Creatinine Ratio: 25 — ABNORMAL HIGH (ref 9–23)
BUN: 13 mg/dL (ref 6–24)
Bilirubin Total: 0.2 mg/dL (ref 0.0–1.2)
CO2: 25 mmol/L (ref 20–29)
Calcium: 9.5 mg/dL (ref 8.7–10.2)
Chloride: 99 mmol/L (ref 96–106)
Creatinine, Ser: 0.52 mg/dL — ABNORMAL LOW (ref 0.57–1.00)
GFR calc Af Amer: 132 mL/min/{1.73_m2} (ref >59)
GFR calc non Af Amer: 114 mL/min/{1.73_m2} (ref >59)
Globulin, Total: 3 g/dL (ref 1.5–4.5)
Glucose: 188 mg/dL — ABNORMAL HIGH (ref 65–99)
Potassium: 4 mmol/L (ref 3.5–5.2)
Sodium: 138 mmol/L (ref 134–144)
Total Protein: 7.3 g/dL (ref 6.0–8.5)

## 2019-11-21 LAB — LIPID PANEL W/O CHOL/HDL RATIO
Cholesterol, Total: 223 mg/dL — ABNORMAL HIGH (ref 100–199)
HDL: 44 mg/dL (ref >39)
LDL Chol Calc (NIH): 141 mg/dL — ABNORMAL HIGH (ref 0–99)
Triglycerides: 208 mg/dL — ABNORMAL HIGH (ref 0–149)
VLDL Cholesterol Cal: 38 mg/dL (ref 5–40)

## 2019-11-21 LAB — HEPATITIS C ANTIBODY: Hep C Virus Ab: <0.1 s/co ratio (ref 0.0–0.9)

## 2019-11-21 LAB — TSH: TSH: 3.87 u[IU]/mL (ref 0.450–4.500)

## 2019-11-24 ENCOUNTER — Telehealth: Payer: Self-pay | Admitting: Obstetrics & Gynecology

## 2019-11-24 NOTE — Telephone Encounter (Signed)
[facility] referring for vaginal bleeding s/p hysterectomy. Called and left voicemail for patient to call back to be scheduled.

## 2019-11-25 NOTE — Telephone Encounter (Signed)
Called and left voicemail for patient to call back to be scheduled. 

## 2019-12-09 ENCOUNTER — Ambulatory Visit
Admission: RE | Admit: 2019-12-09 | Discharge: 2019-12-09 | Disposition: A | Discharge status: Home / Self Care | Payer: 59 | Source: Ambulatory Visit | Attending: Family Medicine | Admitting: Family Medicine

## 2019-12-09 DIAGNOSIS — Z1231 Encounter for screening mammogram for malignant neoplasm of breast: Secondary | ICD-10-CM | POA: Diagnosis not present

## 2019-12-11 ENCOUNTER — Ambulatory Visit (INDEPENDENT_AMBULATORY_CARE_PROVIDER_SITE_OTHER): Payer: 59 | Admitting: Obstetrics and Gynecology

## 2019-12-11 ENCOUNTER — Encounter: Payer: Self-pay | Admitting: Obstetrics and Gynecology

## 2019-12-11 ENCOUNTER — Other Ambulatory Visit (HOSPITAL_COMMUNITY)
Admission: RE | Admit: 2019-12-11 | Discharge: 2019-12-11 | Disposition: A | Discharge status: Home / Self Care | Payer: 59 | Source: Ambulatory Visit | Attending: Obstetrics and Gynecology | Admitting: Obstetrics and Gynecology

## 2019-12-11 ENCOUNTER — Other Ambulatory Visit: Payer: Self-pay

## 2019-12-11 VITALS — BP 126/84 | Ht 59.0 in | Wt 228.0 lb

## 2019-12-11 DIAGNOSIS — N888 Other specified noninflammatory disorders of cervix uteri: Secondary | ICD-10-CM | POA: Insufficient documentation

## 2019-12-11 DIAGNOSIS — Z124 Encounter for screening for malignant neoplasm of cervix: Secondary | ICD-10-CM | POA: Diagnosis not present

## 2019-12-11 NOTE — Progress Notes (Addendum)
Obstetrics & Gynecology Office Visit    Chief Complaint  Patient presents with  . Vaginal Bleeding   The patient is seen in referral at the request of Valerie Roys, DO from [facility] for vaginal bleeding after a history of hysterectomy.   History of Present Illness: 49 y.o. No obstetric history on file. female who is seen in referral from Valerie Roys, DO from [facility] for vaginal bleeding after a history of hysterectomy.  When she saw her PCP on 6/11 she reported what she thought was vaginal bleeding.  She was diagnosed with a UTI at the time and was treated.  Her bleeding stopped and she had recurrence of bleeding today.  The first occurrence of bleeding occurred in March or April.  She describes the bleeding as not much, sometimes bright red, pink, and brown and only noted when she wiped. This lasted just a couple of days.  She second time was late May-early June that she had bleeding that lasted four days, was bright red and there were clots.  Eventually, the color was more brownish.   The last time she had a pap smear was 05/2016 and it was normal and HPV negative. She had a partial hysterectomy in ~2008.  She appears to have had a supracervical hysterectomy as she had a pap smear in 2017 that reported and endocervical component and her PCP reported seeing her cervix on exam.   She denies vaginal symptoms of itching, burning and irritation.  She is sure the bleeding is not anal or rectal.   Denies unintended weight loss.  Denies new bloating, new constipation.    ADDENDUM: I was able to locate the patient's paper chart. This shows an operative report from 01/02/2007 for a laparoscopic supracervical hysterectomy.  Ovaries and fallopian tubes remain.  Pathology also reviewed and report states no abnormal pathology.   Past Medical History:  Diagnosis Date  . Asthma   . Diabetes mellitus without complication ([place]) 2841  . GERD (gastroesophageal reflux  disease)   . Heart murmur    child  . Hypertension   . Shingles   . Sleep apnea    USE CPAP    Past Surgical History:  Procedure Laterality Date  . ABDOMINAL HYSTERECTOMY     Partial  . CESAREAN SECTION     X 2  . CHOLECYSTECTOMY N/A 01/18/2017   Procedure: LAPAROSCOPIC CHOLECYSTECTOMY WITH INTRAOPERATIVE CHOLANGIOGRAM;  Surgeon: Robert Bellow, MD;  Location: [facility] ORS;  Service: General;  Laterality: N/A;  . DIAGNOSTIC LAPAROSCOPY  2006   EXCISION OF ABDOMINAL WALL ENDOMETRIOMA  . SPINAL FUSION  2012   C3 and C4    Gynecologic History: No LMP recorded (lmp unknown). Patient has had a hysterectomy.   Family History  Problem Relation Age of Onset  . Hypertension Mother   . Diabetes Mother   . Hypertension Father   . Graves' disease Sister   . Clotting disorder Brother   . Heart disease Maternal Grandmother   . Heart disease Maternal Grandfather   . Kidney disease Neg Hx   . Breast cancer Neg Hx     Social History   Socioeconomic History  . Marital status: Married    Spouse name: Not on file  . Number of children: Not on file  . Years of education: Not on file  . Highest education level: Not on file  Occupational History  . Not on file  Tobacco Use  . Smoking status: Never Smoker  .  Smokeless tobacco: Never Used  Vaping Use  . Vaping Use: Never used  Substance and Sexual Activity  . Alcohol use: Yes    Comment: on occasion  . Drug use: No  . Sexual activity: Never    Birth control/protection: None, Surgical  Other Topics Concern  . Not on file  Social History Narrative  . Not on file   Social Determinants of Health   Financial Resource Strain:   . Difficulty of Paying Living Expenses:   Food Insecurity:   . Worried About Running Out of Food in the Last Year:   . Ran Out of Food in the Last Year:   Transportation Needs:   . Lack of Transportation (Medical):   . Lack of Transportation (Non-Medical):   Physical Activity:   . Days of Exercise  per Week:   . Minutes of Exercise per Session:   Stress:   . Feeling of Stress :   Social Connections:   . Frequency of Communication with Friends and Family:   . Frequency of Social Gatherings with Friends and Family:   . Attends Religious Services:   . Active Member of Clubs or Organizations:   . Attends Club or Organization Meetings:   . Marital Status:   Intimate Partner Violence:   . Fear of Current or Ex-Partner:   . Emotionally Abused:   . Physically Abused:   . Sexually Abused:     Allergies  Allergen Reactions  . Lisinopril Cough  . Ultram [Tramadol Hcl] Nausea And Vomiting    Severe vomiting   . Victoza [Liraglutide] Nausea And Vomiting    Prior to Admission medications   Medication Sig Start Date End Date Taking? Authorizing Provider  atorvastatin (LIPITOR) 40 MG tablet Take 1 tablet (40 mg total) by mouth once a week. 11/20/19  Yes Johnson, Megan P, DO  glucose blood (FREESTYLE LITE) test strip USE AS DIRECTED 09/26/17  Yes Johnson, Megan P, DO  glucose monitoring kit (FREESTYLE) monitoring kit 1 each by Does not apply route as needed for other. 09/26/17  Yes Johnson, Megan P, DO  Lancets (FREESTYLE) lancets 1 each by Other route See admin instructions. Use as instructed 09/26/17  Yes Johnson, Megan P, DO  losartan (COZAAR) 25 MG tablet Take 0.5 tablets (12.5 mg total) by mouth daily. 11/20/19  Yes Johnson, Megan P, DO    Review of Systems  Constitutional: Negative.   HENT: Negative.   Eyes: Negative.   Respiratory: Negative.   Cardiovascular: Negative.   Gastrointestinal: Negative.   Genitourinary: Negative.   Musculoskeletal: Negative.   Skin: Negative.   Neurological: Negative.   Psychiatric/Behavioral: Negative.      Physical Exam BP 126/84   Ht 4' 11" (1.499 m)   Wt 228 lb (103.4 kg)   LMP  (LMP Unknown)   BMI 46.05 kg/m  No LMP recorded (lmp unknown). Patient has had a hysterectomy. Physical Exam Constitutional:      General: She is not in acute  distress.    Appearance: Normal appearance. She is well-developed.  Genitourinary:     Pelvic exam was performed with patient in the lithotomy position.     Vulva, inguinal canal, urethra, bladder, vagina, right adnexa and left adnexa normal.     No posterior fourchette tenderness, injury or lesion present.     Cervical bleeding present.     No cervical friability, lesion or polyp.     Uterus is absent.     Genitourinary Comments: Bleeding appears to be coming   from cervix (inside). No apparent lesion or polyp  HENT:     Head: Normocephalic and atraumatic.  Eyes:     General: No scleral icterus.    Conjunctiva/sclera: Conjunctivae normal.  Cardiovascular:     Rate and Rhythm: Normal rate and regular rhythm.     Heart sounds: No murmur heard.  No friction rub. No gallop.   Pulmonary:     Effort: Pulmonary effort is normal. No respiratory distress.     Breath sounds: Normal breath sounds. No wheezing or rales.  Abdominal:     General: Bowel sounds are normal. There is no distension.     Palpations: Abdomen is soft. There is no mass.     Tenderness: There is no abdominal tenderness. There is no guarding or rebound.  Musculoskeletal:        General: Normal range of motion.     Cervical back: Normal range of motion and neck supple.  Neurological:     General: No focal deficit present.     Mental Status: She is alert and oriented to person, place, and time.     Cranial Nerves: No cranial nerve deficit.  Skin:    General: Skin is warm and dry.     Findings: No erythema.  Psychiatric:        Mood and Affect: Mood normal.        Behavior: Behavior normal.        Judgment: Judgment normal.     Female chaperone present for pelvic and breast  portions of the physical exam  Assessment: 49 y.o. No obstetric history on file. female here for  1. Bleeding of cervix   2. Pap smear for cervical cancer screening      Plan: Problem List Items Addressed This Visit    None    Visit  Diagnoses    Bleeding of cervix    -  Primary   Relevant Orders   US PELVIS TRANSVAGINAL NON-OB (TV ONLY)   Cytology - PAP   Pap smear for cervical cancer screening         No apparent source for bleeding noted today.  Pap smear performed. Discussed possible etiologies, including; endocervical polyp, remnants of endometrial tissue from supracervical hysterectomy, malignancy.    Will performed pelvic ultrasound to assess cervix further.   A total of 30 minutes were spent face-to-face with the patient as well as preparation, review, communication, and documentation during this encounter.    Prentice Docker, MD 12/11/2019 4:01 PM    CC: Valerie Roys, DO [place],  [place] [id number]

## 2019-12-17 LAB — CYTOLOGY - PAP
Comment: NEGATIVE
Diagnosis: NEGATIVE
High risk HPV: NEGATIVE

## 2019-12-28 ENCOUNTER — Ambulatory Visit (INDEPENDENT_AMBULATORY_CARE_PROVIDER_SITE_OTHER): Payer: 59 | Admitting: Obstetrics and Gynecology

## 2019-12-28 ENCOUNTER — Encounter: Payer: Self-pay | Admitting: Obstetrics and Gynecology

## 2019-12-28 ENCOUNTER — Ambulatory Visit (INDEPENDENT_AMBULATORY_CARE_PROVIDER_SITE_OTHER): Payer: 59

## 2019-12-28 ENCOUNTER — Other Ambulatory Visit: Payer: Self-pay | Admitting: Obstetrics and Gynecology

## 2019-12-28 ENCOUNTER — Other Ambulatory Visit: Payer: Self-pay

## 2019-12-28 VITALS — BP 122/74 | Ht 59.0 in | Wt 231.0 lb

## 2019-12-28 DIAGNOSIS — N888 Other specified noninflammatory disorders of cervix uteri: Secondary | ICD-10-CM | POA: Diagnosis not present

## 2019-12-28 NOTE — Progress Notes (Signed)
Gynecology Ultrasound Follow Up   Chief Complaint  Patient presents with  . Follow-up  Ultrasound for cyclic vaginal bleeding   History of Present Illness: Patient is a 49 y.o. female who presents today for ultrasound evaluation of the above .  Ultrasound demonstrates the following findings Adnexa: no masses seen  Uterus: absent  Additional: cervix with multiple nabothian cysts.  Pap smear on 12/11/19 - NILM, hpv negative.  She reports no bleeding since her last visit.   Past Medical History:  Diagnosis Date  . Asthma   . Diabetes mellitus without complication ([place]) 3295  . GERD (gastroesophageal reflux disease)   . Heart murmur    child  . Hypertension   . Shingles   . Sleep apnea    USE CPAP    Past Surgical History:  Procedure Laterality Date  . ABDOMINAL HYSTERECTOMY     Partial  . CESAREAN SECTION     X 2  . CHOLECYSTECTOMY N/A 01/18/2017   Procedure: LAPAROSCOPIC CHOLECYSTECTOMY WITH INTRAOPERATIVE CHOLANGIOGRAM;  Surgeon: Robert Bellow, MD;  Location: [facility] ORS;  Service: General;  Laterality: N/A;  . DIAGNOSTIC LAPAROSCOPY  2006   EXCISION OF ABDOMINAL WALL ENDOMETRIOMA  . SPINAL FUSION  2012   C3 and C4    Family History  Problem Relation Age of Onset  . Hypertension Mother   . Diabetes Mother   . Hypertension Father   . Graves' disease Sister   . Clotting disorder Brother   . Heart disease Maternal Grandmother   . Heart disease Maternal Grandfather   . Kidney disease Neg Hx   . Breast cancer Neg Hx     Social History   Socioeconomic History  . Marital status: Married    Spouse name: Not on file  . Number of children: Not on file  . Years of education: Not on file  . Highest education level: Not on file  Occupational History  . Not on file  Tobacco Use  . Smoking status: Never Smoker  . Smokeless tobacco: Never Used  Vaping Use  . Vaping Use: Never used  Substance and Sexual Activity  . Alcohol use: Yes    Comment: on occasion   . Drug use: No  . Sexual activity: Never    Birth control/protection: None, Surgical  Other Topics Concern  . Not on file  Social History Narrative  . Not on file   Social Determinants of Health   Financial Resource Strain:   . Difficulty of Paying Living Expenses:   Food Insecurity:   . Worried About Charity fundraiser in the Last Year:   . Arboriculturist in the Last Year:   Transportation Needs:   . Film/video editor (Medical):   Marland Kitchen Lack of Transportation (Non-Medical):   Physical Activity:   . Days of Exercise per Week:   . Minutes of Exercise per Session:   Stress:   . Feeling of Stress :   Social Connections:   . Frequency of Communication with Friends and Family:   . Frequency of Social Gatherings with Friends and Family:   . Attends Religious Services:   . Active Member of Clubs or Organizations:   . Attends Archivist Meetings:   Marland Kitchen Marital Status:   Intimate Partner Violence:   . Fear of Current or Ex-Partner:   . Emotionally Abused:   Marland Kitchen Physically Abused:   . Sexually Abused:     Allergies  Allergen Reactions  . Lisinopril Cough  .  Ultram [Tramadol Hcl] Nausea And Vomiting    Severe vomiting   . Victoza [Liraglutide] Nausea And Vomiting    Prior to Admission medications   Medication Sig Start Date End Date Taking? Authorizing Provider  atorvastatin (LIPITOR) 40 MG tablet Take 1 tablet (40 mg total) by mouth once a week. 11/20/19   Park Liter P, DO  glucose blood (FREESTYLE LITE) test strip USE AS DIRECTED 09/26/17   Park Liter P, DO  glucose monitoring kit (FREESTYLE) monitoring kit 1 each by Does not apply route as needed for other. 09/26/17   Johnson, Megan P, DO  Lancets (FREESTYLE) lancets 1 each by Other route See admin instructions. Use as instructed 09/26/17   Park Liter P, DO  losartan (COZAAR) 25 MG tablet Take 0.5 tablets (12.5 mg total) by mouth daily. 11/20/19   Johnson, Megan P, DO  nitrofurantoin,  macrocrystal-monohydrate, (MACROBID) 100 MG capsule Take 1 capsule (100 mg total) by mouth 2 (two) times daily. Patient not taking: Reported on 12/11/2019 11/20/19   Valerie Roys, DO    Physical Exam BP 122/74   Ht 4' 11"  (1.499 m)   Wt 231 lb (104.8 kg)   LMP  (LMP Unknown)   BMI 46.66 kg/m    General: NAD HEENT: normocephalic, anicteric Pulmonary: No increased work of breathing Extremities: no edema, erythema, or tenderness Neurologic: Grossly intact, normal gait Psychiatric: mood appropriate, affect full  Imaging Results US PELVIC COMPLETE WITH TRANSVAGINAL  Result Date: 12/28/2019 Patient Name: Tricia Hill DOB: 20-Dec-1970 MRN: [id number] ULTRASOUND REPORT Location: [facility] Date of Service: 12/28/2019 Indications: Cervical bleeding Findings: The uterus is absent. The cervix measures 3.5 cm. There are multiple nabothian cysts within the cervix. Right Ovary measures 2.7 x 1.4 x 1.6 cm. It is normal in appearance. Left Ovary is not visible. Survey of the adnexa demonstrates no adnexal masses. There is no free fluid in the cul de sac. Impression: 1. The uterus is absent. 2. The cervix is heterogeneous with possible nabothian cysts. 3. Normal appearing right ovary. 4. The left ovary is not visible. Gweneth Dimitri, RT The ultrasound images and findings were reviewed by me and I agree with the above report. Prentice Docker, MD, [facility], [facility] 12/28/2019 3:45 PM       Assessment: 49 y.o. No obstetric history on file.  1. Bleeding of cervix      Plan: Problem List Items Addressed This Visit    None    Visit Diagnoses    Bleeding of cervix    -  Primary     Discussed options for bleeding from the cervix.  Do nothing at this time, possible hormonal therapy to reduce bleeding, surgery with trachelectomy.  She opts to continue to see how things go at this time. Discussed ECC for further evaluation.  Will hold off at this time given negative  pap smear and cytologic brushing of the endocervix along with ultrasound findings.  A total of 20 minutes were spent face-to-face with the patient as well as preparation, review, communication, and documentation during this encounter.    Prentice Docker, MD, [facility], [facility] 12/28/2019 3:56 PM    CC: Valerie Roys, DO [place],  [place] [id number]

## 2020-02-23 ENCOUNTER — Other Ambulatory Visit: Payer: Self-pay

## 2020-02-23 ENCOUNTER — Ambulatory Visit (INDEPENDENT_AMBULATORY_CARE_PROVIDER_SITE_OTHER): Payer: 59 | Admitting: Family Medicine

## 2020-02-23 ENCOUNTER — Encounter: Payer: Self-pay | Admitting: Family Medicine

## 2020-02-23 VITALS — BP 132/88 | HR 62 | Temp 98.5°F | Ht 59.0 in | Wt 231.0 lb

## 2020-02-23 DIAGNOSIS — E785 Hyperlipidemia, unspecified: Secondary | ICD-10-CM | POA: Diagnosis not present

## 2020-02-23 DIAGNOSIS — E1169 Type 2 diabetes mellitus with other specified complication: Secondary | ICD-10-CM

## 2020-02-23 DIAGNOSIS — Z23 Encounter for immunization: Secondary | ICD-10-CM

## 2020-02-23 DIAGNOSIS — E1165 Type 2 diabetes mellitus with hyperglycemia: Secondary | ICD-10-CM

## 2020-02-23 DIAGNOSIS — I10 Essential (primary) hypertension: Secondary | ICD-10-CM

## 2020-02-23 LAB — BAYER DCA HB A1C WAIVED: HB A1C (BAYER DCA - WAIVED): 9.1 % — ABNORMAL HIGH (ref <7.0)

## 2020-02-23 MED ORDER — FREESTYLE LANCETS MISC: 1.0000 | 12 refills | Status: DC

## 2020-02-23 MED ORDER — FREESTYLE LITE TEST VI STRP: ORAL_STRIP | 12 refills | Status: DC

## 2020-02-23 NOTE — Assessment & Plan Note (Signed)
Under good control on current regimen. Continue current regimen. Continue to monitor. Call with any concerns. Refills up to date.   

## 2020-02-23 NOTE — Assessment & Plan Note (Signed)
Going in the wrong direction with A1c of 9.1. Will really work on her diet and we'll recheck 3 months. Call with any concerns.

## 2020-02-23 NOTE — Patient Instructions (Signed)
[place],  Even[employer]

## 2020-02-23 NOTE — Progress Notes (Signed)
BP 132/88   Pulse 62   Temp 98.5 F (36.9 C) (Oral)   Ht 4\' 11"  (1.499 m)   Wt 231 lb (104.8 kg)   LMP  (LMP Unknown)   SpO2 96%   BMI 46.66 kg/m    Subjective:    Patient ID: Tricia Hill, female    DOB: 08-22-1970, 49 y.o.   MRN: [id number]  HPI: ABIOLA BEHRING is a 49 y.o. female  Chief Complaint  Patient presents with  . Diabetes  . Flu Vaccine  . Hypertension  . Hyperlipidemia   DIABETES Hypoglycemic episodes:no Polydipsia/polyuria: no Visual disturbance: no Chest pain: no Paresthesias: no Glucose Monitoring: yes  Accucheck frequency: Daily  Fasting glucose: 200 Taking Insulin?: no Blood Pressure Monitoring: not checking Retinal Examination: Up to Date Foot Exam: Up to Date Diabetic Education: Completed Pneumovax: Up to Date Influenza: Up to Date Aspirin: no  HYPERTENSION / [place] Satisfied with current treatment? yes Duration of hypertension: chronic BP monitoring frequency: not checking BP medication side effects: no Past BP meds: losartan Duration of hyperlipidemia: chronic Cholesterol medication side effects: no Cholesterol supplements: none Past cholesterol medications: atorvastatin Medication compliance: excellent compliance Aspirin: no Recent stressors: no Recurrent headaches: no Visual changes: no Palpitations: no Dyspnea: no Chest pain: no Lower extremity edema: no Dizzy/lightheaded: no   Relevant past medical, surgical, family and social history reviewed and updated as indicated. Interim medical history since our last visit reviewed. Allergies and medications reviewed and updated.  Review of Systems  Constitutional: Negative.   Respiratory: Negative.   Cardiovascular: Negative.   Gastrointestinal: Negative.   Musculoskeletal: Negative.   Neurological: Negative.   Psychiatric/Behavioral: Negative.     Per HPI unless specifically indicated above     Objective:    BP 132/88   Pulse 62   Temp 98.5 F  (36.9 C) (Oral)   Ht 4\' 11"  (1.499 m)   Wt 231 lb (104.8 kg)   LMP  (LMP Unknown)   SpO2 96%   BMI 46.66 kg/m   Wt Readings from Last 3 Encounters:  02/23/20 231 lb (104.8 kg)  12/28/19 231 lb (104.8 kg)  12/11/19 228 lb (103.4 kg)    Physical Exam Vitals and nursing note reviewed.  Constitutional:      General: She is not in acute distress.    Appearance: Normal appearance. She is not ill-appearing, toxic-appearing or diaphoretic.  HENT:     Head: Normocephalic and atraumatic.     Right Ear: External ear normal.     Left Ear: External ear normal.     Nose: Nose normal.     Mouth/Throat:     Mouth: Mucous membranes are moist.     Pharynx: Oropharynx is clear.  Eyes:     General: No scleral icterus.       Right eye: No discharge.        Left eye: No discharge.     Extraocular Movements: Extraocular movements intact.     Conjunctiva/sclera: Conjunctivae normal.     Pupils: Pupils are equal, round, and reactive to light.  Cardiovascular:     Rate and Rhythm: Normal rate and regular rhythm.     Pulses: Normal pulses.     Heart sounds: Normal heart sounds. No murmur heard.  No friction rub. No gallop.   Pulmonary:     Effort: Pulmonary effort is normal. No respiratory distress.     Breath sounds: Normal breath sounds. No stridor. No wheezing, rhonchi or rales.  Chest:  Chest wall: No tenderness.  Musculoskeletal:        General: Normal range of motion.     Cervical back: Normal range of motion and neck supple.  Skin:    General: Skin is warm and dry.     Capillary Refill: Capillary refill takes less than 2 seconds.     Coloration: Skin is not jaundiced or pale.     Findings: No bruising, erythema, lesion or rash.  Neurological:     General: No focal deficit present.     Mental Status: She is alert and oriented to person, place, and time. Mental status is at baseline.  Psychiatric:        Mood and Affect: Mood normal.        Behavior: Behavior normal.         Thought Content: Thought content normal.        Judgment: Judgment normal.     Results for orders placed or performed in visit on 12/11/19  Cytology - PAP  Result Value Ref Range   High risk HPV Negative    Adequacy Satisfactory for evaluation.    Diagnosis      - Negative for intraepithelial lesion or malignancy (NILM)   Comment Normal Reference Range HPV - Negative       Assessment & Plan:   Problem List Items Addressed This Visit      Cardiovascular and Mediastinum   HTN (hypertension)    Under good control on current regimen. Continue current regimen. Continue to monitor. Call with any concerns. Refills up to date.          Endocrine   Uncontrolled diabetes mellitus ([place]) - Primary    Going in the wrong direction with A1c of 9.1. Will really work on her diet and we'll recheck 3 months. Call with any concerns.       Relevant Orders   Bayer DCA Hb A1c Waived   Hyperlipidemia associated with type 2 diabetes mellitus ([place])    Under good control on current regimen. Continue current regimen. Continue to monitor. Call with any concerns. Refills up to date.         Other Visit Diagnoses    Need for influenza vaccination       Flu shot given today.    Relevant Orders   Flu Vaccine QUAD 6+ mos PF IM (Fluarix Quad PF)       Follow up plan: Return in about 3 months (around 05/24/2020).  >30 minutes spent with patient today.

## 2020-05-31 DIAGNOSIS — Z03818 Encounter for observation for suspected exposure to other biological agents ruled out: Secondary | ICD-10-CM | POA: Diagnosis not present

## 2020-05-31 DIAGNOSIS — Z1152 Encounter for screening for COVID-19: Secondary | ICD-10-CM | POA: Diagnosis not present

## 2020-06-01 ENCOUNTER — Telehealth (INDEPENDENT_AMBULATORY_CARE_PROVIDER_SITE_OTHER): Payer: 59 | Admitting: Family Medicine

## 2020-06-01 ENCOUNTER — Encounter: Payer: Self-pay | Admitting: Family Medicine

## 2020-06-01 ENCOUNTER — Other Ambulatory Visit: Payer: Self-pay | Admitting: Family Medicine

## 2020-06-01 DIAGNOSIS — E1169 Type 2 diabetes mellitus with other specified complication: Secondary | ICD-10-CM

## 2020-06-01 DIAGNOSIS — I10 Essential (primary) hypertension: Secondary | ICD-10-CM | POA: Diagnosis not present

## 2020-06-01 DIAGNOSIS — J0100 Acute maxillary sinusitis, unspecified: Secondary | ICD-10-CM

## 2020-06-01 DIAGNOSIS — E1165 Type 2 diabetes mellitus with hyperglycemia: Secondary | ICD-10-CM | POA: Diagnosis not present

## 2020-06-01 DIAGNOSIS — E785 Hyperlipidemia, unspecified: Secondary | ICD-10-CM | POA: Diagnosis not present

## 2020-06-01 MED ORDER — FREESTYLE LITE TEST VI STRP
ORAL_STRIP | 12 refills | Status: DC
Start: 2020-06-01 — End: 2021-03-08

## 2020-06-01 MED ORDER — DOXYCYCLINE HYCLATE 100 MG PO TABS: 100.0000 mg | ORAL_TABLET | Freq: Two times a day (BID) | ORAL | 0 refills | Status: DC

## 2020-06-01 MED ORDER — FREESTYLE LANCETS MISC: 1.0000 | 12 refills | Status: DC

## 2020-06-01 MED ORDER — PREDNISONE 50 MG PO TABS: 50.0000 mg | ORAL_TABLET | Freq: Every day | ORAL | 0 refills | Status: DC

## 2020-06-01 MED ORDER — ATORVASTATIN CALCIUM 40 MG PO TABS: 40.0000 mg | ORAL_TABLET | ORAL | 0 refills | Status: DC

## 2020-06-01 MED ORDER — HYDROCOD POLST-CPM POLST ER 10-8 MG/5ML PO SUER: 5.0000 mL | Freq: Two times a day (BID) | ORAL | 0 refills | Status: DC

## 2020-06-01 MED ORDER — LOSARTAN POTASSIUM 25 MG PO TABS: 12.5000 mg | ORAL_TABLET | Freq: Every day | ORAL | 1 refills | Status: DC

## 2020-06-01 NOTE — Assessment & Plan Note (Signed)
Under good control on current regimen. Continue current regimen. Continue to monitor. Call with any concerns. Refills given. Will get her in for labs as soon as she is feeling better.

## 2020-06-01 NOTE — Assessment & Plan Note (Signed)
Under good control on current regimen. Continue current regimen. Continue to monitor. Call with any concerns. Refills given. Will get her in for labs as soon as she is feeling better.    

## 2020-06-01 NOTE — Assessment & Plan Note (Signed)
Likely is going to need to go back on her medicine. Will get her in for labs as soon as she is feeling better. Await results. Treat as needed.

## 2020-06-01 NOTE — Progress Notes (Signed)
LMP  (LMP Unknown)    Subjective:    Patient ID: Tricia Hill, female    DOB: 01/22/1971, 49 y.o.   MRN: [id number]  HPI: Tricia Hill is a 49 y.o. female  Chief Complaint  Patient presents with  . Diabetes    3 month follow up, got changed to virtual visit due to being sick  . Sinusitis    Pt states she feels pressure in her face, has cough, congestion. Has been sick for 5 days. Had covid test done it was negative.    UPPER RESPIRATORY TRACT INFECTION- COVID negative yesterday Duration: 5 days Worst symptom: congestion, cough Fever: no Cough: yes Shortness of breath: yes Wheezing: yes Chest pain: no Chest tightness: yes Chest congestion: no Nasal congestion: yes Runny nose: yes Post nasal drip: no Sneezing: no Sore throat: no Swollen glands: no Sinus pressure: yes Headache: yes Face pain: no Toothache: no Ear pain: no  Ear pressure: no  Eyes red/itching:no Eye drainage/crusting: no  Vomiting: no Rash: no Fatigue: yes Sick contacts: no Strep contacts: no  Context: worse Recurrent sinusitis: no Relief with OTC cold/cough medications: no  Treatments attempted: cold/sinus, mucinex and anti-histamine   DIABETES Hypoglycemic episodes:no Polydipsia/polyuria: no Visual disturbance: no Chest pain: no Paresthesias: no Glucose Monitoring: yes  Accucheck frequency: Daily  Fasting glucose: a little high Taking Insulin?: no Blood Pressure Monitoring: not checking Retinal Examination: Up to Date Foot Exam: Up to Date Diabetic Education: Completed Pneumovax: Up to Date Influenza: Up to Date Aspirin: yes   HYPERTENSION / HYPERLIPIDEMIA Satisfied with current treatment? yes Duration of hypertension: chronic BP monitoring frequency: not checking BP medication side effects: no Past BP meds: losartan Duration of hyperlipidemia: chronic Cholesterol medication side effects: no Cholesterol supplements: none Past cholesterol medications:  atorvastatin Medication compliance: excellent compliance Aspirin: no Recent stressors: no Recurrent headaches: no Visual changes: no Palpitations: no Dyspnea: no Chest pain: no Lower extremity edema: no Dizzy/lightheaded: no  Relevant past medical, surgical, family and social history reviewed and updated as indicated. Interim medical history since our last visit reviewed. Allergies and medications reviewed and updated.  Review of Systems  Constitutional: Negative.   HENT: Positive for congestion, postnasal drip, rhinorrhea, sinus pressure and sinus pain. Negative for dental problem, drooling, ear discharge, ear pain, facial swelling, hearing loss, mouth sores, nosebleeds, sneezing, sore throat, tinnitus, trouble swallowing and voice change.   Respiratory: Positive for cough, shortness of breath and wheezing. Negative for apnea, choking, chest tightness and stridor.   Cardiovascular: Negative.   Gastrointestinal: Negative.   Psychiatric/Behavioral: Negative.     Per HPI unless specifically indicated above     Objective:    LMP  (LMP Unknown)   Wt Readings from Last 3 Encounters:  02/23/20 231 lb (104.8 kg)  12/28/19 231 lb (104.8 kg)  12/11/19 228 lb (103.4 kg)    Physical Exam Vitals and nursing note reviewed.  Constitutional:      General: She is not in acute distress.    Appearance: Normal appearance. She is not ill-appearing, toxic-appearing or diaphoretic.  HENT:     Head: Normocephalic and atraumatic.     Right Ear: External ear normal.     Left Ear: External ear normal.     Nose: Nose normal.     Mouth/Throat:     Mouth: Mucous membranes are moist.     Pharynx: Oropharynx is clear.  Eyes:     General: No scleral icterus.       Right  eye: No discharge.        Left eye: No discharge.     Conjunctiva/sclera: Conjunctivae normal.     Pupils: Pupils are equal, round, and reactive to light.  Pulmonary:     Effort: Pulmonary effort is normal. No respiratory  distress.     Comments: Speaking in full sentences Musculoskeletal:        General: Normal range of motion.     Cervical back: Normal range of motion.  Skin:    Coloration: Skin is not jaundiced or pale.     Findings: No bruising, erythema, lesion or rash.  Neurological:     Mental Status: She is alert and oriented to person, place, and time. Mental status is at baseline.  Psychiatric:        Mood and Affect: Mood normal.        Behavior: Behavior normal.        Thought Content: Thought content normal.        Judgment: Judgment normal.     Results for orders placed or performed in visit on 02/23/20  Bayer DCA Hb A1c Waived  Result Value Ref Range   HB A1C (BAYER DCA - WAIVED) 9.1 (H) <7.0 %      Assessment & Plan:   Problem List Items Addressed This Visit      Cardiovascular and Mediastinum   HTN (hypertension)    Under good control on current regimen. Continue current regimen. Continue to monitor. Call with any concerns. Refills given. Will get her in for labs as soon as she is feeling better.         Relevant Medications   atorvastatin (LIPITOR) 40 MG tablet   losartan (COZAAR) 25 MG tablet   Other Relevant Orders   Comprehensive metabolic panel     Endocrine   Uncontrolled diabetes mellitus ([place])    Likely is going to need to go back on her medicine. Will get her in for labs as soon as she is feeling better. Await results. Treat as needed.       Relevant Medications   atorvastatin (LIPITOR) 40 MG tablet   losartan (COZAAR) 25 MG tablet   Other Relevant Orders   Comprehensive metabolic panel   Bayer DCA Hb A1c Waived   Hyperlipidemia associated with type 2 diabetes mellitus ([place])    Under good control on current regimen. Continue current regimen. Continue to monitor. Call with any concerns. Refills given. Will get her in for labs as soon as she is feeling better.         Relevant Medications   atorvastatin (LIPITOR) 40 MG tablet   losartan (COZAAR) 25 MG  tablet   Other Relevant Orders   Comprehensive metabolic panel   Lipid Panel w/o Chol/HDL Ratio    Other Visit Diagnoses    Acute non-recurrent maxillary sinusitis    -  Primary   Will treat with prednisone and tussionex, if not geeling better in a few days, will start antibiotic. Call with any concerns or not getting better.    Relevant Medications   predniSONE (DELTASONE) 50 MG tablet   doxycycline (VIBRA-TABS) 100 MG tablet   chlorpheniramine-HYDROcodone (TUSSIONEX PENNKINETIC ER) 10-8 MG/5ML SUER       Follow up plan: Return in about 3 months (around 08/30/2020).   . This visit was completed via MyChart due to the restrictions of the COVID-19 pandemic. All issues as above were discussed and addressed. Physical exam was done as above through visual confirmation on MyChart.  If it was felt that the patient should be evaluated in the office, they were directed there. The patient verbally consented to this visit. . Location of the patient: home . Location of the provider: work . Those involved with this call:  . Provider: Park Liter, DO . CMA: Louanna Raw, [place] . Front Desk/Registration: Jill Side  . Time spent on call: 25 minutes with patient face to face via video conference. More than 50% of this time was spent in counseling and coordination of care. 40 minutes total spent in review of patient's record and preparation of their chart.

## 2020-07-06 DIAGNOSIS — H524 Presbyopia: Secondary | ICD-10-CM | POA: Diagnosis not present

## 2020-07-06 LAB — HM DIABETES EYE EXAM

## 2020-08-16 ENCOUNTER — Other Ambulatory Visit: Payer: 59

## 2020-08-16 ENCOUNTER — Other Ambulatory Visit: Payer: Self-pay

## 2020-08-16 DIAGNOSIS — I10 Essential (primary) hypertension: Secondary | ICD-10-CM

## 2020-08-16 DIAGNOSIS — E1165 Type 2 diabetes mellitus with hyperglycemia: Secondary | ICD-10-CM | POA: Diagnosis not present

## 2020-08-16 DIAGNOSIS — E1169 Type 2 diabetes mellitus with other specified complication: Secondary | ICD-10-CM

## 2020-08-16 DIAGNOSIS — E785 Hyperlipidemia, unspecified: Secondary | ICD-10-CM | POA: Diagnosis not present

## 2020-08-16 LAB — BAYER DCA HB A1C WAIVED: HB A1C (BAYER DCA - WAIVED): 9.7 % — ABNORMAL HIGH (ref <7.0)

## 2020-08-17 LAB — COMPREHENSIVE METABOLIC PANEL
ALT: 22 IU/L (ref 0–32)
AST: 17 IU/L (ref 0–40)
Albumin/Globulin Ratio: 1.5 (ref 1.2–2.2)
Albumin: 4 g/dL (ref 3.8–4.8)
Alkaline Phosphatase: 121 IU/L (ref 44–121)
BUN/Creatinine Ratio: 25 — ABNORMAL HIGH (ref 9–23)
BUN: 14 mg/dL (ref 6–24)
Bilirubin Total: <0.2 mg/dL (ref 0.0–1.2)
CO2: 21 mmol/L (ref 20–29)
Calcium: 8.9 mg/dL (ref 8.7–10.2)
Chloride: 104 mmol/L (ref 96–106)
Creatinine, Ser: 0.57 mg/dL (ref 0.57–1.00)
Globulin, Total: 2.6 g/dL (ref 1.5–4.5)
Glucose: 168 mg/dL — ABNORMAL HIGH (ref 65–99)
Potassium: 4.2 mmol/L (ref 3.5–5.2)
Sodium: 141 mmol/L (ref 134–144)
Total Protein: 6.6 g/dL (ref 6.0–8.5)
eGFR: 112 mL/min/{1.73_m2} (ref >59)

## 2020-08-17 LAB — LIPID PANEL W/O CHOL/HDL RATIO
Cholesterol, Total: 190 mg/dL (ref 100–199)
HDL: 48 mg/dL (ref >39)
LDL Chol Calc (NIH): 107 mg/dL — ABNORMAL HIGH (ref 0–99)
Triglycerides: 203 mg/dL — ABNORMAL HIGH (ref 0–149)
VLDL Cholesterol Cal: 35 mg/dL (ref 5–40)

## 2020-08-19 ENCOUNTER — Other Ambulatory Visit: Payer: Self-pay | Admitting: Family Medicine

## 2020-08-19 MED ORDER — METFORMIN HCL 1000 MG PO TABS: 1000.0000 mg | ORAL_TABLET | Freq: Two times a day (BID) | ORAL | 1 refills | Status: DC

## 2020-09-01 ENCOUNTER — Ambulatory Visit (INDEPENDENT_AMBULATORY_CARE_PROVIDER_SITE_OTHER): Payer: 59 | Admitting: Family Medicine

## 2020-09-01 ENCOUNTER — Other Ambulatory Visit: Payer: Self-pay

## 2020-09-01 ENCOUNTER — Encounter: Payer: Self-pay | Admitting: Family Medicine

## 2020-09-01 ENCOUNTER — Other Ambulatory Visit: Payer: Self-pay | Admitting: Family Medicine

## 2020-09-01 VITALS — BP 134/83 | HR 73 | Temp 98.0°F | Ht 59.75 in | Wt 243.0 lb

## 2020-09-01 DIAGNOSIS — F4322 Adjustment disorder with anxiety: Secondary | ICD-10-CM | POA: Diagnosis not present

## 2020-09-01 DIAGNOSIS — Z1211 Encounter for screening for malignant neoplasm of colon: Secondary | ICD-10-CM | POA: Diagnosis not present

## 2020-09-01 DIAGNOSIS — E1165 Type 2 diabetes mellitus with hyperglycemia: Secondary | ICD-10-CM

## 2020-09-01 MED ORDER — ESCITALOPRAM OXALATE 5 MG PO TABS: 2.5000 mg | ORAL_TABLET | Freq: Every day | ORAL | 3 refills | Status: DC

## 2020-09-01 NOTE — Progress Notes (Signed)
BP 134/83   Pulse 73   Temp 98 F (36.7 C)   Ht 4' 11.75" (1.518 m)   Wt 243 lb (110.2 kg)   LMP  (LMP Unknown)   SpO2 97%   BMI 47.86 kg/m    Subjective:    Patient ID: Tricia Hill, female    DOB: 25-May-1971, 50 y.o.   MRN: [id number]  HPI: Tricia Hill is a 50 y.o. female  Chief Complaint  Patient presents with  . Hypertension  . Hyperlipidemia  . Diabetes   ANXIETY/STRESS- Has been under a huge amount of stress. Has a lot of family members living with her and having issues with her daughter. Duration: months Status:exacerbated Anxious mood: yes  Excessive worrying: yes Irritability: yes  Sweating: no Nausea: no Palpitations:no Hyperventilation: no Panic attacks: no Agoraphobia: no  Obscessions/compulsions: no Depressed mood: no Depression screen [facility] 2/9 03/13/2019 04/16/2018 02/28/2017 11/08/2015 02/08/2015  Decreased Interest 0 0 0 0 0  Down, Depressed, Hopeless 0 0 0 0 0  PHQ - 2 Score 0 0 0 0 0  Altered sleeping 1 0 - - -  Tired, decreased energy 0 3 - - -  Change in appetite 0 0 - - -  Feeling bad or failure about yourself  0 0 - - -  Trouble concentrating 0 0 - - -  Moving slowly or fidgety/restless 0 0 - - -  Suicidal thoughts 0 0 - - -  PHQ-9 Score 1 3 - - -  Difficult doing work/chores Not difficult at all Not difficult at all - - -   Anhedonia: no Weight changes: no Insomnia: no   Hypersomnia: no Fatigue/loss of energy: yes Feelings of worthlessness: no Feelings of guilt: yes Impaired concentration/indecisiveness: no Suicidal ideations: no  Crying spells: no Recent Stressors/Life Changes: yes   Relationship problems: yes   Family stress: yes     Financial stress: no    Job stress: yes    Recent death/loss: no  DIABETES Hypoglycemic episodes:no Polydipsia/polyuria: yes Visual disturbance: yes Chest pain: no Paresthesias: no Glucose Monitoring: yes  Accucheck frequency: occasionally Taking Insulin?: no Blood Pressure  Monitoring: not checking Retinal Examination: Up to Date Foot Exam: Up to Date Diabetic Education: Completed Pneumovax: Up to Date Influenza: Up to Date Aspirin: no   Relevant past medical, surgical, family and social history reviewed and updated as indicated. Interim medical history since our last visit reviewed. Allergies and medications reviewed and updated.  Review of Systems  Constitutional: Negative.   Respiratory: Negative.   Cardiovascular: Negative.   Gastrointestinal: Negative.   Musculoskeletal: Negative.   Neurological: Negative.   Psychiatric/Behavioral: Positive for dysphoric mood. Negative for agitation, behavioral problems, confusion, decreased concentration, hallucinations, self-injury, sleep disturbance and suicidal ideas. The patient is nervous/anxious. The patient is not hyperactive.     Per HPI unless specifically indicated above     Objective:    BP 134/83   Pulse 73   Temp 98 F (36.7 C)   Ht 4' 11.75" (1.518 m)   Wt 243 lb (110.2 kg)   LMP  (LMP Unknown)   SpO2 97%   BMI 47.86 kg/m   Wt Readings from Last 3 Encounters:  09/01/20 243 lb (110.2 kg)  02/23/20 231 lb (104.8 kg)  12/28/19 231 lb (104.8 kg)    Physical Exam Vitals and nursing note reviewed.  Constitutional:      General: She is not in acute distress.    Appearance: Normal appearance. She is  not ill-appearing, toxic-appearing or diaphoretic.  HENT:     Head: Normocephalic and atraumatic.     Right Ear: External ear normal.     Left Ear: External ear normal.     Nose: Nose normal.     Mouth/Throat:     Mouth: Mucous membranes are moist.     Pharynx: Oropharynx is clear.  Eyes:     General: No scleral icterus.       Right eye: No discharge.        Left eye: No discharge.     Extraocular Movements: Extraocular movements intact.     Conjunctiva/sclera: Conjunctivae normal.     Pupils: Pupils are equal, round, and reactive to light.  Cardiovascular:     Rate and Rhythm:  Normal rate and regular rhythm.     Pulses: Normal pulses.     Heart sounds: Normal heart sounds. No murmur heard. No friction rub. No gallop.   Pulmonary:     Effort: Pulmonary effort is normal. No respiratory distress.     Breath sounds: Normal breath sounds. No stridor. No wheezing, rhonchi or rales.  Chest:     Chest wall: No tenderness.  Musculoskeletal:        General: Normal range of motion.     Cervical back: Normal range of motion and neck supple.  Skin:    General: Skin is warm and dry.     Capillary Refill: Capillary refill takes less than 2 seconds.     Coloration: Skin is not jaundiced or pale.     Findings: No bruising, erythema, lesion or rash.  Neurological:     General: No focal deficit present.     Mental Status: She is alert and oriented to person, place, and time. Mental status is at baseline.  Psychiatric:        Mood and Affect: Mood normal.        Behavior: Behavior normal.        Thought Content: Thought content normal.        Judgment: Judgment normal.     Results for orders placed or performed in visit on 77/93/90  Basic metabolic panel  Result Value Ref Range   Glucose 231 (H) 65 - 99 mg/dL   BUN 7 6 - 24 mg/dL   Creatinine, Ser 0.55 (L) 0.57 - 1.00 mg/dL   eGFR 113 >59 mL/min/1.73   BUN/Creatinine Ratio 13 9 - 23   Sodium 141 134 - 144 mmol/L   Potassium 3.8 3.5 - 5.2 mmol/L   Chloride 103 96 - 106 mmol/L   CO2 20 20 - 29 mmol/L   Calcium 9.0 8.7 - 10.2 mg/dL      Assessment & Plan:   Problem List Items Addressed This Visit      Endocrine   Uncontrolled diabetes mellitus ([place]) - Primary    Just restarted her metformin after elevated A1c. Too soon for recheck. Will check BMP today- recheck A1c in 2.5 months. Call with any concerns. Continue to monitor.       Relevant Orders   Basic metabolic panel (Completed)    Other Visit Diagnoses    Adjustment disorder with anxious mood       Will start low dose lexapro. Continue to monitor. Call  with any concerns.    Screening for colon cancer       Referral to GI placed today.   Relevant Orders   Ambulatory referral to Gastroenterology       Follow up  plan: Return 2.5 months physical.

## 2020-09-01 NOTE — Patient Instructions (Signed)
[facility]- does Trauma therapy

## 2020-09-02 LAB — BASIC METABOLIC PANEL
BUN/Creatinine Ratio: 13 (ref 9–23)
BUN: 7 mg/dL (ref 6–24)
CO2: 20 mmol/L (ref 20–29)
Calcium: 9 mg/dL (ref 8.7–10.2)
Chloride: 103 mmol/L (ref 96–106)
Creatinine, Ser: 0.55 mg/dL — ABNORMAL LOW (ref 0.57–1.00)
Glucose: 231 mg/dL — ABNORMAL HIGH (ref 65–99)
Potassium: 3.8 mmol/L (ref 3.5–5.2)
Sodium: 141 mmol/L (ref 134–144)
eGFR: 113 mL/min/{1.73_m2} (ref >59)

## 2020-09-02 NOTE — Assessment & Plan Note (Signed)
Just restarted her metformin after elevated A1c. Too soon for recheck. Will check BMP today- recheck A1c in 2.5 months. Call with any concerns. Continue to monitor.

## 2020-09-22 DIAGNOSIS — G4733 Obstructive sleep apnea (adult) (pediatric): Secondary | ICD-10-CM | POA: Diagnosis not present

## 2020-09-28 ENCOUNTER — Encounter: Payer: Self-pay | Admitting: *Deleted

## 2020-09-29 ENCOUNTER — Other Ambulatory Visit: Payer: Self-pay

## 2020-09-29 ENCOUNTER — Telehealth (INDEPENDENT_AMBULATORY_CARE_PROVIDER_SITE_OTHER): Payer: Self-pay | Admitting: Gastroenterology

## 2020-09-29 DIAGNOSIS — Z1211 Encounter for screening for malignant neoplasm of colon: Secondary | ICD-10-CM

## 2020-09-29 MED ORDER — NA SULFATE-K SULFATE-MG SULF 17.5-3.13-1.6 GM/177ML PO SOLN
1.0000 | Freq: Once | ORAL | 0 refills | Status: AC
  Filled 2020-09-29: qty 354, 1d supply, fill #0

## 2020-09-29 NOTE — Progress Notes (Signed)
Gastroenterology Pre-Procedure Review  Request Date: 10/13/20 Requesting Physician: Dr. Marius Ditch  PATIENT REVIEW QUESTIONS: The patient responded to the following health history questions as indicated:    1. Are you having any GI issues? no 2. Do you have a personal history of Polyps? no 3. Do you have a family history of Colon Cancer or Polyps? no 4. Diabetes Mellitus? no 5. Joint replacements in the past 12 months?no 6. Major health problems in the past 3 months?no 7. Any artificial heart valves, MVP, or defibrillator?no    MEDICATIONS & ALLERGIES:    Patient reports the following regarding taking any anticoagulation/antiplatelet therapy:   Plavix, Coumadin, Eliquis, Xarelto, Lovenox, Pradaxa, Brilinta, or Effient? no Aspirin? no  Patient confirms/reports the following medications:  Current Outpatient Medications  Medication Sig Dispense Refill  . atorvastatin (LIPITOR) 40 MG tablet Take 1 tablet (40 mg total) by mouth once a week. 52 tablet 0  . chlorpheniramine-HYDROcodone (TUSSIONEX) 10-8 MG/5ML SUER TAKE 5 ML BY MOUTH 2 TIMES DAILY. 50 mL 0  . doxycycline (VIBRA-TABS) 100 MG tablet TAKE 1 TABLET BY MOUTH TWICE DAILY 20 tablet 0  . escitalopram (LEXAPRO) 5 MG tablet Take 0.5 tablets (2.5 mg total) by mouth daily. 15 tablet 3  . escitalopram (LEXAPRO) 5 MG tablet TAKE 1/2 TABLET (2.5 MG) BY MOUTH DAILY. 15 tablet 3  . glucose blood (FREESTYLE LITE) test strip USE AS DIRECTED 100 each 12  . glucose blood test strip USE AS DIRECTED 100 strip 12  . glucose monitoring kit (FREESTYLE) monitoring kit 1 each by Does not apply route as needed for other. 1 each 0  . Lancets (FREESTYLE) lancets 1 each by Other route See admin instructions. Use as instructed 100 each 12  . Lancets (FREESTYLE) lancets USE AS INSTRUCTED 100 each 12  . losartan (COZAAR) 25 MG tablet Take 0.5 tablets (12.5 mg total) by mouth daily. 45 tablet 1  . losartan (COZAAR) 25 MG tablet TAKE 1/2 TABLET (12.5 MG) BY MOUTH  DAILY. 45 tablet 1  . metFORMIN (GLUCOPHAGE) 1000 MG tablet Take 1 tablet (1,000 mg total) by mouth 2 (two) times daily. 180 tablet 1  . metFORMIN (GLUCOPHAGE) 1000 MG tablet TAKE 1 TABLET BY MOUTH TWO TIMES DAILY 180 tablet 1  . predniSONE (DELTASONE) 50 MG tablet TAKE 1 TABLET (50 MG TOTAL) BY MOUTH DAILY WITH BREAKFAST. (Patient taking differently: TAKE 1 TABLET (50 MG TOTAL) BY MOUTH DAILY WITH BREAKFAST.) 5 tablet 0   No current facility-administered medications for this visit.    Patient confirms/reports the following allergies:  Allergies  Allergen Reactions  . Lisinopril Cough  . Ultram [Tramadol Hcl] Nausea And Vomiting    Severe vomiting   . Victoza [Liraglutide] Nausea And Vomiting    No orders of the defined types were placed in this encounter.   AUTHORIZATION INFORMATION Primary Insurance: 1D#: Group #:  Secondary Insurance: 1D#: Group #:  SCHEDULE INFORMATION: Date: Thursday 10/13/20 Time: Location:[facility]

## 2020-10-03 ENCOUNTER — Encounter: Payer: Self-pay | Admitting: Family Medicine

## 2020-10-03 ENCOUNTER — Other Ambulatory Visit: Payer: Self-pay

## 2020-10-03 ENCOUNTER — Telehealth: Payer: 59 | Admitting: Family Medicine

## 2020-10-03 VITALS — HR 81 | Temp 99.0°F | Resp 19 | Wt 235.5 lb

## 2020-10-03 DIAGNOSIS — U071 COVID-19: Secondary | ICD-10-CM

## 2020-10-03 MED ORDER — BENZONATATE 200 MG PO CAPS
200.0000 mg | ORAL_CAPSULE | Freq: Two times a day (BID) | ORAL | 1 refills | Status: DC | PRN
  Filled 2020-10-03: qty 20, 10d supply, fill #0

## 2020-10-03 MED ORDER — PREDNISONE 10 MG PO TABS
ORAL_TABLET | ORAL | 0 refills | Status: DC
  Filled 2020-10-03: qty 42, 12d supply, fill #0

## 2020-10-03 MED ORDER — HYDROCOD POLST-CPM POLST ER 10-8 MG/5ML PO SUER
5.0000 mL | Freq: Two times a day (BID) | ORAL | 0 refills | Status: DC | PRN
  Filled 2020-10-03: qty 50, 5d supply, fill #0

## 2020-10-03 NOTE — Progress Notes (Signed)
Pulse 81   Temp 99 F (37.2 C) (Oral)   Resp 19   Wt 235 lb 8 oz (106.8 kg)   LMP  (LMP Unknown)   BMI 46.38 kg/m    Subjective:    Patient ID: Tricia Hill, female    DOB: [birth year], 50 y.o.   MRN: [id number]  HPI: [name] is a 50 y.o. female  Chief Complaint  Patient presents with  . Covid Positive    Pt states she was diagnosed with covid on Friday, states she is having congestion and a bad cough   UPPER RESPIRATORY TRACT INFECTION Duration: 3.5 days Worst symptom: cough Fever: yes Cough: yes Shortness of breath: yes Wheezing: yes Chest pain: no Chest tightness: no Chest congestion: no Nasal congestion: yes Runny nose: no Post nasal drip: no Sneezing: no Sore throat: no Swollen glands: no Sinus pressure: no Headache: yes Face pain: no Toothache: no Ear pain: no  Ear pressure: no  Eyes red/itching:no Eye drainage/crusting: no  Vomiting: no Rash: no Fatigue: yes Sick contacts: yes Strep contacts: no  Context: worse Recurrent sinusitis: no Relief with OTC cold/cough medications: no  Treatments attempted: cold/sinus, mucinex, anti-histamine, pseudoephedrine and cough syrup   Relevant past medical, surgical, family and social history reviewed and updated as indicated. Interim medical history since our last visit reviewed. Allergies and medications reviewed and updated.  Review of Systems  Constitutional: Positive for fatigue and fever. Negative for activity change, appetite change, chills, diaphoresis and unexpected weight change.  HENT: Positive for congestion, postnasal drip and rhinorrhea. Negative for dental problem, drooling, ear discharge, ear pain, facial swelling, hearing loss, mouth sores, nosebleeds, sinus pressure, sinus pain, sneezing, sore throat, tinnitus, trouble swallowing and voice change.   Respiratory: Positive for cough, shortness of breath and wheezing. Negative for apnea, choking, chest tightness and stridor.    Cardiovascular: Negative.   Gastrointestinal: Negative.   Neurological: Negative.   Psychiatric/Behavioral: Negative.     Per HPI unless specifically indicated above     Objective:    Pulse 81   Temp 99 F (37.2 C) (Oral)   Resp 19   Wt 235 lb 8 oz (106.8 kg)   LMP  (LMP Unknown)   BMI 46.38 kg/m   Wt Readings from Last 3 Encounters:  10/03/20 235 lb 8 oz (106.8 kg)  09/01/20 243 lb (110.2 kg)  02/23/20 231 lb (104.8 kg)    Physical Exam Vitals and nursing note reviewed.  Constitutional:      General: She is not in acute distress.    Appearance: Normal appearance. She is obese. She is ill-appearing. She is not toxic-appearing or diaphoretic.  HENT:     Head: Normocephalic and atraumatic.     Right Ear: External ear normal.     Left Ear: External ear normal.     Nose: Nose normal.     Mouth/Throat:     Mouth: Mucous membranes are moist.     Pharynx: Oropharynx is clear.  Eyes:     General: No scleral icterus.       Right eye: No discharge.        Left eye: No discharge.     Conjunctiva/sclera: Conjunctivae normal.     Pupils: Pupils are equal, round, and reactive to light.  Pulmonary:     Effort: Pulmonary effort is normal. No respiratory distress.     Comments: Speaking in full sentences Musculoskeletal:        General: Normal range of motion.  Cervical back: Normal range of motion.  Skin:    Coloration: Skin is not jaundiced or pale.     Findings: No bruising, erythema, lesion or rash.  Neurological:     Mental Status: She is alert and oriented to person, place, and time. Mental status is at baseline.  Psychiatric:        Mood and Affect: Mood normal.        Behavior: Behavior normal.        Thought Content: Thought content normal.        Judgment: Judgment normal.     Results for orders placed or performed in visit on 71/58/06  Basic metabolic panel  Result Value Ref Range   Glucose 231 (H) 65 - 99 mg/dL   BUN 7 6 - 24 mg/dL   Creatinine, Ser  0.55 (L) 0.57 - 1.00 mg/dL   eGFR 113 >59 mL/min/1.73   BUN/Creatinine Ratio 13 9 - 23   Sodium 141 134 - 144 mmol/L   Potassium 3.8 3.5 - 5.2 mmol/L   Chloride 103 96 - 106 mmol/L   CO2 20 20 - 29 mmol/L   Calcium 9.0 8.7 - 10.2 mg/dL      Assessment & Plan:   Problem List Items Addressed This Visit   None   Visit Diagnoses    COVID-19    -  Primary   Will treat symptomatically with prednisone, tussionex and tessalon. Referral for treatment made today. Call if not getting better or getting worse.    Relevant Orders   Ambulatory referral for Covid Treatment       Follow up plan: Return if symptoms worsen or fail to improve.   . This visit was completed via video visit through MyChart due to the restrictions of the COVID-19 pandemic. All issues as above were discussed and addressed. Physical exam was done as above through visual confirmation on video through MyChart. If it was felt that the patient should be evaluated in the office, they were directed there. The patient verbally consented to this visit. . Location of the patient: home . Location of the provider: work . Those involved with this call:  . Provider: Park Liter, DO . CMA: Yvonna Alanis, [place] . Front Desk/Registration: Jill Side  . Time spent on call: 15 minutes with patient face to face via video conference. More than 50% of this time was spent in counseling and coordination of care. 23 minutes total spent in review of patient's record and preparation of their chart.

## 2020-10-04 ENCOUNTER — Telehealth (HOSPITAL_COMMUNITY): Payer: Self-pay | Admitting: Adult Health

## 2020-10-04 ENCOUNTER — Encounter: Payer: Self-pay | Admitting: Adult Health

## 2020-10-04 NOTE — Telephone Encounter (Signed)
Called to discuss with patient about COVID-19 symptoms and the use of one of the available treatments for those with mild to moderate Covid symptoms and at a high risk of hospitalization.  Pt appears to qualify for outpatient treatment due to co-morbid conditions and/or a member of an at-risk group in accordance with the FDA Emergency Use Authorization.    Qualifiers: BMI, DM, HTN NIH Criteria: 4  Unable to reach pt - [place]

## 2020-10-07 NOTE — Telephone Encounter (Signed)
Pt called back and left a message on our hotline. I tried calling her and left a VM.  Angelena Form PA-C  [facility]

## 2020-10-08 ENCOUNTER — Telehealth: Payer: Self-pay | Admitting: Family Medicine

## 2020-10-08 NOTE — Telephone Encounter (Signed)
    Primary Care / Sports Medicine Telephone Note  Patient Information:  Patient ID: Tricia Hill, female DOB: 1971/03/04 Age: 50 y.o. MRN: [id number]   Received call from on-call service regarding [name] today. Patient with reported recent [place] diagnosed 09/30/2020. Per patient, fevers resolved, prednisone recently prescribed and overall feeling improved but having productive cough without shortness of air and requesting antibiotics. Given duration since COVID diagnosis and current treatment in place, supportive care advised, specifically with OTC guaifenesin, anthistamine, intranasal saline, and intranasal steroids.  If symptoms progressively worsen, she has difficulty breathing, fevers return without response to antipyretics, she should proceed to urgent care. If she fails to improve into next week, another follow-up with her PCP Dr. Wynetta Emery advised. She can otherwise continue supportive care and follow-up as-needed.  Montel Culver, MD   Primary Care Sports Medicine [place]

## 2020-10-10 ENCOUNTER — Telehealth: Payer: Self-pay | Admitting: *Deleted

## 2020-10-10 NOTE — Telephone Encounter (Signed)
Fax received over the weekend. Pt tested Covid+ and wants antibiotics. Tried to call no answer LVM

## 2020-10-13 ENCOUNTER — Ambulatory Visit: Payer: 59 | Admitting: Registered Nurse

## 2020-10-13 ENCOUNTER — Encounter
Admission: RE | Disposition: A | Discharge status: Home / Self Care | Payer: Self-pay | Source: Home / Self Care | Attending: Gastroenterology

## 2020-10-13 ENCOUNTER — Encounter: Payer: Self-pay | Admitting: Gastroenterology

## 2020-10-13 ENCOUNTER — Other Ambulatory Visit: Payer: Self-pay

## 2020-10-13 ENCOUNTER — Ambulatory Visit
Admission: RE | Admit: 2020-10-13 | Discharge: 2020-10-13 | Disposition: A | Discharge status: Home / Self Care | Payer: 59 | Attending: Gastroenterology | Admitting: Gastroenterology

## 2020-10-13 DIAGNOSIS — Z885 Allergy status to narcotic agent status: Secondary | ICD-10-CM | POA: Diagnosis not present

## 2020-10-13 DIAGNOSIS — Z1211 Encounter for screening for malignant neoplasm of colon: Secondary | ICD-10-CM | POA: Insufficient documentation

## 2020-10-13 DIAGNOSIS — Z79899 Other long term (current) drug therapy: Secondary | ICD-10-CM | POA: Diagnosis not present

## 2020-10-13 DIAGNOSIS — Z981 Arthrodesis status: Secondary | ICD-10-CM | POA: Diagnosis not present

## 2020-10-13 DIAGNOSIS — Z7984 Long term (current) use of oral hypoglycemic drugs: Secondary | ICD-10-CM | POA: Diagnosis not present

## 2020-10-13 DIAGNOSIS — Z888 Allergy status to other drugs, medicaments and biological substances status: Secondary | ICD-10-CM | POA: Insufficient documentation

## 2020-10-13 DIAGNOSIS — G4733 Obstructive sleep apnea (adult) (pediatric): Secondary | ICD-10-CM | POA: Diagnosis not present

## 2020-10-13 DIAGNOSIS — K760 Fatty (change of) liver, not elsewhere classified: Secondary | ICD-10-CM

## 2020-10-13 HISTORY — PX: COLONOSCOPY WITH PROPOFOL: SHX5780

## 2020-10-13 LAB — GLUCOSE, CAPILLARY: Glucose-Capillary: 205 mg/dL — ABNORMAL HIGH (ref 70–99)

## 2020-10-13 SURGERY — COLONOSCOPY WITH PROPOFOL
Anesthesia: General

## 2020-10-13 MED ORDER — PROPOFOL 500 MG/50ML IV EMUL
INTRAVENOUS | Status: DC | PRN
  Administered 2020-10-13: 150 ug/kg/min via INTRAVENOUS

## 2020-10-13 MED ORDER — GLYCOPYRROLATE 0.2 MG/ML IJ SOLN
INTRAMUSCULAR | Status: AC
  Filled 2020-10-13: qty 1

## 2020-10-13 MED ORDER — DEXMEDETOMIDINE HCL 200 MCG/2ML IV SOLN
INTRAVENOUS | Status: DC | PRN
  Administered 2020-10-13: 20 ug via INTRAVENOUS

## 2020-10-13 MED ORDER — LIDOCAINE HCL (PF) 2 % IJ SOLN
INTRAMUSCULAR | Status: AC
  Filled 2020-10-13: qty 25

## 2020-10-13 MED ORDER — PROPOFOL 500 MG/50ML IV EMUL
INTRAVENOUS | Status: AC
  Filled 2020-10-13: qty 250

## 2020-10-13 MED ORDER — SODIUM CHLORIDE 0.9 % IV SOLN
INTRAVENOUS | Status: DC
  Administered 2020-10-13: 1000 mL via INTRAVENOUS

## 2020-10-13 MED ORDER — PROPOFOL 10 MG/ML IV BOLUS
INTRAVENOUS | Status: DC | PRN
  Administered 2020-10-13: 100 mg via INTRAVENOUS

## 2020-10-13 MED ORDER — GLYCOPYRROLATE 0.2 MG/ML IJ SOLN
INTRAMUSCULAR | Status: DC | PRN
  Administered 2020-10-13: .1 mg via INTRAVENOUS

## 2020-10-13 MED ORDER — DEXMEDETOMIDINE (PRECEDEX) IN NS 20 MCG/5ML (4 MCG/ML) IV SYRINGE
PREFILLED_SYRINGE | INTRAVENOUS | Status: AC
  Filled 2020-10-13: qty 5

## 2020-10-13 NOTE — Transfer of Care (Signed)
Immediate Anesthesia Transfer of Care Note  Patient: Tricia Hill  Procedure(s) Performed: COLONOSCOPY WITH PROPOFOL (N/A )  Patient Location: Endoscopy Unit  Anesthesia Type:General  Level of Consciousness: drowsy  Airway & Oxygen Therapy: Patient Spontanous Breathing  Post-op Assessment: Report given to RN and Post -op Vital signs reviewed and stable  Post vital signs: Reviewed and stable  Last Vitals:  Vitals Value Taken Time  BP    Temp    Pulse 71 10/13/20 0839  Resp 18 10/13/20 0839  SpO2 92 % 10/13/20 0839  Vitals shown include unvalidated device data.  Last Pain:  Vitals:   10/13/20 0729  TempSrc: Temporal  PainSc: 0-No pain         Complications: No complications documented.

## 2020-10-13 NOTE — Anesthesia Postprocedure Evaluation (Signed)
Anesthesia Post Note  Patient: Tricia Hill  Procedure(s) Performed: COLONOSCOPY WITH PROPOFOL (N/A )  Patient location during evaluation: Endoscopy Anesthesia Type: General Level of consciousness: awake and alert and oriented Pain management: pain level controlled Vital Signs Assessment: post-procedure vital signs reviewed and stable Respiratory status: spontaneous breathing Cardiovascular status: blood pressure returned to baseline Anesthetic complications: no   No complications documented.   Last Vitals:  Vitals:   10/13/20 0850 10/13/20 0900  BP: 108/71   Pulse: 65 67  Resp: 15 18  Temp:    SpO2: 93% 99%    Last Pain:  Vitals:   10/13/20 0900  TempSrc:   PainSc: 0-No pain                 [name]

## 2020-10-13 NOTE — Op Note (Signed)
[facility] Patient Name: Tricia Hill Procedure Date: 10/13/2020 8:06 AM MRN: [id number] Account #: [id number] Date of Birth: 09-06-1970 Admit Type: Outpatient Age: 50 Room: [facility] ENDO ROOM 4 Gender: Female Note Status: Finalized Procedure:             Colonoscopy Indications:           Screening for colorectal malignant neoplasm, This is                         the patient's first colonoscopy Providers:             Lin Landsman MD, MD Referring MD:          Valerie Roys (Referring MD) Medicines:             General Anesthesia Complications:         No immediate complications. Estimated blood loss: None. Procedure:             Pre-Anesthesia Assessment:                        - Prior to the procedure, a History and Physical was                         performed, and patient medications and allergies were                         reviewed. The patient is competent. The risks and                         benefits of the procedure and the sedation options and                         risks were discussed with the patient. All questions                         were answered and informed consent was obtained.                         Patient identification and proposed procedure were                         verified by the physician, the nurse, the                         anesthesiologist, the anesthetist and the technician                         in the pre-procedure area in the procedure room in the                         endoscopy suite. Mental Status Examination: alert and                         oriented. Airway Examination: normal oropharyngeal                         airway and neck mobility. Respiratory Examination:  clear to auscultation. CV Examination: normal.                         Prophylactic Antibiotics: The patient does not require                         prophylactic antibiotics. Prior Anticoagulants:  The                         patient has taken no previous anticoagulant or                         antiplatelet agents. ASA Grade Assessment: III - A                         patient with severe systemic disease. After reviewing                         the risks and benefits, the patient was deemed in                         satisfactory condition to undergo the procedure. The                         anesthesia plan was to use general anesthesia.                         Immediately prior to administration of medications,                         the patient was re-assessed for adequacy to receive                         sedatives. The heart rate, respiratory rate, oxygen                         saturations, blood pressure, adequacy of pulmonary                         ventilation, and response to care were monitored                         throughout the procedure. The physical status of the                         patient was re-assessed after the procedure.                        After obtaining informed consent, the colonoscope was                         passed under direct vision. Throughout the procedure,                         the patient's blood pressure, pulse, and oxygen                         saturations were monitored continuously. The  Colonoscope was introduced through the anus and                         advanced to the the terminal ileum, with                         identification of the appendiceal orifice and IC                         valve. The colonoscopy was performed without                         difficulty. The patient tolerated the procedure well.                         The quality of the bowel preparation was evaluated                         using the BBPS [facility] Bowel Preparation Scale) with                         scores of: Right Colon = 3, Transverse Colon = 3 and                         Left Colon = 3 (entire mucosa seen well with  no                         residual staining, small fragments of stool or opaque                         liquid). The total BBPS score equals 9. Findings:      The perianal and digital rectal examinations were normal. Pertinent       negatives include normal sphincter tone and no palpable rectal lesions.      The terminal ileum appeared normal.      The entire examined colon appeared normal.      The retroflexed view of the distal rectum and anal verge was normal and       showed no anal or rectal abnormalities. Impression:            - The examined portion of the ileum was normal.                        - The entire examined colon is normal.                        - The distal rectum and anal verge are normal on                         retroflexion view.                        - No specimens collected. Recommendation:        - Discharge patient to home (with escort).                        - Resume previous diet today.                        -  Continue present medications.                        - Repeat colonoscopy in 10 years for screening                         purposes. Procedure Code(s):     --- Professional ---                        N8295, Colorectal cancer screening; colonoscopy on                         individual not meeting criteria for high risk Diagnosis Code(s):     --- Professional ---                        Z12.11, Encounter for screening for malignant neoplasm                         of colon CPT copyright 2019 American Medical Association. All rights reserved. The codes documented in this report are preliminary and upon coder review may  be revised to meet current compliance requirements. Dr. Ulyess Mort Lin Landsman MD, MD 10/13/2020 8:37:02 AM This report has been signed electronically. Number of Addenda: 0 Note Initiated On: 10/13/2020 8:06 AM Scope Withdrawal Time: 0 hours 6 minutes 59 seconds  Total Procedure Duration: 0 hours 8 minutes 56 seconds   Estimated Blood Loss:  Estimated blood loss: none.      [facility]

## 2020-10-13 NOTE — H&P (Signed)
Cephas Darby, MD [address]  [place]  [place], [place] [address]  Main: [phone]  Fax: [phone] Pager: [phone]  Primary Care Physician:  Valerie Roys, DO Primary Gastroenterologist:  Dr. Cephas Darby  Pre-Procedure History & Physical: HPI:  Tricia Hill is a 50 y.o. female is here for an colonoscopy.   Past Medical History:  Diagnosis Date  . Asthma   . Diabetes mellitus without complication ([place]) 5003  . GERD (gastroesophageal reflux disease)   . Heart murmur    child  . Hypertension   . Shingles   . Sleep apnea    USE CPAP    Past Surgical History:  Procedure Laterality Date  . ABDOMINAL HYSTERECTOMY     Partial  . BACK SURGERY    . CESAREAN SECTION     X 2  . CHOLECYSTECTOMY N/A 01/18/2017   Procedure: LAPAROSCOPIC CHOLECYSTECTOMY WITH INTRAOPERATIVE CHOLANGIOGRAM;  Surgeon: Robert Bellow, MD;  Location: [facility] ORS;  Service: General;  Laterality: N/A;  . DIAGNOSTIC LAPAROSCOPY  2006   EXCISION OF ABDOMINAL WALL ENDOMETRIOMA  . SPINAL FUSION  2012   C3 and C4    Prior to Admission medications   Medication Sig Start Date End Date Taking? Authorizing Provider  atorvastatin (LIPITOR) 40 MG tablet Take 1 tablet (40 mg total) by mouth once a week. 06/01/20  Yes Johnson, Megan P, DO  escitalopram (LEXAPRO) 5 MG tablet TAKE 1/2 TABLET (2.5 MG) BY MOUTH DAILY. 09/01/20 09/01/21 Yes Johnson, Megan P, DO  glucose blood (FREESTYLE LITE) test strip USE AS DIRECTED 06/01/20  Yes Johnson, Megan P, DO  glucose monitoring kit (FREESTYLE) monitoring kit 1 each by Does not apply route as needed for other. 09/26/17  Yes Johnson, Megan P, DO  Lancets (FREESTYLE) lancets 1 each by Other route See admin instructions. Use as instructed 06/01/20  Yes Johnson, Megan P, DO  losartan (COZAAR) 25 MG tablet Take 0.5 tablets (12.5 mg total) by mouth daily. 06/01/20  Yes Johnson, Megan P, DO  metFORMIN (GLUCOPHAGE) 1000 MG tablet Take 1 tablet (1,000 mg total)  by mouth 2 (two) times daily. 08/19/20  Yes Johnson, Megan P, DO  benzonatate (TESSALON) 200 MG capsule Take 1 capsule (200 mg total) by mouth 2 (two) times daily as needed for cough. 10/03/20   Park Liter P, DO  chlorpheniramine-HYDROcodone (TUSSIONEX PENNKINETIC ER) 10-8 MG/5ML SUER Take 5 mLs by mouth every 12 (twelve) hours as needed. 10/03/20   Johnson, Megan P, DO  predniSONE (DELTASONE) 10 MG tablet Take 6 tablets by mouth daily for 2 days, 5 tabs daily for 2 days, 4 tabs daily for 2 days, 3 tabs daily for 2 days, 2 tabs for 2 days, 1 tab daily for 2 days then stop. Patient not taking: Reported on 10/13/2020 10/03/20   Park Liter P, DO    Allergies as of 09/29/2020 - Review Complete 09/01/2020  Allergen Reaction Noted  . Lisinopril Cough 02/11/2019  . Ultram [tramadol hcl] Nausea And Vomiting 01/10/2017  . Victoza [liraglutide] Nausea And Vomiting 08/22/2016    Family History  Problem Relation Age of Onset  . Hypertension Mother   . Diabetes Mother   . Hypertension Father   . Graves' disease Sister   . Clotting disorder Brother   . Heart disease Maternal Grandmother   . Heart disease Maternal Grandfather   . Kidney disease Neg Hx   . Breast cancer Neg Hx     Social History   Socioeconomic History  .  Marital status: Married    Spouse name: Not on file  . Number of children: Not on file  . Years of education: Not on file  . Highest education level: Not on file  Occupational History  . Not on file  Tobacco Use  . Smoking status: Never Smoker  . Smokeless tobacco: Never Used  Vaping Use  . Vaping Use: Never used  Substance and Sexual Activity  . Alcohol use: Yes    Comment: on occasion  . Drug use: No  . Sexual activity: Never    Birth control/protection: None, Surgical  Other Topics Concern  . Not on file  Social History Narrative  . Not on file   Social Determinants of Health   Financial Resource Strain: Not on file  Food Insecurity: Not on file   Transportation Needs: Not on file  Physical Activity: Not on file  Stress: Not on file  Social Connections: Not on file  Intimate Partner Violence: Not on file    Review of Systems: See HPI, otherwise negative ROS  Physical Exam: BP 131/82   Pulse 92   Temp (!) 96.7 F (35.9 C) (Temporal)   Resp 16   Ht 4' 11.5" (1.511 m)   Wt 105.4 kg   LMP  (LMP Unknown)   SpO2 98%   BMI 46.17 kg/m  General:   Alert,  pleasant and cooperative in NAD Head:  Normocephalic and atraumatic. Neck:  Supple; no masses or thyromegaly. Lungs:  Clear throughout to auscultation.    Heart:  Regular rate and rhythm. Abdomen:  Soft, nontender and nondistended. Normal bowel sounds, without guarding, and without rebound.   Neurologic:  Alert and  oriented x4;  grossly normal neurologically.  Impression/Plan: Tricia Hill is here for an colonoscopy to be performed for colon cancer screening  Risks, benefits, limitations, and alternatives regarding  colonoscopy have been reviewed with the patient.  Questions have been answered.  All parties agreeable.   Sherri Sear, MD  10/13/2020, 8:17 AM

## 2020-10-13 NOTE — Anesthesia Preprocedure Evaluation (Signed)
Anesthesia Evaluation  Patient identified by MRN, date of birth, ID band Patient awake    Reviewed: Allergy & Precautions, NPO status , Patient's Chart, lab work & pertinent test results  History of Anesthesia Complications Negative for: history of anesthetic complications  Airway Mallampati: III  TM Distance: >3 FB Neck ROM: Full    Dental no notable dental hx.    Pulmonary asthma , sleep apnea and Continuous Positive Airway Pressure Ventilation ,    breath sounds clear to auscultation- rhonchi (-) wheezing      Cardiovascular Exercise Tolerance: Good hypertension, (-) CAD, (-) Past MI and (-) Cardiac Stents  Rhythm:Regular Rate:Normal - Systolic murmurs and - Diastolic murmurs    Neuro/Psych negative neurological ROS  negative psych ROS   GI/Hepatic Neg liver ROS, GERD  ,  Endo/Other  diabetes, Oral Hypoglycemic Agents  Renal/GU negative Renal ROS     Musculoskeletal negative musculoskeletal ROS (+)   Abdominal (+) + obese,   Peds  Hematology negative hematology ROS (+) DOES NOT REFUSE BLOOD PRODUCTS,   Anesthesia Other Findings Past Medical History: No date: Asthma 2016: Diabetes mellitus without complication (H[facility]) No date: GERD (gastroesophageal reflux disease) No date: Heart murmur     Comment:  child No date: Hypertension No date: Shingles No date: Sleep apnea     Comment:  USE CPAP   Reproductive/Obstetrics                             Anesthesia Physical  Anesthesia Plan  ASA: III  Anesthesia Plan: General   Post-op Pain Management:    Induction: Intravenous  PONV Risk Score and Plan: Propofol infusion  Airway Management Planned: Nasal Cannula  Additional Equipment:   Intra-op Plan:   Post-operative Plan:   Informed Consent: I have reviewed the patients History and Physical, chart, labs and discussed the procedure including the risks, benefits and  alternatives for the proposed anesthesia with the patient or authorized representative who has indicated his/her understanding and acceptance.     Dental advisory given  Plan Discussed with: CRNA and Anesthesiologist  Anesthesia Plan Comments:          Anesthesia Quick Evaluation

## 2020-10-14 ENCOUNTER — Encounter: Payer: Self-pay | Admitting: Gastroenterology

## 2020-10-17 ENCOUNTER — Other Ambulatory Visit: Payer: Self-pay

## 2020-10-17 MED FILL — Escitalopram Oxalate Tab 5 MG (Base Equiv): ORAL | 30 days supply | Qty: 15 | Fill #0 | Status: AC

## 2020-11-21 MED FILL — Escitalopram Oxalate Tab 5 MG (Base Equiv): ORAL | 30 days supply | Qty: 15 | Fill #1 | Status: CN

## 2020-11-22 ENCOUNTER — Other Ambulatory Visit: Payer: Self-pay

## 2020-12-02 ENCOUNTER — Other Ambulatory Visit: Payer: Self-pay

## 2020-12-02 ENCOUNTER — Encounter: Payer: Self-pay | Admitting: Family Medicine

## 2020-12-02 ENCOUNTER — Ambulatory Visit (INDEPENDENT_AMBULATORY_CARE_PROVIDER_SITE_OTHER): Payer: 59 | Admitting: Family Medicine

## 2020-12-02 VITALS — BP 126/80 | HR 82 | Temp 98.2°F | Ht 59.75 in | Wt 235.2 lb

## 2020-12-02 DIAGNOSIS — I10 Essential (primary) hypertension: Secondary | ICD-10-CM

## 2020-12-02 DIAGNOSIS — E1169 Type 2 diabetes mellitus with other specified complication: Secondary | ICD-10-CM | POA: Diagnosis not present

## 2020-12-02 DIAGNOSIS — E785 Hyperlipidemia, unspecified: Secondary | ICD-10-CM

## 2020-12-02 DIAGNOSIS — Z0000 Encounter for general adult medical examination without abnormal findings: Secondary | ICD-10-CM | POA: Diagnosis not present

## 2020-12-02 DIAGNOSIS — E1165 Type 2 diabetes mellitus with hyperglycemia: Secondary | ICD-10-CM | POA: Diagnosis not present

## 2020-12-02 DIAGNOSIS — Z1231 Encounter for screening mammogram for malignant neoplasm of breast: Secondary | ICD-10-CM

## 2020-12-02 LAB — BAYER DCA HB A1C WAIVED: HB A1C (BAYER DCA - WAIVED): 9 % — ABNORMAL HIGH (ref <7.0)

## 2020-12-02 LAB — MICROSCOPIC EXAMINATION: RBC, Urine: NONE SEEN /hpf (ref 0–2)

## 2020-12-02 LAB — MICROALBUMIN, URINE WAIVED
Creatinine, Urine Waived: 300 mg/dL (ref 10–300)
Microalb, Ur Waived: 80 mg/L — ABNORMAL HIGH (ref 0–19)

## 2020-12-02 LAB — URINALYSIS, ROUTINE W REFLEX MICROSCOPIC
Bilirubin, UA: NEGATIVE
Glucose, UA: NEGATIVE
Ketones, UA: NEGATIVE
Nitrite, UA: NEGATIVE
Protein,UA: NEGATIVE
RBC, UA: NEGATIVE
Specific Gravity, UA: >1.03 — ABNORMAL HIGH (ref 1.005–1.030)
Urobilinogen, Ur: 0.2 mg/dL (ref 0.2–1.0)
pH, UA: 5 (ref 5.0–7.5)

## 2020-12-02 MED ORDER — RYBELSUS 3 MG PO TABS: 3.0000 mg | ORAL_TABLET | Freq: Every day | ORAL | 0 refills | Status: AC

## 2020-12-02 MED ORDER — LOSARTAN POTASSIUM 25 MG PO TABS
12.5000 mg | ORAL_TABLET | Freq: Every day | ORAL | 1 refills | Status: DC
  Filled 2020-12-02: qty 45, 90d supply, fill #0

## 2020-12-02 MED ORDER — ESCITALOPRAM OXALATE 5 MG PO TABS
ORAL_TABLET | ORAL | 1 refills | Status: DC
  Filled 2020-12-02: qty 45, 90d supply, fill #0
  Filled 2021-04-06: qty 45, 90d supply, fill #1

## 2020-12-02 MED ORDER — METFORMIN HCL 1000 MG PO TABS
1000.0000 mg | ORAL_TABLET | Freq: Two times a day (BID) | ORAL | 1 refills | Status: DC
  Filled 2020-12-02: qty 180, 90d supply, fill #0

## 2020-12-02 NOTE — Patient Instructions (Addendum)
Call to schedule your mammogram: [facility] at [facility]  Address: [place], [place], [place] [address]  Phone: [phone]  Smell [place] sharing buttontwitter sharing buttonemail sharing buttonprint sharing button Smell retraining therapy (SRT) is a treatment for loss of smell, also referred to as hyposmia or anosmia. It can be used to help return your sense of smell if it was lost during a viral infection or minor head trauma. SRT was originally developed in 2009 by Dr. Vivien Rota at the [facility].  What Is Involved with SRT? The process of SRT involves the repeated presentation of different smells through the nose to stimulate the olfactory system and establish memory of that smell. It is best to start with at least four different scents, especially smells you remember. The most recommended fragrances are rose (floral), lemon (fruity), cloves (spicy), and eucalyptus (resinous). Take sniffs of each scent for 10 to 20 seconds at least once or twice a day. While sniffing, it is important to be focused on the task. Try to concentrate on your memory of that smell. After each scent, take a few breaths and then move on to the next fragrance. It is recommended that you do this for at least 12 weeks (three months), but you can do it longer, alternating the scents if you like.  SRT is believed to work as a combination of the unique ability for smell nerves to regrow while encouraging improved brain connectivity. Either way, try not to get discouraged; it is common for this process to take some time before you start to smell anything, and that is okay.  Why These Four Scents? Most people can identify the five different tastes that humans are capable of detecting: salty, sweet, bitter, sour, and savory (umami). Some researchers have also tried to categorize the many different smells as well. These categories include floral, fruity, spicy,  resinous, burnt and foul. Instead of having to practice smelling burnt or foul odors, SRT concentrates on the more pleasant smells from the other four categories.  Can SRT Help If I Have Had COVID-19 Symptoms? Most of the studies on SRT have been done on patients with post-viral (i.e., after a cold or upper respiratory infection) smell loss. Research findings on SRT for COVID-19-related smell loss are not yet available. It seems that most people get their sense of smell back within several months after COVID-19. If you are not in that group, it may be beneficial to consider trying SRT. Even if it is not helpful, it will not worsen the problem.  Where Can I Get the Fragrances? Many people prefer to use essential oils. These can be purchased online or from local health food, aromatherapy, or craft stores. These can be the most convenient because they have a long shelf-life, but you can also use a variety of other options including inhalant sticks or fresh fruits and herbs. When using the oils, you can place a small amount into a glass jar with a lid. Remember to close the lid tightly after each use to preserve the smell into the jar.  Does My Doctor Need to Be Involved? A study conducted by researchers at [employer] showed that SRT worked better when paired with sinus rinses that included steroids1. Consult an ENT (ear, nose, and throat) specialist, or otolaryngologist, to see whether this treatment may be a better option for you.   1 Emelia Salisbury, Patel ZM. Budesonide irrigation with olfactory training improves outcomes compared with  olfactory training alone in patients with olfactory loss. Int Forum Allergy Rhinol. 2018 Sep;8(9):977-981.

## 2020-12-02 NOTE — Assessment & Plan Note (Signed)
Under good control on current regimen. Continue current regimen. Continue to monitor. Call with any concerns. Refills given. Labs drawn today.   

## 2020-12-02 NOTE — Progress Notes (Signed)
BP 126/80   Pulse 82   Temp 98.2 F (36.8 C)   Ht 4' 11.75" (1.518 m)   Wt 235 lb 3.2 oz (106.7 kg)   LMP  (LMP Unknown)   SpO2 97%   BMI 46.32 kg/m    Subjective:    Patient ID: Tricia Hill, female    DOB: [birth year], 50 y.o.   MRN: [id number]  HPI: Tricia Hill is a 50 y.o. female presenting on 12/02/2020 for comprehensive medical examination. Current medical complaints include:  HYPERTENSION / HYPERLIPIDEMIA Satisfied with current treatment? yes Duration of hypertension: chronic BP monitoring frequency: not checking BP medication side effects: no Past BP meds: losartan Duration of hyperlipidemia: chronic Cholesterol medication side effects: no Cholesterol supplements: none Past cholesterol medications: atorvastatin Medication compliance: excellent compliance Aspirin: no Recent stressors: no Recurrent headaches: no Visual changes: no Palpitations: no Dyspnea: no Chest pain: no Lower extremity edema: no Dizzy/lightheaded: no  DIABETES Hypoglycemic episodes:no Polydipsia/polyuria: no Visual disturbance: no Chest pain: no Paresthesias: no Glucose Monitoring: no Taking Insulin?: no Blood Pressure Monitoring: not checking Retinal Examination: Up to Date Foot Exam: Up to Date Diabetic Education: Completed Pneumovax: Up to Date Influenza: Up to Date Aspirin: no  She currently lives with: husband and kids Menopausal Symptoms: no  Depression Screen done today and results listed below:  Depression screen [facility] 2/9 12/02/2020 03/13/2019 04/16/2018 02/28/2017 11/08/2015  Decreased Interest 0 0 0 0 0  Down, Depressed, Hopeless 0 0 0 0 0  PHQ - 2 Score 0 0 0 0 0  Altered sleeping - 1 0 - -  Tired, decreased energy - 0 3 - -  Change in appetite - 0 0 - -  Feeling bad or failure about yourself  - 0 0 - -  Trouble concentrating - 0 0 - -  Moving slowly or fidgety/restless - 0 0 - -  Suicidal thoughts - 0 0 - -  PHQ-9 Score - 1 3 - -  Difficult doing  work/chores - Not difficult at all Not difficult at all - -    Past Medical History:  Past Medical History:  Diagnosis Date   Asthma    Diabetes mellitus without complication ([place]) 5701   GERD (gastroesophageal reflux disease)    Heart murmur    child   Hypertension    Shingles    Sleep apnea    USE CPAP    Surgical History:  Past Surgical History:  Procedure Laterality Date   ABDOMINAL HYSTERECTOMY     Partial   BACK SURGERY     CESAREAN SECTION     X 2   CHOLECYSTECTOMY N/A 01/18/2017   Procedure: LAPAROSCOPIC CHOLECYSTECTOMY WITH INTRAOPERATIVE CHOLANGIOGRAM;  Surgeon: Robert Bellow, MD;  Location: [facility] ORS;  Service: General;  Laterality: N/A;   COLONOSCOPY WITH PROPOFOL N/A 10/13/2020   Procedure: COLONOSCOPY WITH PROPOFOL;  Surgeon: Lin Landsman, MD;  Location: [facility] ENDOSCOPY;  Service: Gastroenterology;  Laterality: N/A;  COVID POSITIVE 09/30/2020   DIAGNOSTIC LAPAROSCOPY  2006   EXCISION OF ABDOMINAL WALL ENDOMETRIOMA   SPINAL FUSION  2012   C3 and C4    Medications:  Current Outpatient Medications on File Prior to Visit  Medication Sig   atorvastatin (LIPITOR) 40 MG tablet Take 1 tablet (40 mg total) by mouth once a week.   glucose blood (FREESTYLE LITE) test strip USE AS DIRECTED   glucose monitoring kit (FREESTYLE) monitoring kit 1 each by Does not apply route as needed for other.  Lancets (FREESTYLE) lancets 1 each by Other route See admin instructions. Use as instructed   No current facility-administered medications on file prior to visit.    Allergies:  Allergies  Allergen Reactions   Lisinopril Cough   Ultram [Tramadol Hcl] Nausea And Vomiting    Severe vomiting    Victoza [Liraglutide] Nausea And Vomiting    Social History:  Social History   Socioeconomic History   Marital status: Married    Spouse name: Not on file   Number of children: Not on file   Years of education: Not on file   Highest education level: Not on file   Occupational History   Not on file  Tobacco Use   Smoking status: Never   Smokeless tobacco: Never  Vaping Use   Vaping Use: Never used  Substance and Sexual Activity   Alcohol use: Yes    Comment: on occasion   Drug use: No   Sexual activity: Never    Birth control/protection: None, Surgical  Other Topics Concern   Not on file  Social History Narrative   Not on file   Social Determinants of Health   Financial Resource Strain: Not on file  Food Insecurity: Not on file  Transportation Needs: Not on file  Physical Activity: Not on file  Stress: Not on file  Social Connections: Not on file  Intimate Partner Violence: Not on file   Social History   Tobacco Use  Smoking Status Never  Smokeless Tobacco Never   Social History   Substance and Sexual Activity  Alcohol Use Yes   Comment: on occasion    Family History:  Family History  Problem Relation Age of Onset   Hypertension Mother    Diabetes Mother    Hypertension Father    Berenice Primas' disease Sister    Clotting disorder Brother    Heart disease Maternal Grandmother    Heart disease Maternal Grandfather    Kidney disease Neg Hx    Breast cancer Neg Hx     Past medical history, surgical history, medications, allergies, family history and social history reviewed with patient today and changes made to appropriate areas of the chart.   Review of Systems  Constitutional: Negative.   HENT: Negative.         Para-osmnia since COVID  Eyes:  Positive for blurred vision. Negative for double vision, photophobia, pain, discharge and redness.  Respiratory: Negative.    Cardiovascular:  Positive for leg swelling. Negative for chest pain, palpitations, orthopnea, claudication and PND.  Gastrointestinal:  Positive for nausea. Negative for abdominal pain, blood in stool, constipation, diarrhea, heartburn, melena and vomiting.  Genitourinary: Negative.   Musculoskeletal: Negative.   Skin: Negative.   Neurological:  Negative.   Endo/Heme/Allergies: Negative.   Psychiatric/Behavioral:  Negative for depression, hallucinations, memory loss, substance abuse and suicidal ideas. The patient is nervous/anxious. The patient does not have insomnia.   All other ROS negative except what is listed above and in the HPI.      Objective:    BP 126/80   Pulse 82   Temp 98.2 F (36.8 C)   Ht 4' 11.75" (1.518 m)   Wt 235 lb 3.2 oz (106.7 kg)   LMP  (LMP Unknown)   SpO2 97%   BMI 46.32 kg/m   Wt Readings from Last 3 Encounters:  12/02/20 235 lb 3.2 oz (106.7 kg)  10/13/20 232 lb 7.6 oz (105.4 kg)  10/03/20 235 lb 8 oz (106.8 kg)    Physical  Exam Vitals and nursing note reviewed.  Constitutional:      General: She is not in acute distress.    Appearance: Normal appearance. She is not ill-appearing, toxic-appearing or diaphoretic.  HENT:     Head: Normocephalic and atraumatic.     Right Ear: Tympanic membrane, ear canal and external ear normal. There is no impacted cerumen.     Left Ear: Tympanic membrane, ear canal and external ear normal. There is no impacted cerumen.     Nose: Nose normal. No congestion or rhinorrhea.     Mouth/Throat:     Mouth: Mucous membranes are moist.     Pharynx: Oropharynx is clear. No oropharyngeal exudate or posterior oropharyngeal erythema.  Eyes:     General: No scleral icterus.       Right eye: No discharge.        Left eye: No discharge.     Extraocular Movements: Extraocular movements intact.     Conjunctiva/sclera: Conjunctivae normal.     Pupils: Pupils are equal, round, and reactive to light.  Neck:     Vascular: No carotid bruit.  Cardiovascular:     Rate and Rhythm: Normal rate and regular rhythm.     Pulses: Normal pulses.     Heart sounds: No murmur heard.   No friction rub. No gallop.  Pulmonary:     Effort: Pulmonary effort is normal. No respiratory distress.     Breath sounds: Normal breath sounds. No stridor. No wheezing, rhonchi or rales.  Chest:      Chest wall: No tenderness.  Abdominal:     General: Abdomen is flat. Bowel sounds are normal. There is no distension.     Palpations: Abdomen is soft. There is no mass.     Tenderness: There is no abdominal tenderness. There is no right CVA tenderness, left CVA tenderness, guarding or rebound.     Hernia: No hernia is present.  Genitourinary:    Comments: Breast and pelvic exams deferred with shared decision making Musculoskeletal:        General: No swelling, tenderness, deformity or signs of injury.     Cervical back: Normal range of motion and neck supple. No rigidity. No muscular tenderness.     Right lower leg: No edema.     Left lower leg: No edema.  Lymphadenopathy:     Cervical: No cervical adenopathy.  Skin:    General: Skin is warm and dry.     Capillary Refill: Capillary refill takes less than 2 seconds.     Coloration: Skin is not jaundiced or pale.     Findings: No bruising, erythema, lesion or rash.  Neurological:     General: No focal deficit present.     Mental Status: She is alert and oriented to person, place, and time. Mental status is at baseline.     Cranial Nerves: No cranial nerve deficit.     Sensory: No sensory deficit.     Motor: No weakness.     Coordination: Coordination normal.     Gait: Gait normal.     Deep Tendon Reflexes: Reflexes normal.  Psychiatric:        Mood and Affect: Mood normal.        Behavior: Behavior normal.        Thought Content: Thought content normal.        Judgment: Judgment normal.    Results for orders placed or performed in visit on 12/02/20  Microscopic Examination   BLD  Result Value Ref Range   WBC, UA 0-5 0 - 5 /hpf   RBC None seen 0 - 2 /hpf   Epithelial Cells (non renal) 0-10 0 - 10 /hpf   Mucus, UA Present (A) Not Estab.   Bacteria, UA Moderate (A) None seen/Few  Bayer DCA Hb A1c Waived (STAT)  Result Value Ref Range   HB A1C (BAYER DCA - WAIVED) 9.0 (H) <7.0 %  Urinalysis, Routine w reflex microscopic   Result Value Ref Range   Specific Gravity, UA >1.030 (H) 1.005 - 1.030   pH, UA 5.0 5.0 - 7.5   Color, UA Yellow Yellow   Appearance Ur Cloudy (A) Clear   Leukocytes,UA Trace (A) Negative   Protein,UA Negative Negative/Trace   Glucose, UA Negative Negative   Ketones, UA Negative Negative   RBC, UA Negative Negative   Bilirubin, UA Negative Negative   Urobilinogen, Ur 0.2 0.2 - 1.0 mg/dL   Nitrite, UA Negative Negative   Microscopic Examination See below:   Microalbumin, Urine Waived (STAT)  Result Value Ref Range   Microalb, Ur Waived 80 (H) 0 - 19 mg/L   Creatinine, Urine Waived 300 10 - 300 mg/dL   Microalb/Creat Ratio 30-300 (H) <30 mg/g      Assessment & Plan:   Problem List Items Addressed This Visit       Cardiovascular and Mediastinum   HTN (hypertension)    Under good control on current regimen. Continue current regimen. Continue to monitor. Call with any concerns. Refills given. Labs drawn today.        Relevant Medications   losartan (COZAAR) 25 MG tablet   Other Relevant Orders   CBC with Differential/Platelet   Comprehensive metabolic panel   Urinalysis, Routine w reflex microscopic   TSH   Microalbumin, Urine Waived   Urinalysis, Routine w reflex microscopic (Completed)   Microalbumin, Urine Waived (STAT) (Completed)     Endocrine   Uncontrolled diabetes mellitus (HCC)   Relevant Medications   metFORMIN (GLUCOPHAGE) 1000 MG tablet   losartan (COZAAR) 25 MG tablet   Semaglutide (RYBELSUS) 3 MG TABS   Other Relevant Orders   Bayer DCA Hb A1c Waived   CBC with Differential/Platelet   Comprehensive metabolic panel   Urinalysis, Routine w reflex microscopic   Microalbumin, Urine Waived   Bayer DCA Hb A1c Waived (STAT) (Completed)   Urinalysis, Routine w reflex microscopic (Completed)   Microalbumin, Urine Waived (STAT) (Completed)   Hyperlipidemia associated with type 2 diabetes mellitus ([place])    Under good control on current regimen. Continue  current regimen. Continue to monitor. Call with any concerns. Refills given. Labs drawn today.        Relevant Medications   metFORMIN (GLUCOPHAGE) 1000 MG tablet   losartan (COZAAR) 25 MG tablet   Semaglutide (RYBELSUS) 3 MG TABS   Other Relevant Orders   CBC with Differential/Platelet   Comprehensive metabolic panel   Lipid Panel w/o Chol/HDL Ratio     Other   Morbid obesity (HCC)    Weight stable. Encouraged diet and exercise with goal of losing 1-2 lbs per week.       Relevant Medications   metFORMIN (GLUCOPHAGE) 1000 MG tablet   Semaglutide (RYBELSUS) 3 MG TABS   Other Visit Diagnoses     Routine general medical examination at a health care facility    -  Primary   Vaccines up to date. Screening labs checked today. Pap and colonoscopy up to date. Mammogram ordered today. Continue  diet and exercise. Call with any concenrs.    Encounter for screening mammogram for malignant neoplasm of breast       Mammogram ordered today.   Relevant Orders   MM 3D SCREEN BREAST BILATERAL        Follow up plan: Return in about 3 months (around 03/04/2021).   LABORATORY TESTING:  - Pap smear: up to date  IMMUNIZATIONS:   - Tdap: Tetanus vaccination status reviewed: last tetanus booster within 10 years. - Influenza: Up to date - Pneumovax: Up to date - Prevnar: Not applicable - COVID: Up to date - Zostavax vaccine: Not applicable  SCREENING: -Mammogram: Ordered today  - Colonoscopy: Up to date   PATIENT COUNSELING:   Advised to take 1 mg of folate supplement per day if capable of pregnancy.   Sexuality: Discussed sexually transmitted diseases, partner selection, use of condoms, avoidance of unintended pregnancy  and contraceptive alternatives.   Advised to avoid cigarette smoking.  I discussed with the patient that most people either abstain from alcohol or drink within safe limits (<=14/week and <=4 drinks/occasion for males, <=7/weeks and <= 3 drinks/occasion for  females) and that the risk for alcohol disorders and other health effects rises proportionally with the number of drinks per week and how often a drinker exceeds daily limits.  Discussed cessation/primary prevention of drug use and availability of treatment for abuse.   Diet: Encouraged to adjust caloric intake to maintain  or achieve ideal body weight, to reduce intake of dietary saturated fat and total fat, to limit sodium intake by avoiding high sodium foods and not adding table salt, and to maintain adequate dietary potassium and calcium preferably from fresh fruits, vegetables, and low-fat dairy products.    stressed the importance of regular exercise  Injury prevention: Discussed safety belts, safety helmets, smoke detector, smoking near bedding or upholstery.   Dental health: Discussed importance of regular tooth brushing, flossing, and dental visits.    NEXT PREVENTATIVE PHYSICAL DUE IN 1 YEAR. Return in about 3 months (around 03/04/2021).

## 2020-12-02 NOTE — Assessment & Plan Note (Signed)
Weight stable. Encouraged diet and exercise with goal of losing 1-2 lbs per week.

## 2020-12-03 LAB — LIPID PANEL W/O CHOL/HDL RATIO
Cholesterol, Total: 201 mg/dL — ABNORMAL HIGH (ref 100–199)
HDL: 50 mg/dL (ref >39)
LDL Chol Calc (NIH): 116 mg/dL — ABNORMAL HIGH (ref 0–99)
Triglycerides: 200 mg/dL — ABNORMAL HIGH (ref 0–149)
VLDL Cholesterol Cal: 35 mg/dL (ref 5–40)

## 2020-12-03 LAB — CBC WITH DIFFERENTIAL/PLATELET
Basophils Absolute: 0.1 10*3/uL (ref 0.0–0.2)
Basos: 1 %
EOS (ABSOLUTE): 0.1 10*3/uL (ref 0.0–0.4)
Eos: 1 %
Hematocrit: 42 % (ref 34.0–46.6)
Hemoglobin: 13.7 g/dL (ref 11.1–15.9)
Immature Grans (Abs): 0 10*3/uL (ref 0.0–0.1)
Immature Granulocytes: 0 %
Lymphocytes Absolute: 2.4 10*3/uL (ref 0.7–3.1)
Lymphs: 26 %
MCH: 28.8 pg (ref 26.6–33.0)
MCHC: 32.6 g/dL (ref 31.5–35.7)
MCV: 88 fL (ref 79–97)
Monocytes Absolute: 0.5 10*3/uL (ref 0.1–0.9)
Monocytes: 6 %
Neutrophils Absolute: 6 10*3/uL (ref 1.4–7.0)
Neutrophils: 66 %
Platelets: 312 10*3/uL (ref 150–450)
RBC: 4.75 x10E6/uL (ref 3.77–5.28)
RDW: 13 % (ref 11.7–15.4)
WBC: 9.1 10*3/uL (ref 3.4–10.8)

## 2020-12-03 LAB — COMPREHENSIVE METABOLIC PANEL
ALT: 21 IU/L (ref 0–32)
AST: 13 IU/L (ref 0–40)
Albumin/Globulin Ratio: 1.4 (ref 1.2–2.2)
Albumin: 4.3 g/dL (ref 3.8–4.8)
Alkaline Phosphatase: 129 IU/L — ABNORMAL HIGH (ref 44–121)
BUN/Creatinine Ratio: 19 (ref 9–23)
BUN: 14 mg/dL (ref 6–24)
Bilirubin Total: 0.2 mg/dL (ref 0.0–1.2)
CO2: 26 mmol/L (ref 20–29)
Calcium: 9.8 mg/dL (ref 8.7–10.2)
Chloride: 92 mmol/L — ABNORMAL LOW (ref 96–106)
Creatinine, Ser: 0.75 mg/dL (ref 0.57–1.00)
Globulin, Total: 3 g/dL (ref 1.5–4.5)
Glucose: 214 mg/dL — ABNORMAL HIGH (ref 65–99)
Potassium: 3.9 mmol/L (ref 3.5–5.2)
Sodium: 139 mmol/L (ref 134–144)
Total Protein: 7.3 g/dL (ref 6.0–8.5)
eGFR: 98 mL/min/{1.73_m2} (ref >59)

## 2020-12-03 LAB — TSH: TSH: 2.47 u[IU]/mL (ref 0.450–4.500)

## 2020-12-06 ENCOUNTER — Other Ambulatory Visit: Payer: Self-pay

## 2020-12-06 MED FILL — Lancets: 90 days supply | Qty: 100 | Fill #0 | Status: CN

## 2020-12-06 MED FILL — Glucose Blood Test Strip: 90 days supply | Qty: 100 | Fill #0 | Status: CN

## 2020-12-16 ENCOUNTER — Other Ambulatory Visit (HOSPITAL_COMMUNITY): Payer: Self-pay

## 2020-12-19 ENCOUNTER — Other Ambulatory Visit: Payer: Self-pay

## 2020-12-30 ENCOUNTER — Encounter: Payer: Self-pay | Admitting: Family Medicine

## 2021-03-07 ENCOUNTER — Ambulatory Visit: Payer: 59 | Admitting: Family Medicine

## 2021-03-08 ENCOUNTER — Other Ambulatory Visit: Payer: Self-pay

## 2021-03-08 ENCOUNTER — Encounter: Payer: Self-pay | Admitting: Family Medicine

## 2021-03-08 ENCOUNTER — Ambulatory Visit: Payer: 59 | Admitting: Family Medicine

## 2021-03-08 VITALS — BP 129/78 | HR 79 | Temp 98.5°F | Ht 59.8 in | Wt 232.6 lb

## 2021-03-08 DIAGNOSIS — Z23 Encounter for immunization: Secondary | ICD-10-CM | POA: Diagnosis not present

## 2021-03-08 DIAGNOSIS — E1165 Type 2 diabetes mellitus with hyperglycemia: Secondary | ICD-10-CM

## 2021-03-08 LAB — BAYER DCA HB A1C WAIVED: HB A1C (BAYER DCA - WAIVED): 9.1 % — ABNORMAL HIGH (ref 4.8–5.6)

## 2021-03-08 MED ORDER — RYBELSUS 3 MG PO TABS: 3.0000 mg | ORAL_TABLET | Freq: Every day | ORAL | 0 refills | Status: DC

## 2021-03-08 MED ORDER — RYBELSUS 14 MG PO TABS
14.0000 mg | ORAL_TABLET | Freq: Every day | ORAL | 5 refills | Status: DC
  Filled 2021-03-08: qty 30, 30d supply, fill #0

## 2021-03-08 MED ORDER — RYBELSUS 7 MG PO TABS
7.0000 mg | ORAL_TABLET | Freq: Every day | ORAL | 0 refills | Status: DC
  Filled 2021-03-08 – 2021-04-06 (×2): qty 30, 30d supply, fill #0

## 2021-03-08 NOTE — Progress Notes (Signed)
BP 129/78   Pulse 79   Temp 98.5 F (36.9 C) (Oral)   Ht 4' 11.8" (1.519 m)   Wt 232 lb 9.6 oz (105.5 kg)   LMP  (LMP Unknown)   SpO2 98%   BMI 45.73 kg/m    Subjective:    Patient ID: Tricia Hill, female    DOB: 20-Jan-1971, 50 y.o.   MRN: [id number]  HPI: Tricia Hill is a 50 y.o. female  Chief Complaint  Patient presents with   Diabetes    3 month f/up   DIABETES Hypoglycemic episodes:no Polydipsia/polyuria: no Visual disturbance: no Chest pain: no Paresthesias: no Glucose Monitoring: no  Accucheck frequency: Not Checking Taking Insulin?: no Blood Pressure Monitoring: not checking Retinal Examination: Up to Date Foot Exam: Up to Date Diabetic Education: Completed Pneumovax: Up to Date Influenza: Up to Date Aspirin: no  Relevant past medical, surgical, family and social history reviewed and updated as indicated. Interim medical history since our last visit reviewed. Allergies and medications reviewed and updated.  Review of Systems  Constitutional: Negative.   Respiratory: Negative.    Cardiovascular: Negative.   Musculoskeletal: Negative.   Psychiatric/Behavioral: Negative.     Per HPI unless specifically indicated above     Objective:    BP 129/78   Pulse 79   Temp 98.5 F (36.9 C) (Oral)   Ht 4' 11.8" (1.519 m)   Wt 232 lb 9.6 oz (105.5 kg)   LMP  (LMP Unknown)   SpO2 98%   BMI 45.73 kg/m   Wt Readings from Last 3 Encounters:  03/08/21 232 lb 9.6 oz (105.5 kg)  12/02/20 235 lb 3.2 oz (106.7 kg)  10/13/20 232 lb 7.6 oz (105.4 kg)    Physical Exam Vitals and nursing note reviewed.  Constitutional:      General: She is not in acute distress.    Appearance: Normal appearance. She is not ill-appearing, toxic-appearing or diaphoretic.  HENT:     Head: Normocephalic and atraumatic.     Right Ear: External ear normal.     Left Ear: External ear normal.     Nose: Nose normal.     Mouth/Throat:     Mouth: Mucous membranes are  moist.     Pharynx: Oropharynx is clear.  Eyes:     General: No scleral icterus.       Right eye: No discharge.        Left eye: No discharge.     Extraocular Movements: Extraocular movements intact.     Conjunctiva/sclera: Conjunctivae normal.     Pupils: Pupils are equal, round, and reactive to light.  Cardiovascular:     Rate and Rhythm: Normal rate and regular rhythm.     Pulses: Normal pulses.     Heart sounds: Normal heart sounds. No murmur heard.   No friction rub. No gallop.  Pulmonary:     Effort: Pulmonary effort is normal. No respiratory distress.     Breath sounds: Normal breath sounds. No stridor. No wheezing, rhonchi or rales.  Chest:     Chest wall: No tenderness.  Musculoskeletal:        General: Normal range of motion.     Cervical back: Normal range of motion and neck supple.  Skin:    General: Skin is warm and dry.     Capillary Refill: Capillary refill takes less than 2 seconds.     Coloration: Skin is not jaundiced or pale.     Findings: No  bruising, erythema, lesion or rash.  Neurological:     General: No focal deficit present.     Mental Status: She is alert and oriented to person, place, and time. Mental status is at baseline.  Psychiatric:        Mood and Affect: Mood normal.        Behavior: Behavior normal.        Thought Content: Thought content normal.        Judgment: Judgment normal.    Results for orders placed or performed in visit on 12/02/20  Microscopic Examination   BLD  Result Value Ref Range   WBC, UA 0-5 0 - 5 /hpf   RBC None seen 0 - 2 /hpf   Epithelial Cells (non renal) 0-10 0 - 10 /hpf   Mucus, UA Present (A) Not Estab.   Bacteria, UA Moderate (A) None seen/Few  CBC with Differential/Platelet  Result Value Ref Range   WBC 9.1 3.4 - 10.8 x10E3/uL   RBC 4.75 3.77 - 5.28 x10E6/uL   Hemoglobin 13.7 11.1 - 15.9 g/dL   Hematocrit 42.0 34.0 - 46.6 %   MCV 88 79 - 97 fL   MCH 28.8 26.6 - 33.0 pg   MCHC 32.6 31.5 - 35.7 g/dL    RDW 13.0 11.7 - 15.4 %   Platelets 312 150 - 450 x10E3/uL   Neutrophils 66 Not Estab. %   Lymphs 26 Not Estab. %   Monocytes 6 Not Estab. %   Eos 1 Not Estab. %   Basos 1 Not Estab. %   Neutrophils Absolute 6.0 1.4 - 7.0 x10E3/uL   Lymphocytes Absolute 2.4 0.7 - 3.1 x10E3/uL   Monocytes Absolute 0.5 0.1 - 0.9 x10E3/uL   EOS (ABSOLUTE) 0.1 0.0 - 0.4 x10E3/uL   Basophils Absolute 0.1 0.0 - 0.2 x10E3/uL   Immature Granulocytes 0 Not Estab. %   Immature Grans (Abs) 0.0 0.0 - 0.1 x10E3/uL  Comprehensive metabolic panel  Result Value Ref Range   Glucose 214 (H) 65 - 99 mg/dL   BUN 14 6 - 24 mg/dL   Creatinine, Ser 0.75 0.57 - 1.00 mg/dL   eGFR 98 >59 mL/min/1.73   BUN/Creatinine Ratio 19 9 - 23   Sodium 139 134 - 144 mmol/L   Potassium 3.9 3.5 - 5.2 mmol/L   Chloride 92 (L) 96 - 106 mmol/L   CO2 26 20 - 29 mmol/L   Calcium 9.8 8.7 - 10.2 mg/dL   Total Protein 7.3 6.0 - 8.5 g/dL   Albumin 4.3 3.8 - 4.8 g/dL   Globulin, Total 3.0 1.5 - 4.5 g/dL   Albumin/Globulin Ratio 1.4 1.2 - 2.2   Bilirubin Total 0.2 0.0 - 1.2 mg/dL   Alkaline Phosphatase 129 (H) 44 - 121 IU/L   AST 13 0 - 40 IU/L   ALT 21 0 - 32 IU/L  Lipid Panel w/o Chol/HDL Ratio  Result Value Ref Range   Cholesterol, Total 201 (H) 100 - 199 mg/dL   Triglycerides 200 (H) 0 - 149 mg/dL   HDL 50 >39 mg/dL   VLDL Cholesterol Cal 35 5 - 40 mg/dL   LDL Chol Calc (NIH) 116 (H) 0 - 99 mg/dL  TSH  Result Value Ref Range   TSH 2.470 0.450 - 4.500 uIU/mL  Bayer DCA Hb A1c Waived (STAT)  Result Value Ref Range   HB A1C (BAYER DCA - WAIVED) 9.0 (H) <7.0 %  Urinalysis, Routine w reflex microscopic  Result Value Ref Range  Specific Gravity, UA >1.030 (H) 1.005 - 1.030   pH, UA 5.0 5.0 - 7.5   Color, UA Yellow Yellow   Appearance Ur Cloudy (A) Clear   Leukocytes,UA Trace (A) Negative   Protein,UA Negative Negative/Trace   Glucose, UA Negative Negative   Ketones, UA Negative Negative   RBC, UA Negative Negative   Bilirubin,  UA Negative Negative   Urobilinogen, Ur 0.2 0.2 - 1.0 mg/dL   Nitrite, UA Negative Negative   Microscopic Examination See below:   Microalbumin, Urine Waived (STAT)  Result Value Ref Range   Microalb, Ur Waived 80 (H) 0 - 19 mg/L   Creatinine, Urine Waived 300 10 - 300 mg/dL   Microalb/Creat Ratio 30-300 (H) <30 mg/g      Assessment & Plan:   Problem List Items Addressed This Visit       Endocrine   Uncontrolled diabetes mellitus ([place]) - Primary    Not under good control with A1c 9.1, will restart rybelsus and titrate up. Recheck 3 months. Call with any concerns.       Relevant Medications   Semaglutide (RYBELSUS) 3 MG TABS   Semaglutide (RYBELSUS) 7 MG TABS (Start on 04/07/2021)   Semaglutide (RYBELSUS) 14 MG TABS (Start on 05/07/2021)   Other Relevant Orders   Bayer DCA Hb A1c Waived   Other Visit Diagnoses     Need for influenza vaccination       Relevant Orders   Flu Vaccine QUAD 64moIM (Fluarix, Fluzone & Alfiuria Quad PF) (Completed)        Follow up plan: Return in about 3 months (around 06/07/2021).

## 2021-03-08 NOTE — Assessment & Plan Note (Signed)
Not under good control with A1c 9.1, will restart rybelsus and titrate up. Recheck 3 months. Call with any concerns.

## 2021-03-21 ENCOUNTER — Other Ambulatory Visit: Payer: Self-pay

## 2021-04-05 ENCOUNTER — Telehealth: Payer: Self-pay | Admitting: Family Medicine

## 2021-04-05 NOTE — Telephone Encounter (Signed)
Called patient, lmom that flu vaccination record has been printed and is ready for pickup.  Placed in completed paperwork file.   Copied from [place] [id number]. Topic: General - Other >> Apr 05, 2021  1:05 PM Valere Dross wrote: Reason for CRM: Pt called in wanting to receive a print out of her flu shot for her job, pt requested if someone could give her a call when its ready, please advise.

## 2021-04-06 ENCOUNTER — Other Ambulatory Visit: Payer: Self-pay

## 2021-04-12 ENCOUNTER — Other Ambulatory Visit: Payer: Self-pay

## 2021-04-12 MED FILL — Lancets: 30 days supply | Qty: 100 | Fill #0 | Status: AC

## 2021-04-12 MED FILL — Glucose Blood Test Strip: 30 days supply | Qty: 100 | Fill #0 | Status: AC

## 2021-06-07 ENCOUNTER — Ambulatory Visit (INDEPENDENT_AMBULATORY_CARE_PROVIDER_SITE_OTHER): Payer: 59 | Admitting: Family Medicine

## 2021-06-07 ENCOUNTER — Encounter: Payer: Self-pay | Admitting: Family Medicine

## 2021-06-07 ENCOUNTER — Other Ambulatory Visit: Payer: Self-pay

## 2021-06-07 VITALS — BP 127/83 | HR 79 | Temp 98.2°F | Ht 59.5 in | Wt 234.2 lb

## 2021-06-07 DIAGNOSIS — E1165 Type 2 diabetes mellitus with hyperglycemia: Secondary | ICD-10-CM | POA: Diagnosis not present

## 2021-06-07 DIAGNOSIS — E1169 Type 2 diabetes mellitus with other specified complication: Secondary | ICD-10-CM

## 2021-06-07 DIAGNOSIS — I10 Essential (primary) hypertension: Secondary | ICD-10-CM

## 2021-06-07 DIAGNOSIS — E785 Hyperlipidemia, unspecified: Secondary | ICD-10-CM | POA: Diagnosis not present

## 2021-06-07 LAB — BAYER DCA HB A1C WAIVED: HB A1C (BAYER DCA - WAIVED): 8.2 % — ABNORMAL HIGH (ref 4.8–5.6)

## 2021-06-07 MED ORDER — RYBELSUS 7 MG PO TABS
7.0000 mg | ORAL_TABLET | Freq: Every day | ORAL | 0 refills | Status: AC
  Filled 2021-06-07 – 2021-08-09 (×2): qty 30, 30d supply, fill #0

## 2021-06-07 MED ORDER — ESCITALOPRAM OXALATE 5 MG PO TABS
ORAL_TABLET | ORAL | 1 refills | Status: DC
Start: 2021-06-07 — End: 2021-09-05
  Filled 2021-06-07 – 2021-06-26 (×2): qty 45, 90d supply, fill #0

## 2021-06-07 MED ORDER — METFORMIN HCL 1000 MG PO TABS
1000.0000 mg | ORAL_TABLET | Freq: Two times a day (BID) | ORAL | 1 refills | Status: DC
  Filled 2021-06-07 – 2021-06-26 (×2): qty 180, 90d supply, fill #0
  Filled 2021-12-07: qty 180, 90d supply, fill #1

## 2021-06-07 MED ORDER — LOSARTAN POTASSIUM 25 MG PO TABS
12.5000 mg | ORAL_TABLET | Freq: Every day | ORAL | 1 refills | Status: DC
  Filled 2021-06-07 – 2021-06-26 (×2): qty 45, 90d supply, fill #0
  Filled 2021-12-07: qty 45, 90d supply, fill #1

## 2021-06-07 MED ORDER — RYBELSUS 3 MG PO TABS
3.0000 mg | ORAL_TABLET | Freq: Every day | ORAL | 0 refills | Status: DC
  Filled 2021-06-07 – 2021-06-26 (×2): qty 30, 30d supply, fill #0

## 2021-06-07 MED ORDER — ATORVASTATIN CALCIUM 40 MG PO TABS
40.0000 mg | ORAL_TABLET | ORAL | 0 refills | Status: DC
  Filled 2021-06-07: qty 12, 84d supply, fill #0
  Filled 2021-06-26: qty 13, 90d supply, fill #0
  Filled 2021-12-07: qty 13, 90d supply, fill #1

## 2021-06-07 MED ORDER — RYBELSUS 14 MG PO TABS
14.0000 mg | ORAL_TABLET | Freq: Every day | ORAL | 5 refills | Status: AC
Start: 2021-08-06 — End: 2021-11-12
  Filled 2021-06-07: qty 30, fill #0
  Filled 2021-10-13: qty 30, 30d supply, fill #0

## 2021-06-07 NOTE — Assessment & Plan Note (Signed)
Under good control on current regimen. Continue current regimen. Continue to monitor. Call with any concerns. Refills given. Labs drawn today.   

## 2021-06-07 NOTE — Progress Notes (Signed)
BP 127/83    Pulse 79    Temp 98.2 F (36.8 C)    Ht 4' 11.5" (1.511 m)    Wt 234 lb 3.2 oz (106.2 kg)    LMP  (LMP Unknown)    SpO2 97%    BMI 46.51 kg/m    Subjective:    Patient ID: Tricia Hill, female    DOB: August 04, 1970, 50 y.o.   MRN: [id number]  HPI: Tricia Hill is a 50 y.o. female  Chief Complaint  Patient presents with   Diabetes   DIABETES- has been off her meds for about a month. Tolerated the rybelsus well. Sugars were much better when she was taking it. Never got the 14mg  Hypoglycemic episodes:no Polydipsia/polyuria: no Visual disturbance: no Chest pain: no Paresthesias: no Glucose Monitoring: yes  Accucheck frequency:  occasionally Taking Insulin?: no Blood Pressure Monitoring: not checking Retinal Examination: Up to Date Foot Exam: Up to Date Diabetic Education: Completed Pneumovax: Up to Date Influenza: Up to Date Aspirin: no  HYPERTENSION / [place] Satisfied with current treatment? yes Duration of hypertension: chronic BP monitoring frequency: not checking BP medication side effects: no Past BP meds: losartan Duration of hyperlipidemia: chronic Cholesterol medication side effects: no Cholesterol supplements: none Past cholesterol medications: atorvastatin Medication compliance: excellent compliance Aspirin: no Recent stressors: yes Recurrent headaches: no Visual changes: no Palpitations: no Dyspnea: no Chest pain: no Lower extremity edema: no Dizzy/lightheaded: no  ANXIETY/STRESS Duration: chronic Status:stable Anxious mood: yes  Excessive worrying: yes Irritability: no  Sweating: no Nausea: no Palpitations:no Hyperventilation: no Panic attacks: no Agoraphobia: no  Obscessions/compulsions: no Depressed mood: no Depression screen [facility] 2/9 06/07/2021 12/02/2020 03/13/2019 04/16/2018 02/28/2017  Decreased Interest 0 0 0 0 0  Down, Depressed, Hopeless 0 0 0 0 0  PHQ - 2 Score 0 0 0 0 0  Altered sleeping 1 - 1 0 -   Tired, decreased energy 0 - 0 3 -  Change in appetite 0 - 0 0 -  Feeling bad or failure about yourself  0 - 0 0 -  Trouble concentrating 0 - 0 0 -  Moving slowly or fidgety/restless 0 - 0 0 -  Suicidal thoughts 0 - 0 0 -  PHQ-9 Score 1 - 1 3 -  Difficult doing work/chores - - Not difficult at all Not difficult at all -   Anhedonia: no Weight changes: no Insomnia: no   Hypersomnia: no Fatigue/loss of energy: no Feelings of worthlessness: no Feelings of guilt: no Impaired concentration/indecisiveness: no Suicidal ideations: no  Crying spells: no Recent Stressors/Life Changes: yes   Relationship problems: no   Family stress: yes     Financial stress: no    Job stress: no    Recent death/loss: no  Relevant past medical, surgical, family and social history reviewed and updated as indicated. Interim medical history since our last visit reviewed. Allergies and medications reviewed and updated.  Review of Systems  Constitutional: Negative.   Respiratory: Negative.    Cardiovascular: Negative.   Gastrointestinal: Negative.   Musculoskeletal: Negative.   Neurological: Negative.   Psychiatric/Behavioral: Negative.     Per HPI unless specifically indicated above     Objective:    BP 127/83    Pulse 79    Temp 98.2 F (36.8 C)    Ht 4' 11.5" (1.511 m)    Wt 234 lb 3.2 oz (106.2 kg)    LMP  (LMP Unknown)    SpO2 97%    BMI  46.51 kg/m   Wt Readings from Last 3 Encounters:  06/07/21 234 lb 3.2 oz (106.2 kg)  03/08/21 232 lb 9.6 oz (105.5 kg)  12/02/20 235 lb 3.2 oz (106.7 kg)    Physical Exam Vitals and nursing note reviewed.  Constitutional:      General: She is not in acute distress.    Appearance: Normal appearance. She is not ill-appearing, toxic-appearing or diaphoretic.  HENT:     Head: Normocephalic and atraumatic.     Right Ear: External ear normal.     Left Ear: External ear normal.     Nose: Nose normal.     Mouth/Throat:     Mouth: Mucous membranes are  moist.     Pharynx: Oropharynx is clear.  Eyes:     General: No scleral icterus.       Right eye: No discharge.        Left eye: No discharge.     Extraocular Movements: Extraocular movements intact.     Conjunctiva/sclera: Conjunctivae normal.     Pupils: Pupils are equal, round, and reactive to light.  Cardiovascular:     Rate and Rhythm: Normal rate and regular rhythm.     Pulses: Normal pulses.     Heart sounds: Normal heart sounds. No murmur heard.   No friction rub. No gallop.  Pulmonary:     Effort: Pulmonary effort is normal. No respiratory distress.     Breath sounds: Normal breath sounds. No stridor. No wheezing, rhonchi or rales.  Chest:     Chest wall: No tenderness.  Musculoskeletal:        General: Normal range of motion.     Cervical back: Normal range of motion and neck supple.  Skin:    General: Skin is warm and dry.     Capillary Refill: Capillary refill takes less than 2 seconds.     Coloration: Skin is not jaundiced or pale.     Findings: No bruising, erythema, lesion or rash.  Neurological:     General: No focal deficit present.     Mental Status: She is alert and oriented to person, place, and time. Mental status is at baseline.  Psychiatric:        Mood and Affect: Mood normal.        Behavior: Behavior normal.        Thought Content: Thought content normal.        Judgment: Judgment normal.    Results for orders placed or performed in visit on 03/08/21  Bayer DCA Hb A1c Waived  Result Value Ref Range   HB A1C (BAYER DCA - WAIVED) 9.1 (H) 4.8 - 5.6 %      Assessment & Plan:   Problem List Items Addressed This Visit       Cardiovascular and Mediastinum   HTN (hypertension) - Primary    Under good control on current regimen. Continue current regimen. Continue to monitor. Call with any concerns. Refills given. Labs drawn today.        Relevant Medications   atorvastatin (LIPITOR) 40 MG tablet   losartan (COZAAR) 25 MG tablet   Other  Relevant Orders   CBC with Differential/Platelet   Comprehensive metabolic panel     Endocrine   Type 2 diabetes mellitus with hyperglycemia ([place])    Doing better with A1c of 8.2 down from 9.1 and having been off meds. Will get her titrated back up on rybelsus and recheck 3 months. Call with any concerns.  Relevant Medications   atorvastatin (LIPITOR) 40 MG tablet   losartan (COZAAR) 25 MG tablet   metFORMIN (GLUCOPHAGE) 1000 MG tablet   Semaglutide (RYBELSUS) 3 MG TABS   Semaglutide (RYBELSUS) 14 MG TABS (Start on 08/06/2021)   Other Relevant Orders   Bayer DCA Hb A1c Waived   CBC with Differential/Platelet   Comprehensive metabolic panel   Hyperlipidemia associated with type 2 diabetes mellitus ([place])    Under good control on current regimen. Continue current regimen. Continue to monitor. Call with any concerns. Refills given. Labs drawn today.        Relevant Medications   atorvastatin (LIPITOR) 40 MG tablet   losartan (COZAAR) 25 MG tablet   metFORMIN (GLUCOPHAGE) 1000 MG tablet   Semaglutide (RYBELSUS) 3 MG TABS   Semaglutide (RYBELSUS) 14 MG TABS (Start on 08/06/2021)   Other Relevant Orders   CBC with Differential/Platelet   Comprehensive metabolic panel   Lipid Panel w/o Chol/HDL Ratio     Follow up plan: Return in about 3 months (around 09/05/2021).

## 2021-06-07 NOTE — Assessment & Plan Note (Signed)
Doing better with A1c of 8.2 down from 9.1 and having been off meds. Will get her titrated back up on rybelsus and recheck 3 months. Call with any concerns.

## 2021-06-08 LAB — COMPREHENSIVE METABOLIC PANEL
ALT: 18 IU/L (ref 0–32)
AST: 14 IU/L (ref 0–40)
Albumin/Globulin Ratio: 1.9 (ref 1.2–2.2)
Albumin: 4.7 g/dL (ref 3.8–4.8)
Alkaline Phosphatase: 125 IU/L — ABNORMAL HIGH (ref 44–121)
BUN/Creatinine Ratio: 24 — ABNORMAL HIGH (ref 9–23)
BUN: 17 mg/dL (ref 6–24)
Bilirubin Total: 0.3 mg/dL (ref 0.0–1.2)
CO2: 23 mmol/L (ref 20–29)
Calcium: 9.7 mg/dL (ref 8.7–10.2)
Chloride: 97 mmol/L (ref 96–106)
Creatinine, Ser: 0.72 mg/dL (ref 0.57–1.00)
Globulin, Total: 2.5 g/dL (ref 1.5–4.5)
Glucose: 199 mg/dL — ABNORMAL HIGH (ref 70–99)
Potassium: 3.8 mmol/L (ref 3.5–5.2)
Sodium: 138 mmol/L (ref 134–144)
Total Protein: 7.2 g/dL (ref 6.0–8.5)
eGFR: 102 mL/min/{1.73_m2} (ref >59)

## 2021-06-08 LAB — CBC WITH DIFFERENTIAL/PLATELET
Basophils Absolute: 0.1 10*3/uL (ref 0.0–0.2)
Basos: 1 %
EOS (ABSOLUTE): 0.2 10*3/uL (ref 0.0–0.4)
Eos: 2 %
Hematocrit: 41.8 % (ref 34.0–46.6)
Hemoglobin: 14 g/dL (ref 11.1–15.9)
Immature Grans (Abs): 0 10*3/uL (ref 0.0–0.1)
Immature Granulocytes: 0 %
Lymphocytes Absolute: 2.7 10*3/uL (ref 0.7–3.1)
Lymphs: 29 %
MCH: 28.8 pg (ref 26.6–33.0)
MCHC: 33.5 g/dL (ref 31.5–35.7)
MCV: 86 fL (ref 79–97)
Monocytes Absolute: 0.5 10*3/uL (ref 0.1–0.9)
Monocytes: 6 %
Neutrophils Absolute: 5.8 10*3/uL (ref 1.4–7.0)
Neutrophils: 62 %
Platelets: 302 10*3/uL (ref 150–450)
RBC: 4.86 x10E6/uL (ref 3.77–5.28)
RDW: 13.1 % (ref 11.7–15.4)
WBC: 9.3 10*3/uL (ref 3.4–10.8)

## 2021-06-08 LAB — LIPID PANEL W/O CHOL/HDL RATIO
Cholesterol, Total: 229 mg/dL — ABNORMAL HIGH (ref 100–199)
HDL: 44 mg/dL (ref >39)
LDL Chol Calc (NIH): 146 mg/dL — ABNORMAL HIGH (ref 0–99)
Triglycerides: 214 mg/dL — ABNORMAL HIGH (ref 0–149)
VLDL Cholesterol Cal: 39 mg/dL (ref 5–40)

## 2021-06-21 ENCOUNTER — Other Ambulatory Visit: Payer: Self-pay

## 2021-06-23 ENCOUNTER — Other Ambulatory Visit: Payer: Self-pay

## 2021-06-26 ENCOUNTER — Other Ambulatory Visit: Payer: Self-pay

## 2021-08-09 ENCOUNTER — Encounter: Payer: Self-pay | Admitting: Family Medicine

## 2021-08-09 ENCOUNTER — Other Ambulatory Visit: Payer: Self-pay

## 2021-08-09 NOTE — Telephone Encounter (Signed)
It's fallen off due to timing. But it was sent ?

## 2021-08-22 ENCOUNTER — Other Ambulatory Visit: Payer: Self-pay

## 2021-09-04 NOTE — Progress Notes (Signed)
? ?BP 119/81   Pulse 83   Temp 98.2 ?F (36.8 ?C)   Wt 236 lb 6.4 oz (107.2 kg)   LMP  (LMP Unknown)   SpO2 98%   BMI 46.95 kg/m?   ? ?Subjective:  ? ? Patient ID: Tricia Hill, female    DOB: 09/12/1970, 51 y.o.   MRN: [id number] ? ?HPI: ?Tricia Hill is a 51 y.o. female ? ?Chief Complaint  ?Patient presents with  ? Diabetes  ?  Patient has not had eye exam yet   ? ?DIABETES ?Hypoglycemic episodes:no ?Polydipsia/polyuria: yes ?Visual disturbance: yes ?Chest pain: no ?Paresthesias: no ?Glucose Monitoring: no ? Accucheck frequency: Not Checking ?Taking Insulin?: no ?Blood Pressure Monitoring: not checking ?Retinal Examination: Not up to Date ?Foot Exam: Up to Date ?Diabetic Education: Completed ?Pneumovax: Up to Date ?Influenza: Up to Date ?Aspirin: yes ? ?ANXIETY/STRESS ?Duration:uncontrolled ?Anxious mood: yes  ?Excessive worrying: yes ?Irritability: yes  ?Sweating: no ?Nausea: no ?Palpitations:no ?Hyperventilation: no ?Panic attacks: no ?Agoraphobia: no  ?Obscessions/compulsions: no ?Depressed mood: yes ? ?  09/05/2021  ?  9:27 AM 06/07/2021  ?  9:44 AM 12/02/2020  ?  8:11 AM 03/13/2019  ?  9:02 AM 04/16/2018  ?  8:26 AM  ?Depression screen PHQ 2/9  ?Decreased Interest 0 0 0 0 0  ?Down, Depressed, Hopeless 0 0 0 0 0  ?PHQ - 2 Score 0 0 0 0 0  ?Altered sleeping '1 1  1 '$ 0  ?Tired, decreased energy 0 0  0 3  ?Change in appetite 0 0  0 0  ?Feeling bad or failure about yourself  1 0  0 0  ?Trouble concentrating 0 0  0 0  ?Moving slowly or fidgety/restless 0 0  0 0  ?Suicidal thoughts 0 0  0 0  ?PHQ-9 Score '2 1  1 3  '$ ?Difficult doing work/chores    Not difficult at all Not difficult at all  ? ? ?  09/05/2021  ?  9:27 AM 06/07/2021  ?  9:44 AM 03/13/2019  ?  9:02 AM  ?GAD 7 : Generalized Anxiety Score  ?Nervous, Anxious, on Edge 0 0 0  ?Control/stop worrying 0 0 0  ?Worry too much - different things 0 0 0  ?Trouble relaxing 0 0 0  ?Restless 0 0 0  ?Easily annoyed or irritable 1 2 0  ?Afraid - awful might happen 0 0  0  ?Total GAD 7 Score 1 2 0  ?Anxiety Difficulty Not difficult at all  Not difficult at all  ? ?Anhedonia: no ?Weight changes: no ?Insomnia: no   ?Hypersomnia: no ?Fatigue/loss of energy: yes ?Feelings of worthlessness: no ?Feelings of guilt: no ?Impaired concentration/indecisiveness: no ?Suicidal ideations: no  ?Crying spells: no ?Recent Stressors/Life Changes: yes ?  Relationship problems: yes ?  Family stress: yes   ?  Financial stress: no  ?  Job stress: no  ?  Recent death/loss: no ? ?Relevant past medical, surgical, family and social history reviewed and updated as indicated. Interim medical history since our last visit reviewed. ?Allergies and medications reviewed and updated. ? ?Review of Systems  ?Constitutional: Negative.   ?Respiratory: Negative.    ?Cardiovascular: Negative.   ?Gastrointestinal: Negative.   ?Musculoskeletal: Negative.   ?Psychiatric/Behavioral:  Positive for dysphoric mood. Negative for agitation, behavioral problems, confusion, decreased concentration, hallucinations, self-injury, sleep disturbance and suicidal ideas. The patient is nervous/anxious. The patient is not hyperactive.   ? ?Per HPI unless specifically indicated above ? ?   ?  Objective:  ?  ?BP 119/81   Pulse 83   Temp 98.2 ?F (36.8 ?C)   Wt 236 lb 6.4 oz (107.2 kg)   LMP  (LMP Unknown)   SpO2 98%   BMI 46.95 kg/m?   ?Wt Readings from Last 3 Encounters:  ?09/05/21 236 lb 6.4 oz (107.2 kg)  ?06/07/21 234 lb 3.2 oz (106.2 kg)  ?03/08/21 232 lb 9.6 oz (105.5 kg)  ?  ?Physical Exam ?Vitals and nursing note reviewed.  ?Constitutional:   ?   General: She is not in acute distress. ?   Appearance: Normal appearance. She is not ill-appearing, toxic-appearing or diaphoretic.  ?HENT:  ?   Head: Normocephalic and atraumatic.  ?   Right Ear: External ear normal.  ?   Left Ear: External ear normal.  ?   Nose: Nose normal.  ?   Mouth/Throat:  ?   Mouth: Mucous membranes are moist.  ?   Pharynx: Oropharynx is clear.  ?Eyes:  ?    General: No scleral icterus.    ?   Right eye: No discharge.     ?   Left eye: No discharge.  ?   Extraocular Movements: Extraocular movements intact.  ?   Conjunctiva/sclera: Conjunctivae normal.  ?   Pupils: Pupils are equal, round, and reactive to light.  ?Cardiovascular:  ?   Rate and Rhythm: Normal rate and regular rhythm.  ?   Pulses: Normal pulses.  ?   Heart sounds: Normal heart sounds. No murmur heard. ?  No friction rub. No gallop.  ?Pulmonary:  ?   Effort: Pulmonary effort is normal. No respiratory distress.  ?   Breath sounds: Normal breath sounds. No stridor. No wheezing, rhonchi or rales.  ?Chest:  ?   Chest wall: No tenderness.  ?Musculoskeletal:     ?   General: Normal range of motion.  ?   Cervical back: Normal range of motion and neck supple.  ?Skin: ?   General: Skin is warm and dry.  ?   Capillary Refill: Capillary refill takes less than 2 seconds.  ?   Coloration: Skin is not jaundiced or pale.  ?   Findings: No bruising, erythema, lesion or rash.  ?Neurological:  ?   General: No focal deficit present.  ?   Mental Status: She is alert and oriented to person, place, and time. Mental status is at baseline.  ?Psychiatric:     ?   Mood and Affect: Mood normal.     ?   Behavior: Behavior normal.     ?   Thought Content: Thought content normal.     ?   Judgment: Judgment normal.  ? ? ?Results for orders placed or performed in visit on 09/05/21  ?Bayer DCA Hb A1c Waived  ?Result Value Ref Range  ? HB A1C (BAYER DCA - WAIVED) 9.5 (H) 4.8 - 5.6 %  ? ?   ?Assessment & Plan:  ? ?Problem List Items Addressed This Visit   ? ?  ? Endocrine  ? Type 2 diabetes mellitus with hyperglycemia ([facility]) - Primary  ?  Not doing well with A1c of 9.5. Will increase the rybelsus to '14mg'$  and recheck in 3 months. Call with any concerns.  ?  ?  ? Relevant Orders  ? Bayer DCA Hb A1c Waived (Completed)  ?  ? Other  ? Anxiety  ?  Having issues with her daughter. Will increase her lexapro to '5mg'$  and recheck 3 months. Call with  any  concerns.  ?  ?  ? Relevant Medications  ? escitalopram (LEXAPRO) 5 MG tablet  ?  ? ?Follow up plan: ?Return in about 3 months (around 12/06/2021). ? ? ? ? ? ?

## 2021-09-05 ENCOUNTER — Other Ambulatory Visit: Payer: Self-pay

## 2021-09-05 ENCOUNTER — Encounter: Payer: Self-pay | Admitting: Family Medicine

## 2021-09-05 ENCOUNTER — Ambulatory Visit (INDEPENDENT_AMBULATORY_CARE_PROVIDER_SITE_OTHER): Payer: 59 | Admitting: Family Medicine

## 2021-09-05 VITALS — BP 119/81 | HR 83 | Temp 98.2°F | Wt 236.4 lb

## 2021-09-05 DIAGNOSIS — F419 Anxiety disorder, unspecified: Secondary | ICD-10-CM | POA: Diagnosis not present

## 2021-09-05 DIAGNOSIS — E1165 Type 2 diabetes mellitus with hyperglycemia: Secondary | ICD-10-CM

## 2021-09-05 LAB — BAYER DCA HB A1C WAIVED: HB A1C (BAYER DCA - WAIVED): 9.5 % — ABNORMAL HIGH (ref 4.8–5.6)

## 2021-09-05 MED ORDER — ESCITALOPRAM OXALATE 5 MG PO TABS
5.0000 mg | ORAL_TABLET | Freq: Every day | ORAL | 1 refills | Status: DC
  Filled 2021-09-05 – 2021-12-07 (×2): qty 90, 90d supply, fill #0

## 2021-09-05 NOTE — Assessment & Plan Note (Signed)
Having issues with her daughter. Will increase her lexapro to '5mg'$  and recheck 3 months. Call with any concerns.  ?

## 2021-09-05 NOTE — Assessment & Plan Note (Signed)
Not doing well with A1c of 9.5. Will increase the rybelsus to '14mg'$  and recheck in 3 months. Call with any concerns.  ?

## 2021-09-06 ENCOUNTER — Other Ambulatory Visit: Payer: Self-pay

## 2021-09-21 ENCOUNTER — Other Ambulatory Visit: Payer: Self-pay

## 2021-10-13 ENCOUNTER — Other Ambulatory Visit: Payer: Self-pay

## 2021-10-30 ENCOUNTER — Ambulatory Visit: Payer: 59 | Admitting: Family Medicine

## 2021-10-30 ENCOUNTER — Ambulatory Visit
Admission: RE | Admit: 2021-10-30 | Discharge: 2021-10-30 | Disposition: A | Discharge status: Home / Self Care | Payer: 59 | Attending: Family Medicine | Admitting: Family Medicine

## 2021-10-30 ENCOUNTER — Ambulatory Visit
Admission: RE | Admit: 2021-10-30 | Discharge: 2021-10-30 | Disposition: A | Discharge status: Home / Self Care | Payer: 59 | Source: Ambulatory Visit

## 2021-10-30 ENCOUNTER — Encounter: Payer: Self-pay | Admitting: Family Medicine

## 2021-10-30 VITALS — BP 138/72 | HR 84 | Temp 98.4°F | Ht 59.49 in | Wt 242.2 lb

## 2021-10-30 DIAGNOSIS — M79602 Pain in left arm: Secondary | ICD-10-CM

## 2021-10-30 DIAGNOSIS — M19011 Primary osteoarthritis, right shoulder: Secondary | ICD-10-CM | POA: Diagnosis not present

## 2021-10-30 DIAGNOSIS — M19012 Primary osteoarthritis, left shoulder: Secondary | ICD-10-CM | POA: Diagnosis not present

## 2021-10-30 DIAGNOSIS — W57XXXA Bitten or stung by nonvenomous insect and other nonvenomous arthropods, initial encounter: Secondary | ICD-10-CM | POA: Diagnosis not present

## 2021-10-30 DIAGNOSIS — M4802 Spinal stenosis, cervical region: Secondary | ICD-10-CM | POA: Diagnosis not present

## 2021-10-30 DIAGNOSIS — M79601 Pain in right arm: Secondary | ICD-10-CM | POA: Diagnosis not present

## 2021-10-30 DIAGNOSIS — M47812 Spondylosis without myelopathy or radiculopathy, cervical region: Secondary | ICD-10-CM | POA: Diagnosis not present

## 2021-10-30 NOTE — Progress Notes (Signed)
BP 138/72   Pulse 84   Temp 98.4 F (36.9 C) (Oral)   Ht 4' 11.49" (1.511 m)   Wt 242 lb 3.2 oz (109.9 kg)   LMP  (LMP Unknown)   SpO2 99%   BMI 48.12 kg/m    Subjective:    Patient ID: Tricia Hill, female    DOB: Aug 21, 1970, 51 y.o.   MRN: [id number]  HPI: Tricia Hill is a 51 y.o. female  Chief Complaint  Patient presents with   Arm Pain    B/L arm pain. Right arm started a few months ago Left are started hurting about 3 weeks ago.Patient also states that it has been hurting to grip things.   ARM PAIN Duration: R arm couple of months, L arm 3 weeks ago Location: middle upper arm down Mechanism of injury: unknown Onset: gradual Severity: moderate to severe  Quality:  aching pain Frequency: intermittent Radiation: into her wrist Aggravating factors: holding and grasping  Alleviating factors: nothing  Status: worse Treatments attempted: rest, ice, heat, APAP, ibuprofen, and aleve  Relief with NSAIDs?:  no Swelling: no Redness: no  Warmth: no Trauma: no Chest pain: no  Shortness of breath: no  Fever: no Decreased sensation: no Paresthesias: no Weakness: yes  Relevant past medical, surgical, family and social history reviewed and updated as indicated. Interim medical history since our last visit reviewed. Allergies and medications reviewed and updated.  Review of Systems  Constitutional: Negative.   Respiratory: Negative.    Cardiovascular: Negative.   Musculoskeletal:  Positive for arthralgias and myalgias. Negative for back pain, gait problem, joint swelling, neck pain and neck stiffness.  Skin: Negative.   Neurological:  Positive for weakness. Negative for dizziness, tremors, seizures, syncope, facial asymmetry, speech difficulty, light-headedness, numbness and headaches.  Psychiatric/Behavioral: Negative.     Per HPI unless specifically indicated above     Objective:    BP 138/72   Pulse 84   Temp 98.4 F (36.9 C) (Oral)   Ht 4' 11.49"  (1.511 m)   Wt 242 lb 3.2 oz (109.9 kg)   LMP  (LMP Unknown)   SpO2 99%   BMI 48.12 kg/m   Wt Readings from Last 3 Encounters:  10/30/21 242 lb 3.2 oz (109.9 kg)  09/05/21 236 lb 6.4 oz (107.2 kg)  06/07/21 234 lb 3.2 oz (106.2 kg)    Physical Exam Vitals and nursing note reviewed.  Constitutional:      General: She is not in acute distress.    Appearance: Normal appearance. She is obese. She is not ill-appearing, toxic-appearing or diaphoretic.  HENT:     Head: Normocephalic and atraumatic.     Right Ear: External ear normal.     Left Ear: External ear normal.     Nose: Nose normal.     Mouth/Throat:     Mouth: Mucous membranes are moist.     Pharynx: Oropharynx is clear.  Eyes:     General: No scleral icterus.       Right eye: No discharge.        Left eye: No discharge.     Extraocular Movements: Extraocular movements intact.     Conjunctiva/sclera: Conjunctivae normal.     Pupils: Pupils are equal, round, and reactive to light.  Cardiovascular:     Rate and Rhythm: Normal rate and regular rhythm.     Pulses: Normal pulses.     Heart sounds: Normal heart sounds. No murmur heard.   No friction  rub. No gallop.  Pulmonary:     Effort: Pulmonary effort is normal. No respiratory distress.     Breath sounds: Normal breath sounds. No stridor. No wheezing, rhonchi or rales.  Chest:     Chest wall: No tenderness.  Musculoskeletal:        General: Normal range of motion.     Cervical back: Normal range of motion and neck supple.  Skin:    General: Skin is warm and dry.     Capillary Refill: Capillary refill takes less than 2 seconds.     Coloration: Skin is not jaundiced or pale.     Findings: No bruising, erythema, lesion or rash.  Neurological:     General: No focal deficit present.     Mental Status: She is alert and oriented to person, place, and time. Mental status is at baseline.  Psychiatric:        Mood and Affect: Mood normal.        Behavior: Behavior normal.         Thought Content: Thought content normal.        Judgment: Judgment normal.    Results for orders placed or performed in visit on 09/05/21  Bayer DCA Hb A1c Waived  Result Value Ref Range   HB A1C (BAYER DCA - WAIVED) 9.5 (H) 4.8 - 5.6 %      Assessment & Plan:   Problem List Items Addressed This Visit   None Visit Diagnoses     Bilateral arm pain    -  Primary   Will check labs and x-rays. Await results. Treat as needed.    Relevant Orders   Lyme Disease Serology w/Reflex   Babesia microti Antibody Panel   Rocky mtn spotted fvr abs pnl(IgG+IgM)   Sed Rate (ESR)   Ehrlichia Antibody Panel   Comprehensive metabolic panel   CBC with Differential/Platelet   VITAMIN D 25 Hydroxy (Vit-D Deficiency, Fractures)   DG Cervical Spine Complete   DG Shoulder Left   DG Shoulder Right   Tick bite, unspecified site, initial encounter       Will check labs. Await results. Treat as needed.    Relevant Orders   Lyme Disease Serology w/Reflex   Babesia microti Antibody Panel   Rocky mtn spotted fvr abs pnl(IgG+IgM)   Ehrlichia Antibody Panel        Follow up plan: Return As scheduled.

## 2021-10-31 ENCOUNTER — Encounter: Payer: Self-pay | Admitting: Family Medicine

## 2021-10-31 ENCOUNTER — Other Ambulatory Visit: Payer: Self-pay | Admitting: Family Medicine

## 2021-10-31 DIAGNOSIS — M5412 Radiculopathy, cervical region: Secondary | ICD-10-CM

## 2021-10-31 LAB — CBC WITH DIFFERENTIAL/PLATELET
Basophils Absolute: 0 10*3/uL (ref 0.0–0.2)
Basos: 0 %
EOS (ABSOLUTE): 0.1 10*3/uL (ref 0.0–0.4)
Eos: 1 %
Hematocrit: 38.6 % (ref 34.0–46.6)
Hemoglobin: 12.6 g/dL (ref 11.1–15.9)
Immature Grans (Abs): 0 10*3/uL (ref 0.0–0.1)
Immature Granulocytes: 0 %
Lymphocytes Absolute: 2.4 10*3/uL (ref 0.7–3.1)
Lymphs: 31 %
MCH: 29 pg (ref 26.6–33.0)
MCHC: 32.6 g/dL (ref 31.5–35.7)
MCV: 89 fL (ref 79–97)
Monocytes Absolute: 0.4 10*3/uL (ref 0.1–0.9)
Monocytes: 5 %
Neutrophils Absolute: 4.7 10*3/uL (ref 1.4–7.0)
Neutrophils: 63 %
Platelets: 261 10*3/uL (ref 150–450)
RBC: 4.35 x10E6/uL (ref 3.77–5.28)
RDW: 13.1 % (ref 11.7–15.4)
WBC: 7.6 10*3/uL (ref 3.4–10.8)

## 2021-10-31 LAB — SEDIMENTATION RATE: Sed Rate: 21 mm/hr (ref 0–32)

## 2021-11-02 LAB — COMPREHENSIVE METABOLIC PANEL
ALT: 25 IU/L (ref 0–32)
AST: 16 IU/L (ref 0–40)
Albumin/Globulin Ratio: 1.6 (ref 1.2–2.2)
Albumin: 4.1 g/dL (ref 3.8–4.8)
Alkaline Phosphatase: 129 IU/L — ABNORMAL HIGH (ref 44–121)
BUN/Creatinine Ratio: 17 (ref 9–23)
BUN: 10 mg/dL (ref 6–24)
Bilirubin Total: <0.2 mg/dL (ref 0.0–1.2)
CO2: 25 mmol/L (ref 20–29)
Calcium: 9.2 mg/dL (ref 8.7–10.2)
Chloride: 99 mmol/L (ref 96–106)
Creatinine, Ser: 0.6 mg/dL (ref 0.57–1.00)
Globulin, Total: 2.5 g/dL (ref 1.5–4.5)
Glucose: 252 mg/dL — ABNORMAL HIGH (ref 70–99)
Potassium: 3.7 mmol/L (ref 3.5–5.2)
Sodium: 141 mmol/L (ref 134–144)
Total Protein: 6.6 g/dL (ref 6.0–8.5)
eGFR: 110 mL/min/{1.73_m2} (ref >59)

## 2021-11-02 LAB — BABESIA MICROTI ANTIBODY PANEL
Babesia microti IgG: <1:10 {titer}
Babesia microti IgM: <1:10 {titer}

## 2021-11-02 LAB — LYME DISEASE SEROLOGY W/REFLEX: Lyme Total Antibody EIA: NEGATIVE

## 2021-11-02 LAB — EHRLICHIA ANTIBODY PANEL
E. Chaffeensis (HME) IgM Titer: NEGATIVE
E.Chaffeensis (HME) IgG: NEGATIVE
HGE IgG Titer: NEGATIVE
HGE IgM Titer: NEGATIVE

## 2021-11-02 LAB — ROCKY MTN SPOTTED FVR ABS PNL(IGG+IGM)
RMSF IgG: NEGATIVE
RMSF IgM: 0.28 index (ref 0.00–0.89)

## 2021-11-02 LAB — VITAMIN D 25 HYDROXY (VIT D DEFICIENCY, FRACTURES): Vit D, 25-Hydroxy: 22.3 ng/mL — ABNORMAL LOW (ref 30.0–100.0)

## 2021-11-22 ENCOUNTER — Encounter: Payer: Self-pay | Admitting: Family Medicine

## 2021-11-27 NOTE — Telephone Encounter (Signed)
Can we please change PT referral to [facility]? Thanks!

## 2021-12-05 ENCOUNTER — Ambulatory Visit: Payer: 59 | Attending: Family Medicine | Admitting: Physical Therapy

## 2021-12-05 DIAGNOSIS — M5412 Radiculopathy, cervical region: Secondary | ICD-10-CM | POA: Insufficient documentation

## 2021-12-05 DIAGNOSIS — M79632 Pain in left forearm: Secondary | ICD-10-CM | POA: Diagnosis not present

## 2021-12-05 DIAGNOSIS — M79631 Pain in right forearm: Secondary | ICD-10-CM | POA: Insufficient documentation

## 2021-12-07 ENCOUNTER — Ambulatory Visit: Payer: 59 | Admitting: Family Medicine

## 2021-12-07 ENCOUNTER — Encounter: Payer: Self-pay | Admitting: Family Medicine

## 2021-12-07 ENCOUNTER — Other Ambulatory Visit: Payer: Self-pay

## 2021-12-07 VITALS — BP 137/80 | HR 69 | Temp 98.3°F | Wt 238.4 lb

## 2021-12-07 DIAGNOSIS — E1169 Type 2 diabetes mellitus with other specified complication: Secondary | ICD-10-CM | POA: Diagnosis not present

## 2021-12-07 DIAGNOSIS — Z0000 Encounter for general adult medical examination without abnormal findings: Secondary | ICD-10-CM

## 2021-12-07 DIAGNOSIS — Z1231 Encounter for screening mammogram for malignant neoplasm of breast: Secondary | ICD-10-CM | POA: Diagnosis not present

## 2021-12-07 DIAGNOSIS — E785 Hyperlipidemia, unspecified: Secondary | ICD-10-CM

## 2021-12-07 DIAGNOSIS — I10 Essential (primary) hypertension: Secondary | ICD-10-CM | POA: Diagnosis not present

## 2021-12-07 DIAGNOSIS — E1165 Type 2 diabetes mellitus with hyperglycemia: Secondary | ICD-10-CM

## 2021-12-07 LAB — URINALYSIS, ROUTINE W REFLEX MICROSCOPIC
Bilirubin, UA: NEGATIVE
Glucose, UA: NEGATIVE
Ketones, UA: NEGATIVE
Leukocytes,UA: NEGATIVE
Nitrite, UA: NEGATIVE
Protein,UA: NEGATIVE
RBC, UA: NEGATIVE
Specific Gravity, UA: 1.02 (ref 1.005–1.030)
Urobilinogen, Ur: 0.2 mg/dL (ref 0.2–1.0)
pH, UA: 7 (ref 5.0–7.5)

## 2021-12-07 LAB — MICROALBUMIN, URINE WAIVED
Creatinine, Urine Waived: 100 mg/dL (ref 10–300)
Microalb, Ur Waived: 30 mg/L — ABNORMAL HIGH (ref 0–19)
Microalb/Creat Ratio: <30 mg/g (ref <30)

## 2021-12-07 LAB — BAYER DCA HB A1C WAIVED: HB A1C (BAYER DCA - WAIVED): 8.3 % — ABNORMAL HIGH (ref 4.8–5.6)

## 2021-12-07 MED ORDER — LOSARTAN POTASSIUM 25 MG PO TABS
12.5000 mg | ORAL_TABLET | Freq: Every day | ORAL | 1 refills | Status: DC
  Filled 2021-12-07: qty 45, 90d supply, fill #0
  Filled 2022-04-16: qty 45, 90d supply, fill #1

## 2021-12-07 MED ORDER — METFORMIN HCL 1000 MG PO TABS
1000.0000 mg | ORAL_TABLET | Freq: Two times a day (BID) | ORAL | 1 refills | Status: DC
  Filled 2021-12-07: qty 180, 90d supply, fill #0
  Filled 2022-04-16: qty 180, 90d supply, fill #1

## 2021-12-07 MED ORDER — RYBELSUS 14 MG PO TABS
1.0000 | ORAL_TABLET | Freq: Every day | ORAL | 1 refills | Status: DC
  Filled 2021-12-07: qty 90, 90d supply, fill #0
  Filled 2022-04-16 (×2): qty 30, 30d supply, fill #1

## 2021-12-07 MED ORDER — ESCITALOPRAM OXALATE 5 MG PO TABS
5.0000 mg | ORAL_TABLET | Freq: Every day | ORAL | 1 refills | Status: DC
  Filled 2021-12-07: qty 90, 90d supply, fill #0
  Filled 2022-04-16: qty 90, 90d supply, fill #1

## 2021-12-07 MED ORDER — CYCLOBENZAPRINE HCL 10 MG PO TABS
10.0000 mg | ORAL_TABLET | Freq: Every day | ORAL | 0 refills | Status: DC
  Filled 2021-12-07: qty 30, 30d supply, fill #0

## 2021-12-07 MED ORDER — ATORVASTATIN CALCIUM 40 MG PO TABS
40.0000 mg | ORAL_TABLET | ORAL | 0 refills | Status: DC
  Filled 2021-12-07: qty 12, 84d supply, fill #0
  Filled 2022-04-16: qty 12, 84d supply, fill #1

## 2021-12-07 NOTE — Assessment & Plan Note (Signed)
Doing well with A1c down to 8.3 from 9.5. Continue current regimen. Recheck 3 months. If not coming further down consider adding glipizide.

## 2021-12-07 NOTE — Progress Notes (Signed)
BP 137/80   Pulse 69   Temp 98.3 F (36.8 C) (Oral)   Wt 238 lb 6.4 oz (108.1 kg)   LMP  (LMP Unknown)   SpO2 98%   BMI 47.36 kg/m    Subjective:    Patient ID: Tricia Hill, female    DOB: 23-Jul-1970, 51 y.o.   MRN: [id number]  HPI: Tricia Hill is a 51 y.o. female presenting on 12/07/2021 for comprehensive medical examination. Current medical complaints include:  DIABETES Hypoglycemic episodes:no Polydipsia/polyuria: no Visual disturbance: no Chest pain: no Paresthesias: no Glucose Monitoring: yes  Accucheck frequency: Daily Taking Insulin?: no Blood Pressure Monitoring: not checking Retinal Examination: Up to Date Foot Exam: Up to Date Diabetic Education: Completed Pneumovax: Up to Date Influenza: Up to Date Aspirin: no  HYPERTENSION / [place] Satisfied with current treatment? yes Duration of hypertension: chronic BP monitoring frequency: not checking BP medication side effects: no Past BP meds: losartan Duration of hyperlipidemia: chronic Cholesterol medication side effects: no Cholesterol supplements: none Past cholesterol medications: atorvastatin Medication compliance: excellent compliance Aspirin: no Recent stressors: no Recurrent headaches: no Visual changes: no Palpitations: no Dyspnea: no Chest pain: no Lower extremity edema: no Dizzy/lightheaded: no  ANXIETY/DEPRESSION Duration: chronic Status:controlled Anxious mood: no  Excessive worrying: no Irritability: no  Sweating: no Nausea: no Palpitations:no Hyperventilation: no Panic attacks: no Agoraphobia: no  Obscessions/compulsions: no Depressed mood: no    12/07/2021    9:26 AM 10/30/2021    3:03 PM 09/05/2021    9:27 AM 06/07/2021    9:44 AM 12/02/2020    8:11 AM  Depression screen PHQ 2/9  Decreased Interest 0 0 0 0 0  Down, Depressed, Hopeless 0 0 0 0 0  PHQ - 2 Score 0 0 0 0 0  Altered sleeping 1 0 1 1   Tired, decreased energy 1 0 0 0   Change in appetite 0  0 0 0   Feeling bad or failure about yourself  0 0 1 0   Trouble concentrating 0 0 0 0   Moving slowly or fidgety/restless 0 0 0 0   Suicidal thoughts 0 0 0 0   PHQ-9 Score 2 0 2 1   Difficult doing work/chores Not difficult at all Not difficult at all      Anhedonia: no Weight changes: no Insomnia: no   Hypersomnia: no Fatigue/loss of energy: no Feelings of worthlessness: no Feelings of guilt: no Impaired concentration/indecisiveness: no Suicidal ideations: no  Crying spells: no Recent Stressors/Life Changes: no   Relationship problems: no   Family stress: no     Financial stress: no    Job stress: no    Recent death/loss: no  Menopausal Symptoms: no  Depression Screen done today and results listed below:     12/07/2021    9:26 AM 10/30/2021    3:03 PM 09/05/2021    9:27 AM 06/07/2021    9:44 AM 12/02/2020    8:11 AM  Depression screen PHQ 2/9  Decreased Interest 0 0 0 0 0  Down, Depressed, Hopeless 0 0 0 0 0  PHQ - 2 Score 0 0 0 0 0  Altered sleeping 1 0 1 1   Tired, decreased energy 1 0 0 0   Change in appetite 0 0 0 0   Feeling bad or failure about yourself  0 0 1 0   Trouble concentrating 0 0 0 0   Moving slowly or fidgety/restless 0 0 0 0   Suicidal  thoughts 0 0 0 0   PHQ-9 Score 2 0 2 1   Difficult doing work/chores Not difficult at all Not difficult at all       Past Medical History:  Past Medical History:  Diagnosis Date   Asthma    Diabetes mellitus without complication ([place]) 2924   GERD (gastroesophageal reflux disease)    Heart murmur    child   Hypertension    Shingles    Sleep apnea    USE CPAP    Surgical History:  Past Surgical History:  Procedure Laterality Date   BACK SURGERY     CESAREAN SECTION     X 2   CHOLECYSTECTOMY N/A 01/18/2017   Procedure: LAPAROSCOPIC CHOLECYSTECTOMY WITH INTRAOPERATIVE CHOLANGIOGRAM;  Surgeon: Robert Bellow, MD;  Location: [facility] ORS;  Service: General;  Laterality: N/A;   COLONOSCOPY WITH PROPOFOL  N/A 10/13/2020   Procedure: COLONOSCOPY WITH PROPOFOL;  Surgeon: Lin Landsman, MD;  Location: [facility] END[facility];  Service: Gastroenterology;  Laterality: N/A;  COVID POSITIVE 09/30/2020   DIAGNOSTIC LAPAROSCOPY  2006   EXCISION OF ABDOMINAL WALL ENDOMETRIOMA   SPINAL FUSION  2012   C3 and C4   TOTAL ABDOMINAL HYSTERECTOMY     Partial    Medications:  Current Outpatient Medications on File Prior to Visit  Medication Sig   glucose monitoring kit (FREESTYLE) monitoring kit 1 each by Does not apply route as needed for other.   Lancets (FREESTYLE) lancets 1 each by Other route See admin instructions. Use as instructed   Semaglutide (RYBELSUS) 14 MG TABS Take 1 tablet by mouth daily.   No current facility-administered medications on file prior to visit.    Allergies:  Allergies  Allergen Reactions   Lisinopril Cough   Ultram [Tramadol Hcl] Nausea And Vomiting    Severe vomiting    Victoza [Liraglutide] Nausea And Vomiting    Social History:  Social History   Socioeconomic History   Marital status: Married    Spouse name: Not on file   Number of children: Not on file   Years of education: Not on file   Highest education level: Not on file  Occupational History   Not on file  Tobacco Use   Smoking status: Never   Smokeless tobacco: Never  Vaping Use   Vaping Use: Never used  Substance and Sexual Activity   Alcohol use: Yes    Comment: on occasion   Drug use: No   Sexual activity: Never    Birth control/protection: None, Surgical  Other Topics Concern   Not on file  Social History Narrative   Not on file   Social Determinants of Health   Financial Resource Strain: Not on file  Food Insecurity: Not on file  Transportation Needs: Not on file  Physical Activity: Not on file  Stress: Not on file  Social Connections: Not on file  Intimate Partner Violence: Not on file   Social History   Tobacco Use  Smoking Status Never  Smokeless Tobacco Never   Social  History   Substance and Sexual Activity  Alcohol Use Yes   Comment: on occasion    Family History:  Family History  Problem Relation Age of Onset   Hypertension Mother    Diabetes Mother    Hypertension Father    Berenice Primas' disease Sister    Clotting disorder Brother    Heart disease Maternal Grandmother    Heart disease Maternal Grandfather    Kidney disease Neg Hx  Breast cancer Neg Hx     Past medical history, surgical history, medications, allergies, family history and social history reviewed with patient today and changes made to appropriate areas of the chart.   Review of Systems  Constitutional: Negative.   HENT: Negative.    Eyes: Negative.   Respiratory: Negative.    Cardiovascular:  Positive for leg swelling. Negative for chest pain, palpitations, orthopnea, claudication and PND.  Gastrointestinal: Negative.   Genitourinary: Negative.   Musculoskeletal:  Positive for myalgias. Negative for back pain, falls, joint pain and neck pain.  Skin: Negative.   Neurological: Negative.   Endo/Heme/Allergies: Negative.   Psychiatric/Behavioral: Negative.     All other ROS negative except what is listed above and in the HPI.      Objective:    BP 137/80   Pulse 69   Temp 98.3 F (36.8 C) (Oral)   Wt 238 lb 6.4 oz (108.1 kg)   LMP  (LMP Unknown)   SpO2 98%   BMI 47.36 kg/m   Wt Readings from Last 3 Encounters:  12/07/21 238 lb 6.4 oz (108.1 kg)  10/30/21 242 lb 3.2 oz (109.9 kg)  09/05/21 236 lb 6.4 oz (107.2 kg)    Physical Exam Vitals and nursing note reviewed.  Constitutional:      General: She is not in acute distress.    Appearance: Normal appearance. She is obese. She is not ill-appearing, toxic-appearing or diaphoretic.  HENT:     Head: Normocephalic and atraumatic.     Right Ear: Tympanic membrane, ear canal and external ear normal. There is no impacted cerumen.     Left Ear: Tympanic membrane, ear canal and external ear normal. There is no impacted  cerumen.     Nose: Nose normal. No congestion or rhinorrhea.     Mouth/Throat:     Mouth: Mucous membranes are moist.     Pharynx: Oropharynx is clear. No oropharyngeal exudate or posterior oropharyngeal erythema.  Eyes:     General: No scleral icterus.       Right eye: No discharge.        Left eye: No discharge.     Extraocular Movements: Extraocular movements intact.     Conjunctiva/sclera: Conjunctivae normal.     Pupils: Pupils are equal, round, and reactive to light.  Neck:     Vascular: No carotid bruit.  Cardiovascular:     Rate and Rhythm: Normal rate and regular rhythm.     Pulses: Normal pulses.     Heart sounds: No murmur heard.    No friction rub. No gallop.  Pulmonary:     Effort: Pulmonary effort is normal. No respiratory distress.     Breath sounds: Normal breath sounds. No stridor. No wheezing, rhonchi or rales.  Chest:     Chest wall: No tenderness.  Abdominal:     General: Abdomen is flat. Bowel sounds are normal. There is no distension.     Palpations: Abdomen is soft. There is no mass.     Tenderness: There is no abdominal tenderness. There is no right CVA tenderness, left CVA tenderness, guarding or rebound.     Hernia: No hernia is present.  Genitourinary:    Comments: Breast and pelvic exams deferred with shared decision making Musculoskeletal:        General: No swelling, tenderness, deformity or signs of injury.     Cervical back: Normal range of motion and neck supple. No rigidity. No muscular tenderness.     Right  lower leg: No edema.     Left lower leg: No edema.  Lymphadenopathy:     Cervical: No cervical adenopathy.  Skin:    General: Skin is warm and dry.     Capillary Refill: Capillary refill takes less than 2 seconds.     Coloration: Skin is not jaundiced or pale.     Findings: No bruising, erythema, lesion or rash.  Neurological:     General: No focal deficit present.     Mental Status: She is alert and oriented to person, place, and  time. Mental status is at baseline.     Cranial Nerves: No cranial nerve deficit.     Sensory: No sensory deficit.     Motor: No weakness.     Coordination: Coordination normal.     Gait: Gait normal.     Deep Tendon Reflexes: Reflexes normal.  Psychiatric:        Mood and Affect: Mood normal.        Behavior: Behavior normal.        Thought Content: Thought content normal.        Judgment: Judgment normal.     Results for orders placed or performed in visit on 12/07/21  Urinalysis, Routine w reflex microscopic  Result Value Ref Range   Specific Gravity, UA 1.020 1.005 - 1.030   pH, UA 7.0 5.0 - 7.5   Color, UA Yellow Yellow   Appearance Ur Cloudy (A) Clear   Leukocytes,UA Negative Negative   Protein,UA Negative Negative/Trace   Glucose, UA Negative Negative   Ketones, UA Negative Negative   RBC, UA Negative Negative   Bilirubin, UA Negative Negative   Urobilinogen, Ur 0.2 0.2 - 1.0 mg/dL   Nitrite, UA Negative Negative  Microalbumin, Urine Waived  Result Value Ref Range   Microalb, Ur Waived 30 (H) 0 - 19 mg/L   Creatinine, Urine Waived 100 10 - 300 mg/dL   Microalb/Creat Ratio <30 <30 mg/g  Bayer DCA Hb A1c Waived  Result Value Ref Range   HB A1C (BAYER DCA - WAIVED) 8.3 (H) 4.8 - 5.6 %      Assessment & Plan:   Problem List Items Addressed This Visit       Cardiovascular and Mediastinum   HTN (hypertension)    Under good control on current regimen. Continue current regimen. Continue to monitor. Call with any concerns. Refills given. Labs drawn today.        Relevant Medications   losartan (COZAAR) 25 MG tablet   atorvastatin (LIPITOR) 40 MG tablet   Other Relevant Orders   CBC with Differential/Platelet   Comprehensive metabolic panel   Microalbumin, Urine Waived (Completed)     Endocrine   Type 2 diabetes mellitus with hyperglycemia (HCC)    Doing well with A1c down to 8.3 from 9.5. Continue current regimen. Recheck 3 months. If not coming further down  consider adding glipizide.      Relevant Medications   Semaglutide (RYBELSUS) 14 MG TABS   metFORMIN (GLUCOPHAGE) 1000 MG tablet   losartan (COZAAR) 25 MG tablet   atorvastatin (LIPITOR) 40 MG tablet   Other Relevant Orders   CBC with Differential/Platelet   Comprehensive metabolic panel   Urinalysis, Routine w reflex microscopic (Completed)   Microalbumin, Urine Waived (Completed)   Bayer DCA Hb A1c Waived (Completed)   Hyperlipidemia associated with type 2 diabetes mellitus ([place])    Under good control on current regimen. Continue current regimen. Continue to monitor. Call with any  concerns. Refills given. Labs drawn today.        Relevant Medications   Semaglutide (RYBELSUS) 14 MG TABS   metFORMIN (GLUCOPHAGE) 1000 MG tablet   losartan (COZAAR) 25 MG tablet   atorvastatin (LIPITOR) 40 MG tablet   Other Relevant Orders   CBC with Differential/Platelet   Comprehensive metabolic panel   Lipid Panel w/o Chol/HDL Ratio     Other   Morbid obesity ([place])    Encouraged diet and exercise with goal of losing 1-2lbs per week. Call with any concerns. Continue to monitor.       Relevant Medications   Semaglutide (RYBELSUS) 14 MG TABS   metFORMIN (GLUCOPHAGE) 1000 MG tablet   Other Visit Diagnoses     Routine general medical examination at a health care facility    -  Primary   Vaccines up to date. Screening labs checked today. Pap N/A. Mammo ordered. Colonoscopy up to date. Continue diet and exercise. Call with any concerns.    Relevant Orders   CBC with Differential/Platelet   Comprehensive metabolic panel   Lipid Panel w/o Chol/HDL Ratio   Urinalysis, Routine w reflex microscopic (Completed)   TSH   Microalbumin, Urine Waived (Completed)   Bayer DCA Hb A1c Waived (Completed)   Encounter for screening mammogram for malignant neoplasm of breast       Mammogram ordered today. Await results.    Relevant Orders   MM 3D SCREEN BREAST BILATERAL        Follow up plan: Return  in about 3 months (around 03/09/2022).   LABORATORY TESTING:  - Pap smear: not applicable  IMMUNIZATIONS:   - Tdap: Tetanus vaccination status reviewed: last tetanus booster within 10 years. - Influenza: Up to date - Pneumovax: Up to date - Prevnar: Not applicable - COVID: Up to date - HPV: Not applicable - Shingrix vaccine: Not applicable  SCREENING: -Mammogram: Ordered today  - Colonoscopy: Up to date   PATIENT COUNSELING:   Advised to take 1 mg of folate supplement per day if capable of pregnancy.   Sexuality: Discussed sexually transmitted diseases, partner selection, use of condoms, avoidance of unintended pregnancy  and contraceptive alternatives.   Advised to avoid cigarette smoking.  I discussed with the patient that most people either abstain from alcohol or drink within safe limits (<=14/week and <=4 drinks/occasion for males, <=7/weeks and <= 3 drinks/occasion for females) and that the risk for alcohol disorders and other health effects rises proportionally with the number of drinks per week and how often a drinker exceeds daily limits.  Discussed cessation/primary prevention of drug use and availability of treatment for abuse.   Diet: Encouraged to adjust caloric intake to maintain  or achieve ideal body weight, to reduce intake of dietary saturated fat and total fat, to limit sodium intake by avoiding high sodium foods and not adding table salt, and to maintain adequate dietary potassium and calcium preferably from fresh fruits, vegetables, and low-fat dairy products.    stressed the importance of regular exercise  Injury prevention: Discussed safety belts, safety helmets, smoke detector, smoking near bedding or upholstery.   Dental health: Discussed importance of regular tooth brushing, flossing, and dental visits.    NEXT PREVENTATIVE PHYSICAL DUE IN 1 YEAR. Return in about 3 months (around 03/09/2022).

## 2021-12-07 NOTE — Assessment & Plan Note (Signed)
Under good control on current regimen. Continue current regimen. Continue to monitor. Call with any concerns. Refills given. Labs drawn today.   

## 2021-12-07 NOTE — Assessment & Plan Note (Signed)
Encouraged diet and exercise with goal of losing 1-2lbs per week. Call with any concerns. Continue to monitor.

## 2021-12-07 NOTE — Patient Instructions (Signed)
Please call to schedule your mammogram and/or bone density: ?[facility]  ?Address: [address], [place], [place] [address] ?Phone: [phone]  ?

## 2021-12-08 LAB — LIPID PANEL W/O CHOL/HDL RATIO
Cholesterol, Total: 186 mg/dL (ref 100–199)
HDL: 42 mg/dL (ref >39)
LDL Chol Calc (NIH): 104 mg/dL — ABNORMAL HIGH (ref 0–99)
Triglycerides: 235 mg/dL — ABNORMAL HIGH (ref 0–149)
VLDL Cholesterol Cal: 40 mg/dL (ref 5–40)

## 2021-12-08 LAB — COMPREHENSIVE METABOLIC PANEL
ALT: 23 IU/L (ref 0–32)
AST: 13 IU/L (ref 0–40)
Albumin/Globulin Ratio: 1.6 (ref 1.2–2.2)
Albumin: 4.2 g/dL (ref 3.8–4.8)
Alkaline Phosphatase: 124 IU/L — ABNORMAL HIGH (ref 44–121)
BUN/Creatinine Ratio: 12 (ref 9–23)
BUN: 8 mg/dL (ref 6–24)
Bilirubin Total: 0.4 mg/dL (ref 0.0–1.2)
CO2: 24 mmol/L (ref 20–29)
Calcium: 9.3 mg/dL (ref 8.7–10.2)
Chloride: 95 mmol/L — ABNORMAL LOW (ref 96–106)
Creatinine, Ser: 0.65 mg/dL (ref 0.57–1.00)
Globulin, Total: 2.7 g/dL (ref 1.5–4.5)
Glucose: 203 mg/dL — ABNORMAL HIGH (ref 70–99)
Potassium: 3.9 mmol/L (ref 3.5–5.2)
Sodium: 135 mmol/L (ref 134–144)
Total Protein: 6.9 g/dL (ref 6.0–8.5)
eGFR: 108 mL/min/{1.73_m2} (ref >59)

## 2021-12-08 LAB — CBC WITH DIFFERENTIAL/PLATELET
Basophils Absolute: 0.1 10*3/uL (ref 0.0–0.2)
Basos: 1 %
EOS (ABSOLUTE): 0.2 10*3/uL (ref 0.0–0.4)
Eos: 2 %
Hematocrit: 40.4 % (ref 34.0–46.6)
Hemoglobin: 13.1 g/dL (ref 11.1–15.9)
Immature Grans (Abs): 0 10*3/uL (ref 0.0–0.1)
Immature Granulocytes: 0 %
Lymphocytes Absolute: 2.3 10*3/uL (ref 0.7–3.1)
Lymphs: 26 %
MCH: 28.3 pg (ref 26.6–33.0)
MCHC: 32.4 g/dL (ref 31.5–35.7)
MCV: 87 fL (ref 79–97)
Monocytes Absolute: 0.4 10*3/uL (ref 0.1–0.9)
Monocytes: 5 %
Neutrophils Absolute: 5.7 10*3/uL (ref 1.4–7.0)
Neutrophils: 66 %
Platelets: 278 10*3/uL (ref 150–450)
RBC: 4.63 x10E6/uL (ref 3.77–5.28)
RDW: 13.1 % (ref 11.7–15.4)
WBC: 8.6 10*3/uL (ref 3.4–10.8)

## 2021-12-08 LAB — TSH: TSH: 3.57 u[IU]/mL (ref 0.450–4.500)

## 2021-12-11 ENCOUNTER — Encounter: Payer: Self-pay | Admitting: Physical Therapy

## 2021-12-11 ENCOUNTER — Ambulatory Visit: Payer: 59 | Attending: Family Medicine | Admitting: Physical Therapy

## 2021-12-11 DIAGNOSIS — M79631 Pain in right forearm: Secondary | ICD-10-CM | POA: Diagnosis not present

## 2021-12-11 DIAGNOSIS — M79632 Pain in left forearm: Secondary | ICD-10-CM | POA: Insufficient documentation

## 2021-12-11 NOTE — Therapy (Signed)
OUTPATIENT PHYSICAL THERAPY TREATMENT NOTE   Patient Name: Tricia Hill MRN: [id number] DOB:December 14, 1970, 51 y.o., female Today's Date: 12/11/2021  PCP: Dr. Park Liter REFERRING PROVIDER: Dr. Park Liter   END OF SESSION:   PT End of Session - 12/11/21 0809     Visit Number 2    Number of Visits 16    Date for PT Re-Evaluation 01/30/22    Authorization Type [employer] [facility]    PT Start Time 0805    PT Stop Time 0845    PT Time Calculation (min) 40 min    Activity Tolerance Patient limited by pain    Behavior During Therapy [facility] for tasks assessed/performed             Past Medical History:  Diagnosis Date   Asthma    Diabetes mellitus without complication ([place]) 1102   GERD (gastroesophageal reflux disease)    Heart murmur    child   Hypertension    Shingles    Sleep apnea    USE CPAP   Past Surgical History:  Procedure Laterality Date   BACK SURGERY     CESAREAN SECTION     X 2   CHOLECYSTECTOMY N/A 01/18/2017   Procedure: LAPAROSCOPIC CHOLECYSTECTOMY WITH INTRAOPERATIVE CHOLANGIOGRAM;  Surgeon: Robert Bellow, MD;  Location: [facility] ORS;  Service: General;  Laterality: N/A;   COLONOSCOPY WITH PROPOFOL N/A 10/13/2020   Procedure: COLONOSCOPY WITH PROPOFOL;  Surgeon: Lin Landsman, MD;  Location: [facility] ENDOSCOPY;  Service: Gastroenterology;  Laterality: N/A;  COVID POSITIVE 09/30/2020   DIAGNOSTIC LAPAROSCOPY  2006   EXCISION OF ABDOMINAL WALL ENDOMETRIOMA   SPINAL FUSION  2012   C3 and C4   TOTAL ABDOMINAL HYSTERECTOMY     Partial   Patient Active Problem List   Diagnosis Date Noted   Anxiety 09/05/2021   HTN (hypertension) 04/16/2018   Fatty liver 12/07/2016   Microscopic hematuria 05/26/2015   OSA (obstructive sleep apnea) 01/31/2015   S/P hysterectomy 01/31/2015   Type 2 diabetes mellitus with hyperglycemia ([place]) 01/31/2015   Morbid obesity ([place]) 01/31/2015   Hyperlipidemia associated with type 2 diabetes mellitus ([place]) 01/31/2015     REFERRING DIAG: M54.12 (ICD-10-CM) - Cervical radiculopathy  THERAPY DIAG:  Pain in left forearm  Pain in right forearm  Rationale for Evaluation and Treatment Rehabilitation  PERTINENT HISTORY: Per Dr. Durenda Age note on 10/30/21   ARM PAIN Duration: R arm couple of months, L arm 3 weeks ago Location: middle upper arm down Mechanism of injury: unknown Onset: gradual Severity: moderate to severe  Quality:  aching pain Frequency: intermittent Radiation: into her wrist Aggravating factors: holding and grasping  Alleviating factors: nothing  Status: worse Treatments attempted: rest, ice, heat, APAP, ibuprofen, and aleve  Relief with NSAIDs?:  no Swelling: no Redness: no  Warmth: no Trauma: no Chest pain: no  Shortness of breath: no  Fever: no Decreased sensation: no Paresthesias: no Weakness: yes   Bilateral arm pain    -  Primary                    Will check labs and x-rays. Await results. Treat as needed.              Relevant Orders             Lyme Disease Serology w/Reflex             Babesia microti Antibody Panel  Rocky mtn spotted fvr abs pnl(IgG+IgM)             Sed Rate (ESR)             Ehrlichia Antibody Panel             Comprehensive metabolic panel             CBC with Differential/Platelet             VITAMIN D 25 Hydroxy (Vit-D Deficiency, Fractures)             DG Cervical Spine Complete             DG Shoulder Left             DG Shoulder Right              Tick bite, unspecified site, initial encounter                 Will check labs. Await results. Treat as needed.              Relevant Orders             Lyme Disease Serology w/Reflex             Babesia microti Antibody Panel             Rocky mtn spotted fvr abs pnl(IgG+IgM)             Ehrlichia Antibody Panel   Lyme rule out   PRECAUTIONS: None   SUBJECTIVE: Pt reports increased pain after working a shift last night. She does not note any increase in  activity.  PAIN:  Are you having pain? Yes: NPRS scale: 8/10 Pain location: Bilateral elbows  Pain description: achy and shooting  Aggravating factors: Lifting and pulling  Relieving factors: Nothing really helps   OBJECTIVE:    DIAGNOSTIC FINDINGS:                EXAM: CERVICAL SPINE - COMPLETE 4+ VIEW   COMPARISON:  Intraoperative cervical spine radiographs 09/08/2010   FINDINGS: Postsurgical changes are seen of C3 through C6 ACDF with associated intervertebral disc spacers. No periprosthetic lucency is seen to indicate hardware failure or loosening.   Moderate C2-3 and C6-7 disc space narrowing. Moderate anterior C2-3 endplate osteophytosis.   The atlantodens interval is intact.   On oblique views there appears to be bilateral C3-4 osseous neuroforaminal narrowing. 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   The lateral masses of C1 are symmetrically aligned with the dens on the open-mouth odontoid view.   No prevertebral soft tissue swelling.  The lung apices are clear.   IMPRESSION: 1. Status post C3 through C6 ACDF. 2. Moderate C2-3 and C6-7 degenerative disc and endplate changes. 3. 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               ////////////////////////////////////////////////////////////////////////////////   CLINICAL DATA:  Bilateral shoulder and arm pain.   EXAM: RIGHT SHOULDER - 2+ VIEW   COMPARISON:  None Available.   FINDINGS: Mild acromioclavicular joint space narrowing and peripheral osteophytosis. No significant osteoarthritis of the right glenohumeral joint. No acute fracture is seen. No dislocation. The visualized portion of the right lung is unremarkable. Partial visualization of ACDF hardware.   IMPRESSION: Mild right acromioclavicular osteoarthritis.     Electronically Signed   By: Yvonne Kendall M.D.   On: 10/31/2021 12:55    /////////////////////////////////////////////////////////////////////////   CLINICAL DATA:  Bilateral shoulder and  arm pain.   EXAM: LEFT SHOULDER - 2+ VIEW   COMPARISON:  None Available.   FINDINGS: Mild acromioclavicular joint space narrowing and peripheral osteophytosis. No acute fracture or dislocation. Partial visualization of ACDF hardware. The visualized portion of the left lung is unremarkable.   IMPRESSION: Mild left acromioclavicular osteoarthritis.            /////////////////////////////////////////////////////////////////////            Lyme rule out    Lyme Total Antibody EIA    Negative        Negative  Comment: Lyme antibodies not detected. Reflex testing is not indicated.  No laboratory evidence of infection with B. burgdorferi (Lyme disease).  Negative results may occur in patients recently infected (less than  or equal to 14 days) with B. burgdorferi.  If recent infection is  suspected, repeat testing on a new sample collected in 7 to 14 days is  recommended.    CLINICAL DATA:  History of ACDF. Right-greater-than-left arm pain and numbness.       PATIENT SURVEYS:  FOTO 39/60     COGNITION: Overall cognitive status: Within functional limits for tasks assessed     SENSATION: WFL   POSTURE: No Significant postural limitations   PALPATION: Forearm flexors on right forearm and lateral epicondylitis on left forearm            CERVICAL ROM:    Active ROM A/PROM (deg) eval  Flexion 45  Extension 45  Right lateral flexion 45  Left lateral flexion 45  Right rotation 60  Left rotation 60   (Blank rows = not tested)   UPPER EXTREMITY ROM:        Active ROM Right eval Left eval  Shoulder flexion 180 180  Shoulder extension 60 60  Shoulder abduction 180  180  Shoulder adduction      Shoulder internal rotation 70 70  Shoulder external rotation 90 90  Elbow flexion 150 150  Elbow extension 60* 60*  Wrist flexion 80 80  Wrist  extension 70 70  Wrist ulnar deviation 30 30  Wrist radial deviation 20 20  Wrist pronation      Wrist supination      (Blank rows = not tested)            UPPER EXTREMITY MMT:   MMT Right eval Left eval  Shoulder flexion 5 5  Shoulder extension      Shoulder abduction 5 5  Shoulder adduction      Shoulder extension      Shoulder internal rotation      Shoulder external rotation      Middle trapezius      Lower trapezius      Elbow flexion 5* 5*  Elbow extension 5* 5*  Wrist flexion 5 5  Wrist extension 5 5*  Wrist ulnar deviation 5 5  Wrist radial deviation 5 5  Wrist pronation 5* 5  Wrist supination 5 5  Grip strength       (Blank rows = not tested)   CERVICAL SPECIAL TESTS:    Cervical Radiculopathy Test cluster:   -rotation AROM <60 deg towards symptomatic side?: Negative bilateral    -Spurling's test: NT   -response to manual traction: NT   -upper limb tension test A: NT   FUNCTIONAL TESTS: None tested    Grip Strength (Mathiowetz 1984):   [email or web address]     TODAY'S TREATMENT:   12/11/21   THEREX    UBE  level 1 for 7 min (3.5 min forward and 3.5 min backward)  Biceps Stretch on wall with arm abducting   Wrist Extension Eccentric with #1 DB 3 x 10   -NPS 9/10    Wrist Extension Eccentric with weight of fist 3 x 10   Wrist Extension Concentric with weight of fist 1 x 10  MANUAL   Massage of forearm flexors  -Pain of lateral epicondyle  Massage of triceps  Massage of biceps    Initial  Wrist Flexion 4 x 30 sec      PATIENT EDUCATION:  Education details: form and technique for appropriate exercise. Explanation of differentials  Person educated: Patient Education method: Explanation, Demonstration, Verbal cues, and Handouts Education comprehension: verbalized understanding, returned demonstration, and verbal cues required     HOME  EXERCISE PROGRAM: Access Code: [email or web address] URL: [email or web address] Date: 12/05/2021 Prepared by: Bradly Chris   Exercises - Seated Eccentric Wrist Extension  - 1 x daily - 3 x weekly - 3 sets - 10 reps - Standing Wrist Flexion Stretch  - 1 x daily - 7 x weekly - 1 sets - 3 reps - 30 hold   Printed off following exercises from [email or web address]-[email or web address] because Med[employer] website down    ASSESSMENT:   CLINICAL IMPRESSION: Pt exhibits pain with wrist extension localized on lateral epicondyle indicative of lateral epicondylitis. Exercises modified to decrease weight during eccentric wrist extension to avoid exacerbation of pain and given regressed version of wrist extension concentric and isometric. Pt able to perform all exercises independently. Pt educated on typing posture to avoid excessive wrist extension. 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     OBJECTIVE IMPAIRMENTS decreased strength, impaired UE functional use, obesity, and pain.    ACTIVITY LIMITATIONS carrying, lifting, reach over head, and caring for others   PARTICIPATION LIMITATIONS: occupation and [place] Age, Fitness, Profession, and 1-2 comorbidities: Obesity and T2DM  are also affecting patient's functional outcome.    REHAB POTENTIAL: Good   CLINICAL DECISION MAKING: Stable/uncomplicated   EVALUATION COMPLEXITY: Low     GOALS: Goals reviewed with patient? No   SHORT TERM GOALS: Target date: 12/19/2021    Pt will be independent with HEP in order to improve strength and balance in order to decrease fall risk and improve function at home and work. Baseline: NT  Goal status: INITIAL     LONG TERM GOALS: Target date: 01/30/2022   Patient will have improved function and activity  level as evidenced by an increase in FOTO score by 10 points or more.  Baseline: 39 Goal status: INITIAL   2.  Patient will achieve a grip strength that is within standards for age and gender matched norms with 29 lbs for RUE and 28 lbs for LUE to carry out UE related tasks such as dressing and moving patients with less difficulty. Baseline: NT  Goal status: INITIAL       PLAN: PT FREQUENCY: 1-2x/week   PT DURATION: 8 weeks   PLANNED INTERVENTIONS: Therapeutic exercises, Therapeutic activity, Gait training, Patient/Family education, Joint manipulation, Joint mobilization, Dry Needling, Electrical stimulation, Spinal manipulation, Spinal mobilization, Cryotherapy, Moist heat, Manual therapy, and Re-evaluation   PLAN FOR NEXT SESSION: Test Grip Strength, delve further into RUE supinator, work on forearm strengtheners biceps and pronator and supinators.    Bradly Chris PT, DPT  12/11/2021, 9:32 AM

## 2021-12-13 ENCOUNTER — Other Ambulatory Visit: Payer: Self-pay

## 2021-12-13 ENCOUNTER — Ambulatory Visit: Payer: 59 | Admitting: Physical Therapy

## 2021-12-13 ENCOUNTER — Encounter: Payer: Self-pay | Admitting: Physical Therapy

## 2021-12-13 DIAGNOSIS — M79632 Pain in left forearm: Secondary | ICD-10-CM | POA: Diagnosis not present

## 2021-12-13 DIAGNOSIS — M79631 Pain in right forearm: Secondary | ICD-10-CM | POA: Diagnosis not present

## 2021-12-13 MED ORDER — DEXCOM G6 RECEIVER DEVI
1.0000 | 12 refills | Status: DC
  Filled 2021-12-13: qty 1, 90d supply, fill #0

## 2021-12-13 NOTE — Therapy (Signed)
OUTPATIENT PHYSICAL THERAPY TREATMENT NOTE   Patient Name: Tricia Hill MRN: [id number] DOB:1970-07-19, 51 y.o., female Today's Date: 12/13/2021  PCP: Dr. Park Liter REFERRING PROVIDER: Dr. Park Liter   END OF SESSION:   PT End of Session - 12/13/21 1151     Visit Number 3    Number of Visits 16    Date for PT Re-Evaluation 01/30/22    Authorization Type [employer] [facility]    PT Start Time 1655    PT Stop Time 1230    PT Time Calculation (min) 45 min    Activity Tolerance Patient limited by pain    Behavior During Therapy [facility] for tasks assessed/performed             Past Medical History:  Diagnosis Date   Asthma    Diabetes mellitus without complication ([place]) 3748   GERD (gastroesophageal reflux disease)    Heart murmur    child   Hypertension    Shingles    Sleep apnea    USE CPAP   Past Surgical History:  Procedure Laterality Date   BACK SURGERY     CESAREAN SECTION     X 2   CHOLECYSTECTOMY N/A 01/18/2017   Procedure: LAPAROSCOPIC CHOLECYSTECTOMY WITH INTRAOPERATIVE CHOLANGIOGRAM;  Surgeon: Robert Bellow, MD;  Location: [facility] ORS;  Service: General;  Laterality: N/A;   COLONOSCOPY WITH PROPOFOL N/A 10/13/2020   Procedure: COLONOSCOPY WITH PROPOFOL;  Surgeon: Lin Landsman, MD;  Location: [facility] ENDOSCOPY;  Service: Gastroenterology;  Laterality: N/A;  COVID POSITIVE 09/30/2020   DIAGNOSTIC LAPAROSCOPY  2006   EXCISION OF ABDOMINAL WALL ENDOMETRIOMA   SPINAL FUSION  2012   C3 and C4   TOTAL ABDOMINAL HYSTERECTOMY     Partial   Patient Active Problem List   Diagnosis Date Noted   Anxiety 09/05/2021   HTN (hypertension) 04/16/2018   Fatty liver 12/07/2016   Microscopic hematuria 05/26/2015   OSA (obstructive sleep apnea) 01/31/2015   S/P hysterectomy 01/31/2015   Type 2 diabetes mellitus with hyperglycemia ([place]) 01/31/2015   Morbid obesity ([place]) 01/31/2015   Hyperlipidemia associated with type 2 diabetes mellitus ([place]) 01/31/2015     REFERRING DIAG: M54.12 (ICD-10-CM) - Cervical radiculopathy  THERAPY DIAG:  Pain in left forearm  Pain in right forearm  Rationale for Evaluation and Treatment Rehabilitation  PERTINENT HISTORY: Per Dr. Durenda Age note on 10/30/21   ARM PAIN Duration: R arm couple of months, L arm 3 weeks ago Location: middle upper arm down Mechanism of injury: unknown Onset: gradual Severity: moderate to severe  Quality:  aching pain Frequency: intermittent Radiation: into her wrist Aggravating factors: holding and grasping  Alleviating factors: nothing  Status: worse Treatments attempted: rest, ice, heat, APAP, ibuprofen, and aleve  Relief with NSAIDs?:  no Swelling: no Redness: no  Warmth: no Trauma: no Chest pain: no  Shortness of breath: no  Fever: no Decreased sensation: no Paresthesias: no Weakness: yes   Bilateral arm pain    -  Primary                    Will check labs and x-rays. Await results. Treat as needed.              Relevant Orders             Lyme Disease Serology w/Reflex             Babesia microti Antibody Panel  Rocky mtn spotted fvr abs pnl(IgG+IgM)             Sed Rate (ESR)             Ehrlichia Antibody Panel             Comprehensive metabolic panel             CBC with Differential/Platelet             VITAMIN D 25 Hydroxy (Vit-D Deficiency, Fractures)             DG Cervical Spine Complete             DG Shoulder Left             DG Shoulder Right              Tick bite, unspecified site, initial encounter                 Will check labs. Await results. Treat as needed.              Relevant Orders             Lyme Disease Serology w/Reflex             Babesia microti Antibody Panel             Rocky mtn spotted fvr abs pnl(IgG+IgM)             Ehrlichia Antibody Panel   Lyme rule out   PRECAUTIONS: None   SUBJECTIVE: Pt continues to feel increased left>right lateral epicondyle pain even at rest and especially with exercises.       PAIN:  Are you having pain? Yes: NPRS scale: 5/10 Pain location: Left lateral epicondyle  Pain description: achy and shooting  Aggravating factors: Lifting and pulling  Relieving factors: Nothing really helps   OBJECTIVE:    DIAGNOSTIC FINDINGS:                EXAM: CERVICAL SPINE - COMPLETE 4+ VIEW   COMPARISON:  Intraoperative cervical spine radiographs 09/08/2010   FINDINGS: Postsurgical changes are seen of C3 through C6 ACDF with associated intervertebral disc spacers. No periprosthetic lucency is seen to indicate hardware failure or loosening.   Moderate C2-3 and C6-7 disc space narrowing. Moderate anterior C2-3 endplate osteophytosis.   The atlantodens interval is intact.   On oblique views there appears to be bilateral C3-4 osseous neuroforaminal narrowing. 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   The lateral masses of C1 are symmetrically aligned with the dens on the open-mouth odontoid view.   No prevertebral soft tissue swelling.  The lung apices are clear.   IMPRESSION: 1. Status post C3 through C6 ACDF. 2. Moderate C2-3 and C6-7 degenerative disc and endplate changes. 3. 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               ////////////////////////////////////////////////////////////////////////////////   CLINICAL DATA:  Bilateral shoulder and arm pain.   EXAM: RIGHT SHOULDER - 2+ VIEW   COMPARISON:  None Available.   FINDINGS: Mild acromioclavicular joint space narrowing and peripheral osteophytosis. No significant osteoarthritis of the right glenohumeral joint. No acute fracture is seen. No dislocation. The visualized portion of the right lung is unremarkable. Partial visualization of ACDF hardware.   IMPRESSION: Mild right acromioclavicular osteoarthritis.     Electronically Signed   By: Yvonne Kendall M.D.   On: 10/31/2021 12:55    /////////////////////////////////////////////////////////////////////////   CLINICAL DATA:  Bilateral shoulder and arm pain.   EXAM: LEFT SHOULDER - 2+ VIEW   COMPARISON:  None Available.   FINDINGS: Mild acromioclavicular joint space narrowing and peripheral osteophytosis. No acute fracture or dislocation. Partial visualization of ACDF hardware. The visualized portion of the left lung is unremarkable.   IMPRESSION: Mild left acromioclavicular osteoarthritis.            /////////////////////////////////////////////////////////////////////            Lyme rule out    Lyme Total Antibody EIA    Negative        Negative  Comment: Lyme antibodies not detected. Reflex testing is not indicated.  No laboratory evidence of infection with B. burgdorferi (Lyme disease).  Negative results may occur in patients recently infected (less than  or equal to 14 days) with B. burgdorferi.  If recent infection is  suspected, repeat testing on a new sample collected in 7 to 14 days is  recommended.    CLINICAL DATA:  History of ACDF. Right-greater-than-left arm pain and numbness.       PATIENT SURVEYS:  FOTO 39/60     COGNITION: Overall cognitive status: Within functional limits for tasks assessed     SENSATION: WFL   POSTURE: No Significant postural limitations   PALPATION: Forearm flexors on right forearm and lateral epicondylitis on left forearm            CERVICAL ROM:    Active ROM A/PROM (deg) eval  Flexion 45  Extension 45  Right lateral flexion 45  Left lateral flexion 45  Right rotation 60  Left rotation 60   (Blank rows = not tested)   UPPER EXTREMITY ROM:        Active ROM Right eval Left eval  Shoulder flexion 180 180  Shoulder extension 60 60  Shoulder abduction 180  180  Shoulder adduction      Shoulder internal rotation 70 70  Shoulder external rotation 90 90  Elbow flexion 150 150  Elbow extension 60* 60*  Wrist flexion 80 80  Wrist  extension 70 70  Wrist ulnar deviation 30 30  Wrist radial deviation 20 20  Wrist pronation      Wrist supination      (Blank rows = not tested)            UPPER EXTREMITY MMT:   MMT Right eval Left eval  Shoulder flexion 5 5  Shoulder extension      Shoulder abduction 5 5  Shoulder adduction      Shoulder extension      Shoulder internal rotation      Shoulder external rotation      Middle trapezius      Lower trapezius      Elbow flexion 5* 5*  Elbow extension 5* 5*  Wrist flexion 5 5  Wrist extension 5 5*  Wrist ulnar deviation 5 5  Wrist radial deviation 5 5  Wrist pronation 5* 5  Wrist supination 5 5  Grip strength       (Blank rows = not tested)   CERVICAL SPECIAL TESTS:    Cervical Radiculopathy Test cluster:   -rotation AROM <60 deg towards symptomatic side?: Negative bilateral    -Spurling's test: NT   -response to manual traction: NT   -upper limb tension test A: NT   FUNCTIONAL TESTS: None tested    Grip Strength ([place] 1984):   [email or web address]     TODAY'S TREATMENT:   12/13/21 UBE Level 1 Resistance  Seat Level 7 for 6 min   Left Wrist Ext Isometrics 3 sec holds x 5             -NPS increases from 5/10 to 8/10   Left Wrist Pronation/Supination #1 DB 1 x 10 -NPS from 5/10 to 6/10   Discussed use of tennis elbow brace as option given ongoing elbow pain localized to left lateral epicondylitis.   Use of BP cuff as makeshift tennis elbow brace for left elbow   Left Wrist Extension with #1 DB 1 x 10 with BP cuff  -NPS from 5/10 to 6/10   Left Wrist Pronation/Supination #1 DB 1 x 10 -NPS from 6/10 to 6/10              LUE Ball Squeeze 1 x 10      12/11/21   THEREX    UBE level 1 for 7 min (3.5 min forward and 3.5 min backward)  Biceps Stretch on wall with arm abducting   Wrist Extension Eccentric with #1 DB 3 x 10   -NPS 9/10     Wrist Extension Eccentric with weight of fist 3 x 10   Wrist Extension Concentric with weight of fist 1 x 10  MANUAL   Massage of forearm flexors  -Pain of lateral epicondyle  Massage of triceps  Massage of biceps    Initial  Wrist Flexion 4 x 30 sec      PATIENT EDUCATION:  Education details: form and technique for appropriate exercise. Explanation of differentials  Person educated: Patient Education method: Explanation, Demonstration, Verbal cues, and Handouts Education comprehension: verbalized understanding, returned demonstration, and verbal cues required     HOME EXERCISE PROGRAM: Access Code: [email or web address] URL: [email or web address] Date: 12/05/2021 Prepared by: Bradly Chris   Exercises - Seated Eccentric Wrist Extension  - 1 x daily - 3 x weekly - 3 sets - 10 reps - Standing Wrist Flexion Stretch  - 1 x daily - 7 x weekly - 1 sets - 3 reps - 30 hold   Printed off following exercises from [email or web address]-[email or web address] because Med[employer] website down    ASSESSMENT:   CLINICAL IMPRESSION:  Session terminated earlier due to increasing pain in left lateral epicondylitis and surrounding tendons. Pt's pain remained stable with use of tennis elbow brace with BP cuff mimicking tennis elbow brace during session. PT recommends that pt purchase tennis elbow brace and attempt HEP using brace given improved pain response. 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    OBJECTIVE IMPAIRMENTS decreased strength, impaired UE functional use, obesity, and pain.    ACTIVITY LIMITATIONS carrying, lifting, reach over head, and caring for others   PARTICIPATION LIMITATIONS: occupation and [place] Age, Fitness, Profession, and 1-2 comorbidities: Obesity and T2DM  are  also affecting patient's functional outcome.    REHAB POTENTIAL: Good   CLINICAL DECISION MAKING: Stable/uncomplicated   EVALUATION COMPLEXITY: Low     GOALS: Goals reviewed with patient? No   SHORT TERM GOALS: Target date: 12/19/2021    Pt will be independent with HEP in order to improve strength and balance in order to decrease fall risk and improve function at home and work. Baseline: Performing on her own  Goal status: ONGOING      LONG TERM GOALS: Target date: 01/30/2022   Patient will have improved function and activity level as evidenced by an  increase in FOTO score by 10 points or more.  Baseline: 39 Goal status: ONGOING    2.  Patient will achieve a grip strength that is within standards for age and gender matched norms with 29 lbs for RUE and 28 lbs for LUE to carry out UE related tasks such as dressing and moving patients with less difficulty. Baseline: NT  Goal status: ONGOING        PLAN: PT FREQUENCY: 1-2x/week   PT DURATION: 8 weeks   PLANNED INTERVENTIONS: Therapeutic exercises, Therapeutic activity, Gait training, Patient/Family education, Joint manipulation, Joint mobilization, Dry Needling, Electrical stimulation, Spinal manipulation, Spinal mobilization, Cryotherapy, Moist heat, Manual therapy, and Re-evaluation   PLAN FOR NEXT SESSION: Test Grip Strength, delve further into RUE supinator, work on forearm strengtheners biceps and pronator and supinators with tennis brace    Bradly Chris PT, DPT  12/13/2021, 11:51 AM   e

## 2021-12-18 ENCOUNTER — Ambulatory Visit: Payer: 59 | Admitting: Physical Therapy

## 2021-12-18 ENCOUNTER — Encounter: Payer: Self-pay | Admitting: Physical Therapy

## 2021-12-18 DIAGNOSIS — M79631 Pain in right forearm: Secondary | ICD-10-CM

## 2021-12-18 DIAGNOSIS — M79632 Pain in left forearm: Secondary | ICD-10-CM | POA: Diagnosis not present

## 2021-12-18 NOTE — Therapy (Signed)
OUTPATIENT PHYSICAL THERAPY TREATMENT NOTE   Patient Name: Tricia Hill MRN: [id number] DOB:[birth year], 51 y.o., female Today's Date: 12/18/2021  PCP: Dr. Park Liter REFERRING PROVIDER: Dr. Park Liter   END OF SESSION:   PT End of Session - 12/18/21 0902     Visit Number 4    Number of Visits 16    Date for PT Re-Evaluation 01/30/22    Authorization Type [employer] [facility]    PT Start Time 0845    PT Stop Time 0930    PT Time Calculation (min) 45 min    Activity Tolerance Patient limited by pain    Behavior During Therapy [facility] for tasks assessed/performed             Past Medical History:  Diagnosis Date   Asthma    Diabetes mellitus without complication ([place]) 3532   GERD (gastroesophageal reflux disease)    Heart murmur    child   Hypertension    Shingles    Sleep apnea    USE CPAP   Past Surgical History:  Procedure Laterality Date   BACK SURGERY     CESAREAN SECTION     X 2   CHOLECYSTECTOMY N/A 01/18/2017   Procedure: LAPAROSCOPIC CHOLECYSTECTOMY WITH INTRAOPERATIVE CHOLANGIOGRAM;  Surgeon: Robert Bellow, MD;  Location: [facility] ORS;  Service: General;  Laterality: N/A;   COLONOSCOPY WITH PROPOFOL N/A 10/13/2020   Procedure: COLONOSCOPY WITH PROPOFOL;  Surgeon: Lin Landsman, MD;  Location: [facility] ENDOSCOPY;  Service: Gastroenterology;  Laterality: N/A;  COVID POSITIVE 09/30/2020   DIAGNOSTIC LAPAROSCOPY  2006   EXCISION OF ABDOMINAL WALL ENDOMETRIOMA   SPINAL FUSION  2012   C3 and C4   TOTAL ABDOMINAL HYSTERECTOMY     Partial   Patient Active Problem List   Diagnosis Date Noted   Anxiety 09/05/2021   HTN (hypertension) 04/16/2018   Fatty liver 12/07/2016   Microscopic hematuria 05/26/2015   OSA (obstructive sleep apnea) 01/31/2015   S/P hysterectomy 01/31/2015   Type 2 diabetes mellitus with hyperglycemia ([place]) 01/31/2015   Morbid obesity ([place]) 01/31/2015   Hyperlipidemia associated with type 2 diabetes mellitus ([place]) 01/31/2015     REFERRING DIAG: M54.12 (ICD-10-CM) - Cervical radiculopathy  THERAPY DIAG:  Pain in left forearm  Pain in right forearm  Rationale for Evaluation and Treatment Rehabilitation  PERTINENT HISTORY: Per Dr. Durenda Age note on 10/30/21   ARM PAIN Duration: R arm couple of months, L arm 3 weeks ago Location: middle upper arm down Mechanism of injury: unknown Onset: gradual Severity: moderate to severe  Quality:  aching pain Frequency: intermittent Radiation: into her wrist Aggravating factors: holding and grasping  Alleviating factors: nothing  Status: worse Treatments attempted: rest, ice, heat, APAP, ibuprofen, and aleve  Relief with NSAIDs?:  no Swelling: no Redness: no  Warmth: no Trauma: no Chest pain: no  Shortness of breath: no  Fever: no Decreased sensation: no Paresthesias: no Weakness: yes   Bilateral arm pain    -  Primary                    Will check labs and x-rays. Await results. Treat as needed.              Relevant Orders             Lyme Disease Serology w/Reflex             Babesia microti Antibody Panel  Rocky mtn spotted fvr abs pnl(IgG+IgM)             Sed Rate (ESR)             Ehrlichia Antibody Panel             Comprehensive metabolic panel             CBC with Differential/Platelet             VITAMIN D 25 Hydroxy (Vit-D Deficiency, Fractures)             DG Cervical Spine Complete             DG Shoulder Left             DG Shoulder Right              Tick bite, unspecified site, initial encounter                 Will check labs. Await results. Treat as needed.              Relevant Orders             Lyme Disease Serology w/Reflex             Babesia microti Antibody Panel             Rocky mtn spotted fvr abs pnl(IgG+IgM)             Ehrlichia Antibody Panel   Lyme rule out   PRECAUTIONS: None   SUBJECTIVE:  Pt states she has been feeling less pain while using the tennis elbow brace on the left elbow but then     Pt continues to feel increased left>right lateral epicondyle pain even at rest and especially with exercises.      PAIN:  Are you having pain? Yes: NPRS scale: 5/10 Pain location: Left lateral epicondyle  Pain description: achy and shooting  Aggravating factors: Lifting and pulling  Relieving factors: Nothing really helps   OBJECTIVE:    DIAGNOSTIC FINDINGS:                EXAM: CERVICAL SPINE - COMPLETE 4+ VIEW   COMPARISON:  Intraoperative cervical spine radiographs 09/08/2010   FINDINGS: Postsurgical changes are seen of C3 through C6 ACDF with associated intervertebral disc spacers. No periprosthetic lucency is seen to indicate hardware failure or loosening.   Moderate C2-3 and C6-7 disc space narrowing. Moderate anterior C2-3 endplate osteophytosis.   The atlantodens interval is intact.   On oblique views there appears to be bilateral C3-4 osseous neuroforaminal narrowing. 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   The lateral masses of C1 are symmetrically aligned with the dens on the open-mouth odontoid view.   No prevertebral soft tissue swelling.  The lung apices are clear.   IMPRESSION: 1. Status post C3 through C6 ACDF. 2. Moderate C2-3 and C6-7 degenerative disc and endplate changes. 3. 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               ////////////////////////////////////////////////////////////////////////////////   CLINICAL DATA:  Bilateral shoulder and arm pain.   EXAM: RIGHT SHOULDER - 2+ VIEW   COMPARISON:  None Available.   FINDINGS: Mild acromioclavicular joint space narrowing and peripheral osteophytosis. No significant osteoarthritis of the right glenohumeral joint. No acute fracture is seen. No dislocation. The visualized portion of the right lung is unremarkable. Partial visualization of ACDF hardware.   IMPRESSION: Mild right acromioclavicular osteoarthritis.  Electronically Signed   By:  Yvonne Kendall M.D.   On: 10/31/2021 12:55   /////////////////////////////////////////////////////////////////////////   CLINICAL DATA:  Bilateral shoulder and arm pain.   EXAM: LEFT SHOULDER - 2+ VIEW   COMPARISON:  None Available.   FINDINGS: Mild acromioclavicular joint space narrowing and peripheral osteophytosis. No acute fracture or dislocation. Partial visualization of ACDF hardware. The visualized portion of the left lung is unremarkable.   IMPRESSION: Mild left acromioclavicular osteoarthritis.            /////////////////////////////////////////////////////////////////////            Lyme rule out    Lyme Total Antibody EIA    Negative        Negative  Comment: Lyme antibodies not detected. Reflex testing is not indicated.  No laboratory evidence of infection with B. burgdorferi (Lyme disease).  Negative results may occur in patients recently infected (less than  or equal to 14 days) with B. burgdorferi.  If recent infection is  suspected, repeat testing on a new sample collected in 7 to 14 days is  recommended.    CLINICAL DATA:  History of ACDF. Right-greater-than-left arm pain and numbness.       PATIENT SURVEYS:  FOTO 39/60     COGNITION: Overall cognitive status: Within functional limits for tasks assessed     SENSATION: WFL   POSTURE: No Significant postural limitations   PALPATION: Forearm flexors on right forearm and lateral epicondylitis on left forearm            CERVICAL ROM:    Active ROM A/PROM (deg) eval  Flexion 45  Extension 45  Right lateral flexion 45  Left lateral flexion 45  Right rotation 60  Left rotation 60   (Blank rows = not tested)   UPPER EXTREMITY ROM:        Active ROM Right eval Left eval  Shoulder flexion 180 180  Shoulder extension 60 60  Shoulder abduction 180  180  Shoulder adduction      Shoulder internal rotation 70 70  Shoulder external rotation 90 90  Elbow flexion 150 150  Elbow extension  60* 60*  Wrist flexion 80 80  Wrist extension 70 70  Wrist ulnar deviation 30 30  Wrist radial deviation 20 20  Wrist pronation      Wrist supination      (Blank rows = not tested)            UPPER EXTREMITY MMT:   MMT Right eval Left eval  Shoulder flexion 5 5  Shoulder extension      Shoulder abduction 5 5  Shoulder adduction      Shoulder extension      Shoulder internal rotation      Shoulder external rotation      Middle trapezius      Lower trapezius      Elbow flexion 5* 5*  Elbow extension 5* 5*  Wrist flexion 5 5  Wrist extension 5 5*  Wrist ulnar deviation 5 5  Wrist radial deviation 5 5  Wrist pronation 5* 5  Wrist supination 5 5  Grip strength       (Blank rows = not tested)   CERVICAL SPECIAL TESTS:    Cervical Radiculopathy Test cluster:   -rotation AROM <60 deg towards symptomatic side?: Negative bilateral    -Spurling's test: NT   -response to manual traction: NT   -upper limb tension test A: NT   FUNCTIONAL TESTS: None tested  Grip Strength ([place] 1984):   [email or web address]     TODAY'S TREATMENT:   12/18/21 UBE Level 1 Resistance Seat 7 for 6 min   *All exercises performed with pt wearing tennis brace on left forearm   Stress Ball Squeeze on LUE 3 x 10   Pronation and Supination with #1 DB on LUE 1 x 10  -NPS 6/10   Inspected fitting of brace to make sure there was proper fitting referencing following video:  [email or web address]  Wrist Extension Eccentric with # 1 DB 1 x 10    12/13/21 UBE Level 1 Resistance Seat Level 7 for 6 min   Left Wrist Ext Isometrics 3 sec holds x 5             -NPS increases from 5/10 to 8/10   Left Wrist Pronation/Supination #1 DB 1 x 10 -NPS from 5/10 to 6/10   Discussed use of tennis elbow brace as option given ongoing elbow pain localized to left lateral  epicondylitis.   Use of BP cuff as makeshift tennis elbow brace for left elbow   Left Wrist Extension with #1 DB 1 x 10 with BP cuff  -NPS from 5/10 to 6/10   Left Wrist Pronation/Supination #1 DB 1 x 10 -NPS from 6/10 to 6/10              LUE Ball Squeeze 1 x 10      12/11/21   THEREX    UBE level 1 for 7 min (3.5 min forward and 3.5 min backward)  Biceps Stretch on wall with arm abducting   Wrist Extension Eccentric with #1 DB 3 x 10   -NPS 9/10    Wrist Extension Eccentric with weight of fist 3 x 10   Wrist Extension Concentric with weight of fist 1 x 10  MANUAL   Massage of forearm flexors  -Pain of lateral epicondyle  Massage of triceps  Massage of biceps    Initial  Wrist Flexion 4 x 30 sec      PATIENT EDUCATION:  Education details: form and technique for appropriate exercise. Explanation of differentials  Person educated: Patient Education method: Explanation, Demonstration, Verbal cues, and Handouts Education comprehension: verbalized understanding, returned demonstration, and verbal cues required     HOME EXERCISE PROGRAM: Access Code: [email or web address] URL: [email or web address] Date: 12/18/2021 Prepared by: Bradly Chris  Exercises - Seated Eccentric Wrist Extension  - 1 x daily - 3 x weekly - 3 sets - 10 reps - Seated Pronation Supination with Dumbbell  - 1 x daily - 3 x weekly - 3 sets - 10 reps - Seated Shoulder Row with Anchored Resistance  - 1 x daily - 3 x weekly - 3 sets - 10 reps   Printed off following exercises from [email or web address]-[email or web address] because [place] website down    ASSESSMENT:   CLINICAL IMPRESSION:  Pt exhibits improved pain response with addition of tennis brace with NPS only reaching 6/10 and remaining at 5/10 post exercise. She is still experiencing significant left lateral epicondylitis pain post activity especially after work shifts, and she  will attempt exercises with tennis elbow brace throughout remainder of week to see if pain reduces. If pain does not reduce, then she will return to referring provider for additional evaluation and treatment. Forearm strengthening exercises progressed to include eccentric wrist extension from concentric with increased resistance. She will benefit from skilled PT to reduce forearm pain to complete job related tasks such as gripping sheets and moving patients with reduced pain  or discomfort.    ACTIVITY LIMITATIONS carrying, lifting, reach over head, and caring for others   PARTICIPATION LIMITATIONS: occupation and [place] [employer], Fitness, Profession, and 1-2 comorbidities: Obesity and T2DM  are also affecting patient's functional outcome.    REHAB POTENTIAL: Good   CLINICAL DECISION MAKING: Stable/uncomplicated   EVALUATION COMPLEXITY: Low     GOALS: Goals reviewed with patient? No   SHORT TERM GOALS: Target date: 12/19/2021    Pt will be independent with HEP in order to improve strength and balance in order to decrease fall risk and improve function at home and work. Baseline: Performing on her own  Goal status: ONGOING      LONG TERM GOALS: Target date: 01/30/2022   Patient will have improved function and activity level as evidenced by an increase in FOTO score by 10 points or more.  Baseline: 39 Goal status: ONGOING    2.  Patient will achieve a grip strength that is within standards for age and gender matched norms with 29 lbs for RUE and 28 lbs for LUE to carry out UE related tasks such as dressing and moving patients with less difficulty. Baseline: NT  Goal status: ONGOING        PLAN: PT FREQUENCY: 1-2x/week   PT DURATION: 8 weeks   PLANNED INTERVENTIONS: Therapeutic exercises, Therapeutic activity, Gait training, Patient/Family education, Joint manipulation, Joint mobilization, Dry Needling, Electrical stimulation, Spinal manipulation, Spinal  mobilization, Cryotherapy, Moist heat, Manual therapy, and Re-evaluation   PLAN FOR NEXT SESSION:   Test grip strength. FOTO. Making bed and pulling sheets with proper mechanics. Delve further into RUE supinator, work on forearm strengtheners biceps and pronator and supinators with tennis brace    Bradly Chris PT, DPT  12/18/2021, 9:04 AM   e

## 2021-12-20 ENCOUNTER — Ambulatory Visit: Payer: 59 | Admitting: Physical Therapy

## 2021-12-21 ENCOUNTER — Encounter: Payer: Self-pay | Admitting: Physical Therapy

## 2021-12-21 ENCOUNTER — Ambulatory Visit: Payer: 59 | Admitting: Physical Therapy

## 2021-12-21 DIAGNOSIS — M79632 Pain in left forearm: Secondary | ICD-10-CM | POA: Diagnosis not present

## 2021-12-21 DIAGNOSIS — M79631 Pain in right forearm: Secondary | ICD-10-CM | POA: Diagnosis not present

## 2021-12-21 NOTE — Therapy (Signed)
OUTPATIENT PHYSICAL THERAPY TREATMENT NOTE   Patient Name: Tricia Hill MRN: [id number] DOB:06-26-1970, 51 y.o., female Today's Date: 12/21/2021  PCP: Dr. Park Liter REFERRING PROVIDER: Dr. Park Liter   END OF SESSION:   PT End of Session - 12/21/21 0855     Visit Number 5    Number of Visits 16    Date for PT Re-Evaluation 01/30/22    Authorization Type [employer] [facility]    PT Start Time 0850    PT Stop Time 0930    PT Time Calculation (min) 40 min    Activity Tolerance Patient limited by pain    Behavior During Therapy [facility] for tasks assessed/performed             Past Medical History:  Diagnosis Date   Asthma    Diabetes mellitus without complication ([place]) 8786   GERD (gastroesophageal reflux disease)    Heart murmur    child   Hypertension    Shingles    Sleep apnea    USE CPAP   Past Surgical History:  Procedure Laterality Date   BACK SURGERY     CESAREAN SECTION     X 2   CHOLECYSTECTOMY N/A 01/18/2017   Procedure: LAPAROSCOPIC CHOLECYSTECTOMY WITH INTRAOPERATIVE CHOLANGIOGRAM;  Surgeon: Robert Bellow, MD;  Location: [facility] ORS;  Service: General;  Laterality: N/A;   COLONOSCOPY WITH PROPOFOL N/A 10/13/2020   Procedure: COLONOSCOPY WITH PROPOFOL;  Surgeon: Lin Landsman, MD;  Location: [facility] ENDOSCOPY;  Service: Gastroenterology;  Laterality: N/A;  COVID POSITIVE 09/30/2020   DIAGNOSTIC LAPAROSCOPY  2006   EXCISION OF ABDOMINAL WALL ENDOMETRIOMA   SPINAL FUSION  2012   C3 and C4   TOTAL ABDOMINAL HYSTERECTOMY     Partial   Patient Active Problem List   Diagnosis Date Noted   Anxiety 09/05/2021   HTN (hypertension) 04/16/2018   Fatty liver 12/07/2016   Microscopic hematuria 05/26/2015   OSA (obstructive sleep apnea) 01/31/2015   S/P hysterectomy 01/31/2015   Type 2 diabetes mellitus with hyperglycemia ([place]) 01/31/2015   Morbid obesity ([place]) 01/31/2015   Hyperlipidemia associated with type 2 diabetes mellitus ([place]) 01/31/2015     REFERRING DIAG: M54.12 (ICD-10-CM) - Cervical radiculopathy  THERAPY DIAG:  Pain in left forearm  Pain in right forearm  Rationale for Evaluation and Treatment Rehabilitation  PERTINENT HISTORY: Per Dr. Durenda Age note on 10/30/21   ARM PAIN Duration: R arm couple of months, L arm 3 weeks ago Location: middle upper arm down Mechanism of injury: unknown Onset: gradual Severity: moderate to severe  Quality:  aching pain Frequency: intermittent Radiation: into her wrist Aggravating factors: holding and grasping  Alleviating factors: nothing  Status: worse Treatments attempted: rest, ice, heat, APAP, ibuprofen, and aleve  Relief with NSAIDs?:  no Swelling: no Redness: no  Warmth: no Trauma: no Chest pain: no  Shortness of breath: no  Fever: no Decreased sensation: no Paresthesias: no Weakness: yes   Bilateral arm pain    -  Primary                    Will check labs and x-rays. Await results. Treat as needed.              Relevant Orders             Lyme Disease Serology w/Reflex             Babesia microti Antibody Panel  Rocky mtn spotted fvr abs pnl(IgG+IgM)             Sed Rate (ESR)             Ehrlichia Antibody Panel             Comprehensive metabolic panel             CBC with Differential/Platelet             VITAMIN D 25 Hydroxy (Vit-D Deficiency, Fractures)             DG Cervical Spine Complete             DG Shoulder Left             DG Shoulder Right              Tick bite, unspecified site, initial encounter                 Will check labs. Await results. Treat as needed.              Relevant Orders             Lyme Disease Serology w/Reflex             Babesia microti Antibody Panel             Rocky mtn spotted fvr abs pnl(IgG+IgM)             Ehrlichia Antibody Panel   Lyme rule out   PRECAUTIONS: None   SUBJECTIVE:  Pt reports that she continues to feel less pain in her left arm and this has been consistent throughout the  week.   PAIN:  Are you having pain? Yes: NPRS scale: 5/10 Pain location: Left lateral epicondyle  Pain description: achy and shooting  Aggravating factors: Lifting and pulling  Relieving factors: Nothing really helps   OBJECTIVE:    DIAGNOSTIC FINDINGS:                EXAM: CERVICAL SPINE - COMPLETE 4+ VIEW   COMPARISON:  Intraoperative cervical spine radiographs 09/08/2010   FINDINGS: Postsurgical changes are seen of C3 through C6 ACDF with associated intervertebral disc spacers. No periprosthetic lucency is seen to indicate hardware failure or loosening.   Moderate C2-3 and C6-7 disc space narrowing. Moderate anterior C2-3 endplate osteophytosis.   The atlantodens interval is intact.   On oblique views there appears to be bilateral C3-4 osseous neuroforaminal narrowing. 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   The lateral masses of C1 are symmetrically aligned with the dens on the open-mouth odontoid view.   No prevertebral soft tissue swelling.  The lung apices are clear.   IMPRESSION: 1. Status post C3 through C6 ACDF. 2. Moderate C2-3 and C6-7 degenerative disc and endplate changes. 3. 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               ////////////////////////////////////////////////////////////////////////////////   CLINICAL DATA:  Bilateral shoulder and arm pain.   EXAM: RIGHT SHOULDER - 2+ VIEW   COMPARISON:  None Available.   FINDINGS: Mild acromioclavicular joint space narrowing and peripheral osteophytosis. No significant osteoarthritis of the right glenohumeral joint. No acute fracture is seen. No dislocation. The visualized portion of the right lung is unremarkable. Partial visualization of ACDF hardware.   IMPRESSION: Mild right acromioclavicular osteoarthritis.     Electronically Signed   By: Yvonne Kendall M.D.   On: 10/31/2021 12:55    /////////////////////////////////////////////////////////////////////////  CLINICAL DATA:  Bilateral shoulder and arm pain.   EXAM: LEFT SHOULDER - 2+ VIEW   COMPARISON:  None Available.   FINDINGS: Mild acromioclavicular joint space narrowing and peripheral osteophytosis. No acute fracture or dislocation. Partial visualization of ACDF hardware. The visualized portion of the left lung is unremarkable.   IMPRESSION: Mild left acromioclavicular osteoarthritis.            /////////////////////////////////////////////////////////////////////            Lyme rule out    Lyme Total Antibody EIA    Negative        Negative  Comment: Lyme antibodies not detected. Reflex testing is not indicated.  No laboratory evidence of infection with B. burgdorferi (Lyme disease).  Negative results may occur in patients recently infected (less than  or equal to 14 days) with B. burgdorferi.  If recent infection is  suspected, repeat testing on a new sample collected in 7 to 14 days is  recommended.    CLINICAL DATA:  History of ACDF. Right-greater-than-left arm pain and numbness.       PATIENT SURVEYS:  FOTO 39/60     COGNITION: Overall cognitive status: Within functional limits for tasks assessed     SENSATION: WFL   POSTURE: No Significant postural limitations   PALPATION: Forearm flexors on right forearm and lateral epicondylitis on left forearm            CERVICAL ROM:    Active ROM A/PROM (deg) eval  Flexion 45  Extension 45  Right lateral flexion 45  Left lateral flexion 45  Right rotation 60  Left rotation 60   (Blank rows = not tested)   UPPER EXTREMITY ROM:        Active ROM Right eval Left eval  Shoulder flexion 180 180  Shoulder extension 60 60  Shoulder abduction 180  180  Shoulder adduction      Shoulder internal rotation 70 70  Shoulder external rotation 90 90  Elbow flexion 150 150  Elbow extension 60* 60*  Wrist flexion 80 80  Wrist  extension 70 70  Wrist ulnar deviation 30 30  Wrist radial deviation 20 20  Wrist pronation      Wrist supination      (Blank rows = not tested)            UPPER EXTREMITY MMT:   MMT Right eval Left eval  Shoulder flexion 5 5  Shoulder extension      Shoulder abduction 5 5  Shoulder adduction      Shoulder extension      Shoulder internal rotation      Shoulder external rotation      Middle trapezius      Lower trapezius      Elbow flexion 5* 5*  Elbow extension 5* 5*  Wrist flexion 5 5  Wrist extension 5 5*  Wrist ulnar deviation 5 5  Wrist radial deviation 5 5  Wrist pronation 5* 5  Wrist supination 5 5  Grip strength       (Blank rows = not tested)   CERVICAL SPECIAL TESTS:    Cervical Radiculopathy Test cluster:   -rotation AROM <60 deg towards symptomatic side?: Negative bilateral    -Spurling's test: NT   -response to manual traction: NT   -upper limb tension test A: NT   FUNCTIONAL TESTS: None tested    Grip Strength ([place] 1984):   [email or web address]     TODAY'S TREATMENT:   12/21/21 UBE  Level 1 Resistance Seat and Arms at 11 for 5 min  Grip Strength with Jamar R/L 18 kg (40 lbs)/ 14 kg (30 lb) Seated Rows with Green TB 1 x 10  Seated Rows with Blue TB  2 x 10  Eccentric Wrist Extension 1 lb 3 x 10  -min VC for 1 sec concentric and 3 sec eccentric  Prontation and Supination on LUE # 2 LB    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 Using pronated LUE and supinated RUE to avoid overstretching tendons on LUE.   Recommended wearing tennis elbow brace at work to continue to offset load on extensor tendons   12/18/21 UBE Level 1 Resistance Seat 7 for 6 min   *All exercises performed with pt wearing tennis brace on left forearm   Stress Ball Squeeze on LUE 3 x 10   Pronation and Supination with #1 DB on LUE 1 x 10  -NPS  6/10   Inspected fitting of brace to make sure there was proper fitting referencing following video:  [email or web address]  Wrist Extension Eccentric with # 1 DB 1 x 10    12/13/21 UBE Level 1 Resistance Seat Level 7 for 6 min   Left Wrist Ext Isometrics 3 sec holds x 5             -NPS increases from 5/10 to 8/10   Left Wrist Pronation/Supination #1 DB 1 x 10 -NPS from 5/10 to 6/10   Discussed use of tennis elbow brace as option given ongoing elbow pain localized to left lateral epicondylitis.   Use of BP cuff as makeshift tennis elbow brace for left elbow   Left Wrist Extension with #1 DB 1 x 10 with BP cuff  -NPS from 5/10 to 6/10   Left Wrist Pronation/Supination #1 DB 1 x 10 -NPS from 6/10 to 6/10              LUE Ball Squeeze 1 x 10   12/11/21   THEREX    UBE level 1 for 7 min (3.5 min forward and 3.5 min backward)  Biceps Stretch on wall with arm abducting   Wrist Extension Eccentric with #1 DB 3 x 10   -NPS 9/10    Wrist Extension Eccentric with weight of fist 3 x 10   Wrist Extension Concentric with weight of fist 1 x 10  MANUAL   Massage of forearm flexors  -Pain of lateral epicondyle  Massage of triceps  Massage of biceps    Initial  Wrist Flexion 4 x 30 sec      PATIENT EDUCATION:  Education details: form and technique for appropriate exercise. Explanation of differentials  Person educated: Patient Education method: Explanation, Demonstration, Verbal cues, and Handouts Education comprehension: verbalized understanding, returned demonstration, and verbal cues required     HOME EXERCISE PROGRAM: Access Code: [email or web address] URL: [email or web address] Date: 12/21/2021 Prepared by: Bradly Chris  Exercises - Seated Eccentric Wrist Extension  - 1 x daily - 3 x weekly - 3 sets - 10 reps - Seated Pronation Supination with Dumbbell  - 1 x daily - 3 x weekly - 3 sets - 10 reps - Seated Shoulder Row with Anchored  Resistance  - 1 x daily - 3 x weekly - 3 sets - 10 reps   Printed off following exercises from [email or web address]-[email or web address]-program-[email or web address]-[email or web address] because MedBridge website down    ASSESSMENT:   CLINICAL IMPRESSION: Pt continues to show improved pain response to activity with use of tennis  elbow brace and progression of forearm extensor exercises. PT recommends use of tennis brace while patient is at work to offset load on tendons from work tasks. 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  ACTIVITY LIMITATIONS carrying, lifting, reach over head, and caring for others   PARTICIPATION LIMITATIONS: occupation and [place] [employer], Fitness, Profession, and 1-2 comorbidities: Obesity and T2DM  are also affecting patient's functional outcome.    REHAB POTENTIAL: Good   CLINICAL DECISION MAKING: Stable/uncomplicated   EVALUATION COMPLEXITY: Low     GOALS: Goals reviewed with patient? No   SHORT TERM GOALS: Target date: 12/19/2021    Pt will be independent with HEP in order to improve strength and balance in order to decrease fall risk and improve function at home and work. Baseline: Performing on her own  Goal status: ONGOING      LONG TERM GOALS: Target date: 01/30/2022   Patient will have improved function and activity level as evidenced by an increase in FOTO score by 10 points or more.  Baseline: 39 Goal status: ONGOING    2.  Patient will achieve a grip strength that is within standards for age and gender matched norms with 29 lbs for RUE and 28 lbs for LUE to carry out UE related tasks such as dressing and moving patients with less difficulty. Baseline: Grip Strength R/L 18 kg (40 lbs)/ 14 kg (30 lb) Goal status: Deferred   3. 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 Baseline: Grip Strength R/L 18 kg (40 lbs)/ 14 kg (30 lb) Goal status: Ongoing      PLAN: PT FREQUENCY: 1-2x/week   PT DURATION: 8 weeks   PLANNED INTERVENTIONS: Therapeutic exercises, Therapeutic activity, Gait training, Patient/Family education, Joint manipulation, Joint mobilization, Dry Needling, Electrical stimulation, Spinal manipulation, Spinal mobilization, Cryotherapy, Moist heat, Manual therapy, and Re-evaluation   PLAN FOR NEXT SESSION:   FOTO.  Delve further into RUE supinator, work on forearm strengtheners biceps and pronator and supinators with tennis brace. Progress parascapular strengthening exercises    Bradly Chris PT, DPT  12/21/2021, 8:56 AM   e

## 2021-12-25 ENCOUNTER — Ambulatory Visit: Payer: 59 | Admitting: Physical Therapy

## 2021-12-27 ENCOUNTER — Ambulatory Visit: Payer: 59 | Admitting: Physical Therapy

## 2021-12-27 DIAGNOSIS — M79632 Pain in left forearm: Secondary | ICD-10-CM | POA: Diagnosis not present

## 2021-12-27 DIAGNOSIS — M79631 Pain in right forearm: Secondary | ICD-10-CM

## 2021-12-27 NOTE — Therapy (Signed)
OUTPATIENT PHYSICAL THERAPY TREATMENT NOTE   Patient Name: Tricia Hill MRN: [id number] DOB:1970/08/03, 51 y.o., female Today's Date: 12/27/2021  PCP: Dr. Park Liter REFERRING PROVIDER: Dr. Park Liter   END OF SESSION:   PT End of Session - 12/27/21 1108     Visit Number 6    Number of Visits 16    Date for PT Re-Evaluation 01/30/22    Authorization Type [employer] [facility]    PT Start Time 1105    PT Stop Time 6270    PT Time Calculation (min) 40 min    Activity Tolerance Patient tolerated treatment well    Behavior During Therapy [facility] for tasks assessed/performed             Past Medical History:  Diagnosis Date   Asthma    Diabetes mellitus without complication ([place]) 3500   GERD (gastroesophageal reflux disease)    Heart murmur    child   Hypertension    Shingles    Sleep apnea    USE CPAP   Past Surgical History:  Procedure Laterality Date   BACK SURGERY     CESAREAN SECTION     X 2   CHOLECYSTECTOMY N/A 01/18/2017   Procedure: LAPAROSCOPIC CHOLECYSTECTOMY WITH INTRAOPERATIVE CHOLANGIOGRAM;  Surgeon: Robert Bellow, MD;  Location: [facility] ORS;  Service: General;  Laterality: N/A;   COLONOSCOPY WITH PROPOFOL N/A 10/13/2020   Procedure: COLONOSCOPY WITH PROPOFOL;  Surgeon: Lin Landsman, MD;  Location: [facility] ENDOSCOPY;  Service: Gastroenterology;  Laterality: N/A;  COVID POSITIVE 09/30/2020   DIAGNOSTIC LAPAROSCOPY  2006   EXCISION OF ABDOMINAL WALL ENDOMETRIOMA   SPINAL FUSION  2012   C3 and C4   TOTAL ABDOMINAL HYSTERECTOMY     Partial   Patient Active Problem List   Diagnosis Date Noted   Anxiety 09/05/2021   HTN (hypertension) 04/16/2018   Fatty liver 12/07/2016   Microscopic hematuria 05/26/2015   OSA (obstructive sleep apnea) 01/31/2015   S/P hysterectomy 01/31/2015   Type 2 diabetes mellitus with hyperglycemia ([place]) 01/31/2015   Morbid obesity ([place]) 01/31/2015   Hyperlipidemia associated with type 2 diabetes mellitus ([place]) 01/31/2015     REFERRING DIAG: M54.12 (ICD-10-CM) - Cervical radiculopathy  THERAPY DIAG:  Pain in left forearm  Pain in right forearm  Rationale for Evaluation and Treatment Rehabilitation  PERTINENT HISTORY: Per Dr. Durenda Age note on 10/30/21   ARM PAIN Duration: R arm couple of months, L arm 3 weeks ago Location: middle upper arm down Mechanism of injury: unknown Onset: gradual Severity: moderate to severe  Quality:  aching pain Frequency: intermittent Radiation: into her wrist Aggravating factors: holding and grasping  Alleviating factors: nothing  Status: worse Treatments attempted: rest, ice, heat, APAP, ibuprofen, and aleve  Relief with NSAIDs?:  no Swelling: no Redness: no  Warmth: no Trauma: no Chest pain: no  Shortness of breath: no  Fever: no Decreased sensation: no Paresthesias: no Weakness: yes   Bilateral arm pain    -  Primary                    Will check labs and x-rays. Await results. Treat as needed.              Relevant Orders             Lyme Disease Serology w/Reflex             Babesia microti Antibody Panel  Rocky mtn spotted fvr abs pnl(IgG+IgM)             Sed Rate (ESR)             Ehrlichia Antibody Panel             Comprehensive metabolic panel             CBC with Differential/Platelet             VITAMIN D 25 Hydroxy (Vit-D Deficiency, Fractures)             DG Cervical Spine Complete             DG Shoulder Left             DG Shoulder Right              Tick bite, unspecified site, initial encounter                 Will check labs. Await results. Treat as needed.              Relevant Orders             Lyme Disease Serology w/Reflex             Babesia microti Antibody Panel             Rocky mtn spotted fvr abs pnl(IgG+IgM)             Ehrlichia Antibody Panel   Lyme rule out   PRECAUTIONS: None   SUBJECTIVE:  Pt reports ongoing improved left forearm pain that is no longer radiating and now remains localized to the  left lateral epicondyle.   PAIN:  Are you having pain? Yes: NPRS scale: 3/10 Pain location: Left lateral epicondyle  Pain description: achy and shooting  Aggravating factors: Lifting and pulling  Relieving factors: Nothing really helps   OBJECTIVE:    DIAGNOSTIC FINDINGS:                EXAM: CERVICAL SPINE - COMPLETE 4+ VIEW   COMPARISON:  Intraoperative cervical spine radiographs 09/08/2010   FINDINGS: Postsurgical changes are seen of C3 through C6 ACDF with associated intervertebral disc spacers. No periprosthetic lucency is seen to indicate hardware failure or loosening.   Moderate C2-3 and C6-7 disc space narrowing. Moderate anterior C2-3 endplate osteophytosis.   The atlantodens interval is intact.   On oblique views there appears to be bilateral C3-4 osseous neuroforaminal narrowing. 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   The lateral masses of C1 are symmetrically aligned with the dens on the open-mouth odontoid view.   No prevertebral soft tissue swelling.  The lung apices are clear.   IMPRESSION: 1. Status post C3 through C6 ACDF. 2. Moderate C2-3 and C6-7 degenerative disc and endplate changes. 3. 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               ////////////////////////////////////////////////////////////////////////////////   CLINICAL DATA:  Bilateral shoulder and arm pain.   EXAM: RIGHT SHOULDER - 2+ VIEW   COMPARISON:  None Available.   FINDINGS: Mild acromioclavicular joint space narrowing and peripheral osteophytosis. No significant osteoarthritis of the right glenohumeral joint. No acute fracture is seen. No dislocation. The visualized portion of the right lung is unremarkable. Partial visualization of ACDF hardware.   IMPRESSION: Mild right acromioclavicular osteoarthritis.     Electronically Signed   By: Yvonne Kendall M.D.   On: 10/31/2021 12:55    /////////////////////////////////////////////////////////////////////////  CLINICAL DATA:  Bilateral shoulder and arm pain.   EXAM: LEFT SHOULDER - 2+ VIEW   COMPARISON:  None Available.   FINDINGS: Mild acromioclavicular joint space narrowing and peripheral osteophytosis. No acute fracture or dislocation. Partial visualization of ACDF hardware. The visualized portion of the left lung is unremarkable.   IMPRESSION: Mild left acromioclavicular osteoarthritis.            /////////////////////////////////////////////////////////////////////            Lyme rule out    Lyme Total Antibody EIA    Negative        Negative  Comment: Lyme antibodies not detected. Reflex testing is not indicated.  No laboratory evidence of infection with B. burgdorferi (Lyme disease).  Negative results may occur in patients recently infected (less than  or equal to 14 days) with B. burgdorferi.  If recent infection is  suspected, repeat testing on a new sample collected in 7 to 14 days is  recommended.    CLINICAL DATA:  History of ACDF. Right-greater-than-left arm pain and numbness.       PATIENT SURVEYS:  FOTO 39/60     COGNITION: Overall cognitive status: Within functional limits for tasks assessed     SENSATION: WFL   POSTURE: No Significant postural limitations   PALPATION: Forearm flexors on right forearm and lateral epicondylitis on left forearm            CERVICAL ROM:    Active ROM A/PROM (deg) eval  Flexion 45  Extension 45  Right lateral flexion 45  Left lateral flexion 45  Right rotation 60  Left rotation 60   (Blank rows = not tested)   UPPER EXTREMITY ROM:        Active ROM Right eval Left eval  Shoulder flexion 180 180  Shoulder extension 60 60  Shoulder abduction 180  180  Shoulder adduction      Shoulder internal rotation 70 70  Shoulder external rotation 90 90  Elbow flexion 150 150  Elbow extension 60* 60*  Wrist flexion 80 80  Wrist  extension 70 70  Wrist ulnar deviation 30 30  Wrist radial deviation 20 20  Wrist pronation      Wrist supination      (Blank rows = not tested)            UPPER EXTREMITY MMT:   MMT Right eval Left eval  Shoulder flexion 5 5  Shoulder extension      Shoulder abduction 5 5  Shoulder adduction      Shoulder extension      Shoulder internal rotation      Shoulder external rotation      Middle trapezius      Lower trapezius      Elbow flexion 5* 5*  Elbow extension 5* 5*  Wrist flexion 5 5  Wrist extension 5 5*  Wrist ulnar deviation 5 5  Wrist radial deviation 5 5  Wrist pronation 5* 5  Wrist supination 5 5  Grip strength       (Blank rows = not tested)   CERVICAL SPECIAL TESTS:    Cervical Radiculopathy Test cluster:   -rotation AROM <60 deg towards symptomatic side?: Negative bilateral    -Spurling's test: NT   -response to manual traction: NT   -upper limb tension test A: NT   FUNCTIONAL TESTS: None tested    Grip Strength ([place]etz 1984):   [email or web address]     TODAY'S TREATMENT:   12/23/21 UBE  with 3 resistance with seat and arms at 8 for 6 min  Left Wrist Flexor Stretch 2 x 30 sec  FOTO: 60 Seated Rows Black TB 3 x 10  Seated ER at 0 deg abduction with yellow TB 3 x 10   12/21/21 UBE Level 1 Resistance Seat and Arms at 11 for 5 min  Grip Strength with Jamar R/L 18 kg (40 lbs)/ 14 kg (30 lb) Seated Rows with Green TB 1 x 10  Seated Rows with Blue TB  2 x 10  Eccentric Wrist Extension 1 lb 3 x 10  -min VC for 1 sec concentric and 3 sec eccentric  Prontation and Supination on LUE # 2 LB    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 Using pronated LUE and supinated RUE to avoid overstretching tendons on LUE.   Recommended wearing tennis elbow brace at work to continue to offset load on extensor tendons   12/18/21 UBE Level 1  Resistance Seat 7 for 6 min   *All exercises performed with pt wearing tennis brace on left forearm   Stress Ball Squeeze on LUE 3 x 10   Pronation and Supination with #1 DB on LUE 1 x 10  -NPS 6/10   Inspected fitting of brace to make sure there was proper fitting referencing following video:  [email or web address]  Wrist Extension Eccentric with # 1 DB 1 x 10    12/13/21 UBE Level 1 Resistance Seat Level 7 for 6 min   Left Wrist Ext Isometrics 3 sec holds x 5             -NPS increases from 5/10 to 8/10   Left Wrist Pronation/Supination #1 DB 1 x 10 -NPS from 5/10 to 6/10   Discussed use of tennis elbow brace as option given ongoing elbow pain localized to left lateral epicondylitis.   Use of BP cuff as makeshift tennis elbow brace for left elbow   Left Wrist Extension with #1 DB 1 x 10 with BP cuff  -NPS from 5/10 to 6/10   Left Wrist Pronation/Supination #1 DB 1 x 10 -NPS from 6/10 to 6/10              LUE Ball Squeeze 1 x 10   12/11/21   THEREX    UBE level 1 for 7 min (3.5 min forward and 3.5 min backward)  Biceps Stretch on wall with arm abducting   Wrist Extension Eccentric with #1 DB 3 x 10   -NPS 9/10    Wrist Extension Eccentric with weight of fist 3 x 10   Wrist Extension Concentric with weight of fist 1 x 10  MANUAL   Massage of forearm flexors  -Pain of lateral epicondyle  Massage of triceps  Massage of biceps    Initial  Wrist Flexion 4 x 30 sec      PATIENT EDUCATION:  Education details: form and technique for appropriate exercise. Explanation of differentials  Person educated: Patient Education method: Explanation, Demonstration, Verbal cues, and Handouts Education comprehension: verbalized understanding, returned demonstration, and verbal cues required     HOME EXERCISE PROGRAM: Access Code: [email or web address] URL: [email or web address] Date: 12/27/2021 Prepared by: Bradly Chris  Exercises - Seated  Eccentric Wrist Extension  - 1 x daily - 3 x weekly - 3 sets - 10 reps - Seated Pronation Supination with Dumbbell  - 1 x daily - 3 x weekly - 3 sets - 10 reps - Seated Shoulder Row with Anchored Resistance  -  1 x daily - 3 x weekly - 3 sets - 10 reps - Quick Finger Spreading with Rubber Band  - 1 x daily - 3 x weekly - 3 sets - 10 reps - Putty Squeezes  - 1 x daily - 3 x weekly - 3 sets - 10 reps - Seated Wrist Flexion Stretch  - 1 x daily - 7 x weekly - 1 sets - 3 reps - 30 hold - Shoulder External Rotation and Scapular Retraction with Resistance  - 1 x daily - 3 x weekly - 3 sets - 10 reps  Printed off following exercises from [email or web address]-[email or web address] because Med[employer] website down    ASSESSMENT:   CLINICAL IMPRESSION:  Pt continues to show improvement with pain response to activity with left forearm. She was able to complete all exercises without an increase in her pain and without using Tennis Brace. She also notes no increase after work shift which is another sign of improvement and an improved FOTO score demonstrating improved perception of function. Exercises progressed to include increased resistance for parascapular strengthening exercises. She will be on vacation for the next couple of weeks. She has been given HEP with exercises to maintain gains.  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   ACTIVITY LIMITATIONS carrying, lifting, reach over head, and caring for others   PARTICIPATION LIMITATIONS: occupation and [place] Age, Fitness, Profession, and 1-2 comorbidities: Obesity and T2DM  are also affecting patient's functional outcome.    REHAB POTENTIAL: Good   CLINICAL DECISION MAKING: Stable/uncomplicated   EVALUATION COMPLEXITY: Low      GOALS: Goals reviewed with patient? No   SHORT TERM GOALS: Target date: 12/19/2021    Pt will be independent with HEP in order to improve strength and balance in order to decrease fall risk and improve function at home and work. Baseline: Performing on her own  Goal status: ONGOING      LONG TERM GOALS: Target date: 01/30/2022   Patient will have improved function and activity level as evidenced by an increase in FOTO score by 10 points or more.  Baseline: 39 12/27/21: 60  Goal status: ONGOING    2.  Patient will achieve a grip strength that is within standards for age and gender matched norms with 29 lbs for RUE and 28 lbs for LUE to carry out UE related tasks such as dressing and moving patients with less difficulty. Baseline: Grip Strength R/L 18 kg (40 lbs)/ 14 kg (30 lb) Goal status: Deferred   3. 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 Baseline: Grip Strength R/L 18 kg (40 lbs)/ 14 kg (30 lb) Goal status: Ongoing      PLAN: PT FREQUENCY: 1-2x/week   PT DURATION: 8 weeks   PLANNED INTERVENTIONS: Therapeutic exercises, Therapeutic activity, Gait training, Patient/Family education, Joint manipulation, Joint mobilization, Dry Needling, Electrical stimulation, Spinal manipulation, Spinal mobilization, Cryotherapy, Moist heat, Manual therapy, and Re-evaluation   PLAN FOR NEXT SESSION:   Delve further into RUE supinator, work on forearm strengtheners biceps and pronator and supinators with tennis brace. Progress parascapular strengthening exercises    Bradly Chris PT, DPT  12/27/2021, 11:11 AM   e

## 2022-01-01 ENCOUNTER — Encounter: Payer: 59 | Admitting: Physical Therapy

## 2022-01-03 ENCOUNTER — Encounter: Payer: 59 | Admitting: Physical Therapy

## 2022-01-08 ENCOUNTER — Encounter: Payer: 59 | Admitting: Physical Therapy

## 2022-01-10 ENCOUNTER — Encounter: Payer: 59 | Admitting: Physical Therapy

## 2022-01-15 ENCOUNTER — Encounter: Payer: 59 | Admitting: Physical Therapy

## 2022-01-16 ENCOUNTER — Other Ambulatory Visit: Payer: Self-pay

## 2022-01-17 ENCOUNTER — Encounter: Payer: 59 | Admitting: Physical Therapy

## 2022-01-22 ENCOUNTER — Encounter: Payer: Self-pay | Admitting: Physical Therapy

## 2022-01-22 ENCOUNTER — Ambulatory Visit: Payer: 59 | Attending: Family Medicine | Admitting: Physical Therapy

## 2022-01-22 DIAGNOSIS — M79632 Pain in left forearm: Secondary | ICD-10-CM | POA: Insufficient documentation

## 2022-01-22 DIAGNOSIS — M79631 Pain in right forearm: Secondary | ICD-10-CM | POA: Diagnosis not present

## 2022-01-22 NOTE — Therapy (Signed)
OUTPATIENT PHYSICAL THERAPY TREATMENT NOTE   Patient Name: Tricia Hill MRN: [id number] DOB:1971-03-17, 51 y.o., female Today's Date: 01/22/2022  PCP: Dr. Park Liter REFERRING PROVIDER: Dr. Park Liter   END OF SESSION:   PT End of Session - 01/22/22 0853     Visit Number 7    Number of Visits 16    Date for PT Re-Evaluation 01/30/22    Authorization Type [employer] [facility]    PT Start Time 0850    PT Stop Time 0930    PT Time Calculation (min) 40 min    Activity Tolerance Patient tolerated treatment well    Behavior During Therapy [facility] for tasks assessed/performed             Past Medical History:  Diagnosis Date   Asthma    Diabetes mellitus without complication ([place]) 3500   GERD (gastroesophageal reflux disease)    Heart murmur    child   Hypertension    Shingles    Sleep apnea    USE CPAP   Past Surgical History:  Procedure Laterality Date   BACK SURGERY     CESAREAN SECTION     X 2   CHOLECYSTECTOMY N/A 01/18/2017   Procedure: LAPAROSCOPIC CHOLECYSTECTOMY WITH INTRAOPERATIVE CHOLANGIOGRAM;  Surgeon: Robert Bellow, MD;  Location: [facility] ORS;  Service: General;  Laterality: N/A;   COLONOSCOPY WITH PROPOFOL N/A 10/13/2020   Procedure: COLONOSCOPY WITH PROPOFOL;  Surgeon: Lin Landsman, MD;  Location: [facility] ENDOSCOPY;  Service: Gastroenterology;  Laterality: N/A;  COVID POSITIVE 09/30/2020   DIAGNOSTIC LAPAROSCOPY  2006   EXCISION OF ABDOMINAL WALL ENDOMETRIOMA   SPINAL FUSION  2012   C3 and C4   TOTAL ABDOMINAL HYSTERECTOMY     Partial   Patient Active Problem List   Diagnosis Date Noted   Anxiety 09/05/2021   HTN (hypertension) 04/16/2018   Fatty liver 12/07/2016   Microscopic hematuria 05/26/2015   OSA (obstructive sleep apnea) 01/31/2015   S/P hysterectomy 01/31/2015   Type 2 diabetes mellitus with hyperglycemia ([place]) 01/31/2015   Morbid obesity ([place]) 01/31/2015   Hyperlipidemia associated with type 2 diabetes mellitus ([place]) 01/31/2015     REFERRING DIAG: M54.12 (ICD-10-CM) - Cervical radiculopathy  THERAPY DIAG:  Pain in left forearm  Pain in right forearm  Rationale for Evaluation and Treatment Rehabilitation  PERTINENT HISTORY: Per Dr. Durenda Age note on 10/30/21   ARM PAIN Duration: R arm couple of months, L arm 3 weeks ago Location: middle upper arm down Mechanism of injury: unknown Onset: gradual Severity: moderate to severe  Quality:  aching pain Frequency: intermittent Radiation: into her wrist Aggravating factors: holding and grasping  Alleviating factors: nothing  Status: worse Treatments attempted: rest, ice, heat, APAP, ibuprofen, and aleve  Relief with NSAIDs?:  no Swelling: no Redness: no  Warmth: no Trauma: no Chest pain: no  Shortness of breath: no  Fever: no Decreased sensation: no Paresthesias: no Weakness: yes   Bilateral arm pain    -  Primary                    Will check labs and x-rays. Await results. Treat as needed.              Relevant Orders             Lyme Disease Serology w/Reflex             Babesia microti Antibody Panel  Rocky mtn spotted fvr abs pnl(IgG+IgM)             Sed Rate (ESR)             Ehrlichia Antibody Panel             Comprehensive metabolic panel             CBC with Differential/Platelet             VITAMIN D 25 Hydroxy (Vit-D Deficiency, Fractures)             DG Cervical Spine Complete             DG Shoulder Left             DG Shoulder Right              Tick bite, unspecified site, initial encounter                 Will check labs. Await results. Treat as needed.              Relevant Orders             Lyme Disease Serology w/Reflex             Babesia microti Antibody Panel             Rocky mtn spotted fvr abs pnl(IgG+IgM)             Ehrlichia Antibody Panel   Lyme rule out   PRECAUTIONS: None   SUBJECTIVE:  Pt reports an improvement in left lateral epicondyle in the past week.   PAIN:  Are you having pain? Yes:  NPRS scale: 1/10 Pain location: Left lateral epicondyle  Pain description: achy and shooting  Aggravating factors: Lifting and pulling  Relieving factors: Nothing really helps   OBJECTIVE:    DIAGNOSTIC FINDINGS:                EXAM: CERVICAL SPINE - COMPLETE 4+ VIEW   COMPARISON:  Intraoperative cervical spine radiographs 09/08/2010   FINDINGS: Postsurgical changes are seen of C3 through C6 ACDF with associated intervertebral disc spacers. No periprosthetic lucency is seen to indicate hardware failure or loosening.   Moderate C2-3 and C6-7 disc space narrowing. Moderate anterior C2-3 endplate osteophytosis.   The atlantodens interval is intact.   On oblique views there appears to be bilateral C3-4 osseous neuroforaminal narrowing. 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   The lateral masses of C1 are symmetrically aligned with the dens on the open-mouth odontoid view.   No prevertebral soft tissue swelling.  The lung apices are clear.   IMPRESSION: 1. Status post C3 through C6 ACDF. 2. Moderate C2-3 and C6-7 degenerative disc and endplate changes. 3. 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               ////////////////////////////////////////////////////////////////////////////////   CLINICAL DATA:  Bilateral shoulder and arm pain.   EXAM: RIGHT SHOULDER - 2+ VIEW   COMPARISON:  None Available.   FINDINGS: Mild acromioclavicular joint space narrowing and peripheral osteophytosis. No significant osteoarthritis of the right glenohumeral joint. No acute fracture is seen. No dislocation. The visualized portion of the right lung is unremarkable. Partial visualization of ACDF hardware.   IMPRESSION: Mild right acromioclavicular osteoarthritis.     Electronically Signed   By: Yvonne Kendall M.D.   On: 10/31/2021 12:55   /////////////////////////////////////////////////////////////////////////   CLINICAL DATA:  Bilateral shoulder  and arm pain.  EXAM: LEFT SHOULDER - 2+ VIEW   COMPARISON:  None Available.   FINDINGS: Mild acromioclavicular joint space narrowing and peripheral osteophytosis. No acute fracture or dislocation. Partial visualization of ACDF hardware. The visualized portion of the left lung is unremarkable.   IMPRESSION: Mild left acromioclavicular osteoarthritis.            /////////////////////////////////////////////////////////////////////            Lyme rule out    Lyme Total Antibody EIA    Negative        Negative  Comment: Lyme antibodies not detected. Reflex testing is not indicated.  No laboratory evidence of infection with B. burgdorferi (Lyme disease).  Negative results may occur in patients recently infected (less than  or equal to 14 days) with B. burgdorferi.  If recent infection is  suspected, repeat testing on a new sample collected in 7 to 14 days is  recommended.    CLINICAL DATA:  History of ACDF. Right-greater-than-left arm pain and numbness.       PATIENT SURVEYS:  FOTO 39/60     COGNITION: Overall cognitive status: Within functional limits for tasks assessed     SENSATION: WFL   POSTURE: No Significant postural limitations   PALPATION: Forearm flexors on right forearm and lateral epicondylitis on left forearm            CERVICAL ROM:    Active ROM A/PROM (deg) eval  Flexion 45  Extension 45  Right lateral flexion 45  Left lateral flexion 45  Right rotation 60  Left rotation 60   (Blank rows = not tested)   UPPER EXTREMITY ROM:        Active ROM Right eval Left eval  Shoulder flexion 180 180  Shoulder extension 60 60  Shoulder abduction 180  180  Shoulder adduction      Shoulder internal rotation 70 70  Shoulder external rotation 90 90  Elbow flexion 150 150  Elbow extension 60* 60*  Wrist flexion 80 80  Wrist extension 70 70  Wrist ulnar deviation 30 30  Wrist radial deviation 20 20  Wrist pronation      Wrist supination       (Blank rows = not tested)            UPPER EXTREMITY MMT:   MMT Right eval Left eval  Shoulder flexion 5 5  Shoulder extension      Shoulder abduction 5 5  Shoulder adduction      Shoulder extension      Shoulder internal rotation      Shoulder external rotation      Middle trapezius      Lower trapezius      Elbow flexion 5* 5*  Elbow extension 5* 5*  Wrist flexion 5 5  Wrist extension 5 5*  Wrist ulnar deviation 5 5  Wrist radial deviation 5 5  Wrist pronation 5* 5  Wrist supination 5 5  Grip strength       (Blank rows = not tested)   CERVICAL SPECIAL TESTS:    Cervical Radiculopathy Test cluster:   -rotation AROM <60 deg towards symptomatic side?: Negative bilateral    -Spurling's test: NT   -response to manual traction: NT   -upper limb tension test A: NT   FUNCTIONAL TESTS: None tested    Grip Strength ([place] 1984):   [email or web address]     TODAY'S TREATMENT:  01/22/22  UBE with 2 resistance- 9 min   FOTO: 60/60 Eccentric  wrist flexion with #2 DB on LUE 3 x 10  Review of tennis elbow education videos via [employer]  Grip Strength R/L 60 lbs/40 lbs   12/23/21 UBE with 3 resistance with seat and arms at 8 for 6 min  Left Wrist Flexor Stretch 2 x 30 sec  FOTO: 60 Seated Rows Black TB 3 x 10  Seated ER at 0 deg abduction with yellow TB 3 x 10   12/21/21 UBE Level 1 Resistance Seat and Arms at 11 for 5 min  Grip Strength with Jamar R/L 18 kg (40 lbs)/ 14 kg (30 lb) Seated Rows with Green TB 1 x 10  Seated Rows with Blue TB  2 x 10  Eccentric Wrist Extension 1 lb 3 x 10  -min VC for 1 sec concentric and 3 sec eccentric  Prontation and Supination on LUE # 2 LB    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 Using pronated LUE and supinated RUE to avoid overstretching tendons on LUE.   Recommended wearing tennis elbow brace at  work to continue to offset load on extensor tendons   12/18/21 UBE Level 1 Resistance Seat 7 for 6 min   *All exercises performed with pt wearing tennis brace on left forearm   Stress Ball Squeeze on LUE 3 x 10   Pronation and Supination with #1 DB on LUE 1 x 10  -NPS 6/10   Inspected fitting of brace to make sure there was proper fitting referencing following video:  [email or web address]  Wrist Extension Eccentric with # 1 DB 1 x 10    12/13/21 UBE Level 1 Resistance Seat Level 7 for 6 min   Left Wrist Ext Isometrics 3 sec holds x 5             -NPS increases from 5/10 to 8/10   Left Wrist Pronation/Supination #1 DB 1 x 10 -NPS from 5/10 to 6/10   Discussed use of tennis elbow brace as option given ongoing elbow pain localized to left lateral epicondylitis.   Use of BP cuff as makeshift tennis elbow brace for left elbow   Left Wrist Extension with #1 DB 1 x 10 with BP cuff  -NPS from 5/10 to 6/10   Left Wrist Pronation/Supination #1 DB 1 x 10 -NPS from 6/10 to 6/10              LUE Ball Squeeze 1 x 10    PATIENT EDUCATION:  Education details: form and technique for appropriate exercise. Explanation of differentials  Person educated: Patient Education method: Explanation, Demonstration, Verbal cues, and Handouts Education comprehension: verbalized understanding, returned demonstration, and verbal cues required     HOME EXERCISE PROGRAM: Access Code: [email or web address] URL: [email or web address] Date: 01/22/2022 Prepared by: Bradly Chris  Exercises - Seated Eccentric Wrist Extension  - 1 x daily - 3 x weekly - 3 sets - 10 reps - Seated Pronation Supination with Dumbbell  - 1 x daily - 3 x weekly - 3 sets - 10 reps - Seated Shoulder Row with Anchored Resistance  - 1 x daily - 3 x weekly - 3 sets - 10 reps - Quick Finger Spreading with Rubber Band  - 1 x daily - 3 x weekly - 3 sets - 10 reps - Putty Squeezes  - 1 x daily - 3 x weekly -  3 sets - 10 reps - Seated Wrist Flexion Stretch  - 1 x daily - 7 x weekly - 1 sets - 3  reps - 30 hold - Shoulder External Rotation and Scapular Retraction with Resistance  - 1 x daily - 3 x weekly - 3 sets - 10 reps  Patient Education - Tennis Elbow - What Is Lateral Elbow Pain?   ASSESSMENT:   CLINICAL IMPRESSION:  Pt exhibits an improvement in grip strength, elbow pain, and her perception of function since initial eval. 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 Pt also advised to ice and use pain medication prn for treatment of increase in pain after work.  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   ACTIVITY LIMITATIONS carrying, lifting, reach over head, and caring for others   PARTICIPATION LIMITATIONS: occupation and [place] Age, Fitness, Profession, and 1-2 comorbidities: Obesity and T2DM  are also affecting patient's functional outcome.    REHAB POTENTIAL: Good   CLINICAL DECISION MAKING: Stable/uncomplicated   EVALUATION COMPLEXITY: Low     GOALS: Goals reviewed with patient? No   SHORT TERM GOALS: Target date: 12/19/2021    Pt will be independent with HEP in order to improve strength and balance in order to decrease fall risk and improve function at home and work. Baseline: Performing on her own  Goal status: ONGOING      LONG TERM GOALS: Target date: 01/30/2022   Patient will have improved function and activity level as evidenced by an increase in FOTO score by 10 points or more.  Baseline: 39 12/27/21: 60 01/22/22: 60  Goal status: ACHIEVED    2.  Patient will achieve a grip strength that is within standards for age and gender matched norms with 29 lbs for RUE and 28 lbs for LUE to carry out UE related tasks such as dressing and moving  patients with less difficulty. Baseline: Grip Strength R/L 18 kg (40 lbs)/ 14 kg (30 lb) 01/22/22 R/L 60/45  Goal status: ACHIEVED   3. 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 Baseline: Grip Strength R/L 18 kg (40 lbs)/ 14 kg (30 lb) 01/22/22: Grip Strength R/L 60 lb/40 lb  Goal status: Ongoing      PLAN: PT FREQUENCY: 1-2x/week   PT DURATION: 8 weeks   PLANNED INTERVENTIONS: Therapeutic exercises, Therapeutic activity, Gait training, Patient/Family education, Joint manipulation, Joint mobilization, Dry Needling, Electrical stimulation, Spinal manipulation, Spinal mobilization, Cryotherapy, Moist heat, Manual therapy, and Re-evaluation   PLAN FOR NEXT SESSION:   Modify HEP accordingly. Delve further into RUE supinator, work on forearm strengtheners biceps and pronator and supinators with tennis brace. Progress parascapular strengthening exercises    Bradly Chris PT, DPT  01/22/2022, 8:55 AM   e

## 2022-01-24 ENCOUNTER — Ambulatory Visit: Payer: 59 | Admitting: Physical Therapy

## 2022-01-29 LAB — HM DIABETES EYE EXAM

## 2022-01-30 ENCOUNTER — Ambulatory Visit: Payer: 59 | Admitting: Physical Therapy

## 2022-01-30 ENCOUNTER — Encounter: Payer: Self-pay | Admitting: Physical Therapy

## 2022-01-30 DIAGNOSIS — M79631 Pain in right forearm: Secondary | ICD-10-CM | POA: Diagnosis not present

## 2022-01-30 DIAGNOSIS — M79632 Pain in left forearm: Secondary | ICD-10-CM | POA: Diagnosis not present

## 2022-01-30 NOTE — Therapy (Signed)
OUTPATIENT PHYSICAL THERAPY TREATMENT NOTE/ Re-certification    Reporting periods to and from dates: 12/05/21-01/30/22  Patient Name: Tricia Hill MRN: [id number] DOB:06-09-1971, 51 y.o., female Today's Date: 01/30/2022    PCP: Dr. Park Liter REFERRING PROVIDER: Dr. Park Liter   END OF SESSION:   PT End of Session - 01/30/22 1150     Visit Number 8    Number of Visits 16    Date for PT Re-Evaluation 01/30/22    Authorization Type [employer] [facility]    PT Start Time 8185    PT Stop Time 1230    PT Time Calculation (min) 45 min    Activity Tolerance Patient tolerated treatment well    Behavior During Therapy [facility] for tasks assessed/performed             Past Medical History:  Diagnosis Date   Asthma    Diabetes mellitus without complication ([place]) 6314   GERD (gastroesophageal reflux disease)    Heart murmur    child   Hypertension    Shingles    Sleep apnea    USE CPAP   Past Surgical History:  Procedure Laterality Date   BACK SURGERY     CESAREAN SECTION     X 2   CHOLECYSTECTOMY N/A 01/18/2017   Procedure: LAPAROSCOPIC CHOLECYSTECTOMY WITH INTRAOPERATIVE CHOLANGIOGRAM;  Surgeon: Robert Bellow, MD;  Location: [facility] ORS;  Service: General;  Laterality: N/A;   COLONOSCOPY WITH PROPOFOL N/A 10/13/2020   Procedure: COLONOSCOPY WITH PROPOFOL;  Surgeon: Lin Landsman, MD;  Location: [facility] ENDOSCOPY;  Service: Gastroenterology;  Laterality: N/A;  COVID POSITIVE 09/30/2020   DIAGNOSTIC LAPAROSCOPY  2006   EXCISION OF ABDOMINAL WALL ENDOMETRIOMA   SPINAL FUSION  2012   C3 and C4   TOTAL ABDOMINAL HYSTERECTOMY     Partial   Patient Active Problem List   Diagnosis Date Noted   Anxiety 09/05/2021   HTN (hypertension) 04/16/2018   Fatty liver 12/07/2016   Microscopic hematuria 05/26/2015   OSA (obstructive sleep apnea) 01/31/2015   S/P hysterectomy 01/31/2015   Type 2 diabetes mellitus with hyperglycemia ([place]) 01/31/2015   Morbid obesity ([place]) 01/31/2015    Hyperlipidemia associated with type 2 diabetes mellitus ([place]) 01/31/2015    REFERRING DIAG: M54.12 (ICD-10-CM) - Cervical radiculopathy  THERAPY DIAG:  Pain in left forearm  Pain in right forearm  Rationale for Evaluation and Treatment Rehabilitation  PERTINENT HISTORY: Per Dr. Durenda Age note on 10/30/21   ARM PAIN Duration: R arm couple of months, L arm 3 weeks ago Location: middle upper arm down Mechanism of injury: unknown Onset: gradual Severity: moderate to severe  Quality:  aching pain Frequency: intermittent Radiation: into her wrist Aggravating factors: holding and grasping  Alleviating factors: nothing  Status: worse Treatments attempted: rest, ice, heat, APAP, ibuprofen, and aleve  Relief with NSAIDs?:  no Swelling: no Redness: no  Warmth: no Trauma: no Chest pain: no  Shortness of breath: no  Fever: no Decreased sensation: no Paresthesias: no Weakness: yes   Bilateral arm pain    -  Primary                    Will check labs and x-rays. Await results. Treat as needed.              Relevant Orders             Lyme Disease Serology w/Reflex             Babesia microti  Antibody Panel             Rocky mtn spotted fvr abs pnl(IgG+IgM)             Sed Rate (ESR)             Ehrlichia Antibody Panel             Comprehensive metabolic panel             CBC with Differential/Platelet             VITAMIN D 25 Hydroxy (Vit-D Deficiency, Fractures)             DG Cervical Spine Complete             DG Shoulder Left             DG Shoulder Right              Tick bite, unspecified site, initial encounter                 Will check labs. Await results. Treat as needed.              Relevant Orders             Lyme Disease Serology w/Reflex             Babesia microti Antibody Panel             Rocky mtn spotted fvr abs pnl(IgG+IgM)             Ehrlichia Antibody Panel   Lyme rule out   PRECAUTIONS: None   SUBJECTIVE:  Pt reports experiencing an  increase in left biceps pain on Saturday with no clear cause. She also has been experiencing an increase in lateral epicondylitis pain since lifting a patient at work.    PAIN:  Are you having pain? Yes: NPRS scale: 2-3/10 Pain location: Left lateral epicondyle  Pain description: achy and shooting  Aggravating factors: Lifting and pulling  Relieving factors: Nothing really helps   OBJECTIVE:    DIAGNOSTIC FINDINGS:                EXAM: CERVICAL SPINE - COMPLETE 4+ VIEW   COMPARISON:  Intraoperative cervical spine radiographs 09/08/2010   FINDINGS: Postsurgical changes are seen of C3 through C6 ACDF with associated intervertebral disc spacers. No periprosthetic lucency is seen to indicate hardware failure or loosening.   Moderate C2-3 and C6-7 disc space narrowing. Moderate anterior C2-3 endplate osteophytosis.   The atlantodens interval is intact.   On oblique views there appears to be bilateral C3-4 osseous neuroforaminal narrowing. 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   The lateral masses of C1 are symmetrically aligned with the dens on the open-mouth odontoid view.   No prevertebral soft tissue swelling.  The lung apices are clear.   IMPRESSION: 1. Status post C3 through C6 ACDF. 2. Moderate C2-3 and C6-7 degenerative disc and endplate changes. 3. 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               ////////////////////////////////////////////////////////////////////////////////   CLINICAL DATA:  Bilateral shoulder and arm pain.   EXAM: RIGHT SHOULDER - 2+ VIEW   COMPARISON:  None Available.   FINDINGS: Mild acromioclavicular joint space narrowing and peripheral osteophytosis. No significant osteoarthritis of the right glenohumeral joint. No acute fracture is seen. No dislocation. The visualized portion of the right lung is unremarkable. Partial visualization of ACDF hardware.   IMPRESSION: Mild right  acromioclavicular  osteoarthritis.     Electronically Signed   By: Yvonne Kendall M.D.   On: 10/31/2021 12:55   /////////////////////////////////////////////////////////////////////////   CLINICAL DATA:  Bilateral shoulder and arm pain.   EXAM: LEFT SHOULDER - 2+ VIEW   COMPARISON:  None Available.   FINDINGS: Mild acromioclavicular joint space narrowing and peripheral osteophytosis. No acute fracture or dislocation. Partial visualization of ACDF hardware. The visualized portion of the left lung is unremarkable.   IMPRESSION: Mild left acromioclavicular osteoarthritis.            /////////////////////////////////////////////////////////////////////            Lyme rule out    Lyme Total Antibody EIA    Negative        Negative  Comment: Lyme antibodies not detected. Reflex testing is not indicated.  No laboratory evidence of infection with B. burgdorferi (Lyme disease).  Negative results may occur in patients recently infected (less than  or equal to 14 days) with B. burgdorferi.  If recent infection is  suspected, repeat testing on a new sample collected in 7 to 14 days is  recommended.    CLINICAL DATA:  History of ACDF. Right-greater-than-left arm pain and numbness.       PATIENT SURVEYS:  FOTO 39/60     COGNITION: Overall cognitive status: Within functional limits for tasks assessed     SENSATION: WFL   POSTURE: No Significant postural limitations   PALPATION: Forearm flexors on right forearm and lateral epicondylitis on left forearm            CERVICAL ROM:    Active ROM A/PROM (deg) eval  Flexion 45  Extension 45  Right lateral flexion 45  Left lateral flexion 45  Right rotation 60  Left rotation 60   (Blank rows = not tested)   UPPER EXTREMITY ROM:        Active ROM Right eval Left eval  Shoulder flexion 180 180  Shoulder extension 60 60  Shoulder abduction 180  180  Shoulder adduction      Shoulder internal rotation 70 70  Shoulder external  rotation 90 90  Elbow flexion 150 150  Elbow extension 60* 60*  Wrist flexion 80 80  Wrist extension 70 70  Wrist ulnar deviation 30 30  Wrist radial deviation 20 20  Wrist pronation      Wrist supination      (Blank rows = not tested)            UPPER EXTREMITY MMT:   MMT Right eval Left eval  Shoulder flexion 5 5  Shoulder extension      Shoulder abduction 5 5  Shoulder adduction      Shoulder extension      Shoulder internal rotation      Shoulder external rotation      Middle trapezius      Lower trapezius      Elbow flexion 5* 5*  Elbow extension 5* 5*  Wrist flexion 5 5  Wrist extension 5 5*  Wrist ulnar deviation 5 5  Wrist radial deviation 5 5  Wrist pronation 5* 5  Wrist supination 5 5  Grip strength       (Blank rows = not tested)   CERVICAL SPECIAL TESTS:    Cervical Radiculopathy Test cluster:   -rotation AROM <60 deg towards symptomatic side?: Negative bilateral    -Spurling's test: NT   -response to manual traction: NT   -upper limb tension test A: NT  FUNCTIONAL TESTS: None tested    Grip Strength ([place]owetz 1984):   [email or web address]     TODAY'S TREATMENT:   01/30/22  THEREX  UBE with 2 resistance- 5 min Eccentric wrist flexion with #3 DB on LUE 2 x 10 Supination and Pronation on LUE with #3 DB 2 x 10  MANUAL   Trigger point release and massage of lateral epicondylitis of inserting tendons    01/22/22  UBE with 2 resistance- 9 min   FOTO: 60/60 Eccentric wrist flexion with #2 DB on LUE 3 x 10  Review of tennis elbow education videos via Med[employer]  Grip Strength R/L 60 lbs/40 lbs   12/23/21 UBE with 3 resistance with seat and arms at 8 for 6 min  Left Wrist Flexor Stretch 2 x 30 sec  FOTO: 60 Seated Rows Black TB 3 x 10  Seated ER at 0 deg abduction with yellow TB 3 x 10   12/21/21 UBE Level 1 Resistance Seat and Arms at 11  for 5 min  Grip Strength with Jamar R/L 18 kg (40 lbs)/ 14 kg (30 lb) Seated Rows with Green TB 1 x 10  Seated Rows with Blue TB  2 x 10  Eccentric Wrist Extension 1 lb 3 x 10  -min VC for 1 sec concentric and 3 sec eccentric  Prontation and Supination on LUE # 2 LB    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 Using pronated LUE and supinated RUE to avoid overstretching tendons on LUE.   Recommended wearing tennis elbow brace at work to continue to offset load on extensor tendons   12/18/21 UBE Level 1 Resistance Seat 7 for 6 min   *All exercises performed with pt wearing tennis brace on left forearm   Stress Ball Squeeze on LUE 3 x 10   Pronation and Supination with #1 DB on LUE 1 x 10  -NPS 6/10   Inspected fitting of brace to make sure there was proper fitting referencing following video:  [email or web address]  Wrist Extension Eccentric with # 1 DB 1 x 10    12/13/21 UBE Level 1 Resistance Seat Level 7 for 6 min   Left Wrist Ext Isometrics 3 sec holds x 5             -NPS increases from 5/10 to 8/10   Left Wrist Pronation/Supination #1 DB 1 x 10 -NPS from 5/10 to 6/10   Discussed use of tennis elbow brace as option given ongoing elbow pain localized to left lateral epicondylitis.   Use of BP cuff as makeshift tennis elbow brace for left elbow   Left Wrist Extension with #1 DB 1 x 10 with BP cuff  -NPS from 5/10 to 6/10   Left Wrist Pronation/Supination #1 DB 1 x 10 -NPS from 6/10 to 6/10              LUE Ball Squeeze 1 x 10    PATIENT EDUCATION:  Education details: form and technique for appropriate exercise. Explanation of differentials  Person educated: Patient Education method: Explanation, Demonstration, Verbal cues, and Handouts Education comprehension: verbalized understanding, returned demonstration, and verbal cues required     HOME EXERCISE PROGRAM: Access Code: [email or web address] URL:  [email or web address] Date: 01/30/2022 Prepared by: Bradly Chris  Exercises - Seated Eccentric Wrist Extension  - 1 x daily - 3 x weekly - 3 sets - 10 reps - Seated Pronation Supination with Dumbbell  - 1 x daily - 3 x weekly - 3  sets - 10 reps - Seated Shoulder Row with Anchored Resistance  - 1 x daily - 3 x weekly - 3 sets - 10 reps - Quick Finger Spreading with Rubber Band  - 1 x daily - 3 x weekly - 3 sets - 10 reps - Putty Squeezes  - 1 x daily - 3 x weekly - 3 sets - 10 reps - Seated Wrist Flexion Stretch  - 1 x daily - 7 x weekly - 1 sets - 3 reps - 30 hold - Shoulder External Rotation and Scapular Retraction with Resistance  - 1 x daily - 3 x weekly - 3 sets - 10 reps  Patient Education - Tennis Elbow - What Is Lateral Elbow Pain?   ASSESSMENT:   CLINICAL IMPRESSION:  Pt exhibits a decrease in grip strength that is likely caused by a recent increase in left lateral epicondylitis pain from lifting patient at work. Progressed forearm strengthening exercises with no increase in her pain. Instructed pt to not exercise into pain. 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  ACTIVITY LIMITATIONS carrying, lifting, reach over head, and caring for others   PARTICIPATION LIMITATIONS: occupation and [place] [employer], Fitness, Profession, and 1-2 comorbidities: Obesity and T2DM  are also affecting patient's functional outcome.    REHAB POTENTIAL: Good   CLINICAL DECISION MAKING: Stable/uncomplicated   EVALUATION COMPLEXITY: Low     GOALS: Goals reviewed with patient? No   SHORT TERM GOALS: Target date: 12/19/2021    Pt will be independent with HEP in order to improve strength and balance in order to decrease fall risk and improve function at home and work. Baseline: Performing on her own  Goal status: ONGOING       LONG TERM GOALS: Target date: 01/30/2022   Patient will have improved function and activity level as evidenced by an increase in FOTO score by 10 points or more.  Baseline: 39 12/27/21: 60 01/22/22: 60  Goal status: ACHIEVED    2.  Patient will achieve a grip strength that is within standards for age and gender matched norms with 29 lbs for RUE and 28 lbs for LUE to carry out UE related tasks such as dressing and moving patients with less difficulty. Baseline: Grip Strength R/L 18 kg (40 lbs)/ 14 kg (30 lb) 01/22/22 R/L 60/45  Goal status: ACHIEVED   3. 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 Baseline: Grip Strength R/L 18 kg (40 lbs)/ 14 kg (30 lb) 01/22/22: Grip Strength R/L 60 lb/40 lb 01/30/22: 55 lb /25 lb  Goal status: Ongoing      PLAN: PT FREQUENCY: 1-2x/week   PT DURATION: 8 weeks   PLANNED INTERVENTIONS: Therapeutic exercises, Therapeutic activity, Gait training, Patient/Family education, Joint manipulation, Joint mobilization, Dry Needling, Electrical stimulation, Spinal manipulation, Spinal mobilization, Cryotherapy, Moist heat, Manual therapy, and Re-evaluation   PLAN FOR NEXT SESSION:   Modify HEP accordingly. Delve further into RUE supinator, work on forearm strengtheners biceps and pronator and supinators with tennis brace. Progress parascapular strengthening exercises    Bradly Chris PT, DPT  01/30/2022, 11:51 AM   e

## 2022-02-01 ENCOUNTER — Ambulatory Visit: Payer: 59 | Admitting: Physical Therapy

## 2022-02-03 ENCOUNTER — Other Ambulatory Visit: Payer: Self-pay

## 2022-02-03 ENCOUNTER — Encounter: Payer: Self-pay | Admitting: Emergency Medicine

## 2022-02-03 ENCOUNTER — Emergency Department
Admission: EM | Admit: 2022-02-03 | Discharge: 2022-02-04 | Disposition: A | Discharge status: Home / Self Care | Payer: 59 | Attending: Emergency Medicine | Admitting: Emergency Medicine

## 2022-02-03 DIAGNOSIS — M546 Pain in thoracic spine: Secondary | ICD-10-CM | POA: Insufficient documentation

## 2022-02-03 DIAGNOSIS — S29012A Strain of muscle and tendon of back wall of thorax, initial encounter: Secondary | ICD-10-CM

## 2022-02-03 DIAGNOSIS — I10 Essential (primary) hypertension: Secondary | ICD-10-CM | POA: Diagnosis not present

## 2022-02-03 DIAGNOSIS — M545 Low back pain, unspecified: Secondary | ICD-10-CM | POA: Diagnosis not present

## 2022-02-03 DIAGNOSIS — E119 Type 2 diabetes mellitus without complications: Secondary | ICD-10-CM | POA: Diagnosis not present

## 2022-02-03 LAB — COMPREHENSIVE METABOLIC PANEL
ALT: 27 U/L (ref 0–44)
AST: 26 U/L (ref 15–41)
Albumin: 3.6 g/dL (ref 3.5–5.0)
Alkaline Phosphatase: 84 U/L (ref 38–126)
Anion gap: 10 (ref 5–15)
BUN: 10 mg/dL (ref 6–20)
CO2: 25 mmol/L (ref 22–32)
Calcium: 8.6 mg/dL — ABNORMAL LOW (ref 8.9–10.3)
Chloride: 108 mmol/L (ref 98–111)
Creatinine, Ser: 0.68 mg/dL (ref 0.44–1.00)
GFR, Estimated: >60 mL/min (ref >60)
Glucose, Bld: 195 mg/dL — ABNORMAL HIGH (ref 70–99)
Potassium: 3.7 mmol/L (ref 3.5–5.1)
Sodium: 143 mmol/L (ref 135–145)
Total Bilirubin: 0.7 mg/dL (ref 0.3–1.2)
Total Protein: 6.9 g/dL (ref 6.5–8.1)

## 2022-02-03 LAB — CBC
HCT: 36.9 % (ref 36.0–46.0)
Hemoglobin: 12 g/dL (ref 12.0–15.0)
MCH: 28 pg (ref 26.0–34.0)
MCHC: 32.5 g/dL (ref 30.0–36.0)
MCV: 86 fL (ref 80.0–100.0)
Platelets: 251 10*3/uL (ref 150–400)
RBC: 4.29 MIL/uL (ref 3.87–5.11)
RDW: 13.1 % (ref 11.5–15.5)
WBC: 9.3 10*3/uL (ref 4.0–10.5)
nRBC: 0 % (ref 0.0–0.2)

## 2022-02-03 LAB — URINALYSIS, ROUTINE W REFLEX MICROSCOPIC
Bilirubin Urine: NEGATIVE
Glucose, UA: NEGATIVE mg/dL
Hgb urine dipstick: NEGATIVE
Ketones, ur: NEGATIVE mg/dL
Leukocytes,Ua: NEGATIVE
Nitrite: NEGATIVE
Protein, ur: NEGATIVE mg/dL
Specific Gravity, Urine: 1.023 (ref 1.005–1.030)
pH: 6 (ref 5.0–8.0)

## 2022-02-03 MED ORDER — KETOROLAC TROMETHAMINE 15 MG/ML IJ SOLN
15.0000 mg | Freq: Once | INTRAMUSCULAR | Status: AC
  Administered 2022-02-03: 15 mg via INTRAVENOUS
  Filled 2022-02-03: qty 1

## 2022-02-03 MED ORDER — ONDANSETRON HCL 4 MG/2ML IJ SOLN
4.0000 mg | Freq: Once | INTRAMUSCULAR | Status: AC
  Administered 2022-02-03: 4 mg via INTRAVENOUS
  Filled 2022-02-03: qty 2

## 2022-02-03 MED ORDER — ONDANSETRON 4 MG PO TBDP
4.0000 mg | ORAL_TABLET | Freq: Once | ORAL | Status: DC
Start: 2022-02-03 — End: 2022-02-03

## 2022-02-03 MED ORDER — HYDROMORPHONE HCL 1 MG/ML IJ SOLN
0.5000 mg | Freq: Once | INTRAMUSCULAR | Status: AC
  Administered 2022-02-03: 0.5 mg via INTRAVENOUS
  Filled 2022-02-03: qty 0.5

## 2022-02-03 NOTE — ED Triage Notes (Signed)
Pt reports back pain since yesterday denies any injuries, pt reports when she bent over to put pants on had shooting pain across lower back. Pt reports she had a cervical spinal fusion.

## 2022-02-04 ENCOUNTER — Emergency Department: Payer: 59

## 2022-02-04 DIAGNOSIS — M546 Pain in thoracic spine: Secondary | ICD-10-CM | POA: Diagnosis not present

## 2022-02-04 DIAGNOSIS — E119 Type 2 diabetes mellitus without complications: Secondary | ICD-10-CM | POA: Diagnosis not present

## 2022-02-04 DIAGNOSIS — I10 Essential (primary) hypertension: Secondary | ICD-10-CM | POA: Diagnosis not present

## 2022-02-04 DIAGNOSIS — M545 Low back pain, unspecified: Secondary | ICD-10-CM | POA: Diagnosis not present

## 2022-02-04 MED ORDER — METHOCARBAMOL 500 MG PO TABS: 500.0000 mg | ORAL_TABLET | Freq: Four times a day (QID) | ORAL | 0 refills | Status: DC

## 2022-02-04 MED ORDER — LIDOCAINE 5 % EX PTCH: 1.0000 | MEDICATED_PATCH | Freq: Two times a day (BID) | CUTANEOUS | 1 refills | Status: DC

## 2022-02-04 MED ORDER — KETOROLAC TROMETHAMINE 30 MG/ML IJ SOLN
15.0000 mg | Freq: Once | INTRAMUSCULAR | Status: AC
Start: 2022-02-04 — End: 2022-02-04
  Administered 2022-02-04: 15 mg via INTRAVENOUS
  Filled 2022-02-04: qty 1

## 2022-02-04 MED ORDER — ACETAMINOPHEN 500 MG PO TABS
1000.0000 mg | ORAL_TABLET | Freq: Once | ORAL | Status: AC
Start: 2022-02-04 — End: 2022-02-04
  Administered 2022-02-04: 1000 mg via ORAL
  Filled 2022-02-04: qty 2

## 2022-02-04 MED ORDER — METHOCARBAMOL 500 MG PO TABS
500.0000 mg | ORAL_TABLET | Freq: Once | ORAL | Status: AC
Start: 2022-02-04 — End: 2022-02-04
  Administered 2022-02-04: 500 mg via ORAL
  Filled 2022-02-04: qty 1

## 2022-02-04 MED ORDER — LIDOCAINE 5 % EX PTCH
1.0000 | MEDICATED_PATCH | CUTANEOUS | Status: DC
  Administered 2022-02-04: 1 via TRANSDERMAL
  Filled 2022-02-04: qty 1

## 2022-02-04 NOTE — ED Provider Notes (Signed)
[facility] Provider Note    Event Date/Time   First MD Initiated Contact with Patient 02/03/22 2358     (approximate)   History   Back Pain   HPI  Tricia Hill is a 51 y.o. female who presents to the ED for evaluation of Back Pain   I reviewed PCP visit from 6/29.  Morbidly obese patient with history of HTN, DM, HLD.   She presents to the ED for evaluation of right-sided thoracic back pain over the past 2 days.  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  Reports some improvement with heating pad.  No improvement with Flexeril.  Denies fever, trauma or injuries, saddle anesthesias, urinary or stool retention.   Physical Exam   Triage Vital Signs: ED Triage Vitals  Enc Vitals Group     BP 02/03/22 2109 (!) 162/126     Pulse Rate 02/03/22 2109 79     Resp 02/03/22 2109 20     Temp 02/03/22 2109 98.5 F (36.9 C)     Temp Source 02/03/22 2109 Oral     SpO2 02/03/22 2109 99 %     Weight 02/03/22 2115 238 lb (108 kg)     Height 02/03/22 2115 '4\' 11"'$  (1.499 m)     Head Circumference --      Peak Flow --      Pain Score 02/03/22 2115 10     Pain Loc --      Pain Edu? --      Excl. in [place]? --     Most recent vital signs: Vitals:   02/03/22 2109  BP: (!) 162/126  Pulse: 79  Resp: 20  Temp: 98.5 F (36.9 C)  SpO2: 99%    General: Awake, no distress.  CV:  Good peripheral perfusion.  Resp:  Normal effort.  Abd:  No distention.  MSK:  No deformity noted.  Lower thoracic right-sided paraspinal tenderness to palpation without overlying skin changes or signs of trauma.  No rash or signs of herpes zoster. Neuro:  No focal deficits appreciated. Cranial nerves II through XII intact 5/5 strength and sensation in all 4 extremities Other:     ED Results / Procedures / Treatments   Labs (all labs ordered are listed, but only abnormal  results are displayed) Labs Reviewed  COMPREHENSIVE METABOLIC PANEL - Abnormal; Notable for the following components:      Result Value   Glucose, Bld 195 (*)    Calcium 8.6 (*)    All other components within normal limits  URINALYSIS, ROUTINE W REFLEX MICROSCOPIC - Abnormal; Notable for the following components:   Color, Urine YELLOW (*)    APPearance HAZY (*)    All other components within normal limits  CBC    EKG   RADIOLOGY Plain film of the thoracic spine interpreted by me without evidence of fracture or dislocation. Plain film of the lumbar spine interpreted by me without evidence of fracture or dislocation.  Official radiology report(s): No results found.  PROCEDURES and INTERVENTIONS:  Procedures  Medications  ketorolac (TORADOL) 15 MG/ML injection 15 mg (15 mg Intravenous Given 02/03/22 2124)  HYDROmorphone (DILAUDID) injection 0.5 mg (0.5 mg Intravenous Given 02/03/22 2309)  ondansetron (ZOFRAN) injection 4 mg (4 mg Intravenous Given 02/03/22 2309)     IMPRESSION / MDM / ASSESSMENT AND PLAN / ED COURSE  I reviewed the triage vital signs and the nursing notes.  Differential diagnosis includes, but is not limited to, muscular strain or spasm, fracture, cauda equina, radiculopathy  {Patient presents with symptoms of an acute illness or injury that is potentially life-threatening.  51 year old female presents to the ED with atraumatic back pain without red flag features and suitable for outpatient management with nonnarcotic multimodal analgesia.  Reassuring exam without signs of neurologic or vascular deficits.  Doubt cord compression.  Urinalysis, and CBC are normal.  Mild hyperglycemia is noted, but no stigmata of acidosis to suggest DKA.  Plain films are reassuring.  Notifications for more advanced imaging at this point.  We discussed rehab exercises and analgesic regimen.  Clinical Course as of 02/04/22 0134  Sun Feb 04, 2022  0123 Reassessed.  Patient reports  feeling better.  Discussed x-ray results.  We discussed nonnarcotic multimodal analgesia at home as well as return precautions for the ED.  Answered questions. [DS]    Clinical Course User Index [DS] Vladimir Crofts, MD     FINAL CLINICAL IMPRESSION(S) / ED DIAGNOSES   Final diagnoses:  None     Rx / DC Orders   ED Discharge Orders     None        Note:  This document was prepared using Dragon voice recognition software and may include unintentional dictation errors.   Vladimir Crofts, MD 02/04/22 [phone]

## 2022-02-04 NOTE — Discharge Instructions (Addendum)
Please take Tylenol and ibuprofen/Advil for your pain.  It is safe to take them together, or to alternate them every few hours.  Take up to '1000mg'$  of Tylenol at a time, up to 4 times per day.  Do not take more than 4000 mg of Tylenol in 24 hours.  For ibuprofen, take 400-600 mg, 3 - 4 times per day.  Please use lidocaine patches at your site of pain.  Apply 1 patch at a time, leave on for 12 hours, then remove for 12 hours.  12 hours on, 12 hours off.  Do not apply more than 1 patch at a time.  Use the methocarbamol/Robaxin muscle relaxer 3-4 times per day as needed for more severe pain.  Do not take Flexeril while taking this medication.  This muscle relaxer may make you little bit sleepy.  Do not drive or operate machinery.

## 2022-02-04 NOTE — ED Notes (Signed)
Patient transported to X-ray 

## 2022-02-05 ENCOUNTER — Encounter: Payer: 59 | Admitting: Physical Therapy

## 2022-02-06 ENCOUNTER — Encounter: Payer: Self-pay | Admitting: Family Medicine

## 2022-02-06 DIAGNOSIS — S29019A Strain of muscle and tendon of unspecified wall of thorax, initial encounter: Secondary | ICD-10-CM | POA: Diagnosis not present

## 2022-02-07 ENCOUNTER — Ambulatory Visit: Payer: 59 | Admitting: Physical Therapy

## 2022-02-07 ENCOUNTER — Other Ambulatory Visit: Payer: Self-pay

## 2022-02-07 MED ORDER — METHYLPREDNISOLONE 4 MG PO TBPK
ORAL_TABLET | ORAL | 0 refills | Status: DC
  Filled 2022-02-07: qty 21, 6d supply, fill #0

## 2022-02-07 MED ORDER — CYCLOBENZAPRINE HCL 5 MG PO TABS
ORAL_TABLET | ORAL | 0 refills | Status: DC
  Filled 2022-02-07: qty 15, 5d supply, fill #0

## 2022-02-07 NOTE — Telephone Encounter (Signed)
Pt scheduled tomorrow

## 2022-02-07 NOTE — Telephone Encounter (Signed)
Appt so we can make sure it's covered. Virtual OK

## 2022-02-08 ENCOUNTER — Encounter: Payer: Self-pay | Admitting: Family Medicine

## 2022-02-08 ENCOUNTER — Other Ambulatory Visit: Payer: Self-pay

## 2022-02-08 ENCOUNTER — Telehealth: Payer: 59 | Admitting: Family Medicine

## 2022-02-08 ENCOUNTER — Ambulatory Visit
Admission: RE | Admit: 2022-02-08 | Discharge: 2022-02-08 | Disposition: A | Discharge status: Home / Self Care | Payer: 59 | Source: Ambulatory Visit | Attending: Family Medicine | Admitting: Family Medicine

## 2022-02-08 ENCOUNTER — Other Ambulatory Visit: Payer: Self-pay | Admitting: Family Medicine

## 2022-02-08 DIAGNOSIS — M5134 Other intervertebral disc degeneration, thoracic region: Secondary | ICD-10-CM | POA: Diagnosis not present

## 2022-02-08 DIAGNOSIS — M4802 Spinal stenosis, cervical region: Secondary | ICD-10-CM | POA: Diagnosis not present

## 2022-02-08 DIAGNOSIS — R200 Anesthesia of skin: Secondary | ICD-10-CM | POA: Diagnosis not present

## 2022-02-08 DIAGNOSIS — M4724 Other spondylosis with radiculopathy, thoracic region: Secondary | ICD-10-CM | POA: Diagnosis not present

## 2022-02-08 DIAGNOSIS — M4722 Other spondylosis with radiculopathy, cervical region: Secondary | ICD-10-CM

## 2022-02-08 MED ORDER — HYDROCODONE-ACETAMINOPHEN 10-325 MG PO TABS
1.0000 | ORAL_TABLET | Freq: Four times a day (QID) | ORAL | 0 refills | Status: AC | PRN
  Filled 2022-02-08: qty 20, 5d supply, fill #0

## 2022-02-08 NOTE — Assessment & Plan Note (Signed)
Chronic. Has done PT and it has been getting worse. Now in severe pain. Will continue steroids and muscle relaxer. Pain medicine given for now. Continue heat. Await MRI. May need neurosurgery referral.

## 2022-02-08 NOTE — Progress Notes (Signed)
LMP  (LMP Unknown)    Subjective:    Patient ID: Tricia Hill, female    DOB: 1971/05/23, 51 y.o.   MRN: [id number]  HPI: Tricia Hill is a 51 y.o. female  Chief Complaint  Patient presents with   Back Pain    Patient states her back has been hurting since last Friday morning. No known injury. Back pain is on the right mid back, has tried OTC patches no relief. Was seen in ED and given Robaxin, did not help. Was seen at [facility] and given prednisone taper yesterday.    BACK PAIN Duration: chronic significantly worse in the past 5-6 days Mechanism of injury: unknown Location: Right and upper back Onset: sudden Severity: severe Quality: sharp, shooting Frequency: constant Radiation: R arm Aggravating factors: moving Alleviating factors: rest, ice, heat, laying, NSAIDs, and APAP Status: worse Treatments attempted: rest, ice, heat, APAP, ibuprofen, aleve, physical therapy, and HEP  Relief with NSAIDs?: no Nighttime pain:  yes Paresthesias / decreased sensation:  yes Bowel / bladder incontinence:  no Fevers:  no Dysuria / urinary frequency:  no   Relevant past medical, surgical, family and social history reviewed and updated as indicated. Interim medical history since our last visit reviewed. Allergies and medications reviewed and updated.  Review of Systems  Constitutional: Negative.   Respiratory: Negative.    Cardiovascular: Negative.   Gastrointestinal: Negative.   Genitourinary: Negative.   Musculoskeletal:  Positive for back pain, myalgias, neck pain and neck stiffness. Negative for arthralgias, gait problem and joint swelling.  Skin: Negative.   Neurological:  Positive for weakness and numbness. Negative for dizziness, tremors, seizures, syncope, facial asymmetry, speech difficulty, light-headedness and headaches.  Psychiatric/Behavioral: Negative.      Per HPI unless specifically indicated above     Objective:    LMP  (LMP Unknown)   Wt  Readings from Last 3 Encounters:  02/03/22 238 lb (108 kg)  12/07/21 238 lb 6.4 oz (108.1 kg)  10/30/21 242 lb 3.2 oz (109.9 kg)    Physical Exam Vitals and nursing note reviewed.  Constitutional:      General: She is not in acute distress.    Appearance: Normal appearance. She is obese. She is not ill-appearing, toxic-appearing or diaphoretic.  HENT:     Head: Normocephalic and atraumatic.     Right Ear: External ear normal.     Left Ear: External ear normal.     Nose: Nose normal.     Mouth/Throat:     Mouth: Mucous membranes are moist.     Pharynx: Oropharynx is clear.  Eyes:     General: No scleral icterus.       Right eye: No discharge.        Left eye: No discharge.     Conjunctiva/sclera: Conjunctivae normal.     Pupils: Pupils are equal, round, and reactive to light.  Pulmonary:     Effort: Pulmonary effort is normal. No respiratory distress.     Comments: Speaking in full sentences Musculoskeletal:     Cervical back: Normal range of motion.  Skin:    Coloration: Skin is not jaundiced or pale.     Findings: No bruising, erythema, lesion or rash.  Neurological:     Mental Status: She is alert and oriented to person, place, and time. Mental status is at baseline.  Psychiatric:        Mood and Affect: Mood normal.        Behavior: Behavior  normal.        Thought Content: Thought content normal.        Judgment: Judgment normal.     Results for orders placed or performed during the hospital encounter of 02/03/22  CBC  Result Value Ref Range   WBC 9.3 4.0 - 10.5 K/uL   RBC 4.29 3.87 - 5.11 MIL/uL   Hemoglobin 12.0 12.0 - 15.0 g/dL   HCT 36.9 36.0 - 46.0 %   MCV 86.0 80.0 - 100.0 fL   MCH 28.0 26.0 - 34.0 pg   MCHC 32.5 30.0 - 36.0 g/dL   RDW 13.1 11.5 - 15.5 %   Platelets 251 150 - 400 K/uL   nRBC 0.0 0.0 - 0.2 %  Comprehensive metabolic panel  Result Value Ref Range   Sodium 143 135 - 145 mmol/L   Potassium 3.7 3.5 - 5.1 mmol/L   Chloride 108 98 - 111  mmol/L   CO2 25 22 - 32 mmol/L   Glucose, Bld 195 (H) 70 - 99 mg/dL   BUN 10 6 - 20 mg/dL   Creatinine, Ser 0.68 0.44 - 1.00 mg/dL   Calcium 8.6 (L) 8.9 - 10.3 mg/dL   Total Protein 6.9 6.5 - 8.1 g/dL   Albumin 3.6 3.5 - 5.0 g/dL   AST 26 15 - 41 U/L   ALT 27 0 - 44 U/L   Alkaline Phosphatase 84 38 - 126 U/L   Total Bilirubin 0.7 0.3 - 1.2 mg/dL   GFR, Estimated >60 >60 mL/min   Anion gap 10 5 - 15  Urinalysis, Routine w reflex microscopic  Result Value Ref Range   Color, Urine YELLOW (A) YELLOW   APPearance HAZY (A) CLEAR   Specific Gravity, Urine 1.023 1.005 - 1.030   pH 6.0 5.0 - 8.0   Glucose, UA NEGATIVE NEGATIVE mg/dL   Hgb urine dipstick NEGATIVE NEGATIVE   Bilirubin Urine NEGATIVE NEGATIVE   Ketones, ur NEGATIVE NEGATIVE mg/dL   Protein, ur NEGATIVE NEGATIVE mg/dL   Nitrite NEGATIVE NEGATIVE   Leukocytes,Ua NEGATIVE NEGATIVE      Assessment & Plan:   Problem List Items Addressed This Visit       Nervous and Auditory   Osteoarthritis of spine with radiculopathy, cervical region    Chronic. Has done PT and it has been getting worse. Now in severe pain. Will continue steroids and muscle relaxer. Pain medicine given for now. Continue heat. Await MRI. May need neurosurgery referral.      Relevant Medications   HYDROcodone-acetaminophen (NORCO) 10-325 MG tablet   Other Relevant Orders   MR Cervical Spine Wo Contrast   Osteoarthritis of spine with radiculopathy, thoracic region - Primary    Chronic. Has done PT and it has been getting worse. Now in severe pain. Will continue steroids and muscle relaxer. Pain medicine given for now. Continue heat. Await MRI. May need neurosurgery referral.      Relevant Medications   HYDROcodone-acetaminophen (NORCO) 10-325 MG tablet   Other Relevant Orders   MR Thoracic Spine Wo Contrast   Other Visit Diagnoses     Other intervertebral disc degeneration, thoracic region       Relevant Medications   HYDROcodone-acetaminophen  (NORCO) 10-325 MG tablet   Other Relevant Orders   MR Thoracic Spine Wo Contrast        Follow up plan: Return if symptoms worsen or fail to improve.    This visit was completed via video visit through MyChart due to the restrictions  of the COVID-19 pandemic. All issues as above were discussed and addressed. Physical exam was done as above through visual confirmation on video through MyChart. If it was felt that the patient should be evaluated in the office, they were directed there. The patient verbally consented to this visit. Location of the patient: home Location of the provider: work Those involved with this call:  Provider: Park Liter, DO CMA: Louanna Raw, [place] Desk/Registration: [employer]  Time spent on call:  15 minutes with patient face to face via video conference. More than 50% of this time was spent in counseling and coordination of care. 23 minutes total spent in review of patient's record and preparation of their chart.

## 2022-02-09 ENCOUNTER — Other Ambulatory Visit: Payer: Self-pay

## 2022-02-09 ENCOUNTER — Encounter: Payer: Self-pay | Admitting: Family Medicine

## 2022-02-09 MED ORDER — PREDNISONE 10 MG PO TABS
ORAL_TABLET | ORAL | 0 refills | Status: DC
  Filled 2022-02-09: qty 42, 12d supply, fill #0

## 2022-02-13 ENCOUNTER — Telehealth: Payer: Self-pay | Admitting: Physical Therapy

## 2022-02-13 ENCOUNTER — Ambulatory Visit: Payer: 59 | Admitting: Physical Therapy

## 2022-02-13 NOTE — Telephone Encounter (Signed)
Called pt to check whether her back pain had subsided. She did not pick up so left VM instructing pt to call back to check in about upcoming apt.

## 2022-02-14 ENCOUNTER — Ambulatory Visit
Admission: RE | Admit: 2022-02-14 | Discharge: 2022-02-14 | Disposition: A | Discharge status: Home / Self Care | Payer: 59 | Source: Ambulatory Visit | Attending: Family Medicine | Admitting: Family Medicine

## 2022-02-14 DIAGNOSIS — Z1231 Encounter for screening mammogram for malignant neoplasm of breast: Secondary | ICD-10-CM | POA: Diagnosis not present

## 2022-02-15 ENCOUNTER — Encounter: Payer: 59 | Admitting: Physical Therapy

## 2022-02-16 ENCOUNTER — Encounter: Payer: Self-pay | Admitting: Family Medicine

## 2022-02-18 ENCOUNTER — Encounter: Payer: Self-pay | Admitting: Family Medicine

## 2022-02-20 ENCOUNTER — Encounter: Payer: 59 | Admitting: Physical Therapy

## 2022-02-21 DIAGNOSIS — M4802 Spinal stenosis, cervical region: Secondary | ICD-10-CM | POA: Diagnosis not present

## 2022-02-21 DIAGNOSIS — Z6841 Body Mass Index (BMI) 40.0 and over, adult: Secondary | ICD-10-CM | POA: Diagnosis not present

## 2022-02-22 ENCOUNTER — Encounter: Payer: 59 | Admitting: Physical Therapy

## 2022-02-23 ENCOUNTER — Encounter: Payer: Self-pay | Admitting: Family Medicine

## 2022-02-27 ENCOUNTER — Ambulatory Visit: Payer: 59 | Admitting: Physical Therapy

## 2022-03-01 ENCOUNTER — Encounter: Payer: 59 | Admitting: Physical Therapy

## 2022-03-01 DIAGNOSIS — M4802 Spinal stenosis, cervical region: Secondary | ICD-10-CM | POA: Diagnosis not present

## 2022-03-01 DIAGNOSIS — M5412 Radiculopathy, cervical region: Secondary | ICD-10-CM | POA: Diagnosis not present

## 2022-03-06 ENCOUNTER — Ambulatory Visit: Payer: 59 | Admitting: Physical Therapy

## 2022-03-08 ENCOUNTER — Encounter: Payer: 59 | Admitting: Physical Therapy

## 2022-03-09 ENCOUNTER — Encounter: Payer: Self-pay | Admitting: Family Medicine

## 2022-03-09 ENCOUNTER — Other Ambulatory Visit: Payer: Self-pay

## 2022-03-09 ENCOUNTER — Ambulatory Visit: Payer: Self-pay

## 2022-03-09 ENCOUNTER — Ambulatory Visit: Payer: 59 | Admitting: Family Medicine

## 2022-03-09 VITALS — BP 120/88 | HR 102 | Temp 97.8°F | Ht 59.02 in | Wt 225.9 lb

## 2022-03-09 DIAGNOSIS — R110 Nausea: Secondary | ICD-10-CM

## 2022-03-09 DIAGNOSIS — M4722 Other spondylosis with radiculopathy, cervical region: Secondary | ICD-10-CM | POA: Diagnosis not present

## 2022-03-09 DIAGNOSIS — M4724 Other spondylosis with radiculopathy, thoracic region: Secondary | ICD-10-CM

## 2022-03-09 DIAGNOSIS — Z23 Encounter for immunization: Secondary | ICD-10-CM | POA: Diagnosis not present

## 2022-03-09 DIAGNOSIS — E1165 Type 2 diabetes mellitus with hyperglycemia: Secondary | ICD-10-CM

## 2022-03-09 LAB — BAYER DCA HB A1C WAIVED: HB A1C (BAYER DCA - WAIVED): 7.9 % — ABNORMAL HIGH (ref 4.8–5.6)

## 2022-03-09 MED ORDER — ONDANSETRON 4 MG PO TBDP: 4.0000 mg | ORAL_TABLET | Freq: Once | ORAL | Status: DC

## 2022-03-09 MED ORDER — ONDANSETRON HCL 4 MG PO TABS
4.0000 mg | ORAL_TABLET | Freq: Three times a day (TID) | ORAL | 3 refills | Status: DC | PRN
  Filled 2022-03-09: qty 60, 20d supply, fill #0
  Filled 2022-04-16: qty 60, 20d supply, fill #1

## 2022-03-09 MED ORDER — ONDANSETRON HCL 4 MG PO TABS
4.0000 mg | ORAL_TABLET | Freq: Three times a day (TID) | ORAL | 6 refills | Status: DC | PRN
  Filled 2022-03-09: qty 20, 7d supply, fill #0

## 2022-03-09 MED ORDER — KETOROLAC TROMETHAMINE 60 MG/2ML IM SOLN
60.0000 mg | Freq: Once | INTRAMUSCULAR | Status: AC
  Administered 2022-03-09: 60 mg via INTRAMUSCULAR

## 2022-03-09 MED ORDER — HYDROCODONE-ACETAMINOPHEN 10-325 MG PO TABS
1.0000 | ORAL_TABLET | Freq: Three times a day (TID) | ORAL | 0 refills | Status: AC | PRN
  Filled 2022-03-09: qty 15, 5d supply, fill #0

## 2022-03-09 MED ORDER — ONDANSETRON HCL 4 MG PO TABS
4.0000 mg | ORAL_TABLET | Freq: Once | ORAL | Status: AC
  Administered 2022-03-09: 4 mg via ORAL

## 2022-03-09 MED ORDER — GABAPENTIN 100 MG PO CAPS
100.0000 mg | ORAL_CAPSULE | Freq: Three times a day (TID) | ORAL | 3 refills | Status: DC
  Filled 2022-03-09: qty 90, 30d supply, fill #0
  Filled 2022-04-16: qty 90, 30d supply, fill #1
  Filled 2022-08-07: qty 90, 30d supply, fill #2
  Filled 2022-09-04: qty 90, 30d supply, fill #3

## 2022-03-09 NOTE — Assessment & Plan Note (Signed)
Following with neurosurgery. Not feeling well. Will give toradol today and start norco and gabapentin. Call with any concerns. Continue to follow with them.

## 2022-03-09 NOTE — Progress Notes (Signed)
BP 120/88   Pulse (!) 102   Temp 97.8 F (36.6 C) (Oral)   Ht 4' 11.02" (1.499 m)   Wt 225 lb 14.4 oz (102.5 kg)   LMP  (LMP Unknown)   SpO2 99%   BMI 45.60 kg/m    Subjective:    Patient ID: Tricia Hill, female    DOB: Nov 21, 1970, 51 y.o.   MRN: [id number]  HPI: Tricia Hill is a 51 y.o. female  Chief Complaint  Patient presents with   Diabetes   DIABETES Hypoglycemic episodes:no Polydipsia/polyuria: no Visual disturbance: no Chest pain: no Paresthesias: yes Glucose Monitoring: yes  Accucheck frequency: Daily Taking Insulin?: no Blood Pressure Monitoring: a few times a month Retinal Examination: Up to Date Foot Exam: Up to Date Diabetic Education: Completed Pneumovax: Up to Date Influenza: Up to Date Aspirin: no  NECK PAIN  Status: uncontrolled Treatments attempted: epidural steroid injection, steroids  Compliant with recommended treatment: yes Relief with NSAIDs?:  no Location:Right Duration:months Severity: severe Quality: shooting and severe  Frequency: constant Radiation: R arm Aggravating factors: sitting, moving Alleviating factors: steroid injection for 1 day Weakness:  yes Paresthesias / decreased sensation:  yes  Fevers:  no   Relevant past medical, surgical, family and social history reviewed and updated as indicated. Interim medical history since our last visit reviewed. Allergies and medications reviewed and updated.  Review of Systems  Constitutional: Negative.   Respiratory: Negative.    Cardiovascular: Negative.   Gastrointestinal: Negative.   Musculoskeletal:  Positive for myalgias, neck pain and neck stiffness. Negative for arthralgias, back pain, gait problem and joint swelling.  Skin: Negative.   Neurological:  Positive for weakness and numbness. Negative for dizziness, tremors, seizures, syncope, facial asymmetry, speech difficulty, light-headedness and headaches.  Psychiatric/Behavioral: Negative.      Per HPI  unless specifically indicated above     Objective:    BP 120/88   Pulse (!) 102   Temp 97.8 F (36.6 C) (Oral)   Ht 4' 11.02" (1.499 m)   Wt 225 lb 14.4 oz (102.5 kg)   LMP  (LMP Unknown)   SpO2 99%   BMI 45.60 kg/m   Wt Readings from Last 3 Encounters:  03/09/22 225 lb 14.4 oz (102.5 kg)  02/03/22 238 lb (108 kg)  12/07/21 238 lb 6.4 oz (108.1 kg)    Physical Exam Vitals and nursing note reviewed.  Constitutional:      General: She is not in acute distress.    Appearance: Normal appearance. She is obese. She is ill-appearing. She is not toxic-appearing or diaphoretic.  HENT:     Head: Normocephalic and atraumatic.     Right Ear: External ear normal.     Left Ear: External ear normal.     Nose: Nose normal.     Mouth/Throat:     Mouth: Mucous membranes are moist.     Pharynx: Oropharynx is clear.  Eyes:     General: No scleral icterus.       Right eye: No discharge.        Left eye: No discharge.     Extraocular Movements: Extraocular movements intact.     Conjunctiva/sclera: Conjunctivae normal.     Pupils: Pupils are equal, round, and reactive to light.  Cardiovascular:     Rate and Rhythm: Normal rate and regular rhythm.     Pulses: Normal pulses.     Heart sounds: Normal heart sounds. No murmur heard.    No friction  rub. No gallop.  Pulmonary:     Effort: Pulmonary effort is normal. No respiratory distress.     Breath sounds: Normal breath sounds. No stridor. No wheezing, rhonchi or rales.  Chest:     Chest wall: No tenderness.  Musculoskeletal:        General: Normal range of motion.     Cervical back: Normal range of motion and neck supple.  Skin:    General: Skin is warm and dry.     Capillary Refill: Capillary refill takes less than 2 seconds.     Coloration: Skin is not jaundiced or pale.     Findings: No bruising, erythema, lesion or rash.  Neurological:     General: No focal deficit present.     Mental Status: She is alert and oriented to person,  place, and time. Mental status is at baseline.  Psychiatric:        Mood and Affect: Mood normal.        Behavior: Behavior normal.        Thought Content: Thought content normal.        Judgment: Judgment normal.     Results for orders placed or performed in visit on 03/09/22  Bayer DCA Hb A1c Waived  Result Value Ref Range   HB A1C (BAYER DCA - WAIVED) 7.9 (H) 4.8 - 5.6 %      Assessment & Plan:   Problem List Items Addressed This Visit       Endocrine   Type 2 diabetes mellitus with hyperglycemia ([place]) - Primary    Doing great with a1c of 7.9. Has had steroids and ESI so we expect this to be even better next time. Continue current regimen. Recheck 3 months.       Relevant Orders   Bayer DCA Hb A1c Waived (Completed)     Nervous and Auditory   Osteoarthritis of spine with radiculopathy, cervical region    Following with neurosurgery. Not feeling well. Will give toradol today and start norco and gabapentin. Call with any concerns. Continue to follow with them.      Relevant Medications   ketorolac (TORADOL) injection 60 mg (Start on 03/09/2022  9:45 AM)   HYDROcodone-acetaminophen (NORCO) 10-325 MG tablet   gabapentin (NEURONTIN) 100 MG capsule   Osteoarthritis of spine with radiculopathy, thoracic region    Following with neurosurgery. Not feeling well. Will give toradol today and start norco and gabapentin. Call with any concerns. Continue to follow with them.      Relevant Medications   ketorolac (TORADOL) injection 60 mg (Start on 03/09/2022  9:45 AM)   HYDROcodone-acetaminophen (NORCO) 10-325 MG tablet   gabapentin (NEURONTIN) 100 MG capsule   Other Visit Diagnoses     Nausea       Zofran given today and sent to pharmacy. Likley due to pain and rybelsus. Continue to monitor.    Relevant Medications   ondansetron (ZOFRAN-ODT) disintegrating tablet 4 mg (Start on 03/09/2022  9:45 AM)        Follow up plan: Return in about 3 months (around  06/08/2022).

## 2022-03-09 NOTE — Assessment & Plan Note (Signed)
Doing great with a1c of 7.9. Has had steroids and ESI so we expect this to be even better next time. Continue current regimen. Recheck 3 months.

## 2022-03-09 NOTE — Telephone Encounter (Signed)
  Chief Complaint: Missing medication Symptoms: Pt needs Zofran Frequency: today Pertinent Negatives: Patient denies  Disposition: '[]'$ ED /'[]'$ Urgent Care (no appt availability in office) / '[]'$ Appointment(In office/virtual)/ '[]'$  [facility]/ '[]'$ Home Care/ '[]'$ Refused Recommended Disposition /'[]'$ [facility]/ '[x]'$  Follow-up with PCP Additional Notes: Pt was seen today. It appears that Zofran was ordered, but was not transmitted to pharmacy.   Please resend ASAP.     Summary: Missing Rx following appt   Pt called reporting that she is missing her Zofran following today's visit. I see the Rx ordered but not receipt confirmed.      Reason for Disposition  [1] Prescription refill request for ESSENTIAL medicine (i.e., likelihood of harm to patient if not taken) AND [2] triager unable to refill per department policy  Answer Assessment - Initial Assessment Questions 1. DRUG NAME: "What medicine do you need to have refilled?"     Zofran 2. REFILLS REMAINING: "How many refills are remaining?" (Note: The label on the medicine or pill bottle will show how many refills are remaining. If there are no refills remaining, then a renewal may be needed.)      3. EXPIRATION DATE: "What is the expiration date?" (Note: The label states when the prescription will expire, and thus can no longer be refilled.)      4. PRESCRIBING HCP: "Who prescribed it?" Reason: If prescribed by specialist, call should be referred to that group.     Dr. Wynetta Emery 5. SYMPTOMS: "Do you have any symptoms?"      6. PREGNANCY: "Is there any chance that you are pregnant?" "When was your last menstrual period?"  Protocols used: Medication Refill and Renewal Call-A-AH

## 2022-03-09 NOTE — Addendum Note (Signed)
Addended by: Valerie Roys on: 03/09/2022 05:03 PM   Modules accepted: Orders

## 2022-03-15 DIAGNOSIS — M4802 Spinal stenosis, cervical region: Secondary | ICD-10-CM | POA: Diagnosis not present

## 2022-03-20 ENCOUNTER — Other Ambulatory Visit: Payer: Self-pay | Admitting: Neurosurgery

## 2022-04-03 NOTE — Progress Notes (Signed)
Surgical Instructions    Your procedure is scheduled on Tuesday, 04/13/22.  Report to [facility] Main Entrance "A" at 10:15 A.M., then check in with the Admitting office.  Call this number if you have problems the morning of surgery:  [phone]   If you have any questions prior to your surgery date call [phone]: Open Monday-Friday 8am-4pm If you experience any cold or flu symptoms such as cough, fever, chills, shortness of breath, etc. between now and your scheduled surgery, please notify us at the above number     Remember:  Do not eat or drink after midnight the night before your surgery     Take these medicines the morning of surgery with A SIP OF WATER:  escitalopram (LEXAPRO)  IF NEEDED: ondansetron (ZOFRAN)  As of today, STOP taking any Aspirin (unless otherwise instructed by your surgeon) Aleve, Naproxen, Ibuprofen, Motrin, Advil, Goody's, BC's, all herbal medications, fish oil, and all vitamins.  WHAT DO I DO ABOUT MY DIABETES MEDICATION?   Do not take oral diabetes medicines (pills) the morning of surgery.  THE MORNING OF SURGERY, do not take metFORMIN (GLUCOPHAGE).  Do not take Semaglutide (RYBELSUS) 24 hours prior to surgery. Your last dose will be 04/11/22.  The day of surgery, do not take other diabetes injectables, including Byetta (exenatide), Bydureon (exenatide ER), Victoza (liraglutide), or Trulicity (dulaglutide).  If your CBG is greater than 220 mg/dL, you may take  of your sliding scale (correction) dose of insulin.   HOW TO MANAGE YOUR DIABETES BEFORE AND AFTER SURGERY  Why is it important to control my blood sugar before and after surgery? Improving blood sugar levels before and after surgery helps healing and can limit problems. A way of improving blood sugar control is eating a healthy diet by:  Eating less sugar and carbohydrates  Increasing activity/exercise  Talking with your doctor about reaching your blood sugar goals High blood  sugars (greater than 180 mg/dL) can raise your risk of infections and slow your recovery, so you will need to focus on controlling your diabetes during the weeks before surgery. Make sure that the doctor who takes care of your diabetes knows about your planned surgery including the date and location.  How do I manage my blood sugar before surgery? Check your blood sugar at least 4 times a day, starting 2 days before surgery, to make sure that the level is not too high or low.  Check your blood sugar the morning of your surgery when you wake up and every 2 hours until you get to the Short Stay unit.  If your blood sugar is less than 70 mg/dL, you will need to treat for low blood sugar: Do not take insulin. Treat a low blood sugar (less than 70 mg/dL) with  cup of clear juice (cranberry or apple), 4 glucose tablets, OR glucose gel. Recheck blood sugar in 15 minutes after treatment (to make sure it is greater than 70 mg/dL). If your blood sugar is not greater than 70 mg/dL on recheck, call [phone] for further instructions. Report your blood sugar to the short stay nurse when you get to Short Stay.  If you are admitted to the hospital after surgery: Your blood sugar will be checked by the staff and you will probably be given insulin after surgery (instead of oral diabetes medicines) to make sure you have good blood sugar levels. The goal for blood sugar control after surgery is 80-180 mg/dL.  Do not wear jewelry or makeup. Do not wear lotions, powders, perfumes or deodorant. Do not shave 48 hours prior to surgery.   Do not bring valuables to the hospital. Do not wear nail polish, gel polish, artificial nails, or any other type of covering on natural nails (fingers and toes) If you have artificial nails or gel coating that need to be removed by a nail salon, please have this removed prior to surgery. Artificial nails or gel coating may interfere with anesthesia's ability to  adequately monitor your vital signs.  [facility] is not responsible for any belongings or valuables.    Do NOT Smoke (Tobacco/Vaping)  24 hours prior to your procedure  If you use a CPAP at night, you may bring your mask for your overnight stay.   Contacts, glasses, hearing aids, dentures or partials may not be worn into surgery, please bring cases for these belongings   For patients admitted to the hospital, discharge time will be determined by your treatment team.   Patients discharged the day of surgery will not be allowed to drive home, and someone needs to stay with them for 24 hours.   SURGICAL WAITING ROOM VISITATION Patients having surgery or a procedure may have no more than 2 support people in the waiting area - these visitors may rotate.   Children under the age of 63 must have an adult with them who is not the patient. If the patient needs to stay at the hospital during part of their recovery, the visitor guidelines for inpatient rooms apply. Pre-op nurse will coordinate an appropriate time for 1 support person to accompany patient in pre-op.  This support person may not rotate.   Please refer to [email or web address][employer]hu for the visitor guidelines for Inpatients (after your surgery is over and you are in a regular room).    Special instructions:    Oral Hygiene is also important to reduce your risk of infection.  Remember - BRUSH YOUR TEETH THE MORNING OF SURGERY WITH YOUR REGULAR TOOTHPASTE   [facility]- Preparing For Surgery  Before surgery, you can play an important role. Because skin is not sterile, your skin needs to be as free of germs as possible. You can reduce the number of germs on your skin by washing with CHG (chlorahexidine gluconate) Soap before surgery.  CHG is an antiseptic cleaner which kills germs and bonds with the skin to continue killing germs even after washing.     Please do not use if you have an  allergy to CHG or antibacterial soaps. If your skin becomes reddened/irritated stop using the CHG.  Do not shave (including legs and underarms) for at least 48 hours prior to first CHG shower. It is OK to shave your face.  Please follow these instructions carefully.     Shower the NIGHT BEFORE SURGERY and the MORNING OF SURGERY with CHG Soap.   If you chose to wash your hair, wash your hair first as usual with your normal shampoo. After you shampoo, rinse your hair and body thoroughly to remove the shampoo.  Then [employer] and genitals (private parts) with your normal soap and rinse thoroughly to remove soap.  After that Use CHG Soap as you would any other liquid soap. You can apply CHG directly to the skin and wash gently with a scrungie or a clean washcloth.   Apply the CHG Soap to your body ONLY FROM THE NECK DOWN.  Do not use on open wounds or open sores.  Avoid contact with your eyes, ears, mouth and genitals (private parts). Wash Face and genitals (private parts)  with your normal soap.   Wash thoroughly, paying special attention to the area where your surgery will be performed.  Thoroughly rinse your body with warm water from the neck down.  DO NOT shower/wash with your normal soap after using and rinsing off the CHG Soap.  Pat yourself dry with a CLEAN TOWEL.  Wear CLEAN PAJAMAS to bed the night before surgery  Place CLEAN SHEETS on your bed the night before your surgery  DO NOT SLEEP WITH PETS.   Day of Surgery: Take a shower with CHG soap. Wear Clean/Comfortable clothing the morning of surgery Do not apply any deodorants/lotions.   Remember to brush your teeth WITH YOUR REGULAR TOOTHPASTE.    If you received a COVID test during your pre-op visit, it is requested that you wear a mask when out in public, stay away from anyone that may not be feeling well, and notify your surgeon if you develop symptoms. If you have been in contact with anyone that has tested positive in the  last 10 days, please notify your surgeon.    Please read over the following fact sheets that you were given.

## 2022-04-04 ENCOUNTER — Encounter (HOSPITAL_COMMUNITY): Payer: Self-pay

## 2022-04-04 ENCOUNTER — Other Ambulatory Visit: Payer: Self-pay

## 2022-04-04 ENCOUNTER — Encounter (HOSPITAL_COMMUNITY)
Admission: RE | Admit: 2022-04-04 | Discharge: 2022-04-04 | Disposition: A | Discharge status: Home / Self Care | Payer: 59 | Source: Ambulatory Visit | Attending: Neurosurgery | Admitting: Neurosurgery

## 2022-04-04 VITALS — BP 144/83 | HR 80 | Temp 98.4°F | Resp 18 | Ht 59.0 in | Wt 237.0 lb

## 2022-04-04 DIAGNOSIS — E1165 Type 2 diabetes mellitus with hyperglycemia: Secondary | ICD-10-CM | POA: Diagnosis not present

## 2022-04-04 DIAGNOSIS — Z01818 Encounter for other preprocedural examination: Secondary | ICD-10-CM | POA: Diagnosis not present

## 2022-04-04 HISTORY — DX: Nausea with vomiting, unspecified: R11.2

## 2022-04-04 HISTORY — DX: Other specified postprocedural states: Z98.890

## 2022-04-04 LAB — CBC
HCT: 37.4 % (ref 36.0–46.0)
Hemoglobin: 12.1 g/dL (ref 12.0–15.0)
MCH: 28.9 pg (ref 26.0–34.0)
MCHC: 32.4 g/dL (ref 30.0–36.0)
MCV: 89.5 fL (ref 80.0–100.0)
Platelets: 273 10*3/uL (ref 150–400)
RBC: 4.18 MIL/uL (ref 3.87–5.11)
RDW: 13.5 % (ref 11.5–15.5)
WBC: 8 10*3/uL (ref 4.0–10.5)
nRBC: 0 % (ref 0.0–0.2)

## 2022-04-04 LAB — BASIC METABOLIC PANEL
Anion gap: 9 (ref 5–15)
BUN: 8 mg/dL (ref 6–20)
CO2: 25 mmol/L (ref 22–32)
Calcium: 8.5 mg/dL — ABNORMAL LOW (ref 8.9–10.3)
Chloride: 104 mmol/L (ref 98–111)
Creatinine, Ser: 0.64 mg/dL (ref 0.44–1.00)
GFR, Estimated: >60 mL/min (ref >60)
Glucose, Bld: 212 mg/dL — ABNORMAL HIGH (ref 70–99)
Potassium: 3.5 mmol/L (ref 3.5–5.1)
Sodium: 138 mmol/L (ref 135–145)

## 2022-04-04 LAB — SURGICAL PCR SCREEN
MRSA, PCR: NEGATIVE
Staphylococcus aureus: POSITIVE — AB

## 2022-04-04 LAB — GLUCOSE, CAPILLARY: Glucose-Capillary: 220 mg/dL — ABNORMAL HIGH (ref 70–99)

## 2022-04-04 NOTE — Progress Notes (Signed)
PCP - Park Liter Cardiologist - denies  PPM/ICD - denies   Chest x-ray - n/a EKG - 04/04/22 Stress Test - denies ECHO - denies Cardiac Cath - denies  Sleep Study - +OSA CPAP - wear nightly, unsure of settings (autotitrate?)  Fasting Blood Sugar - 140-150 Checks Blood Sugar 3 times a day  Last dose of GLP1 agonist-  daily 10/25 GLP1 instructions: advised to stop 24 hours before surgery  As of today, STOP taking any Aspirin (unless otherwise instructed by your surgeon) Aleve, Naproxen, Ibuprofen, Motrin, Advil, Goody's, BC's, all herbal medications, fish oil, and all vitamins.  ERAS Protcol -no   COVID TEST- not needed   Anesthesia review: no  Patient denies shortness of breath, fever, cough and chest pain at PAT appointment   All instructions explained to the patient, with a verbal understanding of the material. Patient agrees to go over the instructions while at home for a better understanding. Patient also instructed to self quarantine after being tested for COVID-19. The opportunity to ask questions was provided.  '

## 2022-04-09 DIAGNOSIS — M4802 Spinal stenosis, cervical region: Secondary | ICD-10-CM | POA: Diagnosis not present

## 2022-04-13 ENCOUNTER — Ambulatory Visit (HOSPITAL_BASED_OUTPATIENT_CLINIC_OR_DEPARTMENT_OTHER): Payer: 59 | Admitting: Certified Registered"

## 2022-04-13 ENCOUNTER — Encounter (HOSPITAL_COMMUNITY): Payer: Self-pay | Admitting: Neurosurgery

## 2022-04-13 ENCOUNTER — Ambulatory Visit (HOSPITAL_COMMUNITY): Payer: 59 | Admitting: Certified Registered"

## 2022-04-13 ENCOUNTER — Observation Stay (HOSPITAL_COMMUNITY)
Admission: RE | Admit: 2022-04-13 | Discharge: 2022-04-14 | Disposition: A | Discharge status: Home / Self Care | Payer: 59 | Attending: Neurosurgery | Admitting: Neurosurgery

## 2022-04-13 ENCOUNTER — Ambulatory Visit (HOSPITAL_COMMUNITY): Payer: 59

## 2022-04-13 ENCOUNTER — Other Ambulatory Visit: Payer: Self-pay

## 2022-04-13 ENCOUNTER — Ambulatory Visit (HOSPITAL_COMMUNITY)
Admission: RE | Disposition: A | Discharge status: Home / Self Care | Payer: Self-pay | Source: Home / Self Care | Attending: Neurosurgery

## 2022-04-13 DIAGNOSIS — I10 Essential (primary) hypertension: Secondary | ICD-10-CM | POA: Insufficient documentation

## 2022-04-13 DIAGNOSIS — M5413 Radiculopathy, cervicothoracic region: Secondary | ICD-10-CM

## 2022-04-13 DIAGNOSIS — M4712 Other spondylosis with myelopathy, cervical region: Secondary | ICD-10-CM | POA: Diagnosis present

## 2022-04-13 DIAGNOSIS — E1165 Type 2 diabetes mellitus with hyperglycemia: Secondary | ICD-10-CM

## 2022-04-13 DIAGNOSIS — F419 Anxiety disorder, unspecified: Secondary | ICD-10-CM | POA: Diagnosis not present

## 2022-04-13 DIAGNOSIS — M4313 Spondylolisthesis, cervicothoracic region: Secondary | ICD-10-CM | POA: Insufficient documentation

## 2022-04-13 DIAGNOSIS — E119 Type 2 diabetes mellitus without complications: Secondary | ICD-10-CM | POA: Diagnosis not present

## 2022-04-13 DIAGNOSIS — Z79899 Other long term (current) drug therapy: Secondary | ICD-10-CM | POA: Insufficient documentation

## 2022-04-13 DIAGNOSIS — R110 Nausea: Secondary | ICD-10-CM

## 2022-04-13 DIAGNOSIS — Z7984 Long term (current) use of oral hypoglycemic drugs: Secondary | ICD-10-CM | POA: Diagnosis not present

## 2022-04-13 DIAGNOSIS — G4733 Obstructive sleep apnea (adult) (pediatric): Secondary | ICD-10-CM | POA: Diagnosis not present

## 2022-04-13 DIAGNOSIS — M4803 Spinal stenosis, cervicothoracic region: Secondary | ICD-10-CM

## 2022-04-13 DIAGNOSIS — Z9989 Dependence on other enabling machines and devices: Secondary | ICD-10-CM | POA: Diagnosis not present

## 2022-04-13 DIAGNOSIS — M4802 Spinal stenosis, cervical region: Secondary | ICD-10-CM | POA: Diagnosis not present

## 2022-04-13 DIAGNOSIS — J45909 Unspecified asthma, uncomplicated: Secondary | ICD-10-CM | POA: Diagnosis not present

## 2022-04-13 DIAGNOSIS — Z981 Arthrodesis status: Secondary | ICD-10-CM | POA: Diagnosis not present

## 2022-04-13 HISTORY — PX: ANTERIOR CERVICAL DECOMP/DISCECTOMY FUSION: SHX1161

## 2022-04-13 LAB — GLUCOSE, CAPILLARY
Glucose-Capillary: 186 mg/dL — ABNORMAL HIGH (ref 70–99)
Glucose-Capillary: 166 mg/dL — ABNORMAL HIGH (ref 70–99)
Glucose-Capillary: 178 mg/dL — ABNORMAL HIGH (ref 70–99)
Glucose-Capillary: 240 mg/dL — ABNORMAL HIGH (ref 70–99)
Glucose-Capillary: 274 mg/dL — ABNORMAL HIGH (ref 70–99)

## 2022-04-13 LAB — HEMOGLOBIN A1C
Hgb A1c MFr Bld: 7.7 % — ABNORMAL HIGH (ref 4.8–5.6)
Mean Plasma Glucose: 174.29 mg/dL

## 2022-04-13 SURGERY — ANTERIOR CERVICAL DECOMPRESSION/DISCECTOMY FUSION 1 LEVEL/HARDWARE REMOVAL
Anesthesia: General | Site: Spine Cervical

## 2022-04-13 MED ORDER — GABAPENTIN 100 MG PO CAPS
100.0000 mg | ORAL_CAPSULE | Freq: Every day | ORAL | Status: DC | PRN
  Filled 2022-04-13: qty 1

## 2022-04-13 MED ORDER — CHLORHEXIDINE GLUCONATE CLOTH 2 % EX PADS: 6.0000 | MEDICATED_PAD | Freq: Once | CUTANEOUS | Status: DC

## 2022-04-13 MED ORDER — ROCURONIUM BROMIDE 10 MG/ML (PF) SYRINGE
PREFILLED_SYRINGE | INTRAVENOUS | Status: AC
  Filled 2022-04-13: qty 10

## 2022-04-13 MED ORDER — INSULIN ASPART 100 UNIT/ML IJ SOLN: 0.0000 [IU] | Freq: Three times a day (TID) | INTRAMUSCULAR | Status: DC

## 2022-04-13 MED ORDER — SODIUM CHLORIDE 0.9% FLUSH
3.0000 mL | Freq: Two times a day (BID) | INTRAVENOUS | Status: DC
  Administered 2022-04-13: 3 mL via INTRAVENOUS

## 2022-04-13 MED ORDER — ONDANSETRON HCL 4 MG/2ML IJ SOLN
INTRAMUSCULAR | Status: DC | PRN
  Administered 2022-04-13: 4 mg via INTRAVENOUS

## 2022-04-13 MED ORDER — DEXMEDETOMIDINE HCL IN NACL 80 MCG/20ML IV SOLN
INTRAVENOUS | Status: DC | PRN
  Administered 2022-04-13: 8 ug via BUCCAL
  Administered 2022-04-13: 4 ug via BUCCAL
  Administered 2022-04-13: 8 ug via BUCCAL

## 2022-04-13 MED ORDER — PROPOFOL 10 MG/ML IV BOLUS
INTRAVENOUS | Status: AC
  Filled 2022-04-13: qty 20

## 2022-04-13 MED ORDER — PROMETHAZINE HCL 25 MG/ML IJ SOLN: 6.2500 mg | INTRAMUSCULAR | Status: DC | PRN

## 2022-04-13 MED ORDER — CHLORHEXIDINE GLUCONATE 0.12 % MT SOLN
15.0000 mL | Freq: Once | OROMUCOSAL | Status: DC
  Filled 2022-04-13: qty 15

## 2022-04-13 MED ORDER — MIDAZOLAM HCL 2 MG/2ML IJ SOLN
INTRAMUSCULAR | Status: DC | PRN
  Administered 2022-04-13: 2 mg via INTRAVENOUS

## 2022-04-13 MED ORDER — MIDAZOLAM HCL 2 MG/2ML IJ SOLN
INTRAMUSCULAR | Status: AC
  Filled 2022-04-13: qty 2

## 2022-04-13 MED ORDER — THROMBIN 5000 UNITS EX SOLR: OROMUCOSAL | Status: DC | PRN

## 2022-04-13 MED ORDER — THROMBIN 5000 UNITS EX SOLR
CUTANEOUS | Status: AC
  Filled 2022-04-13: qty 15000

## 2022-04-13 MED ORDER — HYDROCODONE-ACETAMINOPHEN 10-325 MG PO TABS
1.0000 | ORAL_TABLET | ORAL | Status: DC | PRN
  Administered 2022-04-13 – 2022-04-14 (×3): 1 via ORAL
  Filled 2022-04-13 (×3): qty 1

## 2022-04-13 MED ORDER — LIDOCAINE 2% (20 MG/ML) 5 ML SYRINGE
INTRAMUSCULAR | Status: AC
  Filled 2022-04-13: qty 5

## 2022-04-13 MED ORDER — PROPOFOL 10 MG/ML IV BOLUS
INTRAVENOUS | Status: DC | PRN
  Administered 2022-04-13: 160 mg via INTRAVENOUS

## 2022-04-13 MED ORDER — ATORVASTATIN CALCIUM 40 MG PO TABS: 40.0000 mg | ORAL_TABLET | ORAL | Status: DC

## 2022-04-13 MED ORDER — ACETAMINOPHEN 325 MG PO TABS: 650.0000 mg | ORAL_TABLET | ORAL | Status: DC | PRN

## 2022-04-13 MED ORDER — LACTATED RINGERS IV SOLN: INTRAVENOUS | Status: DC

## 2022-04-13 MED ORDER — CEFAZOLIN SODIUM-DEXTROSE 2-4 GM/100ML-% IV SOLN
2.0000 g | INTRAVENOUS | Status: AC
  Administered 2022-04-13: 2 g via INTRAVENOUS
  Filled 2022-04-13: qty 100

## 2022-04-13 MED ORDER — DEXAMETHASONE SODIUM PHOSPHATE 10 MG/ML IJ SOLN
INTRAMUSCULAR | Status: DC | PRN
  Administered 2022-04-13: 4 mg via INTRAVENOUS

## 2022-04-13 MED ORDER — ROCURONIUM BROMIDE 10 MG/ML (PF) SYRINGE
PREFILLED_SYRINGE | INTRAVENOUS | Status: DC | PRN
  Administered 2022-04-13: 80 mg via INTRAVENOUS
  Administered 2022-04-13: 20 mg via INTRAVENOUS

## 2022-04-13 MED ORDER — ONDANSETRON HCL 4 MG/2ML IJ SOLN
INTRAMUSCULAR | Status: AC
  Filled 2022-04-13: qty 2

## 2022-04-13 MED ORDER — ONDANSETRON HCL 4 MG PO TABS: 4.0000 mg | ORAL_TABLET | Freq: Four times a day (QID) | ORAL | Status: DC | PRN

## 2022-04-13 MED ORDER — MENTHOL 3 MG MT LOZG: 1.0000 | LOZENGE | OROMUCOSAL | Status: DC | PRN

## 2022-04-13 MED ORDER — FENTANYL CITRATE (PF) 100 MCG/2ML IJ SOLN
INTRAMUSCULAR | Status: AC
  Filled 2022-04-13: qty 2

## 2022-04-13 MED ORDER — SUGAMMADEX SODIUM 200 MG/2ML IV SOLN
INTRAVENOUS | Status: DC | PRN
  Administered 2022-04-13: 100 mg via INTRAVENOUS
  Administered 2022-04-13: 200 mg via INTRAVENOUS

## 2022-04-13 MED ORDER — FENTANYL CITRATE (PF) 250 MCG/5ML IJ SOLN
INTRAMUSCULAR | Status: AC
  Filled 2022-04-13: qty 5

## 2022-04-13 MED ORDER — CYCLOBENZAPRINE HCL 10 MG PO TABS
10.0000 mg | ORAL_TABLET | Freq: Three times a day (TID) | ORAL | Status: DC | PRN
  Administered 2022-04-13 – 2022-04-14 (×2): 10 mg via ORAL
  Filled 2022-04-13 (×2): qty 1

## 2022-04-13 MED ORDER — DEXAMETHASONE SODIUM PHOSPHATE 10 MG/ML IJ SOLN
INTRAMUSCULAR | Status: AC
  Filled 2022-04-13: qty 1

## 2022-04-13 MED ORDER — ACETAMINOPHEN 500 MG PO TABS
1000.0000 mg | ORAL_TABLET | Freq: Once | ORAL | Status: AC
  Administered 2022-04-13: 1000 mg via ORAL
  Filled 2022-04-13: qty 2

## 2022-04-13 MED ORDER — SODIUM CHLORIDE 0.9% FLUSH: 3.0000 mL | INTRAVENOUS | Status: DC | PRN

## 2022-04-13 MED ORDER — FENTANYL CITRATE (PF) 250 MCG/5ML IJ SOLN
INTRAMUSCULAR | Status: DC | PRN
  Administered 2022-04-13: 100 ug via INTRAVENOUS
  Administered 2022-04-13 (×3): 50 ug via INTRAVENOUS

## 2022-04-13 MED ORDER — INSULIN ASPART 100 UNIT/ML IJ SOLN
0.0000 [IU] | INTRAMUSCULAR | Status: AC | PRN
  Administered 2022-04-13: 4 [IU] via SUBCUTANEOUS
  Administered 2022-04-13: 2 [IU] via SUBCUTANEOUS
  Filled 2022-04-13: qty 1

## 2022-04-13 MED ORDER — THROMBIN (RECOMBINANT) 5000 UNITS EX SOLR: CUTANEOUS | Status: DC | PRN

## 2022-04-13 MED ORDER — ONDANSETRON HCL 4 MG/2ML IJ SOLN
4.0000 mg | Freq: Four times a day (QID) | INTRAMUSCULAR | Status: DC | PRN
  Administered 2022-04-13: 4 mg via INTRAVENOUS
  Filled 2022-04-13: qty 2

## 2022-04-13 MED ORDER — SCOPOLAMINE 1 MG/3DAYS TD PT72
1.0000 | MEDICATED_PATCH | TRANSDERMAL | Status: DC
  Administered 2022-04-13: 1.5 mg via TRANSDERMAL
  Filled 2022-04-13: qty 1

## 2022-04-13 MED ORDER — PHENOL 1.4 % MT LIQD: 1.0000 | OROMUCOSAL | Status: DC | PRN

## 2022-04-13 MED ORDER — ACETAMINOPHEN 650 MG RE SUPP: 650.0000 mg | RECTAL | Status: DC | PRN

## 2022-04-13 MED ORDER — FENTANYL CITRATE (PF) 100 MCG/2ML IJ SOLN
25.0000 ug | INTRAMUSCULAR | Status: DC | PRN
  Administered 2022-04-13 (×2): 50 ug via INTRAVENOUS

## 2022-04-13 MED ORDER — ORAL CARE MOUTH RINSE: 15.0000 mL | Freq: Once | OROMUCOSAL | Status: DC

## 2022-04-13 MED ORDER — SODIUM CHLORIDE 0.9 % IV SOLN: 250.0000 mL | INTRAVENOUS | Status: DC

## 2022-04-13 MED ORDER — ONDANSETRON HCL 4 MG PO TABS: 4.0000 mg | ORAL_TABLET | Freq: Three times a day (TID) | ORAL | Status: DC | PRN

## 2022-04-13 MED ORDER — CEFAZOLIN SODIUM-DEXTROSE 1-4 GM/50ML-% IV SOLN
1.0000 g | Freq: Three times a day (TID) | INTRAVENOUS | Status: AC
  Administered 2022-04-13 – 2022-04-14 (×2): 1 g via INTRAVENOUS
  Filled 2022-04-13 (×2): qty 50

## 2022-04-13 MED ORDER — 0.9 % SODIUM CHLORIDE (POUR BTL) OPTIME
TOPICAL | Status: DC | PRN
  Administered 2022-04-13: 1000 mL

## 2022-04-13 MED ORDER — PROPOFOL 500 MG/50ML IV EMUL
INTRAVENOUS | Status: DC | PRN
  Administered 2022-04-13: 25 ug/kg/min via INTRAVENOUS

## 2022-04-13 MED ORDER — INSULIN ASPART 100 UNIT/ML IJ SOLN
0.0000 [IU] | Freq: Three times a day (TID) | INTRAMUSCULAR | Status: DC
  Administered 2022-04-14: 3 [IU] via SUBCUTANEOUS

## 2022-04-13 MED ORDER — AMISULPRIDE (ANTIEMETIC) 5 MG/2ML IV SOLN: 10.0000 mg | Freq: Once | INTRAVENOUS | Status: DC | PRN

## 2022-04-13 MED ORDER — METFORMIN HCL 500 MG PO TABS
1000.0000 mg | ORAL_TABLET | Freq: Two times a day (BID) | ORAL | Status: DC
  Administered 2022-04-13 – 2022-04-14 (×2): 1000 mg via ORAL
  Filled 2022-04-13 (×2): qty 2

## 2022-04-13 MED ORDER — ESCITALOPRAM OXALATE 10 MG PO TABS
5.0000 mg | ORAL_TABLET | Freq: Every day | ORAL | Status: DC
  Administered 2022-04-13: 5 mg via ORAL
  Filled 2022-04-13: qty 1

## 2022-04-13 MED ORDER — LIDOCAINE 2% (20 MG/ML) 5 ML SYRINGE
INTRAMUSCULAR | Status: DC | PRN
  Administered 2022-04-13: 100 mg via INTRAVENOUS

## 2022-04-13 MED ORDER — ONDANSETRON 4 MG PO TBDP: 4.0000 mg | ORAL_TABLET | Freq: Once | ORAL | Status: DC

## 2022-04-13 MED ORDER — LOSARTAN POTASSIUM 25 MG PO TABS
12.5000 mg | ORAL_TABLET | Freq: Every day | ORAL | Status: DC
  Filled 2022-04-13 (×2): qty 0.5

## 2022-04-13 MED ORDER — HYDROCODONE-ACETAMINOPHEN 5-325 MG PO TABS: 1.0000 | ORAL_TABLET | ORAL | Status: DC | PRN

## 2022-04-13 MED ORDER — HYDROMORPHONE HCL 1 MG/ML IJ SOLN
1.0000 mg | INTRAMUSCULAR | Status: DC | PRN
  Administered 2022-04-13: 1 mg via INTRAVENOUS
  Filled 2022-04-13: qty 1

## 2022-04-13 SURGICAL SUPPLY — 50 items
BAG COUNTER SPONGE SURGICOUNT (BAG) ×1 IMPLANT
BAG DECANTER FOR FLEXI CONT (MISCELLANEOUS) ×1 IMPLANT
BAND RUBBER #18 3X1/16 STRL (MISCELLANEOUS) ×2 IMPLANT
BENZOIN TINCTURE PRP APPL 2/3 (GAUZE/BANDAGES/DRESSINGS) ×1 IMPLANT
BIT DRILL 13 (BIT) IMPLANT
BUR MATCHSTICK NEURO 3.0 LAGG (BURR) ×1 IMPLANT
CAGE PEEK 8X14X11 (Cage) IMPLANT
CANISTER SUCT 3000ML PPV (MISCELLANEOUS) ×1 IMPLANT
CLSR STERI-STRIP ANTIMIC 1/2X4 (GAUZE/BANDAGES/DRESSINGS) IMPLANT
DRAPE C-ARM 42X72 X-RAY (DRAPES) ×2 IMPLANT
DRAPE LAPAROTOMY 100X72 PEDS (DRAPES) ×1 IMPLANT
DRAPE MICROSCOPE SLANT 54X150 (MISCELLANEOUS) ×1 IMPLANT
DURAPREP 6ML APPLICATOR 50/CS (WOUND CARE) ×1 IMPLANT
ELECT COATED BLADE 2.86 ST (ELECTRODE) ×1 IMPLANT
ELECT REM PT RETURN 9FT ADLT (ELECTROSURGICAL) ×1
ELECTRODE REM PT RTRN 9FT ADLT (ELECTROSURGICAL) ×1 IMPLANT
GAUZE 4X4 16PLY ~~LOC~~+RFID DBL (SPONGE) IMPLANT
GAUZE SPONGE 4X4 12PLY STRL (GAUZE/BANDAGES/DRESSINGS) ×1 IMPLANT
GLOVE ECLIPSE 9.0 STRL (GLOVE) ×1 IMPLANT
GLOVE EXAM NITRILE XL STR (GLOVE) IMPLANT
GOWN STRL REUS W/ TWL LRG LVL3 (GOWN DISPOSABLE) IMPLANT
GOWN STRL REUS W/ TWL XL LVL3 (GOWN DISPOSABLE) IMPLANT
GOWN STRL REUS W/TWL 2XL LVL3 (GOWN DISPOSABLE) IMPLANT
GOWN STRL REUS W/TWL LRG LVL3 (GOWN DISPOSABLE) ×2
GOWN STRL REUS W/TWL XL LVL3 (GOWN DISPOSABLE) ×1
HALTER HD/CHIN CERV TRACTION D (MISCELLANEOUS) ×1 IMPLANT
HEMOSTAT POWDER KIT SURGIFOAM (HEMOSTASIS) IMPLANT
KIT BASIN OR (CUSTOM PROCEDURE TRAY) ×1 IMPLANT
KIT TURNOVER KIT B (KITS) ×1 IMPLANT
NDL SPNL 20GX3.5 QUINCKE YW (NEEDLE) ×1 IMPLANT
NEEDLE SPNL 20GX3.5 QUINCKE YW (NEEDLE) ×1 IMPLANT
NS IRRIG 1000ML POUR BTL (IV SOLUTION) ×1 IMPLANT
PACK LAMINECTOMY NEURO (CUSTOM PROCEDURE TRAY) ×1 IMPLANT
PAD ARMBOARD 7.5X6 YLW CONV (MISCELLANEOUS) ×3 IMPLANT
PLATE ELITE VISION 25MM (Plate) IMPLANT
RASP 3.0MM (RASP) IMPLANT
SCREW ST 13X4.5XST VAR NS (Screw) IMPLANT
SCREW ST 13X4XST VA NS SPNE (Screw) IMPLANT
SCREW ST VAR 4 ATL (Screw) ×2 IMPLANT
SCREW ST VAR 4.5 ATL (Screw) ×2 IMPLANT
SPONGE INTESTINAL PEANUT (DISPOSABLE) ×1 IMPLANT
SPONGE SURGIFOAM ABS GEL SZ50 (HEMOSTASIS) ×1 IMPLANT
STRIP CLOSURE SKIN 1/2X4 (GAUZE/BANDAGES/DRESSINGS) ×1 IMPLANT
SUT VIC AB 3-0 SH 8-18 (SUTURE) ×1 IMPLANT
SUT VIC AB 4-0 RB1 18 (SUTURE) ×1 IMPLANT
TAPE CLOTH 4X10 WHT NS (GAUZE/BANDAGES/DRESSINGS) ×1 IMPLANT
TOWEL GREEN STERILE (TOWEL DISPOSABLE) ×1 IMPLANT
TOWEL GREEN STERILE FF (TOWEL DISPOSABLE) ×1 IMPLANT
TRAP SPECIMEN MUCUS 40CC (MISCELLANEOUS) ×1 IMPLANT
WATER STERILE IRR 1000ML POUR (IV SOLUTION) ×1 IMPLANT

## 2022-04-13 NOTE — H&P (Signed)
Tricia Hill is an 51 y.o. female.   Chief Complaint: Neck pain HPI: 51 year old female status post prior anterior cervical decompression and fusion from C4-C7 by another physician.  Patient with severe posterior cervical pain failing all conservative management including injection management.  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  Patient presents now for C7-T1 anterior cervical decompression and fusion in hopes improving her symptoms.  Past Medical History:  Diagnosis Date   Asthma    Diabetes mellitus without complication ([place]) 5465   GERD (gastroesophageal reflux disease)    Heart murmur    child   Hypertension    PONV (postoperative nausea and vomiting)    Shingles    Sleep apnea    USE CPAP    Past Surgical History:  Procedure Laterality Date   CESAREAN SECTION     X 2   CHOLECYSTECTOMY N/A 01/18/2017   Procedure: LAPAROSCOPIC CHOLECYSTECTOMY WITH INTRAOPERATIVE CHOLANGIOGRAM;  Surgeon: Robert Bellow, MD;  Location: [facility] ORS;  Service: General;  Laterality: N/A;   COLONOSCOPY WITH PROPOFOL N/A 10/13/2020   Procedure: COLONOSCOPY WITH PROPOFOL;  Surgeon: Lin Landsman, MD;  Location: [facility]COPY;  Service: Gastroenterology;  Laterality: N/A;  COVID POSITIVE 09/30/2020   DIAGNOSTIC LAPAROSCOPY  2006   EXCISION OF ABDOMINAL WALL ENDOMETRIOMA   SPINAL FUSION  2012   C3 and C4   TOTAL ABDOMINAL HYSTERECTOMY     Partial    Family History  Problem Relation Age of Onset   Hypertension Mother    Diabetes Mother    Hypertension Father    Berenice Primas' disease Sister    Clotting disorder Brother    Heart disease Maternal Grandmother    Heart disease Maternal Grandfather    Kidney disease Neg Hx    Breast cancer Neg Hx    Social History:  reports that she has never smoked. She has never used smokeless tobacco. She reports that she does not currently use alcohol. She reports  that she does not use drugs.  Allergies:  Allergies  Allergen Reactions   Lisinopril Cough   Ultram [Tramadol Hcl] Nausea And Vomiting    Severe vomiting    Victoza [Liraglutide] Nausea And Vomiting    Facility-Administered Medications Prior to Admission  Medication Dose Route Frequency Provider Last Rate Last Admin   ondansetron (ZOFRAN-ODT) disintegrating tablet 4 mg  4 mg Oral Once Johnson, Megan P, DO       Medications Prior to Admission  Medication Sig Dispense Refill   atorvastatin (LIPITOR) 40 MG tablet Take 1 tablet (40 mg total) by mouth once a week. (Patient taking differently: Take 40 mg by mouth every Wednesday.) 52 tablet 0   escitalopram (LEXAPRO) 5 MG tablet Take 1 tablet (5 mg total) by mouth daily. 90 tablet 1   gabapentin (NEURONTIN) 100 MG capsule Take 1 capsule (100 mg total) by mouth 3 (three) times daily. (Patient taking differently: Take 100 mg by mouth daily as needed (Back pain).) 90 capsule 3   losartan (COZAAR) 25 MG tablet Take 0.5 tablets (12.5 mg total) by mouth daily. 45 tablet 1   metFORMIN (GLUCOPHAGE) 1000 MG tablet Take 1 tablet (1,000 mg total) by mouth 2 (two) times daily. 180 tablet 1   ondansetron (ZOFRAN) 4 MG tablet Take 1 tablet (4 mg total) by mouth every 8 (eight) hours as needed for nausea or vomiting. 60 tablet 3   Semaglutide (RYBELSUS) 14 MG TABS Take 1 tablet (  14 mg total) by mouth daily. 90 tablet 1   glucose monitoring kit (FREESTYLE) monitoring kit 1 each by Does not apply route as needed for other. 1 each 0   Lancets (FREESTYLE) lancets 1 each by Other route See admin instructions. Use as instructed 100 each 12    Results for orders placed or performed during the hospital encounter of 04/13/22 (from the past 48 hour(s))  Glucose, capillary     Status: Abnormal   Collection Time: 04/13/22 10:35 AM  Result Value Ref Range   Glucose-Capillary 186 (H) 70 - 99 mg/dL    Comment: Glucose reference range applies only to samples taken after  fasting for at least 8 hours.   No results found.  Pertinent items noted in HPI and remainder of comprehensive ROS otherwise negative.  Blood pressure 120/74, pulse 86, temperature 97.9 F (36.6 C), temperature source Oral, resp. rate 18, height _0  (1.499 m), weight 105.7 kg, SpO2 94 %.  Patient is awake and alert.  She is oriented and appropriate.  Speech is fluent.  Judgment insight are intact.  Cranial nerve function normal bilaterally motor examination intact bilateral.  Sensory examination with some mild decrease sensation to C8 bilaterally.  No evidence of long track signs.  Gait and posture are normal.  Examination head ears eyes nose and throat is unremarked.  Chest and abdomen are benign.  Extremities are free from injury or deformity. Assessment/Plan C7-T1 degenerative anterolisthesis with foraminal stenosis and severe neck pain with radiculopathy.  Plan C7-T1 anterior cervical decompression and fusion with interbody cage, local harvested autograft, and anterior plate instrumentation.  Risks and benefits been explained.  Patient wishes to proceed.  Tricia Hill 04/13/2022, 12:14 PM

## 2022-04-13 NOTE — Anesthesia Procedure Notes (Signed)
Procedure Name: Intubation Date/Time: 04/13/2022 12:38 PM  Performed by: Carolan Clines, CRNAPre-anesthesia Checklist: Patient identified, Emergency Drugs available, Suction available and Patient being monitored Patient Re-evaluated:Patient Re-evaluated prior to induction Oxygen Delivery Method: Circle System Utilized Preoxygenation: Pre-oxygenation with 100% oxygen Induction Type: IV induction Ventilation: Mask ventilation without difficulty Laryngoscope Size: Mac and 3 Grade View: Grade I Tube type: Oral Tube size: 7.0 mm Number of attempts: 1 Airway Equipment and Method: Stylet Placement Confirmation: ETT inserted through vocal cords under direct vision, positive ETCO2 and breath sounds checked- equal and bilateral Secured at: 21 cm Tube secured with: Tape Dental Injury: Teeth and Oropharynx as per pre-operative assessment

## 2022-04-13 NOTE — Anesthesia Preprocedure Evaluation (Addendum)
Anesthesia Evaluation  Patient identified by MRN, date of birth, ID band Patient awake    Reviewed: Allergy & Precautions, NPO status , Patient's Chart, lab work & pertinent test results  History of Anesthesia Complications (+) PONV and history of anesthetic complications  Airway Mallampati: III  TM Distance: >3 FB Neck ROM: Full    Dental no notable dental hx. (+) Dental Advisory Given   Pulmonary sleep apnea and Continuous Positive Airway Pressure Ventilation    Pulmonary exam normal        Cardiovascular hypertension, Pt. on medications Normal cardiovascular exam     Neuro/Psych  PSYCHIATRIC DISORDERS Anxiety     negative neurological ROS     GI/Hepatic Neg liver ROS,GERD  ,,  Endo/Other  diabetes    Renal/GU negative Renal ROS     Musculoskeletal negative musculoskeletal ROS (+)    Abdominal   Peds  Hematology negative hematology ROS (+)   Anesthesia Other Findings   Reproductive/Obstetrics                             Anesthesia Physical Anesthesia Plan  ASA: 3  Anesthesia Plan: General   Post-op Pain Management: Tylenol PO (pre-op)*   Induction: Intravenous  PONV Risk Score and Plan: 4 or greater and Ondansetron, Dexamethasone, Midazolam, Scopolamine patch - Pre-op and Propofol infusion  Airway Management Planned: Oral ETT  Additional Equipment:   Intra-op Plan:   Post-operative Plan: Extubation in OR  Informed Consent: I have reviewed the patients History and Physical, chart, labs and discussed the procedure including the risks, benefits and alternatives for the proposed anesthesia with the patient or authorized representative who has indicated his/her understanding and acceptance.     Dental advisory given  Plan Discussed with: Anesthesiologist, CRNA and Surgeon  Anesthesia Plan Comments:        Anesthesia Quick Evaluation

## 2022-04-13 NOTE — Anesthesia Postprocedure Evaluation (Signed)
Anesthesia Post Note  Patient: Tricia Hill  Procedure(s) Performed: Anterior Cervical Discectomy Fusion - Cervical seven-Thoracic one removal of plate (Spine Cervical)     Patient location during evaluation: PACU Anesthesia Type: General Level of consciousness: sedated Pain management: pain level controlled Vital Signs Assessment: post-procedure vital signs reviewed and stable Respiratory status: spontaneous breathing and respiratory function stable Cardiovascular status: stable Postop Assessment: no apparent nausea or vomiting Anesthetic complications: no   No notable events documented.  Last Vitals:  Vitals:   04/13/22 1515 04/13/22 1530  BP: 138/66 126/68  Pulse: 77 72  Resp: (!) 25 (!) 21  Temp:    SpO2: 91% 91%                    [name] DANIEL

## 2022-04-13 NOTE — Op Note (Signed)
Date of procedure: 04/13/2022  Date of dictation: Same  Service: Neurosurgery  Preoperative diagnosis: C7 and T1 degenerative anterolisthesis with stenosis and radiculopathy  Postoperative diagnosis: Same  Procedure Name: C7-T1 anterior cervical fusion utilizing interbody cage, will curbside autograft, and anterior plate instrumentation  Removal of C7 instrumentation  Surgeon:Zailyn Thoennes A.Zakariah Urwin, M.D.  Asst. Surgeon: Reinaldo Meeker, NP  Anesthesia: General  Indication: 51 year old female remotely status post C4-C7 anterior cervical decompression and fusion presents with worsening neck and bilateral upper extremity pain numbness and weakness.  Work-up demonstrates evidence of significant disc degeneration with degenerative anterolisthesis and significant stenosis with cord and nerve root compression at the C7-T1 level.  The patient presents now for decompression and fusion at C7 and T1 in hopes of improving her symptoms.  Operative note: After induction of anesthesia, patient positioned supine with neck slightly extended and held in place with halter traction.  Patient's anterior cervical region prepped and draped sterilely.  Incision made on the left side.  Dissection performed on the left.  Retractor placed.  Fluoroscopy used.  Levels confirmed.  Previously placed anterior cervical plate was dissected free along its inferior aspect.  The C7-T1 disc space was identified.  The plate was then cut using a metal cutting bit separating the plate and screws into C7 from the C6 and above level.  The screws and the plate were removed.  Disc space was then entered at C7 and T1.  Disc base was progressively distracted.  Discectomy was then performed using various instruments down to level the posterior annulus.  The microscope was then brought into the field used throughout the remainder of the discectomy.  Remaining aspects of annulus and osteophytes removed using high-speed drill down to level of the posterior  logical ligament.  Posterior logical is not elevated and resected.  Decompression then performed by undercutting the bodies of C7 and T1.  Decompression then proceeded each neural foramina with wide anterior foraminotomies along the course of the C8 nerve root bilaterally.  At this point a very thorough decompression of been achieved.  There was no evidence of injury to the thecal sac or nerve roots.  The wound was then irrigated.  An 8 mm Medtronic anatomic peek cage was then impacted in the place and recessed slightly from the anterior cortical margin.  25 mm Atlantis anterior cervical plate was then placed to the C7 and T1 level.  This is then attached under fluoroscopic guidance using 13 mm variable angle screws to each of both levels.  All 4 screws given final tightening and found to be solidly within the bone.  Locking screws engaged both levels.  Final images reveal good position of the cage and the hardware at the proper operative level with normal alignment of spine.  Wound is then irrigated 1 final time.  Hemostasis was assured.  Wound is then closed in layers with Vicryl sutures.  Steri-Strips and sterile dressing were applied.  No apparent complications.  Patient tolerated the procedure well and she returns to the recovery room postop.

## 2022-04-13 NOTE — Brief Op Note (Signed)
04/13/2022  2:20 PM  PATIENT:  Tricia Hill  51 y.o. female  PRE-OPERATIVE DIAGNOSIS:  Stenosis  POST-OPERATIVE DIAGNOSIS:  Stenosis  PROCEDURE:  Procedure(s): Anterior Cervical Discectomy Fusion - Cervical seven-Thoracic one removal of plate (N/A)  SURGEON:  Surgeon(s) and Role:    Earnie Larsson, MD - Primary  PHYSICIAN ASSISTANT:   ASSISTANTSMearl Latin   ANESTHESIA:   general  EBL:  50 mL   BLOOD ADMINISTERED:none  DRAINS: none   LOCAL MEDICATIONS USED:  NONE  SPECIMEN:  No Specimen  DISPOSITION OF SPECIMEN:  N/A  COUNTS:  YES  TOURNIQUET:  * No tourniquets in log *  DICTATION: .Dragon Dictation  PLAN OF CARE: Admit for overnight observation  PATIENT DISPOSITION:  PACU - hemodynamically stable.   Delay start of Pharmacological VTE agent (>24hrs) due to surgical blood loss or risk of bleeding: yes

## 2022-04-13 NOTE — Transfer of Care (Signed)
Immediate Anesthesia Transfer of Care Note  Patient: Tricia Hill  Procedure(s) Performed: Anterior Cervical Discectomy Fusion - Cervical seven-Thoracic one removal of plate (Spine Cervical)  Patient Location: PACU  Anesthesia Type:General  Level of Consciousness: drowsy  Airway & Oxygen Therapy: Patient Spontanous Breathing and Patient connected to face mask oxygen  Post-op Assessment: Report given to RN and Post -op Vital signs reviewed and stable  Post vital signs: Reviewed and stable  Last Vitals:  Vitals Value Taken Time  BP 159/90 04/13/22 1441  Temp    Pulse 77 04/13/22 1442  Resp 25 04/13/22 1442  SpO2 94 % 04/13/22 1442  Vitals shown include unvalidated device data.  Last Pain:  Vitals:   04/13/22 1052  TempSrc:   PainSc: 10-Worst pain ever         Complications: No notable events documented.

## 2022-04-14 DIAGNOSIS — J45909 Unspecified asthma, uncomplicated: Secondary | ICD-10-CM | POA: Diagnosis not present

## 2022-04-14 DIAGNOSIS — M4803 Spinal stenosis, cervicothoracic region: Secondary | ICD-10-CM | POA: Diagnosis not present

## 2022-04-14 DIAGNOSIS — Z7984 Long term (current) use of oral hypoglycemic drugs: Secondary | ICD-10-CM | POA: Diagnosis not present

## 2022-04-14 DIAGNOSIS — Z79899 Other long term (current) drug therapy: Secondary | ICD-10-CM | POA: Diagnosis not present

## 2022-04-14 DIAGNOSIS — I10 Essential (primary) hypertension: Secondary | ICD-10-CM | POA: Diagnosis not present

## 2022-04-14 DIAGNOSIS — M4313 Spondylolisthesis, cervicothoracic region: Secondary | ICD-10-CM | POA: Diagnosis not present

## 2022-04-14 DIAGNOSIS — E119 Type 2 diabetes mellitus without complications: Secondary | ICD-10-CM | POA: Diagnosis not present

## 2022-04-14 DIAGNOSIS — M5413 Radiculopathy, cervicothoracic region: Secondary | ICD-10-CM | POA: Diagnosis not present

## 2022-04-14 LAB — GLUCOSE, CAPILLARY: Glucose-Capillary: 210 mg/dL — ABNORMAL HIGH (ref 70–99)

## 2022-04-14 MED ORDER — CYCLOBENZAPRINE HCL 10 MG PO TABS
10.0000 mg | ORAL_TABLET | Freq: Three times a day (TID) | ORAL | 0 refills | Status: DC | PRN
  Filled 2022-04-14: qty 30, 10d supply, fill #0

## 2022-04-14 MED ORDER — HYDROCODONE-ACETAMINOPHEN 5-325 MG PO TABS
1.0000 | ORAL_TABLET | ORAL | 0 refills | Status: DC | PRN
  Filled 2022-04-14: qty 30, 5d supply, fill #0

## 2022-04-14 NOTE — Progress Notes (Signed)
Patient alert and oriented, surgical site clean and dry , patient still c/o weakness to R. Arm MD aware, VSS Doctor jones okay to d/c patient home. D/c instructions explain and given to the patient all questions answered. Patient d/c ome per order.

## 2022-04-14 NOTE — Plan of Care (Signed)

## 2022-04-14 NOTE — Discharge Summary (Signed)
Physician Discharge Summary  Patient ID: Tricia Hill MRN: [id number] DOB/AGE: 1971/05/16 51 y.o.  Admit date: 04/13/2022 Discharge date: 04/14/2022  Admission Diagnoses: C7 and T1 degenerative anterolisthesis with stenosis and radiculopathy     Discharge Diagnoses: same   Discharged Condition: good  Hospital Course: The patient was admitted on 04/13/2022 and taken to the operating room where the patient underwent acdf C7-T1. The patient tolerated the procedure well and was taken to the recovery room and then to the floor in stable condition. The hospital course was routine. There were no complications. The wound remained clean dry and intact. Pt had appropriate neck soreness. No complaints of arm pain or new N/T/W. The patient remained afebrile with stable vital signs, and tolerated a regular diet. The patient continued to increase activities, and pain was well controlled with oral pain medications.   Consults: None  Significant Diagnostic Studies:  Results for orders placed or performed during the hospital encounter of 04/13/22  Glucose, capillary  Result Value Ref Range   Glucose-Capillary 186 (H) 70 - 99 mg/dL  Glucose, capillary  Result Value Ref Range   Glucose-Capillary 166 (H) 70 - 99 mg/dL  Glucose, capillary  Result Value Ref Range   Glucose-Capillary 178 (H) 70 - 99 mg/dL  Hemoglobin A1c  Result Value Ref Range   Hgb A1c MFr Bld 7.7 (H) 4.8 - 5.6 %   Mean Plasma Glucose 174.29 mg/dL  Glucose, capillary  Result Value Ref Range   Glucose-Capillary 240 (H) 70 - 99 mg/dL  Glucose, capillary  Result Value Ref Range   Glucose-Capillary 274 (H) 70 - 99 mg/dL   Comment 1 Notify RN    Comment 2 Document in Chart   Glucose, capillary  Result Value Ref Range   Glucose-Capillary 210 (H) 70 - 99 mg/dL   Comment 1 Notify RN    Comment 2 Document in Chart     DG Cervical Spine 1 View  Result Date: 04/13/2022 CLINICAL DATA:  Elective surgery.  ACDF. EXAM: DG CERVICAL  SPINE - 1 VIEW COMPARISON:  Cervical spine radiographs 10/30/2021 FINDINGS: Images were performed intraoperatively without the presence of a radiologist. Limited visualization of ACDF hardware on single provided image. Total fluoroscopy images: 1 Total fluoroscopy time: 4 seconds Total dose: Radiation Exposure Index (as provided by the fluoroscopic device): 2.04 mGy air Kerma Please see intraoperative findings for further detail. IMPRESSION: Intraoperative fluoroscopy provided during ACDF. Electronically Signed   By: Yvonne Kendall M.D.   On: 04/13/2022 15:21   DG C-Arm 1-60 Min-No Report  Result Date: 04/13/2022 Fluoroscopy was utilized by the requesting physician.  No radiographic interpretation.    Antibiotics:  Anti-infectives (From admission, onward)    Start     Dose/Rate Route Frequency Ordered Stop   04/13/22 2030  ceFAZolin (ANCEF) IVPB 1 g/50 mL premix        1 g 100 mL/hr over 30 Minutes Intravenous Every 8 hours 04/13/22 1618 04/14/22 0519   04/13/22 1100  ceFAZolin (ANCEF) IVPB 2g/100 mL premix        2 g 200 mL/hr over 30 Minutes Intravenous On call to O.R. 04/13/22 1014 04/13/22 1240       Discharge Exam: Blood pressure 137/86, pulse 95, temperature 98.1 F (36.7 C), temperature source Oral, resp. rate 18, height _0  (1.499 m), weight 105.7 kg, SpO2 97 %. Neurologic: Grossly normal Ambulating and voiding well incision cdi   Discharge Medications:   Allergies as of 04/14/2022  Reactions   Lisinopril Cough   Ultram [tramadol Hcl] Nausea And Vomiting   Severe vomiting    Victoza [liraglutide] Nausea And Vomiting        Medication List     TAKE these medications    atorvastatin 40 MG tablet Commonly known as: LIPITOR Take 1 tablet (40 mg total) by mouth once a week. What changed: when to take this   cyclobenzaprine 10 MG tablet Commonly known as: FLEXERIL Take 1 tablet (10 mg total) by mouth 3 (three) times daily as needed for muscle spasms.    escitalopram 5 MG tablet Commonly known as: LEXAPRO Take 1 tablet (5 mg total) by mouth daily.   freestyle lancets 1 each by Other route See admin instructions. Use as instructed   gabapentin 100 MG capsule Commonly known as: NEURONTIN Take 1 capsule (100 mg total) by mouth 3 (three) times daily. What changed:  when to take this reasons to take this   glucose monitoring kit monitoring kit 1 each by Does not apply route as needed for other.   HYDROcodone-acetaminophen 5-325 MG tablet Commonly known as: NORCO/VICODIN Take 1 tablet by mouth every 4 (four) hours as needed for moderate pain ((score 4 to 6)).   losartan 25 MG tablet Commonly known as: Cozaar Take 1/2 tablet (12.5 mg total) by mouth daily. (Take 0.5 tablets (12.5 mg total) by mouth daily.)   metFORMIN 1000 MG tablet Commonly known as: GLUCOPHAGE Take 1 tablet (1,000 mg total) by mouth 2 (two) times daily.   ondansetron 4 MG tablet Commonly known as: Zofran Take 1 tablet (4 mg total) by mouth every 8 (eight) hours as needed for nausea or vomiting.   Rybelsus 14 MG Tabs Generic drug: Semaglutide Take 1 tablet (14 mg total) by mouth daily.        Disposition: home   Final Dx: acdf C7-T1  Discharge Instructions      Remove dressing in 72 hours   Complete by: As directed    Call MD for:  difficulty breathing, headache or visual disturbances   Complete by: As directed    Call MD for:  hives   Complete by: As directed    Call MD for:  persistant dizziness or light-headedness   Complete by: As directed    Call MD for:  persistant nausea and vomiting   Complete by: As directed    Call MD for:  redness, tenderness, or signs of infection (pain, swelling, redness, odor or green/yellow discharge around incision site)   Complete by: As directed    Call MD for:  severe uncontrolled pain   Complete by: As directed    Call MD for:  temperature >100.4   Complete by: As directed    Diet - low sodium heart  healthy   Complete by: As directed    Driving Restrictions   Complete by: As directed    No driving for 2 weeks, no riding in the car for 1 week   Increase activity slowly   Complete by: As directed    Lifting restrictions   Complete by: As directed    No lifting more than 8 lbs          Signed: Ocie Cornfield Cayenne Breault 04/14/2022, 7:18 AM

## 2022-04-14 NOTE — Evaluation (Signed)
Occupational Therapy Evaluation and Discharge Patient Details Name: Tricia Hill MRN: [id number] DOB: 1971/02/13 Today's Date: 04/14/2022   History of Present Illness Tricia Hill is a 51 yo female s/p C7-T1 anterior cervical fusion due to severe posterior cervical pain failing all conservative management including injection management.  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 PHMx:anterior cervical decompression and fusion from C4-C7   Clinical Impression   This 51 yo female underwent above presents to acute OT with all education completed with pt and husband. Follow up therapy for RUE per surgeon on follow up visit. No further acute OT needs, we will sign off.      Recommendations for follow up therapy are one component of a multi-disciplinary discharge planning process, led by the attending physician.  Recommendations may be updated based on patient status, additional functional criteria and insurance authorization.   Follow Up Recommendations  No OT follow up (at this time, possibly post followup surgery visit per surgeons thoughts)       Patient can return home with the following A little help with walking and/or transfers;A little help with bathing/dressing/bathroom;Assistance with cooking/housework;Assistance with feeding;Assist for transportation    Functional Status Assessment  Patient has had a recent decline in their functional status and demonstrates the ability to make significant improvements in function in a reasonable and predictable amount of time. (without further acute OT at this time)  Equipment Recommendations  None recommended by OT       Precautions / Restrictions Precautions Precautions: Cervical Precaution Booklet Issued: Yes (comment) Required Braces or Orthoses: Cervical Brace Cervical Brace: Soft collar;At all times Restrictions Weight Bearing Restrictions: No       Mobility Bed Mobility Overal bed mobility: Needs Assistance Bed Mobility: Rolling, Sidelying to Sit Rolling: Min assist Sidelying to sit: Min assist, HOB elevated            Transfers Overall transfer level: Needs assistance Equipment used: None Transfers: Sit to/from Stand Sit to Stand: Min guard                  Balance Overall balance assessment: Needs assistance Sitting-balance support: No upper extremity supported, Feet supported Sitting balance-Leahy Scale: Good     Standing balance support: No upper extremity supported Standing balance-Leahy Scale: Fair                             ADL either performed or assessed with clinical judgement   ADL Overall ADL's : Needs assistance/impaired Eating/Feeding: Set up;Sitting   Grooming: Minimal assistance Grooming Details (indicate cue type and reason): min guard A sit<>stand Upper Body Bathing: Minimal assistance;Sitting   Lower Body Bathing: Min guard;Sit to/from stand   Upper Body Dressing : Minimal assistance;Sitting Upper Body Dressing Details (indicate cue type and reason): Educated on putting shirt over head first then arms for pullover shirt Lower Body Dressing: Minimal assistance Lower Body Dressing Details (indicate cue type and reason): A on right and side of underwear and pants due to decreased strength in RUE; min guard A sit<>stand Toilet Transfer: Min guard;Ambulation Toilet Transfer Details (indicate cue type and reason): NO AD, simulated by sit<>stand at EOB and moving around the side of the bed while standing up Toileting- Clothing Manipulation and Hygiene: Minimal assistance Toileting - Clothing Manipulation Details (indicate cue type and reason): min guard A sit<>stand (A for pants up_  General ADL Comments: Educated in use of straws for drinking, protecting collar with eating and mouth care, technique for out of bed, care of collar with slip on sleeves (gave her 3 extra  ones), bringing legs up to her for LBD instead of bending foreward.     Vision Patient Visual Report: No change from baseline              Pertinent Vitals/Pain Pain Assessment Pain Assessment: 0-10 Pain Score: 6  Pain Location: mid back Pain Descriptors / Indicators: Aching, Sore, Grimacing, Guarding, Moaning Pain Intervention(s): Limited activity within patient's tolerance, Monitored during session, Repositioned, Patient requesting pain meds-RN notified, RN gave pain meds during session (massage/trigger point at area on back where she was noting pain)     Hand Dominance Right   Extremity/Trunk Assessment Upper Extremity Assessment Upper Extremity Assessment: RUE deficits/detail RUE Deficits / Details: Decreased strength overall, especially for any FM.           Communication Communication Communication: No difficulties   Cognition Arousal/Alertness: Awake/alert Behavior During Therapy: WFL for tasks assessed/performed Overall Cognitive Status: Within Functional Limits for tasks assessed                                          Exercises Other Exercises Other Exercises: For RUE I gave pt handout on theraputty sequencing activity (and yellow theraputty), handout on FM activitites for her to try, and handout on AROM for elbow-wrist-forearm-hand. Told her to ask about follow therapy for her RUE at her first post op appointment.        Home Living Family/patient expects to be discharged to:: Private residence Living Arrangements: Spouse/significant other Available Help at Discharge: Family;Available 24 hours/day Type of Home: House Home Access: Stairs to enter [employer] of Steps: 2 Entrance Stairs-Rails: None Home Layout: One level     Bathroom Shower/Tub: Occupational psychologist: Handicapped height     Home Equipment: Shower seat;Hand held shower head          Prior Functioning/Environment Prior Level of Function :  Needs assist       Physical Assist : ADLs (physical)   ADLs (physical): Dressing;Toileting   ADLs Comments: due to decreased use of RUE and LUE having issues as well        OT Problem List: Decreased strength;Decreased range of motion;Decreased coordination;Pain;Impaired UE functional use         OT Goals(Current goals can be found in the care plan section) Acute Rehab OT Goals Patient Stated Goal: home today and for RUE to get stronger         AM-PAC OT "6 Clicks" Daily Activity     Outcome Measure Help from another person eating meals?: A Little Help from another person taking care of personal grooming?: A Little Help from another person toileting, which includes using toliet, bedpan, or urinal?: A Little Help from another person bathing (including washing, rinsing, drying)?: A Little Help from another person to put on and taking off regular upper body clothing?: A Little Help from another person to put on and taking off regular lower body clothing?: A Little 6 Click Score: 18   End of Session Equipment Utilized During Treatment: Cervical collar Nurse Communication:  (no futher OT needs, family to let RN know when they are ready to leave)  Activity Tolerance: Patient limited by pain (did her best  depsite pain) Patient left: in bed;with call bell/phone within reach;with family/visitor present  OT Visit Diagnosis: Muscle weakness (generalized) (M62.81);Pain Pain - Right/Left: Right Pain - part of body:  (upper mid back to right of spine)                Time: 4417-1278 OT Time Calculation (min): 41 min Charges:  OT General Charges $OT Visit: 1 Visit OT Evaluation $OT Eval Moderate Complexity: 1 Mod OT Treatments $Self Care/Home Management : 23-37 mins  Golden Circle, OTR/L [facility] Re[facility] [phone] Office [phone]    Almon Register 04/14/2022, 11:06 AM

## 2022-04-15 ENCOUNTER — Other Ambulatory Visit: Payer: Self-pay

## 2022-04-16 ENCOUNTER — Other Ambulatory Visit: Payer: Self-pay | Admitting: Family Medicine

## 2022-04-16 ENCOUNTER — Encounter (HOSPITAL_COMMUNITY): Payer: Self-pay | Admitting: Neurosurgery

## 2022-04-16 ENCOUNTER — Other Ambulatory Visit (HOSPITAL_BASED_OUTPATIENT_CLINIC_OR_DEPARTMENT_OTHER): Payer: Self-pay

## 2022-04-16 ENCOUNTER — Other Ambulatory Visit: Payer: Self-pay

## 2022-04-16 MED FILL — Thrombin For Soln 5000 Unit: CUTANEOUS | Qty: 2 | Status: AC

## 2022-04-16 MED FILL — Glucose Blood Test Strip: 30 days supply | Qty: 100 | Fill #0 | Status: AC

## 2022-04-16 MED FILL — Lancets: 30 days supply | Qty: 100 | Fill #0 | Status: AC

## 2022-04-16 NOTE — Telephone Encounter (Signed)
Requested Prescriptions  Pending Prescriptions Disp Refills   Lancets (FREESTYLE) lancets 100 each 0    Sig: USE AS INSTRUCTED     Endocrinology: Diabetes - Testing Supplies Passed - 04/16/2022  7:56 AM      Passed - Valid encounter within last 12 months    Recent Outpatient Visits           1 month ago Type 2 diabetes mellitus with hyperglycemia, without long-term current use of insulin ([place])   [place], Megan P, DO   2 months ago Osteoarthritis of spine with radiculopathy, thoracic region   [facility], Megan P, DO   4 months ago Routine general medical examination at a health care facility   [facility], [place] P, DO   5 months ago Bilateral arm pain   [place], Megan P, DO   7 months ago Type 2 diabetes mellitus with hyperglycemia, without long-term current use of insulin ([place])   [place], [place], DO       Future Appointments             In 1 month Johnson, Megan P, DO [place], [place] test strip [place] [facility] Name: glucose blood test strip] 100 strip 0    Sig: USE AS DIRECTED     Endocrinology: Diabetes - Testing Supplies Passed - 04/16/2022  7:56 AM      Passed - Valid encounter within last 12 months    Recent Outpatient Visits           1 month ago Type 2 diabetes mellitus with hyperglycemia, without long-term current use of insulin ([place])   [place], Megan P, DO   2 months ago Osteoarthritis of spine with radiculopathy, thoracic region   [facility], Megan P, DO   4 months ago Routine general medical examination at a health care facility   [facility], [place] P, DO   5 months ago Bilateral arm pain   [place], Megan P, DO   7 months ago Type 2 diabetes mellitus with hyperglycemia, without long-term current use of insulin  [facility])   [facility], [place], DO       Future Appointments             In 1 month Johnson, Barb Merino, DO [employer]

## 2022-04-20 ENCOUNTER — Other Ambulatory Visit: Payer: Self-pay

## 2022-04-23 ENCOUNTER — Other Ambulatory Visit: Payer: Self-pay

## 2022-04-24 ENCOUNTER — Other Ambulatory Visit: Payer: Self-pay

## 2022-04-25 ENCOUNTER — Other Ambulatory Visit: Payer: Self-pay

## 2022-04-25 MED ORDER — METHYLPREDNISOLONE 4 MG PO TBPK
ORAL_TABLET | ORAL | 0 refills | Status: DC
  Filled 2022-04-25: qty 21, 6d supply, fill #0

## 2022-04-25 MED ORDER — HYDROCODONE-ACETAMINOPHEN 5-325 MG PO TABS
1.0000 | ORAL_TABLET | Freq: Four times a day (QID) | ORAL | 0 refills | Status: DC | PRN
  Filled 2022-04-25: qty 40, 10d supply, fill #0

## 2022-05-09 ENCOUNTER — Other Ambulatory Visit: Payer: Self-pay

## 2022-05-09 MED ORDER — METHYLPREDNISOLONE 4 MG PO TBPK
ORAL_TABLET | ORAL | 0 refills | Status: DC
  Filled 2022-05-09: qty 21, 6d supply, fill #0

## 2022-05-24 DIAGNOSIS — M5412 Radiculopathy, cervical region: Secondary | ICD-10-CM | POA: Diagnosis not present

## 2022-06-13 ENCOUNTER — Encounter: Payer: Self-pay | Admitting: Family Medicine

## 2022-06-13 ENCOUNTER — Ambulatory Visit: Payer: Commercial Managed Care - PPO | Admitting: Family Medicine

## 2022-06-13 ENCOUNTER — Other Ambulatory Visit: Payer: Self-pay

## 2022-06-13 VITALS — BP 126/85 | HR 92 | Temp 98.4°F | Ht 59.0 in | Wt 234.7 lb

## 2022-06-13 DIAGNOSIS — E785 Hyperlipidemia, unspecified: Secondary | ICD-10-CM | POA: Diagnosis not present

## 2022-06-13 DIAGNOSIS — E1165 Type 2 diabetes mellitus with hyperglycemia: Secondary | ICD-10-CM | POA: Diagnosis not present

## 2022-06-13 DIAGNOSIS — I10 Essential (primary) hypertension: Secondary | ICD-10-CM

## 2022-06-13 DIAGNOSIS — F419 Anxiety disorder, unspecified: Secondary | ICD-10-CM | POA: Diagnosis not present

## 2022-06-13 DIAGNOSIS — E1169 Type 2 diabetes mellitus with other specified complication: Secondary | ICD-10-CM | POA: Diagnosis not present

## 2022-06-13 LAB — BAYER DCA HB A1C WAIVED: HB A1C (BAYER DCA - WAIVED): 9.5 % — ABNORMAL HIGH (ref 4.8–5.6)

## 2022-06-13 MED ORDER — LOSARTAN POTASSIUM 25 MG PO TABS
12.5000 mg | ORAL_TABLET | Freq: Every day | ORAL | 1 refills | Status: DC
  Filled 2022-06-13 – 2022-08-07 (×2): qty 45, 90d supply, fill #0
  Filled 2022-11-28: qty 45, 90d supply, fill #1

## 2022-06-13 MED ORDER — ESCITALOPRAM OXALATE 5 MG PO TABS
5.0000 mg | ORAL_TABLET | Freq: Every day | ORAL | 1 refills | Status: DC
  Filled 2022-06-13 – 2022-08-07 (×2): qty 90, 90d supply, fill #0
  Filled 2022-11-28: qty 90, 90d supply, fill #1

## 2022-06-13 MED ORDER — ATORVASTATIN CALCIUM 40 MG PO TABS
40.0000 mg | ORAL_TABLET | ORAL | 0 refills | Status: DC
  Filled 2022-06-13 – 2022-08-07 (×2): qty 12, 84d supply, fill #0
  Filled 2022-11-28: qty 12, 84d supply, fill #1

## 2022-06-13 MED ORDER — TIRZEPATIDE 2.5 MG/0.5ML ~~LOC~~ SOAJ: 2.5000 mg | SUBCUTANEOUS | 0 refills | Status: DC

## 2022-06-13 MED ORDER — METFORMIN HCL 1000 MG PO TABS
1000.0000 mg | ORAL_TABLET | Freq: Two times a day (BID) | ORAL | 1 refills | Status: DC
  Filled 2022-06-13 – 2022-08-07 (×2): qty 180, 90d supply, fill #0
  Filled 2022-11-28: qty 180, 90d supply, fill #1

## 2022-06-13 NOTE — Assessment & Plan Note (Signed)
Under good control on current regimen. Continue current regimen. Continue to monitor. Call with any concerns. Refills given. Labs drawn today.   

## 2022-06-13 NOTE — Assessment & Plan Note (Signed)
Has been off her rybelsus for 2 months A1c elevated at 9.5- will change to [facility] and recheck by phone 1 month.

## 2022-06-13 NOTE — Progress Notes (Signed)
BP 126/85   Pulse 92   Temp 98.4 F (36.9 C) (Oral)   Ht '4\' 11"'$  (1.499 m)   Wt 234 lb 11.2 oz (106.5 kg)   LMP  (LMP Unknown)   SpO2 96%   BMI 47.40 kg/m    Subjective:    Patient ID: Tricia Hill, female    DOB: 11-01-1970, 52 y.o.   MRN: [id number]  HPI: Tricia Hill is a 52 y.o. female  Chief Complaint  Patient presents with   Diabetes   DIABETES Hypoglycemic episodes:no Polydipsia/polyuria: yes Visual disturbance: no Chest pain: no Paresthesias: yes Glucose Monitoring: no Taking Insulin?: no Blood Pressure Monitoring: not checking Retinal Examination: Up to Date Foot Exam: Up to Date Diabetic Education: Completed Pneumovax: Up to Date Influenza: Up to Date Aspirin: no  HYPERTENSION / HYPERLIPIDEMIA Satisfied with current treatment? yes Duration of hypertension: chronic BP monitoring frequency: not checking BP medication side effects: no Past BP meds: losartan Duration of hyperlipidemia: chronic Cholesterol medication side effects: no Cholesterol supplements: none Past cholesterol medications: atorvastatin Medication compliance: excellent compliance Aspirin: no Recent stressors: no Recurrent headaches: no Visual changes: no Palpitations: no Dyspnea: no Chest pain: no Lower extremity edema: no Dizzy/lightheaded: no  ANXIETY/STRESS Duration: chronic Status:controlled Anxious mood: no  Excessive worrying: no Irritability: no  Sweating: no Nausea: no Palpitations:no Hyperventilation: no Panic attacks: no Agoraphobia: no  Obscessions/compulsions: no Depressed mood: no    06/13/2022   10:06 AM 03/09/2022    8:50 AM 12/07/2021    9:26 AM 10/30/2021    3:03 PM 09/05/2021    9:27 AM  Depression screen PHQ 2/9  Decreased Interest 1 0 0 0 0  Down, Depressed, Hopeless 0 0 0 0 0  PHQ - 2 Score 1 0 0 0 0  Altered sleeping '1 2 1 '$ 0 1  Tired, decreased energy '1 1 1 '$ 0 0  Change in appetite 0 1 0 0 0  Feeling bad or failure about yourself  0  0 0 0 1  Trouble concentrating 0 0 0 0 0  Moving slowly or fidgety/restless 0 0 0 0 0  Suicidal thoughts 0 0 0 0 0  PHQ-9 Score '3 4 2 '$ 0 2  Difficult doing work/chores Not difficult at all Somewhat difficult Not difficult at all Not difficult at all    Anhedonia: no Weight changes: no Insomnia: no   Hypersomnia: no Fatigue/loss of energy: no Feelings of worthlessness: no Feelings of guilt: no Impaired concentration/indecisiveness: no Suicidal ideations: no  Crying spells: no Recent Stressors/Life Changes: no   Relationship problems: no   Family stress: no     Financial stress: no    Job stress: no    Recent death/loss: no  Relevant past medical, surgical, family and social history reviewed and updated as indicated. Interim medical history since our last visit reviewed. Allergies and medications reviewed and updated.  Review of Systems  Constitutional: Negative.   Respiratory: Negative.    Cardiovascular: Negative.   Gastrointestinal: Negative.   Musculoskeletal: Negative.   Neurological:  Positive for numbness. Negative for dizziness, tremors, seizures, syncope, facial asymmetry, speech difficulty, weakness, light-headedness and headaches.  Psychiatric/Behavioral: Negative.      Per HPI unless specifically indicated above     Objective:    BP 126/85   Pulse 92   Temp 98.4 F (36.9 C) (Oral)   Ht '4\' 11"'$  (1.499 m)   Wt 234 lb 11.2 oz (106.5 kg)   LMP  (LMP Unknown)  SpO2 96%   BMI 47.40 kg/m   Wt Readings from Last 3 Encounters:  06/13/22 234 lb 11.2 oz (106.5 kg)  04/13/22 233 lb (105.7 kg)  04/04/22 237 lb (107.5 kg)    Physical Exam Vitals and nursing note reviewed.  Constitutional:      General: She is not in acute distress.    Appearance: Normal appearance. She is obese. She is not ill-appearing, toxic-appearing or diaphoretic.  HENT:     Head: Normocephalic and atraumatic.     Right Ear: External ear normal.     Left Ear: External ear normal.      Nose: Nose normal.     Mouth/Throat:     Mouth: Mucous membranes are moist.     Pharynx: Oropharynx is clear.  Eyes:     General: No scleral icterus.       Right eye: No discharge.        Left eye: No discharge.     Extraocular Movements: Extraocular movements intact.     Conjunctiva/sclera: Conjunctivae normal.     Pupils: Pupils are equal, round, and reactive to light.  Cardiovascular:     Rate and Rhythm: Normal rate and regular rhythm.     Pulses: Normal pulses.     Heart sounds: Normal heart sounds. No murmur heard.    No friction rub. No gallop.  Pulmonary:     Effort: Pulmonary effort is normal. No respiratory distress.     Breath sounds: Normal breath sounds. No stridor. No wheezing, rhonchi or rales.  Chest:     Chest wall: No tenderness.  Musculoskeletal:        General: Normal range of motion.     Cervical back: Normal range of motion and neck supple.  Skin:    General: Skin is warm and dry.     Capillary Refill: Capillary refill takes less than 2 seconds.     Coloration: Skin is not jaundiced or pale.     Findings: No bruising, erythema, lesion or rash.  Neurological:     General: No focal deficit present.     Mental Status: She is alert and oriented to person, place, and time. Mental status is at baseline.  Psychiatric:        Mood and Affect: Mood normal.        Behavior: Behavior normal.        Thought Content: Thought content normal.        Judgment: Judgment normal.     Results for orders placed or performed in visit on 06/13/22  Bayer DCA Hb A1c Waived  Result Value Ref Range   HB A1C (BAYER DCA - WAIVED) 9.5 (H) 4.8 - 5.6 %      Assessment & Plan:   Problem List Items Addressed This Visit       Cardiovascular and Mediastinum   HTN (hypertension)    Under good control on current regimen. Continue current regimen. Continue to monitor. Call with any concerns. Refills given. Labs drawn today.        Relevant Medications   atorvastatin (LIPITOR)  40 MG tablet   losartan (COZAAR) 25 MG tablet     Endocrine   Type 2 diabetes mellitus with hyperglycemia ([place]) - Primary    Has been off her rybelsus for 2 months A1c elevated at 9.5- will change to [facility] and recheck by phone 1 month.       Relevant Medications   atorvastatin (LIPITOR) 40 MG tablet   losartan (  COZAAR) 25 MG tablet   metFORMIN (GLUCOPHAGE) 1000 MG tablet   tirzepatide (MOUNJARO) 2.5 MG/0.5ML Pen   Other Relevant Orders   CBC with Differential/Platelet   Bayer DCA Hb A1c Waived (Completed)   Comprehensive metabolic panel   Lipid Panel w/o Chol/HDL Ratio   Hyperlipidemia associated with type 2 diabetes mellitus ([place])    Under good control on current regimen. Continue current regimen. Continue to monitor. Call with any concerns. Refills given. Labs drawn today.       Relevant Medications   atorvastatin (LIPITOR) 40 MG tablet   losartan (COZAAR) 25 MG tablet   metFORMIN (GLUCOPHAGE) 1000 MG tablet   tirzepatide (MOUNJARO) 2.5 MG/0.5ML Pen     Other   Anxiety    Under good control on current regimen. Continue current regimen. Continue to monitor. Call with any concerns. Refills given. Labs drawn today.       Relevant Medications   escitalopram (LEXAPRO) 5 MG tablet     Follow up plan: Return in about 3 months (around 09/12/2022).

## 2022-06-14 LAB — COMPREHENSIVE METABOLIC PANEL
ALT: 40 IU/L — ABNORMAL HIGH (ref 0–32)
AST: 29 IU/L (ref 0–40)
Albumin/Globulin Ratio: 1.7 (ref 1.2–2.2)
Albumin: 4.5 g/dL (ref 3.9–4.9)
Alkaline Phosphatase: 130 IU/L — ABNORMAL HIGH (ref 44–121)
BUN/Creatinine Ratio: 16 (ref 9–23)
BUN: 12 mg/dL (ref 6–24)
Bilirubin Total: 0.4 mg/dL (ref 0.0–1.2)
CO2: 25 mmol/L (ref 20–29)
Calcium: 9.9 mg/dL (ref 8.7–10.2)
Chloride: 96 mmol/L (ref 96–106)
Creatinine, Ser: 0.76 mg/dL (ref 0.57–1.00)
Globulin, Total: 2.7 g/dL (ref 1.5–4.5)
Glucose: 251 mg/dL — ABNORMAL HIGH (ref 70–99)
Potassium: 4.1 mmol/L (ref 3.5–5.2)
Sodium: 137 mmol/L (ref 134–144)
Total Protein: 7.2 g/dL (ref 6.0–8.5)
eGFR: 95 mL/min/{1.73_m2} (ref >59)

## 2022-06-14 LAB — CBC WITH DIFFERENTIAL/PLATELET
Basophils Absolute: 0.1 10*3/uL (ref 0.0–0.2)
Basos: 1 %
EOS (ABSOLUTE): 0.2 10*3/uL (ref 0.0–0.4)
Eos: 1 %
Hematocrit: 42 % (ref 34.0–46.6)
Hemoglobin: 13.8 g/dL (ref 11.1–15.9)
Immature Grans (Abs): 0.1 10*3/uL (ref 0.0–0.1)
Immature Granulocytes: 1 %
Lymphocytes Absolute: 2.7 10*3/uL (ref 0.7–3.1)
Lymphs: 25 %
MCH: 28.8 pg (ref 26.6–33.0)
MCHC: 32.9 g/dL (ref 31.5–35.7)
MCV: 88 fL (ref 79–97)
Monocytes Absolute: 0.6 10*3/uL (ref 0.1–0.9)
Monocytes: 5 %
Neutrophils Absolute: 7.1 10*3/uL — ABNORMAL HIGH (ref 1.4–7.0)
Neutrophils: 67 %
Platelets: 314 10*3/uL (ref 150–450)
RBC: 4.79 x10E6/uL (ref 3.77–5.28)
RDW: 13 % (ref 11.7–15.4)
WBC: 10.7 10*3/uL (ref 3.4–10.8)

## 2022-06-14 LAB — LIPID PANEL W/O CHOL/HDL RATIO
Cholesterol, Total: 285 mg/dL — ABNORMAL HIGH (ref 100–199)
HDL: 50 mg/dL (ref >39)
LDL Chol Calc (NIH): 188 mg/dL — ABNORMAL HIGH (ref 0–99)
Triglycerides: 246 mg/dL — ABNORMAL HIGH (ref 0–149)
VLDL Cholesterol Cal: 47 mg/dL — ABNORMAL HIGH (ref 5–40)

## 2022-07-02 ENCOUNTER — Other Ambulatory Visit: Payer: Self-pay

## 2022-07-02 ENCOUNTER — Emergency Department: Payer: Commercial Managed Care - PPO

## 2022-07-02 ENCOUNTER — Emergency Department
Admission: EM | Admit: 2022-07-02 | Discharge: 2022-07-02 | Disposition: A | Discharge status: Home / Self Care | Payer: Commercial Managed Care - PPO | Attending: Emergency Medicine | Admitting: Emergency Medicine

## 2022-07-02 DIAGNOSIS — N132 Hydronephrosis with renal and ureteral calculous obstruction: Secondary | ICD-10-CM | POA: Insufficient documentation

## 2022-07-02 DIAGNOSIS — R109 Unspecified abdominal pain: Secondary | ICD-10-CM | POA: Diagnosis present

## 2022-07-02 DIAGNOSIS — D72829 Elevated white blood cell count, unspecified: Secondary | ICD-10-CM | POA: Insufficient documentation

## 2022-07-02 DIAGNOSIS — N200 Calculus of kidney: Secondary | ICD-10-CM | POA: Diagnosis not present

## 2022-07-02 DIAGNOSIS — Z9049 Acquired absence of other specified parts of digestive tract: Secondary | ICD-10-CM | POA: Diagnosis not present

## 2022-07-02 DIAGNOSIS — K760 Fatty (change of) liver, not elsewhere classified: Secondary | ICD-10-CM | POA: Diagnosis not present

## 2022-07-02 DIAGNOSIS — M545 Low back pain, unspecified: Secondary | ICD-10-CM | POA: Diagnosis not present

## 2022-07-02 LAB — URINALYSIS, ROUTINE W REFLEX MICROSCOPIC
Bilirubin Urine: NEGATIVE
Glucose, UA: NEGATIVE mg/dL
Ketones, ur: NEGATIVE mg/dL
Leukocytes,Ua: NEGATIVE
Nitrite: NEGATIVE
Protein, ur: 100 mg/dL — AB
Specific Gravity, Urine: 1.019 (ref 1.005–1.030)
pH: 5 (ref 5.0–8.0)

## 2022-07-02 LAB — CBC
HCT: 37.8 % (ref 36.0–46.0)
Hemoglobin: 12.5 g/dL (ref 12.0–15.0)
MCH: 28.6 pg (ref 26.0–34.0)
MCHC: 33.1 g/dL (ref 30.0–36.0)
MCV: 86.5 fL (ref 80.0–100.0)
Platelets: 284 10*3/uL (ref 150–400)
RBC: 4.37 MIL/uL (ref 3.87–5.11)
RDW: 12.7 % (ref 11.5–15.5)
WBC: 10.7 10*3/uL — ABNORMAL HIGH (ref 4.0–10.5)
nRBC: 0 % (ref 0.0–0.2)

## 2022-07-02 LAB — LIPASE, BLOOD: Lipase: 42 U/L (ref 11–51)

## 2022-07-02 LAB — COMPREHENSIVE METABOLIC PANEL
ALT: 28 U/L (ref 0–44)
AST: 24 U/L (ref 15–41)
Albumin: 4 g/dL (ref 3.5–5.0)
Alkaline Phosphatase: 88 U/L (ref 38–126)
Anion gap: 12 (ref 5–15)
BUN: 13 mg/dL (ref 6–20)
CO2: 26 mmol/L (ref 22–32)
Calcium: 9.3 mg/dL (ref 8.9–10.3)
Chloride: 101 mmol/L (ref 98–111)
Creatinine, Ser: 0.85 mg/dL (ref 0.44–1.00)
GFR, Estimated: >60 mL/min (ref >60)
Glucose, Bld: 202 mg/dL — ABNORMAL HIGH (ref 70–99)
Potassium: 3.2 mmol/L — ABNORMAL LOW (ref 3.5–5.1)
Sodium: 139 mmol/L (ref 135–145)
Total Bilirubin: 0.7 mg/dL (ref 0.3–1.2)
Total Protein: 7.6 g/dL (ref 6.5–8.1)

## 2022-07-02 LAB — PREGNANCY, URINE: Preg Test, Ur: NEGATIVE

## 2022-07-02 MED ORDER — IBUPROFEN 600 MG PO TABS
600.0000 mg | ORAL_TABLET | Freq: Three times a day (TID) | ORAL | 0 refills | Status: AC | PRN
  Filled 2022-07-02: qty 20, 7d supply, fill #0

## 2022-07-02 MED ORDER — SODIUM CHLORIDE 0.9 % IV BOLUS
1000.0000 mL | Freq: Once | INTRAVENOUS | Status: AC
  Administered 2022-07-02: 1000 mL via INTRAVENOUS

## 2022-07-02 MED ORDER — ONDANSETRON 4 MG PO TBDP
4.0000 mg | ORAL_TABLET | Freq: Three times a day (TID) | ORAL | 0 refills | Status: DC | PRN
  Filled 2022-07-02: qty 20, 7d supply, fill #0

## 2022-07-02 MED ORDER — HYDROMORPHONE HCL 1 MG/ML IJ SOLN: 0.5000 mg | Freq: Once | INTRAMUSCULAR | Status: DC

## 2022-07-02 MED ORDER — TAMSULOSIN HCL 0.4 MG PO CAPS
0.4000 mg | ORAL_CAPSULE | Freq: Every day | ORAL | 0 refills | Status: AC
  Filled 2022-07-02: qty 7, 7d supply, fill #0

## 2022-07-02 MED ORDER — ONDANSETRON HCL 4 MG/2ML IJ SOLN
4.0000 mg | Freq: Once | INTRAMUSCULAR | Status: AC
  Administered 2022-07-02: 4 mg via INTRAVENOUS
  Filled 2022-07-02: qty 2

## 2022-07-02 MED ORDER — OXYCODONE-ACETAMINOPHEN 5-325 MG PO TABS
1.0000 | ORAL_TABLET | Freq: Four times a day (QID) | ORAL | 0 refills | Status: DC | PRN
  Filled 2022-07-02: qty 20, 3d supply, fill #0

## 2022-07-02 MED ORDER — KETOROLAC TROMETHAMINE 30 MG/ML IJ SOLN
15.0000 mg | Freq: Once | INTRAMUSCULAR | Status: AC
  Administered 2022-07-02: 15 mg via INTRAVENOUS
  Filled 2022-07-02: qty 1

## 2022-07-02 MED ORDER — HYDROCODONE-ACETAMINOPHEN 5-325 MG PO TABS
1.0000 | ORAL_TABLET | Freq: Once | ORAL | Status: AC
  Administered 2022-07-02: 1 via ORAL
  Filled 2022-07-02: qty 1

## 2022-07-02 MED ORDER — HYDROMORPHONE HCL 1 MG/ML IJ SOLN
1.0000 mg | Freq: Once | INTRAMUSCULAR | Status: AC
  Administered 2022-07-02: 1 mg via INTRAVENOUS
  Filled 2022-07-02: qty 1

## 2022-07-02 NOTE — ED Triage Notes (Signed)
Pt to Ed via [place] from home. Pt reports left lower back pain since Saturday. [place] reports gross hematuria and unable to run urine sample. Pt reports feels the urge to urinate frequently with little output. No hx kidney stones or cysts.

## 2022-07-02 NOTE — ED Notes (Signed)
Pt made aware UA needed. Pt requests to receive more fluids and then will try to obtain again

## 2022-07-02 NOTE — ED Notes (Signed)
Pt placed on 2L [place] for desats after receiving dilaudid

## 2022-07-02 NOTE — ED Provider Notes (Signed)
[facility] Provider Note    Event Date/Time   First MD Initiated Contact with Patient 07/02/22 0957     (approximate)   History   No chief complaint on file.   HPI  Tricia Hill is a 52 y.o. female here with severe left flank pain.  The patient states that 2 days ago, she began to have initially mild, aching, left and midline lower back pain.  Over the last 12 hours, the pain has acutely worsened and is now 10 of 10.  She has had exquisite pain.  She feels like she has been urinating more frequently and has had bloody urine today.  No history of kidney stones.  No fevers or chills.  No urinary symptoms preceding the onset of the pain.     Physical Exam   Triage Vital Signs: ED Triage Vitals  Enc Vitals Group     BP 07/02/22 1000 (!) 153/113     Pulse Rate 07/02/22 1000 78     Resp 07/02/22 1000 (!) 22     Temp 07/02/22 1000 98.8 F (37.1 C)     Temp Source 07/02/22 1000 Oral     SpO2 07/02/22 1000 100 %     Weight 07/02/22 0951 233 lb 11 oz (106 kg)     Height 07/02/22 0951 '4\' 11"'$  (1.499 m)     Head Circumference --      Peak Flow --      Pain Score 07/02/22 0951 10     Pain Loc --      Pain Edu? --      Excl. in [place]? --     Most recent vital signs: Vitals:   07/02/22 1430 07/02/22 1440  BP: 102/66   Pulse: (!) 59   Resp: 16   Temp:  98.7 F (37.1 C)  SpO2: 94%      General: Awake, tearful from pain. CV:  Good peripheral perfusion.  Regular rate and rhythm. Resp:  Normal effort.  Abd:  No distention.  Moderate tenderness to palpation over the left flank and left upper and lower quadrants. Other:  Appears uncomfortable   ED Results / Procedures / Treatments   Labs (all labs ordered are listed, but only abnormal results are displayed) Labs Reviewed  COMPREHENSIVE METABOLIC PANEL - Abnormal; Notable for the following components:      Result Value   Potassium 3.2 (*)    Glucose, Bld 202 (*)    All other components within  normal limits  CBC - Abnormal; Notable for the following components:   WBC 10.7 (*)    All other components within normal limits  URINALYSIS, ROUTINE W REFLEX MICROSCOPIC - Abnormal; Notable for the following components:   Color, Urine AMBER (*)    APPearance CLOUDY (*)    Hgb urine dipstick LARGE (*)    Protein, ur 100 (*)    All other components within normal limits  LIPASE, BLOOD  PREGNANCY, URINE     EKG    RADIOLOGY CT stone: 5 mm obstructing calculus in the distal third of the left ureter with proximal left hydroureter nephrosis   I also independently reviewed and agree with radiologist interpretations.   PROCEDURES:  Critical Care performed: No   MEDICATIONS ORDERED IN ED: Medications  sodium chloride 0.9 % bolus 1,000 mL (0 mLs Intravenous Stopped 07/02/22 1141)  ketorolac (TORADOL) 30 MG/ML injection 15 mg (15 mg Intravenous Given 07/02/22 1021)  ondansetron (ZOFRAN) injection 4 mg (4  mg Intravenous Given 07/02/22 1021)  HYDROmorphone (DILAUDID) injection 1 mg (1 mg Intravenous Given 07/02/22 1021)  sodium chloride 0.9 % bolus 1,000 mL (0 mLs Intravenous Stopped 07/02/22 1244)  HYDROcodone-acetaminophen (NORCO/VICODIN) 5-325 MG per tablet 1 tablet (1 tablet Oral Given 07/02/22 1311)     IMPRESSION / MDM / ASSESSMENT AND PLAN / ED COURSE  I reviewed the triage vital signs and the nursing notes.                              Differential diagnosis includes, but is not limited to, kidney stone, ovarian cyst, pyelonephritis, UTI with bladder spasm, diverticulitis, colitis  Patient's presentation is most consistent with acute presentation with potential threat to life or bodily function.  The patient is on the cardiac monitor to evaluate for evidence of arrhythmia and/or significant heart rate changes.  52 year old female here with severe left flank pain.  Patient arrives in obvious distress due to pain.  Mild leukocytosis is noted.  Patient has no fever,  tachycardia, or signs of sepsis.  CT scan shows 5 mm stone in the distal left ureter.  Urinalysis shows hematuria without evidence of infection.  Her pain is well-controlled.  She is tolerating p.o. challenge.  Will discharge with supportive care for kidney stone refer to urology as an outpatient.  FINAL CLINICAL IMPRESSION(S) / ED DIAGNOSES   Final diagnoses:  Nephrolithiasis     Rx / DC Orders   ED Discharge Orders          Ordered    oxyCODONE-acetaminophen (PERCOCET) 5-325 MG tablet  Every 6 hours PRN        07/02/22 1452    tamsulosin (FLOMAX) 0.4 MG CAPS capsule  Daily        07/02/22 1452    ondansetron (ZOFRAN-ODT) 4 MG disintegrating tablet  Every 8 hours PRN        07/02/22 1452    ibuprofen (ADVIL) 600 MG tablet  Every 8 hours PRN        07/02/22 1452             Note:  This document was prepared using Dragon voice recognition software and may include unintentional dictation errors.   Duffy Bruce, MD 07/02/22 1455

## 2022-07-04 ENCOUNTER — Ambulatory Visit: Payer: Commercial Managed Care - PPO | Admitting: Urology

## 2022-07-04 ENCOUNTER — Telehealth: Payer: Self-pay

## 2022-07-04 ENCOUNTER — Encounter: Payer: Self-pay | Admitting: Urology

## 2022-07-04 ENCOUNTER — Ambulatory Visit
Admission: RE | Admit: 2022-07-04 | Discharge: 2022-07-04 | Disposition: A | Discharge status: Home / Self Care | Payer: Commercial Managed Care - PPO | Source: Ambulatory Visit | Attending: Urology | Admitting: Urology

## 2022-07-04 ENCOUNTER — Ambulatory Visit
Admission: RE | Admit: 2022-07-04 | Discharge: 2022-07-04 | Disposition: A | Discharge status: Home / Self Care | Payer: Commercial Managed Care - PPO | Attending: Urology | Admitting: Urology

## 2022-07-04 ENCOUNTER — Other Ambulatory Visit: Payer: Self-pay | Admitting: *Deleted

## 2022-07-04 VITALS — BP 146/81 | HR 85 | Ht 59.0 in | Wt 233.0 lb

## 2022-07-04 DIAGNOSIS — N201 Calculus of ureter: Secondary | ICD-10-CM | POA: Diagnosis not present

## 2022-07-04 DIAGNOSIS — N200 Calculus of kidney: Secondary | ICD-10-CM

## 2022-07-04 DIAGNOSIS — N23 Unspecified renal colic: Secondary | ICD-10-CM

## 2022-07-04 DIAGNOSIS — R109 Unspecified abdominal pain: Secondary | ICD-10-CM | POA: Diagnosis not present

## 2022-07-04 DIAGNOSIS — N132 Hydronephrosis with renal and ureteral calculous obstruction: Secondary | ICD-10-CM

## 2022-07-04 LAB — MICROSCOPIC EXAMINATION: Epithelial Cells (non renal): >10 /hpf — AB (ref 0–10)

## 2022-07-04 LAB — URINALYSIS, COMPLETE
Bilirubin, UA: NEGATIVE
Glucose, UA: NEGATIVE
Ketones, UA: NEGATIVE
Leukocytes,UA: NEGATIVE
Nitrite, UA: NEGATIVE
Specific Gravity, UA: 1.025 (ref 1.005–1.030)
Urobilinogen, Ur: 0.2 mg/dL (ref 0.2–1.0)
pH, UA: 5.5 (ref 5.0–7.5)

## 2022-07-04 NOTE — Telephone Encounter (Signed)
error 

## 2022-07-04 NOTE — Progress Notes (Unsigned)
07/04/2022 6:27 PM   [name] 1970-12-03 [id number]  Referring provider: Valerie Roys, DO [place],  [place] [id number]  Chief Complaint  Patient presents with   Nephrolithiasis    HPI: Tricia Hill is a 52 y.o. female presents in follow-up of recent ED visit for renal colic.  Presented to [facility] ED 07/02/2022 with a 2-day history of left flank pain which became severe necessitating an ED visit.  Severity was rated 10/10 without identifiable precipitating, aggravating or alleviating factors.  No fever/chills Dipstick UA with large blood; Stone protocol CT showed a 5 mm left distal ureteral calculus with mild hydronephrosis/hydroureter Pain was controlled with parenteral analgesics and she was discharged on oral oxycodone, tamsulosin and Zofran Since her ED visit her pain has been much better No documented history of previous stone disease though she had a similar episode in 2016 and it was felt she may have passed a small stone   PMH: Past Medical History:  Diagnosis Date   Asthma    Diabetes mellitus without complication ([place]) 2725   GERD (gastroesophageal reflux disease)    Heart murmur    child   Hypertension    PONV (postoperative nausea and vomiting)    Shingles    Sleep apnea    USE CPAP    Surgical History: Past Surgical History:  Procedure Laterality Date   ANTERIOR CERVICAL DECOMP/DISCECTOMY FUSION N/A 04/13/2022   Procedure: Anterior Cervical Discectomy Fusion - Cervical seven-Thoracic one removal of plate;  Surgeon: Earnie Larsson, MD;  Location: [place];  Service: Neurosurgery;  Laterality: N/A;   CESAREAN SECTION     X 2   CHOLECYSTECTOMY N/A 01/18/2017   Procedure: LAPAROSCOPIC CHOLECYSTECTOMY WITH INTRAOPERATIVE CHOLANGIOGRAM;  Surgeon: Robert Bellow, MD;  Location: [facility];  Service: General;  Laterality: N/A;   COLONOSCOPY WITH PROPOFOL N/A 10/13/2020   Procedure: COLONOSCOPY WITH PROPOFOL;  Surgeon: Lin Landsman, MD;   Location: [facility];  Service: Gastroenterology;  Laterality: N/A;  COVID POSITIVE 09/30/2020   DIAGNOSTIC LAPAROSCOPY  2006   EXCISION OF ABDOMINAL WALL ENDOMETRIOMA   SPINAL FUSION  2012   C3 and C4   TOTAL ABDOMINAL HYSTERECTOMY     Partial    Home Medications:  Allergies as of 07/04/2022       Reactions   Lisinopril Cough   Semaglutide Nausea And Vomiting   Ultram [tramadol Hcl] Nausea And Vomiting   Severe vomiting    Victoza [liraglutide] Nausea And Vomiting        Medication List        Accurate as of July 04, 2022  6:27 PM. If you have any questions, ask your nurse or doctor.          atorvastatin 40 MG tablet Commonly known as: LIPITOR Take 1 tablet (40 mg total) by mouth once a week.   escitalopram 5 MG tablet Commonly known as: LEXAPRO Take 1 tablet (5 mg total) by mouth daily.   freestyle lancets USE AS INSTRUCTED   FREESTYLE LITE test strip Generic drug: glucose blood USE AS DIRECTED   gabapentin 100 MG capsule Commonly known as: NEURONTIN Take 1 capsule (100 mg total) by mouth 3 (three) times daily. What changed:  when to take this reasons to take this   glucose monitoring kit monitoring kit 1 each by Does not apply route as needed for other.   ibuprofen 600 MG tablet Commonly known as: ADVIL Take 1 tablet (600 mg total) by mouth  every 8 (eight) hours as needed for moderate pain.   losartan 25 MG tablet Commonly known as: Cozaar Take 1/2 tablet (12.5 mg total) by mouth daily. (Take 0.5 tablets (12.5 mg total) by mouth daily.)   metFORMIN 1000 MG tablet Commonly known as: GLUCOPHAGE Take 1 tablet (1,000 mg total) by mouth 2 (two) times daily.   ondansetron 4 MG disintegrating tablet Commonly known as: ZOFRAN-ODT Take 1 tablet (4 mg total) by mouth every 8 (eight) hours as needed for nausea or vomiting.   ondansetron 4 MG tablet Commonly known as: Zofran Take 1 tablet (4 mg total) by mouth every 8 (eight) hours as needed  for nausea or vomiting.   oxyCODONE-acetaminophen 5-325 MG tablet Commonly known as: Percocet Take 1-2 tablets by mouth every 6 (six) hours as needed for severe pain or moderate pain. No more than 6 tabs daily   tamsulosin 0.4 MG Caps capsule Commonly known as: FLOMAX Take 1 capsule (0.4 mg total) by mouth daily for 7 days.   tirzepatide 2.5 MG/0.5ML Pen Commonly known as: MOUNJARO Inject 2.5 mg into the skin once a week.        Allergies:  Allergies  Allergen Reactions   Lisinopril Cough   Semaglutide Nausea And Vomiting   Ultram [Tramadol Hcl] Nausea And Vomiting    Severe vomiting    Victoza [Liraglutide] Nausea And Vomiting    Family History: Family History  Problem Relation Age of Onset   Hypertension Mother    Diabetes Mother    Hypertension Father    Berenice Primas' disease Sister    Clotting disorder Brother    Heart disease Maternal Grandmother    Heart disease Maternal Grandfather    Kidney disease Neg Hx    Breast cancer Neg Hx     Social History:  reports that she has never smoked. She has never used smokeless tobacco. She reports that she does not currently use alcohol. She reports that she does not use drugs.   Physical Exam: BP (!) 146/81   Pulse 85   Ht '4\' 11"'$  (1.499 m)   Wt 233 lb (105.7 kg)   LMP  (LMP Unknown)   BMI 47.06 kg/m   Constitutional:  Alert and oriented, No acute distress. HEENT: [place] AT Respiratory: Normal respiratory effort, no increased work of breathing. Psychiatric: Normal mood and affect.  Laboratory Data:  Urinalysis Microscopy 3-10 RBC   Pertinent Imaging: CT images were personally reviewed and interpreted.  Than 2 x 5 mm left distal ureteral calculus with mild   CT Renal Stone Study  Narrative CLINICAL DATA:  53 year old female with history of left lower back pain since Saturday. Gross hematuria.  EXAM: CT ABDOMEN AND PELVIS WITHOUT CONTRAST  TECHNIQUE: Multidetector CT imaging of the abdomen and pelvis was  performed following the standard protocol without IV contrast.  RADIATION DOSE REDUCTION: This exam was performed according to the departmental dose-optimization program which includes automated exposure control, adjustment of the mA and/or kV according to patient size and/or use of iterative reconstruction technique.  COMPARISON:  CT of the abdomen and pelvis 06/03/2015.  FINDINGS: Lower chest: Unremarkable.  Hepatobiliary: Severe diffuse low attenuation throughout the hepatic parenchyma, indicative of a background of hepatic steatosis. No definite suspicious cystic or solid hepatic lesions are confidently identified on today's noncontrast CT examination. Status post cholecystectomy.  Pancreas: No definite pancreatic mass or peripancreatic fluid collections or inflammatory changes are noted on today's noncontrast CT examination.  Spleen: Unremarkable.  Adrenals/Urinary Tract: In the distal  third of the left ureter shortly before the left ureterovesicular junction there is a 5 mm calculus (axial image 76 of series 2) which is associated with mild proximal left hydroureteronephrosis and mild left perinephric soft tissue stranding. No additional calculi are identified within either renal collecting system, along the course of the right ureter, or within the lumen of the urinary bladder. No right hydroureteronephrosis. Unenhanced appearance of the kidneys, bilateral adrenal glands and urinary bladder is otherwise unremarkable.  Stomach/Bowel: Unenhanced appearance of the stomach is normal. There is no pathologic dilatation of small bowel or colon. Normal appendix.  Vascular/Lymphatic: No atherosclerotic calcifications are noted in the abdominal aorta or the pelvic vasculature. Retroaortic left renal vein (normal anatomical variant) incidentally noted. No lymphadenopathy noted in the abdomen or pelvis.  Reproductive: Status post supracervical hysterectomy. Ovaries  are unremarkable in appearance.  Other: No significant volume of ascites.  No pneumoperitoneum.  Musculoskeletal: There are no aggressive appearing lytic or blastic lesions noted in the visualized portions of the skeleton.  IMPRESSION: 1. 5 mm obstructive calculus in the distal third of the left ureter with mild proximal left hydroureteronephrosis. 2. Hepatic steatosis.   Electronically Signed By: Vinnie Langton M.D. On: 07/02/2022 10:59   Assessment & Plan:    1.  Left distal ureteral calculus We discussed various treatment options for urolithiasis including observation with or without medical expulsive therapy, shockwave lithotripsy (SWL), ureteroscopy and laser lithotripsy with stent placement. We discussed that management is based on stone size, location, density, patient co-morbidities, and patient preference.  Stones <16m in size have a >80% spontaneous passage rate. Data surrounding the use of tamsulosin for medical expulsive therapy is controversial, but meta analyses suggests it is most efficacious for distal stones between 5-120min size. SWL has a lower stone free rate in a single procedure, but also a lower complication rate compared to ureteroscopy and avoids a stent and associated stent related symptoms. Possible complications include renal hematoma, steinstrasse, and need for additional treatment. Ureteroscopy with laser lithotripsy and stent placement has a higher stone free rate than SWL in a single procedure, however increased complication rate including possible infection, ureteral injury, bleeding, and stent related morbidity. Common stent related symptoms include dysuria, urgency/frequency, and flank pain After an extensive discussion of the risks and benefits of the above treatment options, the patient would like to proceed with medical expulsion therapy for the next several days. Follow-up KUB next week.  If she has recurrent colic and elects to proceed with  earlier intervention she will contact the office   ScAbbie SonsMDRoslyn26 Railroad RoadSuLibertyuSouth WoodstockNC [phone]

## 2022-07-04 NOTE — Telephone Encounter (Signed)
Per Dr Bernardo Heater, asked if patient can come in to be seen at 76 today to follow up from ER and discuss possible procedure for kidney stone. Patient to get KUB done ahead of appointment. Patient called back and I advised her of this information. Patient states she was not sure if she needed to come since her pain is better, urine less bloody looking and urine flow is better. She is not sure if she passed the stone or not, she did not see anything in the toilet. I spoke with Janett Billow and advised patient to still come in to be seen.

## 2022-07-04 NOTE — H&P (View-Only) (Signed)
07/04/2022 6:27 PM   Tricia Hill 11-May-1971 [id number]  Referring provider: Valerie Roys, DO [place],  [place] [id number]  Chief Complaint  Patient presents with   Nephrolithiasis    HPI: Tricia Hill is a 52 y.o. female presents in follow-up of recent ED visit for renal colic.  Presented to [facility] ED 07/02/2022 with a 2-day history of left flank pain which became severe necessitating an ED visit.  Severity was rated 10/10 without identifiable precipitating, aggravating or alleviating factors.  No fever/chills Dipstick UA with large blood; Stone protocol CT showed a 5 mm left distal ureteral calculus with mild hydronephrosis/hydroureter Pain was controlled with parenteral analgesics and she was discharged on oral oxycodone, tamsulosin and Zofran Since her ED visit her pain has been much better No documented history of previous stone disease though she had a similar episode in 2016 and it was felt she may have passed a small stone   PMH: Past Medical History:  Diagnosis Date   Asthma    Diabetes mellitus without complication ([place]) 4536   GERD (gastroesophageal reflux disease)    Heart murmur    child   Hypertension    PONV (postoperative nausea and vomiting)    Shingles    Sleep apnea    USE CPAP    Surgical History: Past Surgical History:  Procedure Laterality Date   ANTERIOR CERVICAL DECOMP/DISCECTOMY FUSION N/A 04/13/2022   Procedure: Anterior Cervical Discectomy Fusion - Cervical seven-Thoracic one removal of plate;  Surgeon: Earnie Larsson, MD;  Location: [place];  Service: Neurosurgery;  Laterality: N/A;   CESAREAN SECTION     X 2   CHOLECYSTECTOMY N/A 01/18/2017   Procedure: LAPAROSCOPIC CHOLECYSTECTOMY WITH INTRAOPERATIVE CHOLANGIOGRAM;  Surgeon: Robert Bellow, MD;  Location: [facility];  Service: General;  Laterality: N/A;   COLONOSCOPY WITH PROPOFOL N/A 10/13/2020   Procedure: COLONOSCOPY WITH PROPOFOL;  Surgeon: Lin Landsman, MD;   Location: [facility];  Service: Gastroenterology;  Laterality: N/A;  COVID POSITIVE 09/30/2020   DIAGNOSTIC LAPAROSCOPY  2006   EXCISION OF ABDOMINAL WALL ENDOMETRIOMA   SPINAL FUSION  2012   C3 and C4   TOTAL ABDOMINAL HYSTERECTOMY     Partial    Home Medications:  Allergies as of 07/04/2022       Reactions   Lisinopril Cough   Semaglutide Nausea And Vomiting   Ultram [tramadol Hcl] Nausea And Vomiting   Severe vomiting    Victoza [liraglutide] Nausea And Vomiting        Medication List        Accurate as of July 04, 2022  6:27 PM. If you have any questions, ask your nurse or doctor.          atorvastatin 40 MG tablet Commonly known as: LIPITOR Take 1 tablet (40 mg total) by mouth once a week.   escitalopram 5 MG tablet Commonly known as: LEXAPRO Take 1 tablet (5 mg total) by mouth daily.   freestyle lancets USE AS INSTRUCTED   FREESTYLE LITE test strip Generic drug: glucose blood USE AS DIRECTED   gabapentin 100 MG capsule Commonly known as: NEURONTIN Take 1 capsule (100 mg total) by mouth 3 (three) times daily. What changed:  when to take this reasons to take this   glucose monitoring kit monitoring kit 1 each by Does not apply route as needed for other.   ibuprofen 600 MG tablet Commonly known as: ADVIL Take 1 tablet (600 mg total) by mouth  every 8 (eight) hours as needed for moderate pain.   losartan 25 MG tablet Commonly known as: Cozaar Take 1/2 tablet (12.5 mg total) by mouth daily. (Take 0.5 tablets (12.5 mg total) by mouth daily.)   metFORMIN 1000 MG tablet Commonly known as: GLUCOPHAGE Take 1 tablet (1,000 mg total) by mouth 2 (two) times daily.   ondansetron 4 MG disintegrating tablet Commonly known as: ZOFRAN-ODT Take 1 tablet (4 mg total) by mouth every 8 (eight) hours as needed for nausea or vomiting.   ondansetron 4 MG tablet Commonly known as: Zofran Take 1 tablet (4 mg total) by mouth every 8 (eight) hours as needed  for nausea or vomiting.   oxyCODONE-acetaminophen 5-325 MG tablet Commonly known as: Percocet Take 1-2 tablets by mouth every 6 (six) hours as needed for severe pain or moderate pain. No more than 6 tabs daily   tamsulosin 0.4 MG Caps capsule Commonly known as: FLOMAX Take 1 capsule (0.4 mg total) by mouth daily for 7 days.   tirzepatide 2.5 MG/0.5ML Pen Commonly known as: MOUNJARO Inject 2.5 mg into the skin once a week.        Allergies:  Allergies  Allergen Reactions   Lisinopril Cough   Semaglutide Nausea And Vomiting   Ultram [Tramadol Hcl] Nausea And Vomiting    Severe vomiting    Victoza [Liraglutide] Nausea And Vomiting    Family History: Family History  Problem Relation Age of Onset   Hypertension Mother    Diabetes Mother    Hypertension Father    Berenice Primas' disease Sister    Clotting disorder Brother    Heart disease Maternal Grandmother    Heart disease Maternal Grandfather    Kidney disease Neg Hx    Breast cancer Neg Hx     Social History:  reports that she has never smoked. She has never used smokeless tobacco. She reports that she does not currently use alcohol. She reports that she does not use drugs.   Physical Exam: BP (!) 146/81   Pulse 85   Ht '4\' 11"'$  (1.499 m)   Wt 233 lb (105.7 kg)   LMP  (LMP Unknown)   BMI 47.06 kg/m   Constitutional:  Alert and oriented, No acute distress. HEENT: [place] AT Respiratory: Normal respiratory effort, no increased work of breathing. Psychiatric: Normal mood and affect.  Laboratory Data:  Urinalysis Microscopy 3-10 RBC   Pertinent Imaging: CT images were personally reviewed and interpreted.  Than 2 x 5 mm left distal ureteral calculus with mild   CT Renal Stone Study  Narrative CLINICAL DATA:  52 year old female with history of left lower back pain since Saturday. Gross hematuria.  EXAM: CT ABDOMEN AND PELVIS WITHOUT CONTRAST  TECHNIQUE: Multidetector CT imaging of the abdomen and pelvis was  performed following the standard protocol without IV contrast.  RADIATION DOSE REDUCTION: This exam was performed according to the departmental dose-optimization program which includes automated exposure control, adjustment of the mA and/or kV according to patient size and/or use of iterative reconstruction technique.  COMPARISON:  CT of the abdomen and pelvis 06/03/2015.  FINDINGS: Lower chest: Unremarkable.  Hepatobiliary: Severe diffuse low attenuation throughout the hepatic parenchyma, indicative of a background of hepatic steatosis. No definite suspicious cystic or solid hepatic lesions are confidently identified on today's noncontrast CT examination. Status post cholecystectomy.  Pancreas: No definite pancreatic mass or peripancreatic fluid collections or inflammatory changes are noted on today's noncontrast CT examination.  Spleen: Unremarkable.  Adrenals/Urinary Tract: In the distal  third of the left ureter shortly before the left ureterovesicular junction there is a 5 mm calculus (axial image 76 of series 2) which is associated with mild proximal left hydroureteronephrosis and mild left perinephric soft tissue stranding. No additional calculi are identified within either renal collecting system, along the course of the right ureter, or within the lumen of the urinary bladder. No right hydroureteronephrosis. Unenhanced appearance of the kidneys, bilateral adrenal glands and urinary bladder is otherwise unremarkable.  Stomach/Bowel: Unenhanced appearance of the stomach is normal. There is no pathologic dilatation of small bowel or colon. Normal appendix.  Vascular/Lymphatic: No atherosclerotic calcifications are noted in the abdominal aorta or the pelvic vasculature. Retroaortic left renal vein (normal anatomical variant) incidentally noted. No lymphadenopathy noted in the abdomen or pelvis.  Reproductive: Status post supracervical hysterectomy. Ovaries  are unremarkable in appearance.  Other: No significant volume of ascites.  No pneumoperitoneum.  Musculoskeletal: There are no aggressive appearing lytic or blastic lesions noted in the visualized portions of the skeleton.  IMPRESSION: 1. 5 mm obstructive calculus in the distal third of the left ureter with mild proximal left hydroureteronephrosis. 2. Hepatic steatosis.   Electronically Signed By: Vinnie Langton M.D. On: 07/02/2022 10:59   Assessment & Plan:    1.  Left distal ureteral calculus We discussed various treatment options for urolithiasis including observation with or without medical expulsive therapy, shockwave lithotripsy (SWL), ureteroscopy and laser lithotripsy with stent placement. We discussed that management is based on stone size, location, density, patient co-morbidities, and patient preference.  Stones <32m in size have a >80% spontaneous passage rate. Data surrounding the use of tamsulosin for medical expulsive therapy is controversial, but meta analyses suggests it is most efficacious for distal stones between 5-171min size. SWL has a lower stone free rate in a single procedure, but also a lower complication rate compared to ureteroscopy and avoids a stent and associated stent related symptoms. Possible complications include renal hematoma, steinstrasse, and need for additional treatment. Ureteroscopy with laser lithotripsy and stent placement has a higher stone free rate than SWL in a single procedure, however increased complication rate including possible infection, ureteral injury, bleeding, and stent related morbidity. Common stent related symptoms include dysuria, urgency/frequency, and flank pain After an extensive discussion of the risks and benefits of the above treatment options, the patient would like to proceed with medical expulsion therapy for the next several days. Follow-up KUB next week.  If she has recurrent colic and elects to proceed with  earlier intervention she will contact the office   ScAbbie SonsMDCentral Lake[address]SuAgua DulceuStonewall[place] [phone]

## 2022-07-05 ENCOUNTER — Telehealth: Payer: Self-pay | Admitting: *Deleted

## 2022-07-05 ENCOUNTER — Encounter: Payer: Self-pay | Admitting: Family Medicine

## 2022-07-05 ENCOUNTER — Encounter: Payer: Self-pay | Admitting: Urology

## 2022-07-05 ENCOUNTER — Other Ambulatory Visit: Payer: Self-pay

## 2022-07-05 MED ORDER — TIRZEPATIDE 5 MG/0.5ML ~~LOC~~ SOAJ
5.0000 mg | SUBCUTANEOUS | 1 refills | Status: DC
  Filled 2022-07-05: qty 2, 28d supply, fill #0
  Filled 2022-08-07: qty 2, 28d supply, fill #1
  Filled 2022-09-04: qty 2, 28d supply, fill #2
  Filled 2022-09-25 – 2022-09-27 (×2): qty 2, 28d supply, fill #3

## 2022-07-05 NOTE — Telephone Encounter (Signed)
Will you send me orders for this? Thanks.

## 2022-07-05 NOTE — Telephone Encounter (Signed)
Pt calling triage asking if and when she can get litho? She decided she would like it

## 2022-07-06 ENCOUNTER — Other Ambulatory Visit: Payer: Self-pay

## 2022-07-06 ENCOUNTER — Other Ambulatory Visit: Payer: Self-pay | Admitting: Urology

## 2022-07-06 DIAGNOSIS — N201 Calculus of ureter: Secondary | ICD-10-CM

## 2022-07-06 NOTE — Progress Notes (Signed)
[facility] ORDER FORM  Expected date of procedure: 07/12/2022  Surgeon: Hollice Espy, MD  Post op standing: 2-4wk follow up w/KUB prior  Anticoagulation/Aspirin/NSAID standing order: Hold all 72 hours prior  Anesthesia standing order: MAC  VTE standing: SCD's  Dx: Left Ureteral Stone  Procedure: left Extracorporeal shock wave lithotripsy  CPT : 22633  Standing Order Set:   *NPO after mn, KUB  *NS 133m/hr, Keflex 5047mPO, Benadryl 2538mO, Valium 72m24m, Zofran 4mg 38m   Medications if other than standing orders:   NONE

## 2022-07-12 ENCOUNTER — Ambulatory Visit: Payer: Commercial Managed Care - PPO

## 2022-07-12 ENCOUNTER — Encounter: Payer: Self-pay | Admitting: Urology

## 2022-07-12 ENCOUNTER — Encounter
Admission: RE | Disposition: A | Discharge status: Home / Self Care | Payer: Self-pay | Source: Home / Self Care | Attending: Urology

## 2022-07-12 ENCOUNTER — Other Ambulatory Visit: Payer: Self-pay

## 2022-07-12 ENCOUNTER — Ambulatory Visit
Admission: RE | Admit: 2022-07-12 | Discharge: 2022-07-12 | Disposition: A | Discharge status: Home / Self Care | Payer: Commercial Managed Care - PPO | Attending: Urology | Admitting: Urology

## 2022-07-12 DIAGNOSIS — I878 Other specified disorders of veins: Secondary | ICD-10-CM | POA: Diagnosis not present

## 2022-07-12 DIAGNOSIS — M5137 Other intervertebral disc degeneration, lumbosacral region: Secondary | ICD-10-CM | POA: Diagnosis not present

## 2022-07-12 DIAGNOSIS — N201 Calculus of ureter: Secondary | ICD-10-CM

## 2022-07-12 DIAGNOSIS — E119 Type 2 diabetes mellitus without complications: Secondary | ICD-10-CM | POA: Diagnosis not present

## 2022-07-12 DIAGNOSIS — Z6841 Body Mass Index (BMI) 40.0 and over, adult: Secondary | ICD-10-CM | POA: Insufficient documentation

## 2022-07-12 DIAGNOSIS — G4730 Sleep apnea, unspecified: Secondary | ICD-10-CM | POA: Diagnosis not present

## 2022-07-12 DIAGNOSIS — I10 Essential (primary) hypertension: Secondary | ICD-10-CM | POA: Insufficient documentation

## 2022-07-12 DIAGNOSIS — Z538 Procedure and treatment not carried out for other reasons: Secondary | ICD-10-CM | POA: Diagnosis not present

## 2022-07-12 DIAGNOSIS — Z7985 Long-term (current) use of injectable non-insulin antidiabetic drugs: Secondary | ICD-10-CM | POA: Diagnosis not present

## 2022-07-12 DIAGNOSIS — E669 Obesity, unspecified: Secondary | ICD-10-CM | POA: Diagnosis not present

## 2022-07-12 DIAGNOSIS — Z87442 Personal history of urinary calculi: Secondary | ICD-10-CM | POA: Diagnosis not present

## 2022-07-12 HISTORY — PX: EXTRACORPOREAL SHOCK WAVE LITHOTRIPSY: SHX1557

## 2022-07-12 LAB — GLUCOSE, CAPILLARY: Glucose-Capillary: 188 mg/dL — ABNORMAL HIGH (ref 70–99)

## 2022-07-12 SURGERY — LITHOTRIPSY, ESWL
Anesthesia: Moderate Sedation | Laterality: Left

## 2022-07-12 MED ORDER — DIPHENHYDRAMINE HCL 25 MG PO CAPS: 25.0000 mg | ORAL_CAPSULE | ORAL | Status: AC

## 2022-07-12 MED ORDER — ONDANSETRON HCL 4 MG/2ML IJ SOLN: 4.0000 mg | Freq: Once | INTRAMUSCULAR | Status: AC

## 2022-07-12 MED ORDER — SODIUM CHLORIDE 0.9 % IV SOLN: INTRAVENOUS | Status: DC

## 2022-07-12 MED ORDER — DIAZEPAM 5 MG PO TABS
ORAL_TABLET | ORAL | Status: AC
  Filled 2022-07-12: qty 1

## 2022-07-12 MED ORDER — ONDANSETRON HCL 4 MG/2ML IJ SOLN
INTRAMUSCULAR | Status: AC
  Administered 2022-07-12: 4 mg via INTRAVENOUS
  Filled 2022-07-12: qty 2

## 2022-07-12 MED ORDER — CEPHALEXIN 500 MG PO CAPS: 500.0000 mg | ORAL_CAPSULE | Freq: Once | ORAL | Status: AC

## 2022-07-12 MED ORDER — CEPHALEXIN 500 MG PO CAPS
ORAL_CAPSULE | ORAL | Status: AC
  Administered 2022-07-12: 500 mg via ORAL
  Filled 2022-07-12: qty 1

## 2022-07-12 MED ORDER — DIAZEPAM 5 MG PO TABS
10.0000 mg | ORAL_TABLET | ORAL | Status: AC
  Administered 2022-07-12: 10 mg via ORAL

## 2022-07-12 MED ORDER — DIPHENHYDRAMINE HCL 25 MG PO CAPS
ORAL_CAPSULE | ORAL | Status: AC
  Administered 2022-07-12: 25 mg via ORAL
  Filled 2022-07-12: qty 1

## 2022-07-12 NOTE — Discharge Instructions (Signed)

## 2022-07-12 NOTE — Interval H&P Note (Signed)
History and Physical Interval Note:  07/12/2022 11:58 AM  Tricia Hill  has presented today for surgery, with the diagnosis of Left Ureteral Stone.  The various methods of treatment have been discussed with the patient and family. After consideration of risks, benefits and other options for treatment, the patient has consented to  Procedure(s): EXTRACORPOREAL SHOCK WAVE LITHOTRIPSY ([facility]) (Left) as a surgical intervention.  The patient's history has been reviewed, patient examined, no change in status, stable for surgery.  I have reviewed the patient's chart and labs.  Questions were answered to the patient's satisfaction.    KUB today with stone NOT visible.  She is having less pain, 2/10 previously 8/10.  Has not seen stone pass.    On litho truck, stone not seen.  As such, did not proceed.    Hollice Espy

## 2022-07-13 ENCOUNTER — Encounter: Payer: Self-pay | Admitting: Urology

## 2022-07-13 ENCOUNTER — Other Ambulatory Visit: Payer: Self-pay

## 2022-07-13 DIAGNOSIS — N201 Calculus of ureter: Secondary | ICD-10-CM

## 2022-07-16 ENCOUNTER — Encounter: Payer: Self-pay | Admitting: Family Medicine

## 2022-07-31 ENCOUNTER — Ambulatory Visit: Payer: Commercial Managed Care - PPO | Admitting: Physician Assistant

## 2022-08-03 ENCOUNTER — Ambulatory Visit
Admission: RE | Admit: 2022-08-03 | Discharge: 2022-08-03 | Disposition: A | Discharge status: Home / Self Care | Payer: Commercial Managed Care - PPO | Source: Ambulatory Visit | Attending: Urology | Admitting: Urology

## 2022-08-03 ENCOUNTER — Ambulatory Visit: Payer: Commercial Managed Care - PPO | Admitting: Physician Assistant

## 2022-08-03 VITALS — BP 132/77 | HR 106 | Ht 59.0 in | Wt 225.0 lb

## 2022-08-03 DIAGNOSIS — N201 Calculus of ureter: Secondary | ICD-10-CM

## 2022-08-03 DIAGNOSIS — Z87442 Personal history of urinary calculi: Secondary | ICD-10-CM | POA: Diagnosis not present

## 2022-08-03 LAB — URINALYSIS, COMPLETE
Bilirubin, UA: NEGATIVE
Glucose, UA: NEGATIVE
Nitrite, UA: NEGATIVE
RBC, UA: NEGATIVE
Specific Gravity, UA: >1.03 — ABNORMAL HIGH (ref 1.005–1.030)
Urobilinogen, Ur: 0.2 mg/dL (ref 0.2–1.0)
pH, UA: 5.5 (ref 5.0–7.5)

## 2022-08-03 LAB — MICROSCOPIC EXAMINATION: Epithelial Cells (non renal): >10 /hpf — AB (ref 0–10)

## 2022-08-03 NOTE — H&P (View-Only) (Signed)
08/03/2022 9:26 AM   Tricia Hill [birth year] [id number]  CC: Chief Complaint  Patient presents with   Nephrolithiasis    Follow up   HPI: Tricia Hill is a 52 y.o. female who presents today for stone follow-up.  She was scheduled for ESWL with Dr. Erlene Quan on 07/12/2022 for management of a 5 mm distal left ureteral stone, however on the day of the procedure her stone was not visible on KUB and she reported improved pain but had not seen a stone pass.  ESWL was not performed.  Today she reports occasional low low back pain and bladder pressure after voiding, but overall she is rather comfortable.  KUB today with stable-appearing 5 mm distal left ureteral stone.  Only, she is on [place], which she takes on Wednesdays.  She did take it this week.  In-office UA today positive for 3+ protein and trace leukocytes; urine microscopy with 11-30 WBCs/HPF, >10 epithelial cells/hpf, and many bacteria.  PMH: Past Medical History:  Diagnosis Date   Asthma    Diabetes mellitus without complication ([place]) [id number]   GERD (gastroesophageal reflux disease)    Heart murmur    child   Hypertension    PONV (postoperative nausea and vomiting)    Shingles    Sleep apnea    USE CPAP    Surgical History: Past Surgical History:  Procedure Laterality Date   ANTERIOR CERVICAL DECOMP/DISCECTOMY FUSION N/A 04/13/2022   Procedure: Anterior Cervical Discectomy Fusion - Cervical seven-Thoracic one removal of plate;  Surgeon: Earnie Larsson, MD;  Location: [place];  Service: Neurosurgery;  Laterality: N/A;   CESAREAN SECTION     X 2   CHOLECYSTECTOMY N/A 01/18/2017   Procedure: LAPAROSCOPIC CHOLECYSTECTOMY WITH INTRAOPERATIVE CHOLANGIOGRAM;  Surgeon: Robert Bellow, MD;  Location: [facility];  Service: General;  Laterality: N/A;   COLONOSCOPY WITH PROPOFOL N/A 10/13/2020   Procedure: COLONOSCOPY WITH PROPOFOL;  Surgeon: Lin Landsman, MD;  Location: [facility];  Service: Gastroenterology;   Laterality: N/A;  COVID POSITIVE 09/30/2020   DIAGNOSTIC LAPAROSCOPY  2006   EXCISION OF ABDOMINAL WALL ENDOMETRIOMA   EXTRACORPOREAL SHOCK WAVE LITHOTRIPSY Left 07/12/2022   Procedure: EXTRACORPOREAL SHOCK WAVE LITHOTRIPSY (ESWL);  Surgeon: Hollice Espy, MD;  Location: [facility];  Service: Urology;  Laterality: Left;   SPINAL FUSION  2012   C3 and C4   TOTAL ABDOMINAL HYSTERECTOMY     Partial    Home Medications:  Allergies as of 08/03/2022       Reactions   Lisinopril Cough   Semaglutide Nausea And Vomiting   Ultram [tramadol Hcl] Nausea And Vomiting   Severe vomiting    Victoza [liraglutide] Nausea And Vomiting        Medication List        Accurate as of August 03, 2022  9:26 AM. If you have any questions, ask your nurse or doctor.          atorvastatin 40 MG tablet Commonly known as: LIPITOR Take 1 tablet (40 mg total) by mouth once a week.   escitalopram 5 MG tablet Commonly known as: LEXAPRO Take 1 tablet (5 mg total) by mouth daily.   freestyle lancets USE AS INSTRUCTED   FREESTYLE LITE test strip Generic drug: glucose blood USE AS DIRECTED   gabapentin 100 MG capsule Commonly known as: NEURONTIN Take 1 capsule (100 mg total) by mouth 3 (three) times daily. What changed:  when to take this reasons to take this   glucose monitoring  kit monitoring kit 1 each by Does not apply route as needed for other.   ibuprofen 600 MG tablet Commonly known as: ADVIL Take 1 tablet (600 mg total) by mouth every 8 (eight) hours as needed for moderate pain.   losartan 25 MG tablet Commonly known as: Cozaar Take 1/2 tablet (12.5 mg total) by mouth daily. (Take 0.5 tablets (12.5 mg total) by mouth daily.)   metFORMIN 1000 MG tablet Commonly known as: GLUCOPHAGE Take 1 tablet (1,000 mg total) by mouth 2 (two) times daily.   Mounjaro 5 MG/0.5ML Pen Generic drug: tirzepatide Inject 5 mg into the skin once a week.   ondansetron 4 MG disintegrating  tablet Commonly known as: ZOFRAN-ODT Take 1 tablet (4 mg total) by mouth every 8 (eight) hours as needed for nausea or vomiting.   ondansetron 4 MG tablet Commonly known as: Zofran Take 1 tablet (4 mg total) by mouth every 8 (eight) hours as needed for nausea or vomiting.   oxyCODONE-acetaminophen 5-325 MG tablet Commonly known as: Percocet Take 1-2 tablets by mouth every 6 (six) hours as needed for severe pain or moderate pain. No more than 6 tabs daily        Allergies:  Allergies  Allergen Reactions   Lisinopril Cough   Semaglutide Nausea And Vomiting   Ultram [Tramadol Hcl] Nausea And Vomiting    Severe vomiting    Victoza [Liraglutide] Nausea And Vomiting    Family History: Family History  Problem Relation Age of Onset   Hypertension Mother    Diabetes Mother    Hypertension Father    Berenice Primas' disease Sister    Clotting disorder Brother    Heart disease Maternal Grandmother    Heart disease Maternal Grandfather    Kidney disease Neg Hx    Breast cancer Neg Hx     Social History:   reports that she has never smoked. She has never used smokeless tobacco. She reports that she does not currently use alcohol. She reports that she does not use drugs.  Physical Exam: BP 132/77   Pulse (!) 106   Ht '4\' 11"'$  (1.499 m)   Wt 225 lb (102.1 kg)   LMP  (LMP Unknown)   BMI 45.44 kg/m   Constitutional:  Alert and oriented, no acute distress, nontoxic appearing HEENT: [place], AT Cardiovascular: No clubbing, cyanosis, or edema Respiratory: Normal respiratory effort, no increased work of breathing Skin: No rashes, bruises or suspicious lesions Neurologic: Grossly intact, no focal deficits, moving all 4 extremities Psychiatric: Normal mood and affect  Laboratory Data: Results for orders placed or performed in visit on 08/03/22  CULTURE, URINE COMPREHENSIVE   Specimen: Urine   UR  Result Value Ref Range   Urine Culture, Comprehensive Final report    Organism ID, Bacteria  Comment   Microscopic Examination   Urine  Result Value Ref Range   WBC, UA 11-30 (A) 0 - 5 /hpf   RBC, Urine 0-2 0 - 2 /hpf   Epithelial Cells (non renal) >10 (A) 0 - 10 /hpf   Casts Present (A) None seen /lpf   Cast Type Hyaline casts N/A   Bacteria, UA Many (A) None seen/Few  Urinalysis, Complete  Result Value Ref Range   Specific Gravity, UA >1.030 (H) 1.005 - 1.030   pH, UA 5.5 5.0 - 7.5   Color, UA Yellow Yellow   Appearance Ur Cloudy (A) Clear   Leukocytes,UA Trace (A) Negative   Protein,UA 3+ (A) Negative/Trace   Glucose, UA  Negative Negative   Ketones, UA 1+ (A) Negative   RBC, UA Negative Negative   Bilirubin, UA Negative Negative   Urobilinogen, Ur 0.2 0.2 - 1.0 mg/dL   Nitrite, UA Negative Negative   Microscopic Examination See below:    Pertinent Imaging: KUB, 08/03/2022: CLINICAL DATA:  CT demonstrated left ureteral stone.   EXAM: ABDOMEN - 1 VIEW   COMPARISON:  None Available.   FINDINGS: The bowel gas pattern is normal. A few 3-5 mm calcifications overlie the pelvis which could be phleboliths or distal ureteral stones. Distal left ureteral stone observed on CT likely corresponds to one of the several calcifications.   IMPRESSION: Calcifications in the pelvis. Presumably one of these corresponds to the distal left ureteral stone on CT scan.     Electronically Signed   By: Sammie Bench M.D.   On: 08/04/2022 00:06  I personally reviewed the images referenced above and note reappearance of her distal left ureteral stone.  Assessment & Plan:   1. Left ureteral stone She is minimally symptomatic, however stone is apparent on KUB today consistent with retained stone.  We discussed various treatment options for her stone including trial of passage vs. ESWL vs. ureteroscopy with laser lithotripsy and stent.  We specifically discussed that ESWL is a less invasive procedure that requires less anesthesia, however patients have to pass their residual  stone fragments, which may take several weeks and be associated with continued renal colic.  In her case, there is a risk that stone may not be seen on KUB on the day of her rescheduled ESWL, which would further delay treatment.  Additionally, we discussed the limitations of ESWL including the low, 10-20% chance of treatment failure requiring repeat ESWL versus ureteroscopy in the future. By comparison, ureteroscopy is a more invasive surgical procedure that requires more anesthesia, but it does require placement of a ureteral stent, which will remain in place for approximately 3-10 days and can be associated with flank pain, bladder pain, dysuria, urgency, frequency, urinary leakage, and gross hematuria.  Based on this conversation, she would like to proceed with ureteroscopy.  Will obtain preop UA and culture today and place OR orders for left URS/LL/stent with Dr. Bernardo Heater. - Urinalysis, Complete - CULTURE, URINE COMPREHENSIVE - Microscopic Examination   Return for Will call to schedule surgery.  Debroah Loop, PA-C  [facility] [address] [place], [place] [address] [phone]

## 2022-08-03 NOTE — Progress Notes (Signed)
08/03/2022 9:26 AM   Tricia Hill [birth year] [id number]  CC: Chief Complaint  Patient presents with   Nephrolithiasis    Follow up   HPI: Tricia Hill is a 52 y.o. female who presents today for stone follow-up.  She was scheduled for ESWL with Dr. Erlene Quan on 07/12/2022 for management of a 5 mm distal left ureteral stone, however on the day of the procedure her stone was not visible on KUB and she reported improved pain but had not seen a stone pass.  ESWL was not performed.  Today she reports occasional low low back pain and bladder pressure after voiding, but overall she is rather comfortable.  KUB today with stable-appearing 5 mm distal left ureteral stone.  Only, she is on [place], which she takes on Wednesdays.  She did take it this week.  In-office UA today positive for 3+ protein and trace leukocytes; urine microscopy with 11-30 WBCs/HPF, >10 epithelial cells/hpf, and many bacteria.  PMH: Past Medical History:  Diagnosis Date   Asthma    Diabetes mellitus without complication ([place]) [id number]   GERD (gastroesophageal reflux disease)    Heart murmur    child   Hypertension    PONV (postoperative nausea and vomiting)    Shingles    Sleep apnea    USE CPAP    Surgical History: Past Surgical History:  Procedure Laterality Date   ANTERIOR CERVICAL DECOMP/DISCECTOMY FUSION N/A 04/13/2022   Procedure: Anterior Cervical Discectomy Fusion - Cervical seven-Thoracic one removal of plate;  Surgeon: Earnie Larsson, MD;  Location: [place];  Service: Neurosurgery;  Laterality: N/A;   CESAREAN SECTION     X 2   CHOLECYSTECTOMY N/A 01/18/2017   Procedure: LAPAROSCOPIC CHOLECYSTECTOMY WITH INTRAOPERATIVE CHOLANGIOGRAM;  Surgeon: Robert Bellow, MD;  Location: [facility];  Service: General;  Laterality: N/A;   COLONOSCOPY WITH PROPOFOL N/A 10/13/2020   Procedure: COLONOSCOPY WITH PROPOFOL;  Surgeon: Lin Landsman, MD;  Location: [facility];  Service: Gastroenterology;   Laterality: N/A;  COVID POSITIVE 09/30/2020   DIAGNOSTIC LAPAROSCOPY  2006   EXCISION OF ABDOMINAL WALL ENDOMETRIOMA   EXTRACORPOREAL SHOCK WAVE LITHOTRIPSY Left 07/12/2022   Procedure: EXTRACORPOREAL SHOCK WAVE LITHOTRIPSY (ESWL);  Surgeon: Hollice Espy, MD;  Location: [facility];  Service: Urology;  Laterality: Left;   SPINAL FUSION  2012   C3 and C4   TOTAL ABDOMINAL HYSTERECTOMY     Partial    Home Medications:  Allergies as of 08/03/2022       Reactions   Lisinopril Cough   Semaglutide Nausea And Vomiting   Ultram [tramadol Hcl] Nausea And Vomiting   Severe vomiting    Victoza [liraglutide] Nausea And Vomiting        Medication List        Accurate as of August 03, 2022  9:26 AM. If you have any questions, ask your nurse or doctor.          atorvastatin 40 MG tablet Commonly known as: LIPITOR Take 1 tablet (40 mg total) by mouth once a week.   escitalopram 5 MG tablet Commonly known as: LEXAPRO Take 1 tablet (5 mg total) by mouth daily.   freestyle lancets USE AS INSTRUCTED   FREESTYLE LITE test strip Generic drug: glucose blood USE AS DIRECTED   gabapentin 100 MG capsule Commonly known as: NEURONTIN Take 1 capsule (100 mg total) by mouth 3 (three) times daily. What changed:  when to take this reasons to take this   glucose monitoring  kit monitoring kit 1 each by Does not apply route as needed for other.   ibuprofen 600 MG tablet Commonly known as: ADVIL Take 1 tablet (600 mg total) by mouth every 8 (eight) hours as needed for moderate pain.   losartan 25 MG tablet Commonly known as: Cozaar Take 1/2 tablet (12.5 mg total) by mouth daily. (Take 0.5 tablets (12.5 mg total) by mouth daily.)   metFORMIN 1000 MG tablet Commonly known as: GLUCOPHAGE Take 1 tablet (1,000 mg total) by mouth 2 (two) times daily.   Mounjaro 5 MG/0.5ML Pen Generic drug: tirzepatide Inject 5 mg into the skin once a week.   ondansetron 4 MG disintegrating  tablet Commonly known as: ZOFRAN-ODT Take 1 tablet (4 mg total) by mouth every 8 (eight) hours as needed for nausea or vomiting.   ondansetron 4 MG tablet Commonly known as: Zofran Take 1 tablet (4 mg total) by mouth every 8 (eight) hours as needed for nausea or vomiting.   oxyCODONE-acetaminophen 5-325 MG tablet Commonly known as: Percocet Take 1-2 tablets by mouth every 6 (six) hours as needed for severe pain or moderate pain. No more than 6 tabs daily        Allergies:  Allergies  Allergen Reactions   Lisinopril Cough   Semaglutide Nausea And Vomiting   Ultram [Tramadol Hcl] Nausea And Vomiting    Severe vomiting    Victoza [Liraglutide] Nausea And Vomiting    Family History: Family History  Problem Relation Age of Onset   Hypertension Mother    Diabetes Mother    Hypertension Father    Berenice Primas' disease Sister    Clotting disorder Brother    Heart disease Maternal Grandmother    Heart disease Maternal Grandfather    Kidney disease Neg Hx    Breast cancer Neg Hx     Social History:   reports that she has never smoked. She has never used smokeless tobacco. She reports that she does not currently use alcohol. She reports that she does not use drugs.  Physical Exam: BP 132/77   Pulse (!) 106   Ht '4\' 11"'$  (1.499 m)   Wt 225 lb (102.1 kg)   LMP  (LMP Unknown)   BMI 45.44 kg/m   Constitutional:  Alert and oriented, no acute distress, nontoxic appearing HEENT: [place], AT Cardiovascular: No clubbing, cyanosis, or edema Respiratory: Normal respiratory effort, no increased work of breathing Skin: No rashes, bruises or suspicious lesions Neurologic: Grossly intact, no focal deficits, moving all 4 extremities Psychiatric: Normal mood and affect  Laboratory Data: Results for orders placed or performed in visit on 08/03/22  CULTURE, URINE COMPREHENSIVE   Specimen: Urine   UR  Result Value Ref Range   Urine Culture, Comprehensive Final report    Organism ID, Bacteria  Comment   Microscopic Examination   Urine  Result Value Ref Range   WBC, UA 11-30 (A) 0 - 5 /hpf   RBC, Urine 0-2 0 - 2 /hpf   Epithelial Cells (non renal) >10 (A) 0 - 10 /hpf   Casts Present (A) None seen /lpf   Cast Type Hyaline casts N/A   Bacteria, UA Many (A) None seen/Few  Urinalysis, Complete  Result Value Ref Range   Specific Gravity, UA >1.030 (H) 1.005 - 1.030   pH, UA 5.5 5.0 - 7.5   Color, UA Yellow Yellow   Appearance Ur Cloudy (A) Clear   Leukocytes,UA Trace (A) Negative   Protein,UA 3+ (A) Negative/Trace   Glucose, UA  Negative Negative   Ketones, UA 1+ (A) Negative   RBC, UA Negative Negative   Bilirubin, UA Negative Negative   Urobilinogen, Ur 0.2 0.2 - 1.0 mg/dL   Nitrite, UA Negative Negative   Microscopic Examination See below:    Pertinent Imaging: KUB, 08/03/2022: CLINICAL DATA:  CT demonstrated left ureteral stone.   EXAM: ABDOMEN - 1 VIEW   COMPARISON:  None Available.   FINDINGS: The bowel gas pattern is normal. A few 3-5 mm calcifications overlie the pelvis which could be phleboliths or distal ureteral stones. Distal left ureteral stone observed on CT likely corresponds to one of the several calcifications.   IMPRESSION: Calcifications in the pelvis. Presumably one of these corresponds to the distal left ureteral stone on CT scan.     Electronically Signed   By: Sammie Bench M.D.   On: 08/04/2022 00:06  I personally reviewed the images referenced above and note reappearance of her distal left ureteral stone.  Assessment & Plan:   1. Left ureteral stone She is minimally symptomatic, however stone is apparent on KUB today consistent with retained stone.  We discussed various treatment options for her stone including trial of passage vs. ESWL vs. ureteroscopy with laser lithotripsy and stent.  We specifically discussed that ESWL is a less invasive procedure that requires less anesthesia, however patients have to pass their residual  stone fragments, which may take several weeks and be associated with continued renal colic.  In her case, there is a risk that stone may not be seen on KUB on the day of her rescheduled ESWL, which would further delay treatment.  Additionally, we discussed the limitations of ESWL including the low, 10-20% chance of treatment failure requiring repeat ESWL versus ureteroscopy in the future. By comparison, ureteroscopy is a more invasive surgical procedure that requires more anesthesia, but it does require placement of a ureteral stent, which will remain in place for approximately 3-10 days and can be associated with flank pain, bladder pain, dysuria, urgency, frequency, urinary leakage, and gross hematuria.  Based on this conversation, she would like to proceed with ureteroscopy.  Will obtain preop UA and culture today and place OR orders for left URS/LL/stent with Dr. Bernardo Heater. - Urinalysis, Complete - CULTURE, URINE COMPREHENSIVE - Microscopic Examination   Return for Will call to schedule surgery.  Debroah Loop, PA-C  [facility] [address] [place], [place] [address] [phone]

## 2022-08-03 NOTE — Patient Instructions (Signed)
Our surgical scheduler, Lenna Sciara, will call you next week to get you scheduled for a left ureteroscopy to treat your kidney stone. In the meantime, please do the following: -Resume Flomax 0.'4mg'$  daily -Stay well hydrated -Strain your urine to catch any stones that pass -Treat any pain with ibuprofen/tylenol or Percocet -Treat any nausea with Zofran  Please call our office immediately (we are open 8a-5p Monday-Friday) or go to the Emergency Department if you develop any of the following: -Fever -Chills -Nausea and/or vomiting uncontrollable with Zofran -Pain uncontrollable with Percocet

## 2022-08-07 ENCOUNTER — Other Ambulatory Visit: Payer: Self-pay | Admitting: Physician Assistant

## 2022-08-07 DIAGNOSIS — N201 Calculus of ureter: Secondary | ICD-10-CM

## 2022-08-07 LAB — CULTURE, URINE COMPREHENSIVE

## 2022-08-07 NOTE — Progress Notes (Unsigned)
Surgical Physician Order Form [facility] [place]  Dr. Bernardo Heater * Scheduling expectation : Next Available  *Length of Case:   *Clearance needed: no  *Anticoagulation Instructions: N/A  *Aspirin Instructions: N/A  *Post-op visit Date/Instructions:   TBD  *Diagnosis: Left Ureteral Stone  *Procedure: left Ureteroscopy w/laser lithotripsy & stent placement [id number])   Additional orders: N/A  -Admit type: OUTpatient  -Anesthesia: General  -VTE Prophylaxis Standing Order SCD's       Other:   -Standing Lab Orders Per Anesthesia    Lab other: None  -Standing Test orders EKG/Chest x-ray per Anesthesia       Test other:   - Medications:  Ancef 2gm IV  -Other orders:  N/A  NOTE: ON MOUNJARO, DOSED ON WEDNESDAYS

## 2022-08-08 ENCOUNTER — Other Ambulatory Visit: Payer: Self-pay

## 2022-08-08 ENCOUNTER — Telehealth: Payer: Self-pay

## 2022-08-08 NOTE — Progress Notes (Signed)
   [facility] From  Surgery Date: Date: 08/21/2022  Surgeon: Dr. [name], MD  Inpt ( No  )   Outpt (Yes)   Obs ( No  )   Diagnosis: N20.1 Left Ureteral Stone  -CPT: [phone]  Surgery: Left Ureteroscopy with Laser Lithotripsy and Stent Placement   Stop Anticoagulations: N/A  Cardiac/Medical/Pulmonary Clearance needed: no  *Orders entered into EPIC  Date: 08/08/22   *Case booked in [place]  Date: 08/08/22  *Notified pt of Surgery: Date: 08/08/22  PRE-OP UA & CX: no  *Placed into Prior Authorization Work Que Date: 08/08/22  Assistant/laser/rep:No   Patient aware to hold [facility] for 1 week prior to surgery.

## 2022-08-08 NOTE — Telephone Encounter (Signed)
I spoke with Tricia Hill. We have discussed possible surgery dates and Tuesday March 12th, 2024 was agreed upon by all parties. Patient given information about surgery date, what to expect pre-operatively and post operatively.  We discussed that a Pre-Admission Testing office will be calling to set up the pre-op visit that will take place prior to surgery, and that these appointments are typically done over the phone with a Pre-Admissions RN. Informed patient that our office will communicate any additional care to be provided after surgery. Patients questions or concerns were discussed during our call. Advised to call our office should there be any additional information, questions or concerns that arise. Patient verbalized understanding.

## 2022-08-14 ENCOUNTER — Inpatient Hospital Stay
Admission: RE | Admit: 2022-08-14 | Discharge: 2022-08-14 | Disposition: A | Discharge status: Home / Self Care | Payer: Commercial Managed Care - PPO | Source: Ambulatory Visit

## 2022-08-14 NOTE — Pre-Procedure Instructions (Signed)
4 attempts to contact patient for pre admit interview. VM left. Will reschedule appt.

## 2022-08-14 NOTE — Patient Instructions (Signed)
Your procedure is scheduled on: Tuesday 08/21/22 To find out your arrival time, please call [phone] between 1PM - 3PM on:   Monday 08/20/22 Report to the Registration Desk on the 1st floor of the [facility]. Valet parking is available.  If your arrival time is 6:00 am, do not arrive before that time as the [facility] entrance doors do not open until 6:00 am.  REMEMBER: Instructions that are not followed completely may result in serious medical risk, up to and including death; or upon the discretion of your surgeon and anesthesiologist your surgery may need to be rescheduled.  Do not eat food or drink any liquids after midnight the night before surgery.  No gum chewing or hard candies.  One week prior to surgery: Stop Anti-inflammatories (NSAIDS) such as Advil, Aleve, Ibuprofen, Motrin, Naproxen, Naprosyn and Aspirin based products such as Excedrin, Goody's Powder, BC Powder. You may however, continue to take Tylenol if needed for pain up until the day of surgery.  Stop ANY OVER THE COUNTER supplements until after surgery.  Continue taking all prescribed medications with the exception of the following: Mounjaro needs to be held for 7 days before surgery. Metformin needs to be held 2 days before surgery, last dose will be Sat 08/18/22  **Follow guidelines for insulin and diabetes medications**  Follow recommendations from Cardiologist or PCP regarding stopping blood thinners.  TAKE ONLY THESE MEDICATIONS THE MORNING OF SURGERY WITH A SIP OF WATER:    Antacid (take one the night before and one on the morning of surgery - helps to prevent nausea after surgery.)  Use inhalers on the day of surgery and bring to the hospital.  No Alcohol for 24 hours before or after surgery.  No Smoking including e-cigarettes for 24 hours before surgery.  No chewable tobacco products for at least 6 hours before surgery.  No nicotine patches on the day of surgery.  Do not use any "recreational"  drugs for at least a week (preferably 2 weeks) before your surgery.  Please be advised that the combination of cocaine and anesthesia may have negative outcomes, up to and including death. If you test positive for cocaine, your surgery will be cancelled.  On the morning of surgery brush your teeth with toothpaste and water, you may rinse your mouth with mouthwash if you wish. Do not swallow any toothpaste or mouthwash.  Use CHG Soap or wipes as directed on instruction sheet. Shower the morning of your procedure using your regular soap.  Do not wear lotions, powders, or perfumes. You may wear deodorant.  Do not shave body hair from the neck down 48 hours before surgery.  Wear comfortable clothing (specific to your surgery type) to the hospital.  Do not wear jewelry, make-up, hairpins, clips or nail polish.  Contact lenses, hearing aids and dentures may not be worn into surgery.  Bring your C-PAP to the hospital in case you may have to spend the night.   Do not bring valuables to the hospital. [facility] is not responsible for any missing/lost belongings or valuables.   Notify your doctor if there is any change in your medical condition (cold, fever, infection).  If you are being discharged the day of surgery, you will not be allowed to drive home. You will need a responsible individual to drive you home and stay with you for 24 hours after surgery.   If you are taking public transportation, you will need to have a responsible individual with you.  If you are being admitted to the hospital overnight, leave your suitcase in the car. After surgery it may be brought to your room.  In case of increased patient census, it may be necessary for you, the patient, to continue your postoperative care in the Same Day Surgery department.  After surgery, you can help prevent lung complications by doing breathing exercises.  Take deep breaths and cough every 1-2 hours. Your doctor may order a  device called an Incentive Spirometer to help you take deep breaths. When coughing or sneezing, hold a pillow firmly against your incision with both hands. This is called "splinting." Doing this helps protect your incision. It also decreases belly discomfort.  Surgery Visitation Policy:  Patients undergoing a surgery or procedure may have two family members or support persons with them as long as the person is not COVID-19 positive or experiencing its symptoms.   Inpatient Visitation:    Visiting hours are 7 a.m. to 8 p.m. Up to four visitors are allowed at one time in a patient room. The visitors may rotate out with other people during the day. One designated support person (adult) may remain overnight.  Due to an increase in RSV and influenza rates and associated hospitalizations, children ages 43 and under will not be able to visit patients in [facility]. Masks continue to be strongly recommended.  Please call the [facility] Dept. at [phone] if you have any questions about these instructions.

## 2022-08-15 ENCOUNTER — Other Ambulatory Visit: Payer: Self-pay

## 2022-08-15 ENCOUNTER — Encounter
Admission: RE | Admit: 2022-08-15 | Discharge: 2022-08-15 | Disposition: A | Discharge status: Home / Self Care | Payer: Commercial Managed Care - PPO | Source: Ambulatory Visit | Attending: Urology | Admitting: Urology

## 2022-08-15 DIAGNOSIS — E1165 Type 2 diabetes mellitus with hyperglycemia: Secondary | ICD-10-CM

## 2022-08-15 DIAGNOSIS — Z01812 Encounter for preprocedural laboratory examination: Secondary | ICD-10-CM

## 2022-08-15 HISTORY — DX: Anxiety disorder, unspecified: F41.9

## 2022-08-15 HISTORY — DX: Myoneural disorder, unspecified: G70.9

## 2022-08-15 HISTORY — DX: Unspecified osteoarthritis, unspecified site: M19.90

## 2022-08-15 HISTORY — DX: Personal history of urinary calculi: Z87.442

## 2022-08-15 NOTE — Patient Instructions (Addendum)
Your procedure is scheduled on: 08/21/22 - Tuesday Report to the Registration Desk on the 1st floor of the [facility]. To find out your arrival time, please call [phone] between 1PM - 3PM on: Monday - 08/20/22. If your arrival time is 6:00 am, do not arrive before that time as the [facility] entrance doors do not open until 6:00 am.  REMEMBER: Instructions that are not followed completely may result in serious medical risk, up to and including death; or upon the discretion of your surgeon and anesthesiologist your surgery may need to be rescheduled.  Do not eat food or drink any liquids after midnight the night before surgery.  No gum chewing or hard candies.   One week prior to surgery: Stop taking beginning 08/15/22, any Anti-inflammatories (NSAIDS) such as Advil, Aleve, Ibuprofen, Motrin, Naproxen, Naprosyn and Aspirin based products such as Excedrin, Goody's Powder, BC Powder.  Stop ANY OVER THE COUNTER supplements until after surgery.  You may take Tylenol if needed for pain up until the day of surgery.  Continue taking all prescribed medications with the exception of the following:  tirzepatide (MOUNJARO) 5 MG/0.5ML Pen hold 7 days before your procedure. metFORMIN (GLUCOPHAGE) hold beginning 08/19/22.   TAKE ONLY THESE MEDICATIONS THE MORNING OF SURGERY WITH A SIP OF WATER:  oxyCODONE-acetaminophen (PERCOCET) if needed.  No Alcohol for 24 hours before or after surgery.  No Smoking including e-cigarettes for 24 hours before surgery.  No chewable tobacco products for at least 6 hours before surgery.  No nicotine patches on the day of surgery.  Do not use any "recreational" drugs for at least a week (preferably 2 weeks) before your surgery.  Please be advised that the combination of cocaine and anesthesia may have negative outcomes, up to and including death. If you test positive for cocaine, your surgery will be cancelled.  On the morning of surgery brush your  teeth with toothpaste and water, you may rinse your mouth with mouthwash if you wish. Do not swallow any toothpaste or mouthwash.  Do not wear jewelry, make-up, hairpins, clips or nail polish.  Do not wear lotions, powders, or perfumes.   Do not shave body hair from the neck down 48 hours before surgery.  Contact lenses, hearing aids and dentures may not be worn into surgery.  Do not bring valuables to the hospital. [facility] is not responsible for any missing/lost belongings or valuables.   Bring your C-PAP to the hospital in case you may have to spend the night.   Notify your doctor if there is any change in your medical condition (cold, fever, infection).  Wear comfortable clothing (specific to your surgery type) to the hospital.  After surgery, you can help prevent lung complications by doing breathing exercises.  Take deep breaths and cough every 1-2 hours. Your doctor may order a device called an Incentive Spirometer to help you take deep breaths. When coughing or sneezing, hold a pillow firmly against your incision with both hands. This is called "splinting." Doing this helps protect your incision. It also decreases belly discomfort.  If you are being admitted to the hospital overnight, leave your suitcase in the car. After surgery it may be brought to your room.  In case of increased patient census, it may be necessary for you, the patient, to continue your postoperative care in the Same Day Surgery department.  If you are being discharged the day of surgery, you will not be allowed to drive home. You will need  a responsible individual to drive you home and stay with you for 24 hours after surgery.   If you are taking public transportation, you will need to have a responsible individual with you.  Please call the [facility] Dept. at [phone] if you have any questions about these instructions.  Surgery Visitation Policy:  Patients undergoing a surgery or  procedure may have two family members or support persons with them as long as the person is not COVID-19 positive or experiencing its symptoms.   Inpatient Visitation:    Visiting hours are 7 a.m. to 8 p.m. Up to four visitors are allowed at one time in a patient room. The visitors may rotate out with other people during the day. One designated support person (adult) may remain overnight.  Due to an increase in RSV and influenza rates and associated hospitalizations, children ages 81 and under will not be able to visit patients in [facility]. Masks continue to be strongly recommended.

## 2022-08-15 NOTE — Progress Notes (Signed)
  Perioperative Services Pre-Admission/Anesthesia Testing    Date: 08/15/22  Name: Tricia Hill MRN:   [id number]  Re: GLP-1 clearance and provider recommendations   Planned Surgical Procedure(s):    Case: [id number] Date/Time: 08/21/22 1104   Procedure: CYSTOSCOPY/URETEROSCOPY/HOLMIUM LASER/STENT PLACEMENT (Left)   Anesthesia type: General   Pre-op diagnosis: Left Ureteral Stone   Location: [facility] OR ROOM 10 / [facility]S FOR ANESTHESIA GROUP   Surgeons: Abbie Sons, MD      Clinical Notes:  Patient is scheduled for the above procedure with the indicated provider/surgeon. In review of her medication reconciliation it was noted that patient is on a prescribed GLP-1 medication. Per guidelines issued by the American Society of Anesthesiologists (ASA), it is recommended that these medications be held for 7 days prior to the patient undergoing any type of elective surgical procedure. The patient is taking the following GLP-1 medication:  '[]'$  SEMAGLUTIDE   '[]'$  EXENATIDE  '[]'$  LIRAGLUTIDE   '[]'$  LIXISENATIDE  '[]'$  DULAGLUTIDE     '[x]'$  OTHER GLP-1/GIP - tirzepatide  Reached out to prescribing provider Wynetta Emery, DO) to make them aware of the guidelines from anesthesia. Given that this patient takes the prescribed GLP-1 medication for her  diabetes diagnosis, rather than for weight loss, recommendations from the prescribing provider were solicited. Prescribing provider made aware of the following so that informed decision/POC can be developed for this patient that may be taking medications belonging to these drug classes:  Oral GLP-1 medications will be held 1 day prior to surgery.  Injectable GLP-1 medications will be held 7 days prior to surgery.  Metformin is routinely held 48 hours prior to surgery due to renal concerns, potential need for contrasted imaging perioperatively, and the potential for tissue hypoxia leading to drug induced lactic acidosis.  All SGLT2i medications are held 72  hours prior to surgery as they can be associated with the increased potential for developing euglycemic diabetic ketoacidosis (EDKA).   Impression and Plan:  Tricia Hill is on a prescribed GLP-1 medication, which induces the known side effect of decreased gastric emptying. Efforts are bring made to mitigate the risk of perioperative hyperglycemic events, as elevated blood glucose levels have been found to contribute to intra/postoperative complications. Additionally, hyperglycemic extremes can potentially necessitate the postponing of a patient's elective case in order to better optimize perioperative glycemic control, again with the aforementioned guidelines in place. With this in mind, recommendations have been sought from the prescribing provider, who has cleared patient to proceed with holding the prescribed GLP-1/GIP (tirzepatide) as per the guidelines from the ASA.   Provider recommending: no further recommendations received from the prescribing provider.  Copy of signed clearance and recommendations placed on patient's chart for inclusion in their medical record and for review by the surgical/anesthetic team on the day of her procedure.   Honor Loh, MSN, APRN, FNP-C, CEN [facility]  Peri-operative Services Nurse Practitioner Phone: [phone] 08/15/22 2:53 PM  NOTE: This note has been prepared using Dragon dictation software. Despite my best ability to proofread, there is always the potential that unintentional transcriptional errors may still occur from this process.

## 2022-08-20 MED ORDER — FAMOTIDINE 20 MG PO TABS: 20.0000 mg | ORAL_TABLET | Freq: Once | ORAL | Status: AC

## 2022-08-20 MED ORDER — ORAL CARE MOUTH RINSE: 15.0000 mL | Freq: Once | OROMUCOSAL | Status: AC

## 2022-08-20 MED ORDER — CEFAZOLIN SODIUM-DEXTROSE 2-4 GM/100ML-% IV SOLN
2.0000 g | INTRAVENOUS | Status: AC
  Administered 2022-08-21: 2 g via INTRAVENOUS

## 2022-08-20 MED ORDER — SODIUM CHLORIDE 0.9 % IV SOLN: INTRAVENOUS | Status: DC

## 2022-08-20 MED ORDER — CHLORHEXIDINE GLUCONATE 0.12 % MT SOLN: 15.0000 mL | Freq: Once | OROMUCOSAL | Status: AC

## 2022-08-21 ENCOUNTER — Ambulatory Visit: Payer: Commercial Managed Care - PPO | Admitting: Urgent Care

## 2022-08-21 ENCOUNTER — Ambulatory Visit
Admission: RE | Admit: 2022-08-21 | Discharge: 2022-08-21 | Disposition: A | Discharge status: Home / Self Care | Payer: Commercial Managed Care - PPO | Source: Ambulatory Visit | Attending: Urology | Admitting: Urology

## 2022-08-21 ENCOUNTER — Encounter
Admission: RE | Disposition: A | Discharge status: Home / Self Care | Payer: Self-pay | Source: Ambulatory Visit | Attending: Urology

## 2022-08-21 ENCOUNTER — Ambulatory Visit: Payer: Commercial Managed Care - PPO

## 2022-08-21 ENCOUNTER — Encounter: Payer: Self-pay | Admitting: Urology

## 2022-08-21 ENCOUNTER — Other Ambulatory Visit: Payer: Self-pay

## 2022-08-21 DIAGNOSIS — J45909 Unspecified asthma, uncomplicated: Secondary | ICD-10-CM | POA: Diagnosis not present

## 2022-08-21 DIAGNOSIS — E119 Type 2 diabetes mellitus without complications: Secondary | ICD-10-CM | POA: Insufficient documentation

## 2022-08-21 DIAGNOSIS — E1165 Type 2 diabetes mellitus with hyperglycemia: Secondary | ICD-10-CM

## 2022-08-21 DIAGNOSIS — I10 Essential (primary) hypertension: Secondary | ICD-10-CM | POA: Diagnosis not present

## 2022-08-21 DIAGNOSIS — Z79899 Other long term (current) drug therapy: Secondary | ICD-10-CM | POA: Diagnosis not present

## 2022-08-21 DIAGNOSIS — Z7985 Long-term (current) use of injectable non-insulin antidiabetic drugs: Secondary | ICD-10-CM | POA: Diagnosis not present

## 2022-08-21 DIAGNOSIS — F419 Anxiety disorder, unspecified: Secondary | ICD-10-CM | POA: Diagnosis not present

## 2022-08-21 DIAGNOSIS — G4730 Sleep apnea, unspecified: Secondary | ICD-10-CM | POA: Insufficient documentation

## 2022-08-21 DIAGNOSIS — Z7984 Long term (current) use of oral hypoglycemic drugs: Secondary | ICD-10-CM | POA: Diagnosis not present

## 2022-08-21 DIAGNOSIS — N201 Calculus of ureter: Secondary | ICD-10-CM | POA: Insufficient documentation

## 2022-08-21 DIAGNOSIS — Z01812 Encounter for preprocedural laboratory examination: Secondary | ICD-10-CM

## 2022-08-21 HISTORY — PX: CYSTOSCOPY/URETEROSCOPY/HOLMIUM LASER/STENT PLACEMENT: SHX6546

## 2022-08-21 LAB — GLUCOSE, CAPILLARY
Glucose-Capillary: 147 mg/dL — ABNORMAL HIGH (ref 70–99)
Glucose-Capillary: 138 mg/dL — ABNORMAL HIGH (ref 70–99)

## 2022-08-21 SURGERY — CYSTOSCOPY/URETEROSCOPY/HOLMIUM LASER/STENT PLACEMENT
Anesthesia: General | Laterality: Left

## 2022-08-21 MED ORDER — MIDAZOLAM HCL 2 MG/2ML IJ SOLN
INTRAMUSCULAR | Status: AC
  Filled 2022-08-21: qty 2

## 2022-08-21 MED ORDER — LIDOCAINE HCL (PF) 2 % IJ SOLN
INTRAMUSCULAR | Status: AC
  Filled 2022-08-21: qty 5

## 2022-08-21 MED ORDER — OXYCODONE HCL 5 MG PO TABS: 5.0000 mg | ORAL_TABLET | Freq: Once | ORAL | Status: DC | PRN

## 2022-08-21 MED ORDER — FENTANYL CITRATE (PF) 100 MCG/2ML IJ SOLN
INTRAMUSCULAR | Status: AC
  Filled 2022-08-21: qty 2

## 2022-08-21 MED ORDER — SODIUM CHLORIDE 0.9 % IR SOLN
Status: DC | PRN
  Administered 2022-08-21: 1000 mL

## 2022-08-21 MED ORDER — ACETAMINOPHEN 10 MG/ML IV SOLN: 1000.0000 mg | Freq: Once | INTRAVENOUS | Status: DC | PRN

## 2022-08-21 MED ORDER — ONDANSETRON HCL 4 MG/2ML IJ SOLN
INTRAMUSCULAR | Status: DC | PRN
  Administered 2022-08-21: 4 mg via INTRAVENOUS

## 2022-08-21 MED ORDER — DROPERIDOL 2.5 MG/ML IJ SOLN: 0.6250 mg | Freq: Once | INTRAMUSCULAR | Status: DC | PRN

## 2022-08-21 MED ORDER — LIDOCAINE HCL (CARDIAC) PF 100 MG/5ML IV SOSY
PREFILLED_SYRINGE | INTRAVENOUS | Status: DC | PRN
  Administered 2022-08-21: 100 mg via INTRAVENOUS

## 2022-08-21 MED ORDER — OXYBUTYNIN CHLORIDE 5 MG PO TABS
5.0000 mg | ORAL_TABLET | Freq: Three times a day (TID) | ORAL | 0 refills | Status: DC | PRN
  Filled 2022-08-21: qty 15, 5d supply, fill #0

## 2022-08-21 MED ORDER — KETAMINE HCL 10 MG/ML IJ SOLN
INTRAMUSCULAR | Status: DC | PRN
  Administered 2022-08-21: 30 mg via INTRAVENOUS

## 2022-08-21 MED ORDER — FAMOTIDINE 20 MG PO TABS
ORAL_TABLET | ORAL | Status: AC
  Administered 2022-08-21: 20 mg via ORAL
  Filled 2022-08-21: qty 1

## 2022-08-21 MED ORDER — DEXAMETHASONE SODIUM PHOSPHATE 10 MG/ML IJ SOLN
INTRAMUSCULAR | Status: DC | PRN
  Administered 2022-08-21: 5 mg via INTRAVENOUS

## 2022-08-21 MED ORDER — MIDAZOLAM HCL 2 MG/2ML IJ SOLN
INTRAMUSCULAR | Status: DC | PRN
  Administered 2022-08-21: 2 mg via INTRAVENOUS

## 2022-08-21 MED ORDER — OXYBUTYNIN CHLORIDE 5 MG PO TABS
5.0000 mg | ORAL_TABLET | Freq: Once | ORAL | Status: AC
  Administered 2022-08-21: 5 mg via ORAL

## 2022-08-21 MED ORDER — IOHEXOL 180 MG/ML  SOLN
INTRAMUSCULAR | Status: DC | PRN
  Administered 2022-08-21: 20 mL

## 2022-08-21 MED ORDER — OXYBUTYNIN CHLORIDE 5 MG PO TABS
ORAL_TABLET | ORAL | Status: AC
  Filled 2022-08-21: qty 1

## 2022-08-21 MED ORDER — PHENYLEPHRINE HCL (PRESSORS) 10 MG/ML IV SOLN
INTRAVENOUS | Status: DC | PRN
  Administered 2022-08-21 (×2): 100 ug via INTRAVENOUS

## 2022-08-21 MED ORDER — KETAMINE HCL 50 MG/5ML IJ SOSY
PREFILLED_SYRINGE | INTRAMUSCULAR | Status: AC
  Filled 2022-08-21: qty 5

## 2022-08-21 MED ORDER — CEFAZOLIN SODIUM-DEXTROSE 2-4 GM/100ML-% IV SOLN
INTRAVENOUS | Status: AC
  Filled 2022-08-21: qty 100

## 2022-08-21 MED ORDER — FENTANYL CITRATE (PF) 100 MCG/2ML IJ SOLN: 25.0000 ug | INTRAMUSCULAR | Status: DC | PRN

## 2022-08-21 MED ORDER — PROMETHAZINE HCL 25 MG/ML IJ SOLN: 6.2500 mg | INTRAMUSCULAR | Status: DC | PRN

## 2022-08-21 MED ORDER — CHLORHEXIDINE GLUCONATE 0.12 % MT SOLN
OROMUCOSAL | Status: AC
  Administered 2022-08-21: 15 mL via OROMUCOSAL
  Filled 2022-08-21: qty 15

## 2022-08-21 MED ORDER — PROPOFOL 10 MG/ML IV BOLUS
INTRAVENOUS | Status: DC | PRN
  Administered 2022-08-21: 150 mg via INTRAVENOUS

## 2022-08-21 MED ORDER — OXYCODONE-ACETAMINOPHEN 5-325 MG PO TABS
1.0000 | ORAL_TABLET | Freq: Four times a day (QID) | ORAL | 0 refills | Status: DC | PRN
  Filled 2022-08-21: qty 10, 2d supply, fill #0

## 2022-08-21 MED ORDER — FENTANYL CITRATE (PF) 100 MCG/2ML IJ SOLN
INTRAMUSCULAR | Status: DC | PRN
  Administered 2022-08-21 (×2): 25 ug via INTRAVENOUS

## 2022-08-21 MED ORDER — OXYCODONE HCL 5 MG/5ML PO SOLN: 5.0000 mg | Freq: Once | ORAL | Status: DC | PRN

## 2022-08-21 SURGICAL SUPPLY — 28 items
BAG DRAIN SIEMENS DORNER NS (MISCELLANEOUS) ×1 IMPLANT
BAG DRN NS LF (MISCELLANEOUS) ×1
BASKET ZERO TIP 1.9FR (BASKET) IMPLANT
BRUSH SCRUB EZ 1% IODOPHOR (MISCELLANEOUS) ×1 IMPLANT
BSKT STON RTRVL ZERO TP 1.9FR (BASKET) ×1
CATH URET FLEX-TIP 2 LUMEN 10F (CATHETERS) IMPLANT
CATH URETL OPEN END 6X70 (CATHETERS) IMPLANT
CNTNR URN SCR LID CUP LEK RST (MISCELLANEOUS) IMPLANT
CONT SPEC 4OZ STRL OR WHT (MISCELLANEOUS) ×1
DRAPE UTILITY 15X26 TOWEL STRL (DRAPES) ×1 IMPLANT
FIBER LASER MOSES 200 DFL (Laser) IMPLANT
GLOVE SURG UNDER POLY LF SZ7.5 (GLOVE) ×1 IMPLANT
GOWN STRL REUS W/ TWL LRG LVL3 (GOWN DISPOSABLE) ×1 IMPLANT
GOWN STRL REUS W/ TWL XL LVL3 (GOWN DISPOSABLE) ×1 IMPLANT
GOWN STRL REUS W/TWL LRG LVL3 (GOWN DISPOSABLE) ×1
GOWN STRL REUS W/TWL XL LVL3 (GOWN DISPOSABLE) ×1
GUIDEWIRE STR DUAL SENSOR (WIRE) ×1 IMPLANT
IV NS IRRIG 3000ML ARTHROMATIC (IV SOLUTION) ×1 IMPLANT
KIT TURNOVER CYSTO (KITS) ×1 IMPLANT
PACK CYSTO AR (MISCELLANEOUS) ×1 IMPLANT
SET CYSTO W/LG BORE CLAMP LF (SET/KITS/TRAYS/PACK) ×1 IMPLANT
SHEATH NAVIGATOR HD 12/14X36 (SHEATH) IMPLANT
STENT URET 6FRX24 CONTOUR (STENTS) IMPLANT
STENT URET 6FRX26 CONTOUR (STENTS) IMPLANT
SURGILUBE 2OZ TUBE FLIPTOP (MISCELLANEOUS) ×1 IMPLANT
TRAP FLUID SMOKE EVACUATOR (MISCELLANEOUS) ×1 IMPLANT
VALVE UROSEAL ADJ ENDO (VALVE) IMPLANT
WATER STERILE IRR 500ML POUR (IV SOLUTION) ×1 IMPLANT

## 2022-08-21 NOTE — Discharge Instructions (Addendum)
AMBULATORY SURGERY  DISCHARGE INSTRUCTIONS   The drugs that you were given will stay in your system until tomorrow so for the next 24 hours you should not:  Drive an automobile Make any legal decisions Drink any alcoholic beverage   You may resume regular meals tomorrow.  Today it is better to start with liquids and gradually work up to solid foods.  You may eat anything you prefer, but it is better to start with liquids, then soup and crackers, and gradually work up to solid foods.   Please notify your doctor immediately if you have any unusual bleeding, trouble breathing, redness and pain at the surgery site, drainage, fever, or pain not relieved by medication.    Your post-operative visit with Dr.                                       is: Date:                        Time:    Please call to schedule your post-operative visit.  Additional Instructions:  DISCHARGE INSTRUCTIONS FOR KIDNEY STONE/URETERAL STENT   MEDICATIONS:  1. Resume all your other meds from home.  2.  AZO (over-the-counter) can help with the burning/stinging when you urinate. 3.  Oxybutynin is for bladder/stent irritation, Rx was sent to your pharmacy.  ACTIVITY:  1. May resume regular activities in 24 hours. 2. No driving while on narcotic pain medications  3. Drink plenty of water  4. Continue to walk at home - you can still get blood clots when you are at home, so keep active, but don't over do it.  5. May return to work/school tomorrow or when you feel ready   BATHING:  1. You can shower. 2. You have a string coming from your urethra: The stent string is attached to your ureteral stent. Do not pull on this.   SIGNS/SYMPTOMS TO CALL:  Common postoperative symptoms include urinary frequency, urgency, bladder spasm and blood in the urine  Please call us if you have a fever greater than 101.5, uncontrolled nausea/vomiting, uncontrolled pain, dizziness, unable to urinate, excessively bloody urine,  chest pain, shortness of breath, leg swelling, leg pain, or any other concerns or questions.   You can reach [place] at [phone].   FOLLOW-UP:  1. You will be contacted by our office for a follow-up appointment in approximately 1 month 2. You have a string attached to your stent, you may remove it on Friday morning 08/24/2022. To do this, pull the string until the stent is completely removed. You may feel an odd sensation in your back. You may remove stent/string on Friday 3/15  any concerns/issues please call office

## 2022-08-21 NOTE — Anesthesia Preprocedure Evaluation (Signed)
Anesthesia Evaluation  Patient identified by MRN, date of birth, ID band Patient awake    Reviewed: Allergy & Precautions, H&P , NPO status , Patient's Chart, lab work & pertinent test results, reviewed documented beta blocker date and time   History of Anesthesia Complications (+) PONV and history of anesthetic complications  Airway Mallampati: III  TM Distance: >3 FB Neck ROM: full    Dental  (+) Teeth Intact   Pulmonary neg pulmonary ROS, asthma , sleep apnea and Continuous Positive Airway Pressure Ventilation    Pulmonary exam normal        Cardiovascular Exercise Tolerance: Poor hypertension, On Medications negative cardio ROS Normal cardiovascular exam+ Valvular Problems/Murmurs  Rate:Normal     Neuro/Psych   Anxiety      Neuromuscular disease negative neurological ROS  negative psych ROS   GI/Hepatic Neg liver ROS,GERD  Medicated,,  Endo/Other  negative endocrine ROSdiabetes, Well Controlled, Type 2, Oral Hypoglycemic Agents    Renal/GU negative Renal ROS  negative genitourinary   Musculoskeletal   Abdominal   Peds  Hematology negative hematology ROS (+)   Anesthesia Other Findings   Reproductive/Obstetrics negative OB ROS                             Anesthesia Physical Anesthesia Plan  ASA: 3  Anesthesia Plan: General LMA   Post-op Pain Management:    Induction:   PONV Risk Score and Plan: 4 or greater  Airway Management Planned:   Additional Equipment:   Intra-op Plan:   Post-operative Plan:   Informed Consent: I have reviewed the patients History and Physical, chart, labs and discussed the procedure including the risks, benefits and alternatives for the proposed anesthesia with the patient or authorized representative who has indicated his/her understanding and acceptance.       Plan Discussed with: CRNA  Anesthesia Plan Comments:        Anesthesia  Quick Evaluation

## 2022-08-21 NOTE — Interval H&P Note (Signed)
History and Physical Interval Note:  Not aware of passing stone and still with intermittent symptoms.  She desires to proceed with ureteroscopy.  CV: RRR Lungs: Clear  08/21/2022 10:02 AM  Tricia Hill  has presented today for surgery, with the diagnosis of Left Ureteral Stone.  The various methods of treatment have been discussed with the patient and family. After consideration of risks, benefits and other options for treatment, the patient has consented to  Procedure(s): CYSTOSCOPY/URETEROSCOPY/HOLMIUM LASER/STENT PLACEMENT (Left) as a surgical intervention.  The patient's history has been reviewed, patient examined, no change in status, stable for surgery.  I have reviewed the patient's chart and labs.  Questions were answered to the patient's satisfaction.     [place]

## 2022-08-21 NOTE — Transfer of Care (Signed)
Immediate Anesthesia Transfer of Care Note  Patient: Tricia Hill  Procedure(s) Performed: CYSTOSCOPY/URETEROSCOPY/HOLMIUM LASER/STENT PLACEMENT (Left)  Patient Location: PACU  Anesthesia Type:General  Level of Consciousness: awake and alert   Airway & Oxygen Therapy: Patient Spontanous Breathing and Patient connected to face mask oxygen  Post-op Assessment: Report given to RN and Post -op Vital signs reviewed and stable  Post vital signs: Reviewed and stable  Last Vitals:  Vitals Value Taken Time  BP 116/70 08/21/22 1050  Temp 36.7 C 08/21/22 1050  Pulse 87 08/21/22 1052  Resp 16 08/21/22 1052  SpO2 100 % 08/21/22 1052  Vitals shown include unvalidated device data.  Last Pain:  Vitals:   08/21/22 0910  TempSrc: Oral  PainSc: 0-No pain         Complications: No notable events documented.

## 2022-08-21 NOTE — Op Note (Signed)
   Preoperative diagnosis: Left distal ureteral calculus  Postoperative diagnosis: Left distal ureteral calculus  Procedure:  Cystoscopy Left ureteroscopy and stone removal Ureteroscopic laser lithotripsy Left ureteral stent placement (80F/24 cm)  Left retrograde pyelography with interpretation Intraoperative fluoroscopy <30 minutes  Surgeon: Nicki Reaper C. Jonathen Rathman, M.D.  Anesthesia: General  Complications: None  Intraoperative findings: Cystoscopy-bladder mucosa normal in appearance without erythema, solid or papillary lesions.  UOs normal-appearing bilaterally Ureteroscopy- non impacted calculus identified in the distal ureter Left retrograde pyelography post procedure showed no filling defects, stone fragments or contrast extravasation  EBL: Minimal  Specimens: Calculus fragments for analysis   Indication: Tricia Hill is a 52 y.o. female with a 5 mm left distal ureteral calculus.  She was initially scheduled for SWL however the calculus could not be visualized on fluoroscopy and was not treated.  After reviewing the management options for treatment, the patient elected to proceed with the above surgical procedure(s). We have discussed the potential benefits and risks of the procedure, side effects of the proposed treatment, the likelihood of the patient achieving the goals of the procedure, and any potential problems that might occur during the procedure or recuperation. Informed consent has been obtained.  Description of procedure:  The patient was taken to the operating room and general anesthesia was induced.  The patient was placed in the dorsal lithotomy position, prepped and draped in the usual sterile fashion, and preoperative antibiotics were administered. A preoperative time-out was performed.   A 21 French cystoscope was lubricated and passed per urethra.  Panendoscopy was performed with findings as described above  Attention was directed to the left ureteral orifice  and a 0.038 Sensor wire was then advanced up the ureter into the renal pelvis under fluoroscopic guidance.  A 4.5 Fr semirigid ureteroscope was then advanced into the ureter next to the guidewire and the calculus was identified.  The stone was then fragmented with a 200 m Moses holmium laser fiber on a setting of 0.3J/40 Hz  All fragments were then removed from the ureter with a zero tip nitinol basket.  Reinspection of the ureter revealed no remaining visible stones or fragments.  The ureteroscope was advanced to the upper proximal ureter and no additional calculi are identified  Retrograde pyelogram was performed with findings as described above.  A 80F/24 cm Contour ureteral stent was placed under fluoroscopic guidance.  The wire was then removed with an adequate stent curl noted in the renal pelvis as well as in the bladder.  The bladder was then emptied and the procedure ended.  The patient appeared to tolerate the procedure well and without complications.  After anesthetic reversal the patient was transported to the PACU in stable condition.   Plan: The stent was left attached to a tether and she was instructed to remove on Friday, 08/24/2022 A postop follow-up appointment will be scheduled in ~ 1 month   [name], MD

## 2022-08-21 NOTE — Anesthesia Procedure Notes (Signed)
Procedure Name: LMA Insertion Date/Time: 08/21/2022 10:11 AM  Performed by: Fredderick Phenix, CRNAPre-anesthesia Checklist: Patient identified, Emergency Drugs available, Suction available and Patient being monitored Patient Re-evaluated:Patient Re-evaluated prior to induction Oxygen Delivery Method: Circle system utilized Preoxygenation: Pre-oxygenation with 100% oxygen Induction Type: IV induction Ventilation: Mask ventilation without difficulty LMA: LMA inserted LMA Size: 4.0 Tube type: Oral Number of attempts: 1 Airway Equipment and Method: Oral airway Placement Confirmation: positive ETCO2 and breath sounds checked- equal and bilateral Tube secured with: Tape Dental Injury: Teeth and Oropharynx as per pre-operative assessment

## 2022-08-22 ENCOUNTER — Encounter: Payer: Self-pay | Admitting: Urology

## 2022-08-23 NOTE — Anesthesia Postprocedure Evaluation (Signed)
Anesthesia Post Note  Patient: Tricia Hill  Procedure(s) Performed: CYSTOSCOPY/URETEROSCOPY/HOLMIUM LASER/STENT PLACEMENT (Left)  Patient location during evaluation: PACU Anesthesia Type: General Level of consciousness: awake and alert Pain management: pain level controlled Vital Signs Assessment: post-procedure vital signs reviewed and stable Respiratory status: spontaneous breathing, nonlabored ventilation, respiratory function stable and patient connected to nasal cannula oxygen Cardiovascular status: blood pressure returned to baseline and stable Postop Assessment: no apparent nausea or vomiting Anesthetic complications: no   No notable events documented.   Last Vitals:  Vitals:   08/21/22 1130 08/21/22 1144  BP: 117/79 129/88  Pulse: 81 81  Resp: 18 18  Temp: (!) 36.3 C (!) 36.2 C  SpO2: 96% 98%    Last Pain:  Vitals:   08/22/22 0839  TempSrc:   PainSc: [place]

## 2022-08-24 ENCOUNTER — Telehealth: Payer: Self-pay | Admitting: Urology

## 2022-08-24 NOTE — Telephone Encounter (Signed)
Patient lvm that she is having pain after surgery she had on Tuesday 3/12. Her phone # is [phone].

## 2022-08-24 NOTE — Telephone Encounter (Signed)
Patient advised.

## 2022-08-24 NOTE — Telephone Encounter (Signed)
Pain is due to residual swelling from the procedure.  It typically resolves within 24 hours.  Would take pain medication as needed.

## 2022-08-24 NOTE — Telephone Encounter (Signed)
Patient states she had surgery on 08/21/22 and since then has had burning/apin with urination every day. She pulled stent out late last night instead of today because she was hurting so bad. Since getting the stent out she developed left flank pain and has been taking Percocet 2 at a time to help with this. She feels pretty miserable. She has nausea yesterday and took Zofran for that which helped. No fever. No blood. She has been taking AZO since 08/21/22 and Oxybutynin. Is there anything else patient should be doing at this time? She is worried that this is not normal.

## 2022-08-27 NOTE — Telephone Encounter (Signed)
She only had 1 stone that was completely removed.  Symptoms are consistent with residual edema of the ureter.  She could have possibly had a blood clot causing obstruction.  Would continue the oxybutynin as needed.  She can pick up Gemtesa samples (2 weeks worth) if needed.  Would make sure she drinks plenty of fluids

## 2022-08-27 NOTE — Telephone Encounter (Signed)
Left detailed message on voicemail ok per DPR on file and advised as listed below. Samples placed up front for pick up when possible

## 2022-08-27 NOTE — Telephone Encounter (Signed)
Patient called back today. Patient states yesterday morning at 2 am she had very intense left flank pain and vaginal pressure, pain was so intense she was shaking and that lasted about 2 hours. It felt like she had another kidney stone and then maybe passed it. She had nausea yesterday but no vomiting or fever. The pain has subsided now but she still is having pelvic/vaginal pressure, like she needs to urinate feeling but she does not need to. She wanted to get advice on this any other recommendations

## 2022-08-28 ENCOUNTER — Telehealth: Payer: Self-pay

## 2022-08-28 ENCOUNTER — Other Ambulatory Visit: Payer: Self-pay

## 2022-08-28 LAB — CALCULI, WITH PHOTOGRAPH (CLINICAL LAB)
Calcium Oxalate Dihydrate: 20 %
Calcium Oxalate Monohydrate: 80 %
Weight Calculi: 16 mg

## 2022-08-28 MED ORDER — OXYBUTYNIN CHLORIDE 5 MG PO TABS
5.0000 mg | ORAL_TABLET | Freq: Three times a day (TID) | ORAL | 1 refills | Status: DC | PRN
  Filled 2022-08-28: qty 15, 5d supply, fill #0

## 2022-08-28 NOTE — Telephone Encounter (Signed)
Call received via triage line 3/18 344pm   Pt states she received AH message. She will pick up samples.  She is requestign a refill of oxybutynin. Per SCS message to [facility] she may continue.   Refill erxed.    Pt aware.

## 2022-08-29 DIAGNOSIS — Z6841 Body Mass Index (BMI) 40.0 and over, adult: Secondary | ICD-10-CM | POA: Diagnosis not present

## 2022-08-29 DIAGNOSIS — M5412 Radiculopathy, cervical region: Secondary | ICD-10-CM | POA: Diagnosis not present

## 2022-08-29 DIAGNOSIS — M4802 Spinal stenosis, cervical region: Secondary | ICD-10-CM | POA: Diagnosis not present

## 2022-09-04 ENCOUNTER — Other Ambulatory Visit: Payer: Self-pay

## 2022-09-12 ENCOUNTER — Ambulatory Visit: Payer: Commercial Managed Care - PPO | Admitting: Family Medicine

## 2022-09-19 ENCOUNTER — Telehealth: Payer: Self-pay | Admitting: Family Medicine

## 2022-09-19 ENCOUNTER — Ambulatory Visit: Payer: Commercial Managed Care - PPO | Admitting: Family Medicine

## 2022-09-19 NOTE — Telephone Encounter (Signed)
Just a FYI

## 2022-09-19 NOTE — Telephone Encounter (Signed)
Pt was already scheduled for 10/01/2022 @ 2 pm.

## 2022-09-19 NOTE — Telephone Encounter (Signed)
Copied from CRM [id number]. Topic: General - Other >> Sep 19, 2022 10:02 AM Marlow Baars wrote: Reason for CRM: The patient called in to let her provider she is so sorry she overslept for her appt this morning 04/10. She said she worked an extra shift this week and was just worn out.

## 2022-09-21 ENCOUNTER — Ambulatory Visit: Payer: Commercial Managed Care - PPO | Admitting: Urology

## 2022-09-25 ENCOUNTER — Other Ambulatory Visit: Payer: Self-pay | Admitting: Family Medicine

## 2022-09-25 ENCOUNTER — Other Ambulatory Visit: Payer: Self-pay

## 2022-09-26 ENCOUNTER — Encounter: Payer: Self-pay | Admitting: Urology

## 2022-09-26 ENCOUNTER — Ambulatory Visit (INDEPENDENT_AMBULATORY_CARE_PROVIDER_SITE_OTHER): Payer: Commercial Managed Care - PPO | Admitting: Urology

## 2022-09-26 ENCOUNTER — Other Ambulatory Visit: Payer: Self-pay

## 2022-09-26 VITALS — BP 108/72 | HR 81 | Ht 59.0 in | Wt 220.0 lb

## 2022-09-26 DIAGNOSIS — Z87442 Personal history of urinary calculi: Secondary | ICD-10-CM

## 2022-09-26 DIAGNOSIS — Z09 Encounter for follow-up examination after completed treatment for conditions other than malignant neoplasm: Secondary | ICD-10-CM

## 2022-09-26 MED FILL — Glucose Blood Test Strip: 90 days supply | Qty: 100 | Fill #0 | Status: AC

## 2022-09-26 MED FILL — Lancets: 90 days supply | Qty: 100 | Fill #0 | Status: AC

## 2022-09-26 NOTE — Progress Notes (Signed)
I, DeAsia L Maxie,acting as a scribe for Riki Altes, MD.,have documented all relevant documentation on the behalf of Riki Altes, MD,as directed by  Riki Altes, MD while in the presence of Riki Altes, MD.   09/26/22 8:31 AM   Tricia Hill 17-Sep-1970 [id number]  Referring provider: Dorcas Carrow, DO [address] [place],  [place] [address]  Chief Complaint  Patient presents with   Other    HPI: Tricia Hill is a 52 y.o. female who presents for a one month postop visit.  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   Asthma    Diabetes mellitus without complication 2016   GERD (gastroesophageal reflux disease)    Heart murmur    child   History of kidney stones    Hypertension    Neuromuscular disorder    right hands tingling and numbness   PONV (postoperative nausea and vomiting)    Shingles    Sleep apnea    USE CPAP    Surgical History: Past Surgical History:  Procedure Laterality Date   ANTERIOR CERVICAL DECOMP/DISCECTOMY FUSION N/A 04/13/2022   Procedure: Anterior Cervical Discectomy Fusion - Cervical seven-Thoracic one removal of plate;  Surgeon: Julio Sicks, MD;  [facility];  Service: Neurosurgery;  Laterality: N/A;   CESAREAN SECTION     X 2   CHOLECYSTECTOMY N/A 01/18/2017   Procedure: LAPAROSCOPIC CHOLECYSTECTOMY WITH INTRAOPERATIVE CHOLANGIOGRAM;  Surgeon: Earline Mayotte, MD;  Location: [facility] ORS;  Service: General;  Laterality: N/A;   COLONOSCOPY WITH PROPOFOL N/A 10/13/2020   Procedure: COLONOSCOPY WITH PROPOFOL;   Surgeon: Toney Reil, MD;  Location: [facility] ENDOSCOPY;  Service: Gastroenterology;  Laterality: N/A;  COVID POSITIVE 09/30/2020   CYSTOSCOPY/URETEROSCOPY/HOLMIUM LASER/STENT PLACEMENT Left 08/21/2022   Procedure: CYSTOSCOPY/URETEROSCOPY/HOLMIUM LASER/STENT PLACEMENT;  Surgeon: Riki Altes, MD;  Location: [facility] ORS;  Service: Urology;  Laterality: Left;   DIAGNOSTIC LAPAROSCOPY  2006   EXCISION OF ABDOMINAL WALL ENDOMETRIOMA   EXTRACORPOREAL SHOCK WAVE LITHOTRIPSY Left 07/12/2022   Procedure: EXTRACORPOREAL SHOCK WAVE LITHOTRIPSY (ESWL);  Surgeon: Vanna Scotland, MD;  Location: [facility] ORS;  Service: Urology;  Laterality: Left;   SPINAL FUSION  2012   C3 and C4   TOTAL ABDOMINAL HYSTERECTOMY     Partial    Home Medications:  Allergies as of 09/26/2022       Reactions   Lisinopril Cough   Semaglutide Nausea And Vomiting   Ultram [tramadol Hcl] Nausea And Vomiting   Severe vomiting    Victoza [liraglutide] Nausea And Vomiting        Medication List        Accurate as of September 26, 2022  8:31 AM. If you have any questions, ask your nurse or doctor.          STOP taking these medications    ondansetron 4 MG disintegrating tablet Commonly known as: ZOFRAN-ODT Stopped by: Riki Altes, MD   oxybutynin 5 MG tablet Commonly known as: DITROPAN Stopped by: Riki Altes, MD   oxyCODONE-acetaminophen 5-325 MG tablet Commonly known as:  Percocet Stopped by: Riki Altes, MD   tamsulosin 0.4 MG Caps capsule Commonly known as: FLOMAX Stopped by: Riki Altes, MD       TAKE these medications    atorvastatin 40 MG tablet Commonly known as: LIPITOR Take 1 tablet (40 mg total) by mouth once a week.   escitalopram 5 MG tablet Commonly known as: LEXAPRO Take 1 tablet (5 mg total) by mouth daily.   freestyle lancets USE AS INSTRUCTED   FREESTYLE LITE test strip Generic drug: glucose blood USE AS DIRECTED   gabapentin 100 MG capsule Commonly known as:  NEURONTIN Take 1 capsule (100 mg total) by mouth 3 (three) times daily. What changed:  when to take this reasons to take this   glucose monitoring kit monitoring kit 1 each by Does not apply route as needed for other.   ibuprofen 600 MG tablet Commonly known as: ADVIL Take 1 tablet (600 mg total) by mouth every 8 (eight) hours as needed for moderate pain.   losartan 25 MG tablet Commonly known as: Cozaar Take 1/2 tablet (12.5 mg total) by mouth daily. (Take 0.5 tablets (12.5 mg total) by mouth daily.)   metFORMIN 1000 MG tablet Commonly known as: GLUCOPHAGE Take 1 tablet (1,000 mg total) by mouth 2 (two) times daily.   Mounjaro 5 MG/0.5ML Pen Generic drug: tirzepatide Inject 5 mg into the skin once a week.   ondansetron 4 MG tablet Commonly known as: Zofran Take 1 tablet (4 mg total) by mouth every 8 (eight) hours as needed for nausea or vomiting.        Allergies:  Allergies  Allergen Reactions   Lisinopril Cough   Semaglutide Nausea And Vomiting   Ultram [Tramadol Hcl] Nausea And Vomiting    Severe vomiting    Victoza [Liraglutide] Nausea And Vomiting    Family History: Family History  Problem Relation Age of Onset   Hypertension Mother    Diabetes Mother    Hypertension Father    Luiz Blare' disease Sister    Clotting disorder Brother    Heart disease Maternal Grandmother    Heart disease Maternal Grandfather    Kidney disease Neg Hx    Breast cancer Neg Hx     Social History:  reports that she has never smoked. She has never used smokeless tobacco. She reports that she does not currently use alcohol. She reports that she does not use drugs.   Physical Exam: BP 108/72   Pulse 81   Ht  (1.499 m)   Wt 220 lb (99.8 kg)   LMP  (LMP Unknown)   BMI 44.43 kg/m   Constitutional:  Alert and oriented, No acute distress. HEENT: [place] AT Respiratory: Normal respiratory effort, no increased work of breathing. Psychiatric: Normal mood and  affect.   Assessment & Plan:    History urinary calculi Doing well  Status post ureteroscopic stone removal  We discussed options of metabolic evaluation vs general stone prevention guidelines and she elected the latter. It was recommended  she increase her water intake to keep urine output greater than 2-2.5 L per day.  3 L/quarts of water per day is generally enough to produce this output.  Oxalate moderation was discussed and she was provided literature on high oxalate foods and beverages.  Avoidance of salty foods and added salt was discussed as well as avoidance of excessive intake of animal protein.  Increased intake of potassium rich citrus products was recommended. One year follow-up with [facility]  I have reviewed the above documentation for accuracy and completeness, and I agree with the above.   Riki Altes, MD  [facility] [address], [address] [place], [place] [address] [phone]

## 2022-09-26 NOTE — Telephone Encounter (Signed)
Requested Prescriptions  Pending Prescriptions Disp Refills   FREESTYLE LITE test strip [Pharmacy Med Name: glucose blood (FREESTYLE LITE) test strip] 100 strip 0    Sig: USE AS DIRECTED     Endocrinology: Diabetes - Testing Supplies Passed - 09/25/2022  6:04 PM      Passed - Valid encounter within last 12 months    Recent Outpatient Visits           3 months ago Type 2 diabetes mellitus with hyperglycemia, without long-term current use of insulin (HCC)   [place] [facility] [place], Megan P, DO   6 months ago Type 2 diabetes mellitus with hyperglycemia, without long-term current use of insulin (HCC)   [facility] [place], Megan P, DO   7 months ago Osteoarthritis of spine with radiculopathy, thoracic region   [facility] [place], Megan P, DO   9 months ago Routine general medical examination at a health care facility   [facility], Megan P, DO   11 months ago Bilateral arm pain   [place] [facility] [place], [place], DO       Future Appointments             In 5 days Dorcas Carrow, DO [facility]   In 1 year Stoioff, Verna Czech, MD [facility] [place]             Lancets (FREESTYLE) lancets 100 each 0    Sig: USE AS INSTRUCTED     Endocrinology: Diabetes - Testing Supplies Passed - 09/25/2022  6:04 PM      Passed - Valid encounter within last 12 months    Recent Outpatient Visits           3 months ago Type 2 diabetes mellitus with hyperglycemia, without long-term current use of insulin (HCC)   [place] [facility] [place], Megan P, DO   6 months ago Type 2 diabetes mellitus with hyperglycemia, without long-term current use of insulin (HCC)   [place] [facility] [place], Megan P, DO   7 months ago Osteoarthritis of spine with radiculopathy, thoracic region   [facility] [place], Megan P, DO   9 months ago Routine general medical examination at a health care facility   [facility], Megan P, DO   11 months ago Bilateral arm pain   [place] [place], [place], DO       Future Appointments             In 5 days Laural Benes, Oralia Rud, DO  [facility]   In 1 year [place], Verna Czech, MD [facility] [place]

## 2022-09-27 ENCOUNTER — Other Ambulatory Visit: Payer: Self-pay

## 2022-10-01 ENCOUNTER — Ambulatory Visit: Payer: Commercial Managed Care - PPO | Admitting: Family Medicine

## 2022-10-01 ENCOUNTER — Other Ambulatory Visit: Payer: Self-pay

## 2022-10-01 VITALS — BP 105/64 | HR 78 | Temp 98.7°F | Ht 59.0 in | Wt 216.8 lb

## 2022-10-01 DIAGNOSIS — G4700 Insomnia, unspecified: Secondary | ICD-10-CM | POA: Diagnosis not present

## 2022-10-01 DIAGNOSIS — E1165 Type 2 diabetes mellitus with hyperglycemia: Secondary | ICD-10-CM

## 2022-10-01 LAB — BAYER DCA HB A1C WAIVED: HB A1C (BAYER DCA - WAIVED): 7.6 % — ABNORMAL HIGH (ref 4.8–5.6)

## 2022-10-01 MED ORDER — TRAZODONE HCL 50 MG PO TABS
25.0000 mg | ORAL_TABLET | Freq: Every evening | ORAL | 3 refills | Status: DC | PRN
  Filled 2022-10-01: qty 30, 30d supply, fill #0
  Filled 2022-11-28: qty 30, 30d supply, fill #1

## 2022-10-01 MED ORDER — TIRZEPATIDE 7.5 MG/0.5ML ~~LOC~~ SOAJ
7.5000 mg | SUBCUTANEOUS | 1 refills | Status: DC
  Filled 2022-10-01: qty 2, 28d supply, fill #0
  Filled 2022-11-28: qty 2, 28d supply, fill #1
  Filled 2023-01-01: qty 2, 28d supply, fill #2

## 2022-10-01 NOTE — Progress Notes (Signed)
BP 105/64   Pulse 78   Temp 98.7 F (37.1 C) (Oral)   Ht  (1.499 m)   Wt 216 lb 12.8 oz (98.3 kg)   LMP  (LMP Unknown)   SpO2 98%   BMI 43.79 kg/m    Subjective:    Patient ID: Tricia Hill, female    DOB: May 28, 1971, 52 y.o.   MRN: [id number]  HPI: Tricia Hill is a 52 y.o. female  Chief Complaint  Patient presents with   Diabetes   DIABETES Hypoglycemic episodes:no Polydipsia/polyuria: no Visual disturbance: no Chest pain: no Paresthesias: no Glucose Monitoring: yes  Accucheck frequency: Daily Taking Insulin?: no Blood Pressure Monitoring: a few times a month Retinal Examination: Up to Date Foot Exam: Up to Date Diabetic Education: Completed Pneumovax: Up to Date Influenza: Up to Date Aspirin: no  Relevant past medical, surgical, family and social history reviewed and updated as indicated. Interim medical history since our last visit reviewed. Allergies and medications reviewed and updated.  Review of Systems  Constitutional: Negative.   Respiratory: Negative.    Cardiovascular: Negative.   Gastrointestinal: Negative.   Musculoskeletal: Negative.   Neurological: Negative.   Psychiatric/Behavioral: Negative.      Per HPI unless specifically indicated above     Objective:    BP 105/64   Pulse 78   Temp 98.7 F (37.1 C) (Oral)   Ht  (1.499 m)   Wt 216 lb 12.8 oz (98.3 kg)   LMP  (LMP Unknown)   SpO2 98%   BMI 43.79 kg/m   Wt Readings from Last 3 Encounters:  10/01/22 216 lb 12.8 oz (98.3 kg)  09/26/22 220 lb (99.8 kg)  08/21/22 223 lb (101.2 kg)    Physical Exam Vitals and nursing note reviewed.  Constitutional:      General: She is not in acute distress.    Appearance: Normal appearance. She is obese. She is not ill-appearing, toxic-appearing or diaphoretic.  HENT:     Head: Normocephalic and atraumatic.     Right Ear: External ear normal.     Left Ear: External ear normal.     Nose: Nose normal.      Mouth/Throat:     Mouth: Mucous membranes are moist.     Pharynx: Oropharynx is clear.  Eyes:     General: No scleral icterus.       Right eye: No discharge.        Left eye: No discharge.     Extraocular Movements: Extraocular movements intact.     Conjunctiva/sclera: Conjunctivae normal.     Pupils: Pupils are equal, round, and reactive to light.  Cardiovascular:     Rate and Rhythm: Normal rate and regular rhythm.     Pulses: Normal pulses.     Heart sounds: Normal heart sounds. No murmur heard.    No friction rub. No gallop.  Pulmonary:     Effort: Pulmonary effort is normal. No respiratory distress.     Breath sounds: Normal breath sounds. No stridor. No wheezing, rhonchi or rales.  Chest:     Chest wall: No tenderness.  Musculoskeletal:        General: Normal range of motion.     Cervical back: Normal range of motion and neck supple.  Skin:    General: Skin is warm and dry.     Capillary Refill: Capillary refill takes less than 2 seconds.     Coloration: Skin is not jaundiced or pale.  Findings: No bruising, erythema, lesion or rash.  Neurological:     General: No focal deficit present.     Mental Status: She is alert and oriented to person, place, and time. Mental status is at baseline.  Psychiatric:        Mood and Affect: Mood normal.        Behavior: Behavior normal.        Thought Content: Thought content normal.        Judgment: Judgment normal.     Results for orders placed or performed during the hospital encounter of 08/21/22  Glucose, capillary  Result Value Ref Range   Glucose-Capillary 147 (H) 70 - 99 mg/dL   Comment 1 Notify RN    Comment 2 Document in Chart   Calculi, with Photograph (to Clinical Lab)  Result Value Ref Range   Source Calculi Comment    Color Calculi Brown    Size Calculi 2x3 mm   Weight Calculi 16 mg   Composition Calculi Comment    Calcium Oxalate Monohydrate 80 %   Calcium Oxalate Dihydrate 20 %   Comment Calculi 2  Comment    Photo Calculi Comment    Comment Calculi 3 Comment    Please Note: Comment    DISCLAIMER: Comment   Glucose, capillary  Result Value Ref Range   Glucose-Capillary 138 (H) 70 - 99 mg/dL   Comment 1 Notify RN    Comment 2 Document in Chart       Assessment & Plan:   Problem List Items Addressed This Visit       Endocrine   Type 2 diabetes mellitus with hyperglycemia - Primary    Doing great with A1c of 7.6. Will increase her mounjaro to 7.5mg  and recheck 3 months. Call with any concerns. Continue to monitor.       Relevant Medications   tirzepatide (MOUNJARO) 7.5 MG/0.5ML Pen   Other Relevant Orders   Bayer DCA Hb A1c Waived   Other Visit Diagnoses     Insomnia, unspecified type       Will start trazodone. Call with any concerns. Continue to monitor.        Follow up plan: Return in about 3 months (around 12/31/2022).

## 2022-10-01 NOTE — Assessment & Plan Note (Signed)
Doing great with A1c of 7.6. Will increase her mounjaro to 7.5mg  and recheck 3 months. Call with any concerns. Continue to monitor.

## 2022-10-21 ENCOUNTER — Other Ambulatory Visit: Payer: Self-pay

## 2022-10-22 ENCOUNTER — Other Ambulatory Visit: Payer: Self-pay

## 2022-10-23 ENCOUNTER — Other Ambulatory Visit: Payer: Self-pay

## 2022-10-23 ENCOUNTER — Other Ambulatory Visit: Payer: Self-pay | Admitting: Family Medicine

## 2022-10-23 MED ORDER — GABAPENTIN 100 MG PO CAPS
100.0000 mg | ORAL_CAPSULE | Freq: Three times a day (TID) | ORAL | 1 refills | Status: DC
  Filled 2022-10-23 – 2022-11-28 (×2): qty 270, 90d supply, fill #0

## 2022-10-23 NOTE — Telephone Encounter (Signed)
Requested Prescriptions  Pending Prescriptions Disp Refills   gabapentin (NEURONTIN) 100 MG capsule 270 capsule 1    Sig: Take 1 capsule (100 mg total) by mouth 3 (three) times daily.     Neurology: Anticonvulsants - gabapentin Passed - 10/23/2022  8:14 AM      Passed - Cr in normal range and within 360 days    Creatinine, Ser  Date Value Ref Range Status  07/02/2022 0.85 0.44 - 1.00 mg/dL Final         Passed - Completed PHQ-2 or PHQ-9 in the last 360 days      Passed - Valid encounter within last 12 months    Recent Outpatient Visits           3 weeks ago Type 2 diabetes mellitus with hyperglycemia, without long-term current use of insulin (HCC)   [place] [facility] [place], Megan P, DO   4 months ago Type 2 diabetes mellitus with hyperglycemia, without long-term current use of insulin (HCC)   [place] [facility] [place], Megan P, DO   7 months ago Type 2 diabetes mellitus with hyperglycemia, without long-term current use of insulin (HCC)   [place] [facility] [place], Megan P, DO   8 months ago Osteoarthritis of spine with radiculopathy, thoracic region   [facility] [place], Megan P, DO   10 months ago Routine general medical examination at a health care facility   [facility] [place], Oralia Rud, DO       Future Appointments             In 2 months Laural Benes, Oralia Rud, DO [place] [facility]   In 11 months Stoioff, Verna Czech, MD [facility] [place]

## 2022-11-09 ENCOUNTER — Other Ambulatory Visit: Payer: Self-pay

## 2022-11-28 ENCOUNTER — Other Ambulatory Visit: Payer: Self-pay

## 2022-11-29 ENCOUNTER — Other Ambulatory Visit: Payer: Self-pay

## 2023-01-01 ENCOUNTER — Other Ambulatory Visit: Payer: Self-pay

## 2023-01-08 ENCOUNTER — Encounter: Payer: Self-pay | Admitting: Family Medicine

## 2023-01-08 ENCOUNTER — Ambulatory Visit: Payer: Commercial Managed Care - PPO | Admitting: Family Medicine

## 2023-01-08 ENCOUNTER — Other Ambulatory Visit: Payer: Self-pay

## 2023-01-08 VITALS — BP 119/82 | HR 92 | Temp 98.2°F | Wt 217.4 lb

## 2023-01-08 DIAGNOSIS — E1165 Type 2 diabetes mellitus with hyperglycemia: Secondary | ICD-10-CM | POA: Diagnosis not present

## 2023-01-08 DIAGNOSIS — Z1231 Encounter for screening mammogram for malignant neoplasm of breast: Secondary | ICD-10-CM | POA: Diagnosis not present

## 2023-01-08 DIAGNOSIS — Z23 Encounter for immunization: Secondary | ICD-10-CM | POA: Diagnosis not present

## 2023-01-08 DIAGNOSIS — E785 Hyperlipidemia, unspecified: Secondary | ICD-10-CM | POA: Diagnosis not present

## 2023-01-08 DIAGNOSIS — I10 Essential (primary) hypertension: Secondary | ICD-10-CM

## 2023-01-08 DIAGNOSIS — Z0000 Encounter for general adult medical examination without abnormal findings: Secondary | ICD-10-CM | POA: Diagnosis not present

## 2023-01-08 DIAGNOSIS — E1169 Type 2 diabetes mellitus with other specified complication: Secondary | ICD-10-CM

## 2023-01-08 DIAGNOSIS — Z7985 Long-term (current) use of injectable non-insulin antidiabetic drugs: Secondary | ICD-10-CM

## 2023-01-08 DIAGNOSIS — F419 Anxiety disorder, unspecified: Secondary | ICD-10-CM | POA: Diagnosis not present

## 2023-01-08 LAB — URINALYSIS, ROUTINE W REFLEX MICROSCOPIC
Bilirubin, UA: NEGATIVE
Glucose, UA: NEGATIVE
Ketones, UA: NEGATIVE
Leukocytes,UA: NEGATIVE
Nitrite, UA: NEGATIVE
Protein,UA: NEGATIVE
RBC, UA: NEGATIVE
Specific Gravity, UA: >1.03 — ABNORMAL HIGH (ref 1.005–1.030)
Urobilinogen, Ur: 0.2 mg/dL (ref 0.2–1.0)
pH, UA: 5.5 (ref 5.0–7.5)

## 2023-01-08 LAB — MICROALBUMIN, URINE WAIVED
Creatinine, Urine Waived: 300 mg/dL (ref 10–300)
Microalb, Ur Waived: 80 mg/L — ABNORMAL HIGH (ref 0–19)

## 2023-01-08 LAB — BAYER DCA HB A1C WAIVED: HB A1C (BAYER DCA - WAIVED): 7.6 % — ABNORMAL HIGH (ref 4.8–5.6)

## 2023-01-08 MED ORDER — ESCITALOPRAM OXALATE 5 MG PO TABS
5.0000 mg | ORAL_TABLET | Freq: Every day | ORAL | 1 refills | Status: DC
  Filled 2023-01-08 – 2023-03-15 (×2): qty 90, 90d supply, fill #0
  Filled 2023-06-10: qty 90, 90d supply, fill #1

## 2023-01-08 MED ORDER — ATORVASTATIN CALCIUM 40 MG PO TABS
40.0000 mg | ORAL_TABLET | ORAL | 0 refills | Status: DC
  Filled 2023-01-08: qty 52, 364d supply, fill #0
  Filled 2023-03-15: qty 13, 90d supply, fill #0
  Filled 2023-06-10: qty 13, 90d supply, fill #1
  Filled 2023-10-02: qty 13, 90d supply, fill #2

## 2023-01-08 MED ORDER — TIRZEPATIDE 10 MG/0.5ML ~~LOC~~ SOAJ
10.0000 mg | SUBCUTANEOUS | 1 refills | Status: DC
  Filled 2023-01-08 – 2023-02-12 (×2): qty 2, 28d supply, fill #0
  Filled 2023-03-15: qty 2, 28d supply, fill #1
  Filled 2023-04-09: qty 2, 28d supply, fill #2
  Filled 2023-05-01: qty 2, 28d supply, fill #3
  Filled 2023-05-31: qty 2, 28d supply, fill #4
  Filled 2023-07-10: qty 2, 28d supply, fill #5

## 2023-01-08 MED ORDER — METFORMIN HCL 1000 MG PO TABS
1000.0000 mg | ORAL_TABLET | Freq: Two times a day (BID) | ORAL | 1 refills | Status: DC
  Filled 2023-01-08 – 2023-03-15 (×2): qty 180, 90d supply, fill #0
  Filled 2023-06-10: qty 180, 90d supply, fill #1

## 2023-01-08 MED ORDER — LOSARTAN POTASSIUM 25 MG PO TABS
12.5000 mg | ORAL_TABLET | Freq: Every day | ORAL | 1 refills | Status: DC
  Filled 2023-01-08 – 2023-03-15 (×2): qty 45, 90d supply, fill #0
  Filled 2023-06-10: qty 45, 90d supply, fill #1

## 2023-01-08 MED ORDER — GABAPENTIN 100 MG PO CAPS
100.0000 mg | ORAL_CAPSULE | Freq: Three times a day (TID) | ORAL | 1 refills | Status: DC
  Filled 2023-01-08 – 2023-03-15 (×2): qty 270, 90d supply, fill #0
  Filled 2023-06-10: qty 270, 90d supply, fill #1

## 2023-01-08 MED ORDER — ONDANSETRON HCL 4 MG PO TABS
4.0000 mg | ORAL_TABLET | Freq: Three times a day (TID) | ORAL | 3 refills | Status: AC | PRN
  Filled 2023-01-08: qty 60, 20d supply, fill #0

## 2023-01-08 MED ORDER — TRAZODONE HCL 50 MG PO TABS
25.0000 mg | ORAL_TABLET | Freq: Every evening | ORAL | 1 refills | Status: DC | PRN
  Filled 2023-01-08 – 2023-02-12 (×3): qty 90, 90d supply, fill #0
  Filled 2023-06-10: qty 90, 90d supply, fill #1

## 2023-01-08 NOTE — Progress Notes (Signed)
BP 119/82   Pulse 92   Temp 98.2 F (36.8 C) (Oral)   Wt 217 lb 6.4 oz (98.6 kg)   LMP  (LMP Unknown)   SpO2 98%   BMI 43.91 kg/m    Subjective:    Patient ID: Tricia Hill, female    DOB: [birth year], 52 y.o.   MRN: [id number]  HPI: Tricia Hill is a 52 y.o. female presenting on 01/08/2023 for comprehensive medical examination. Current medical complaints include:  DIABETES Hypoglycemic episodes:no Polydipsia/polyuria: no Visual disturbance: no Chest pain: no Paresthesias: no Glucose Monitoring: yes  Accucheck frequency: Daily Taking Insulin?: no Blood Pressure Monitoring: not checking Retinal Examination: Up to Date Foot Exam: Up to Date Diabetic Education: Completed Pneumovax: Up to Date Influenza: Up to Date Aspirin: no  HYPERTENSION / HYPERLIPIDEMIA Satisfied with current treatment? yes Duration of hypertension: chronic BP monitoring frequency: not checking BP medication side effects: no Past BP meds: losartan Duration of hyperlipidemia: chronic Cholesterol medication side effects: no Cholesterol supplements: fish oil Past cholesterol medications: atorvastatin Medication compliance: excellent compliance Aspirin: no Recent stressors: no Recurrent headaches: no Visual changes: no Palpitations: no Dyspnea: no Chest pain: no Lower extremity edema: no Dizzy/lightheaded: no  Menopausal Symptoms: no  Depression Screen done today and results listed below:     10/01/2022    2:05 PM 06/13/2022   10:06 AM 03/09/2022    8:50 AM 12/07/2021    9:26 AM 10/30/2021    3:03 PM  Depression screen PHQ 2/9  Decreased Interest 0 1 0 0 0  Down, Depressed, Hopeless 0 0 0 0 0  PHQ - 2 Score 0 1 0 0 0  Altered sleeping 2 1 2 1  0  Tired, decreased energy 2 1 1 1  0  Change in appetite 0 0 1 0 0  Feeling bad or failure about yourself  0 0 0 0 0  Trouble concentrating 0 0 0 0 0  Moving slowly or fidgety/restless 0 0 0 0 0  Suicidal thoughts 0 0 0 0 0  PHQ-9 Score  4 3 4 2  0  Difficult doing work/chores  Not difficult at all Somewhat difficult Not difficult at all Not difficult at all    Past Medical History:  Past Medical History:  Diagnosis Date   Anxiety    Arthritis    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   Asthma    Diabetes mellitus without complication (HCC) 2016   GERD (gastroesophageal reflux disease)    Heart murmur    child   History of kidney stones    Hypertension    Neuromuscular disorder (HCC)    right hands tingling and numbness   PONV (postoperative nausea and vomiting)    Shingles    Sleep apnea    USE CPAP    Surgical History:  Past Surgical History:  Procedure Laterality Date   ANTERIOR CERVICAL DECOMP/DISCECTOMY FUSION N/A 04/13/2022   Procedure: Anterior Cervical Discectomy Fusion - Cervical seven-Thoracic one removal of plate;  Surgeon: Julio Sicks, MD;  [facility];  Service: Neurosurgery;  Laterality: N/A;   CESAREAN SECTION     X 2   CHOLECYSTECTOMY N/A 01/18/2017   Procedure: LAPAROSCOPIC CHOLECYSTECTOMY WITH INTRAOPERATIVE CHOLANGIOGRAM;  Surgeon: Earline Mayotte, MD;  Location: [facility] ORS;  Service: General;  Laterality: N/A;   COLONOSCOPY WITH PROPOFOL N/A 10/13/2020   Procedure: COLONOSCOPY WITH PROPOFOL;  Surgeon: Toney Reil, MD;  Location: [facility] ENDOSCOPY;  Service: Gastroenterology;  Laterality: N/A;  COVID POSITIVE  09/30/2020   CYSTOSCOPY/URETEROSCOPY/HOLMIUM LASER/STENT PLACEMENT Left 08/21/2022   Procedure: CYSTOSCOPY/URETEROSCOPY/HOLMIUM LASER/STENT PLACEMENT;  Surgeon: Riki Altes, MD;  Location: [facility] ORS;  Service: Urology;  Laterality: Left;   DIAGNOSTIC LAPAROSCOPY  2006   EXCISION OF ABDOMINAL WALL ENDOMETRIOMA   EXTRACORPOREAL SHOCK WAVE LITHOTRIPSY Left 07/12/2022   Procedure: EXTRACORPOREAL SHOCK WAVE LITHOTRIPSY (ESWL);  Surgeon: Vanna Scotland, MD;  Location: [facility] ORS;  Service: Urology;  Laterality: Left;   SPINAL FUSION  2012   C3 and C4   TOTAL ABDOMINAL HYSTERECTOMY      Partial    Medications:  Current Outpatient Medications on File Prior to Visit  Medication Sig   glucose blood (FREESTYLE LITE) test strip USE AS DIRECTED   glucose monitoring kit (FREESTYLE) monitoring kit 1 each by Does not apply route as needed for other.   ibuprofen (ADVIL) 600 MG tablet Take 1 tablet (600 mg total) by mouth every 8 (eight) hours as needed for moderate pain.   Lancets (FREESTYLE) lancets USE AS INSTRUCTED   No current facility-administered medications on file prior to visit.    Allergies:  Allergies  Allergen Reactions   Lisinopril Cough   Semaglutide Nausea And Vomiting   Ultram [Tramadol Hcl] Nausea And Vomiting    Severe vomiting    Victoza [Liraglutide] Nausea And Vomiting    Social History:  Social History   Socioeconomic History   Marital status: Married    Spouse name: Bruce   Number of children: Not on file   Years of education: Not on file   Highest education level: Associate degree: occupational, Scientist, product/process development, or vocational program  Occupational History   Not on file  Tobacco Use   Smoking status: Never   Smokeless tobacco: Never  Vaping Use   Vaping status: Never Used  Substance and Sexual Activity   Alcohol use: Not Currently    Comment: on occasion   Drug use: No   Sexual activity: Never    Birth control/protection: None, Surgical  Other Topics Concern   Not on file  Social History Narrative   Not on file   Social Determinants of Health   Financial Resource Strain: Low Risk  (10/01/2022)   Overall Financial Resource Strain (CARDIA)    Difficulty of Paying Living Expenses: Not hard at all  Food Insecurity: No Food Insecurity (10/01/2022)   Hunger Vital Sign    Worried About Running Out of Food in the Last Year: Never true    Ran Out of Food in the Last Year: Never true  Transportation Needs: No Transportation Needs (10/01/2022)   PRAPARE - Administrator, Civil Service (Medical): No    Lack of Transportation  (Non-Medical): No  Physical Activity: Insufficiently Active (10/01/2022)   Exercise Vital Sign    Days of Exercise per Week: 2 days    Minutes of Exercise per Session: 20 min  Stress: No Stress Concern Present (10/01/2022)   [employer] of Occupational Health - Occupational Stress Questionnaire    Feeling of Stress : Not at all  Social Connections: Socially Integrated (10/01/2022)   Social Connection and Isolation Panel [NHANES]    Frequency of Communication with Friends and Family: More than three times a week    Frequency of Social Gatherings with Friends and Family: More than three times a week    Attends Religious Services: More than 4 times per year    Active Member of [employer] or Organizations: Yes    Attends Banker Meetings: More than 4  times per year    Marital Status: Married  Catering manager Violence: Not on file   Social History   Tobacco Use  Smoking Status Never  Smokeless Tobacco Never   Social History   Substance and Sexual Activity  Alcohol Use Not Currently   Comment: on occasion    Family History:  Family History  Problem Relation Age of Onset   Hypertension Mother    Diabetes Mother    Hypertension Father    Luiz Blare' disease Sister    Clotting disorder Brother    Heart disease Maternal Grandmother    Heart disease Maternal Grandfather    Kidney disease Neg Hx    Breast cancer Neg Hx     Past medical history, surgical history, medications, allergies, family history and social history reviewed with patient today and changes made to appropriate areas of the chart.   Review of Systems  Constitutional: Negative.   HENT: Negative.    Eyes: Negative.   Respiratory: Negative.    Cardiovascular:  Positive for leg swelling. Negative for chest pain, palpitations, orthopnea, claudication and PND.  Gastrointestinal: Negative.   Genitourinary: Negative.   Musculoskeletal: Negative.   Skin: Negative.   Neurological:  Positive for tingling (R  foot and bilateral hands) and weakness. Negative for dizziness, tremors, sensory change, speech change, focal weakness, seizures, loss of consciousness and headaches.  Endo/Heme/Allergies: Negative.   Psychiatric/Behavioral: Negative.     All other ROS negative except what is listed above and in the HPI.      Objective:    BP 119/82   Pulse 92   Temp 98.2 F (36.8 C) (Oral)   Wt 217 lb 6.4 oz (98.6 kg)   LMP  (LMP Unknown)   SpO2 98%   BMI 43.91 kg/m   Wt Readings from Last 3 Encounters:  01/08/23 217 lb 6.4 oz (98.6 kg)  10/01/22 216 lb 12.8 oz (98.3 kg)  09/26/22 220 lb (99.8 kg)    Physical Exam Vitals and nursing note reviewed.  Constitutional:      General: She is not in acute distress.    Appearance: Normal appearance. She is obese. She is not ill-appearing, toxic-appearing or diaphoretic.  HENT:     Head: Normocephalic and atraumatic.     Right Ear: Tympanic membrane, ear canal and external ear normal. There is no impacted cerumen.     Left Ear: Tympanic membrane, ear canal and external ear normal. There is no impacted cerumen.     Nose: Nose normal. No congestion or rhinorrhea.     Mouth/Throat:     Mouth: Mucous membranes are moist.     Pharynx: Oropharynx is clear. No oropharyngeal exudate or posterior oropharyngeal erythema.  Eyes:     General: No scleral icterus.       Right eye: No discharge.        Left eye: No discharge.     Extraocular Movements: Extraocular movements intact.     Conjunctiva/sclera: Conjunctivae normal.     Pupils: Pupils are equal, round, and reactive to light.  Neck:     Vascular: No carotid bruit.  Cardiovascular:     Rate and Rhythm: Normal rate and regular rhythm.     Pulses: Normal pulses.     Heart sounds: No murmur heard.    No friction rub. No gallop.  Pulmonary:     Effort: Pulmonary effort is normal. No respiratory distress.     Breath sounds: Normal breath sounds. No stridor. No wheezing, rhonchi or  rales.  Chest:      Chest wall: No tenderness.  Abdominal:     General: Abdomen is flat. Bowel sounds are normal. There is no distension.     Palpations: Abdomen is soft. There is no mass.     Tenderness: There is no abdominal tenderness. There is no right CVA tenderness, left CVA tenderness, guarding or rebound.     Hernia: No hernia is present.  Genitourinary:    Comments: Breast and pelvic exams deferred with shared decision making Musculoskeletal:        General: No swelling, tenderness, deformity or signs of injury. Normal range of motion.     Cervical back: Normal range of motion and neck supple. No rigidity. No muscular tenderness.     Right lower leg: No edema.     Left lower leg: No edema.  Lymphadenopathy:     Cervical: No cervical adenopathy.  Skin:    General: Skin is warm and dry.     Capillary Refill: Capillary refill takes less than 2 seconds.     Coloration: Skin is not jaundiced or pale.     Findings: No bruising, erythema, lesion or rash.  Neurological:     General: No focal deficit present.     Mental Status: She is alert and oriented to person, place, and time. Mental status is at baseline.     Cranial Nerves: No cranial nerve deficit.     Sensory: No sensory deficit.     Motor: No weakness.     Coordination: Coordination normal.     Gait: Gait normal.     Deep Tendon Reflexes: Reflexes normal.  Psychiatric:        Mood and Affect: Mood normal.        Behavior: Behavior normal.        Thought Content: Thought content normal.        Judgment: Judgment normal.     Results for orders placed or performed in visit on 01/08/23  Microalbumin, Urine Waived  Result Value Ref Range   Microalb, Ur Waived 80 (H) 0 - 19 mg/L   Creatinine, Urine Waived 300 10 - 300 mg/dL   Microalb/Creat Ratio 30-300 (H) <30 mg/g  Urinalysis, Routine w reflex microscopic  Result Value Ref Range   Specific Gravity, UA >1.030 (H) 1.005 - 1.030   pH, UA 5.5 5.0 - 7.5   Color, UA Yellow Yellow    Appearance Ur Cloudy (A) Clear   Leukocytes,UA Negative Negative   Protein,UA Negative Negative/Trace   Glucose, UA Negative Negative   Ketones, UA Negative Negative   RBC, UA Negative Negative   Bilirubin, UA Negative Negative   Urobilinogen, Ur 0.2 0.2 - 1.0 mg/dL   Nitrite, UA Negative Negative   Microscopic Examination Comment   Bayer DCA Hb A1c Waived  Result Value Ref Range   HB A1C (BAYER DCA - WAIVED) 7.6 (H) 4.8 - 5.6 %      Assessment & Plan:   Problem List Items Addressed This Visit       Cardiovascular and Mediastinum   HTN (hypertension)    Under good control on current regimen. Continue current regimen. Continue to monitor. Call with any concerns. Refills given. Labs drawn today.        Relevant Medications   atorvastatin (LIPITOR) 40 MG tablet   losartan (COZAAR) 25 MG tablet     Endocrine   Type 2 diabetes mellitus with hyperglycemia (HCC)    Stable with A1c of 7.6.  Will increase her mounjaro to 10mg  and recheck 3 months. Call with any concerns.       Relevant Medications   tirzepatide (MOUNJARO) 10 MG/0.5ML Pen   atorvastatin (LIPITOR) 40 MG tablet   losartan (COZAAR) 25 MG tablet   metFORMIN (GLUCOPHAGE) 1000 MG tablet   Other Relevant Orders   Microalbumin, Urine Waived (Completed)   Bayer DCA Hb A1c Waived (Completed)   Hyperlipidemia associated with type 2 diabetes mellitus (HCC)    Under good control on current regimen. Continue current regimen. Continue to monitor. Call with any concerns. Refills given. Labs drawn today.       Relevant Medications   tirzepatide (MOUNJARO) 10 MG/0.5ML Pen   atorvastatin (LIPITOR) 40 MG tablet   losartan (COZAAR) 25 MG tablet   metFORMIN (GLUCOPHAGE) 1000 MG tablet     Other   Morbid obesity (HCC)    Congratulated patient on 20lb weight loss. Continue diet and exercise. Call with any concerns. Continue to monitor.       Relevant Medications   tirzepatide (MOUNJARO) 10 MG/0.5ML Pen   metFORMIN  (GLUCOPHAGE) 1000 MG tablet   Anxiety    Under good control on current regimen. Continue current regimen. Continue to monitor. Call with any concerns. Refills given. Labs drawn today.       Relevant Medications   escitalopram (LEXAPRO) 5 MG tablet   traZODone (DESYREL) 50 MG tablet   Other Visit Diagnoses     Routine general medical examination at a health care facility    -  Primary   Vaccines up to date. Screening labs checked today. Pap, mammo and colonoscopy up to date. Continue diet and exercise. Call with any concerns.   Relevant Orders   CBC with Differential/Platelet   Comprehensive metabolic panel   Lipid Panel w/o Chol/HDL Ratio   Urinalysis, Routine w reflex microscopic (Completed)   TSH   Encounter for screening mammogram for malignant neoplasm of breast       Mammogram ordered today.   Relevant Orders   MM 3D SCREENING MAMMOGRAM BILATERAL BREAST        Follow up plan: Return in about 3 months (around 04/10/2023).   LABORATORY TESTING:  - Pap smear: up to date  IMMUNIZATIONS:   - Tdap: Tetanus vaccination status reviewed: last tetanus booster within 10 years. - Influenza: Postponed to flu season - Pneumovax: Up to date - Prevnar: Up to date - COVID: Up to date - HPV: Not applicable - Shingrix vaccine: Administered today  SCREENING: -Mammogram: Up to date  - Colonoscopy: Up to date   PATIENT COUNSELING:   Advised to take 1 mg of folate supplement per day if capable of pregnancy.   Sexuality: Discussed sexually transmitted diseases, partner selection, use of condoms, avoidance of unintended pregnancy  and contraceptive alternatives.   Advised to avoid cigarette smoking.  I discussed with the patient that most people either abstain from alcohol or drink within safe limits (<=14/week and <=4 drinks/occasion for males, <=7/weeks and <= 3 drinks/occasion for females) and that the risk for alcohol disorders and other health effects rises proportionally with  the number of drinks per week and how often a drinker exceeds daily limits.  Discussed cessation/primary prevention of drug use and availability of treatment for abuse.   Diet: Encouraged to adjust caloric intake to maintain  or achieve ideal body weight, to reduce intake of dietary saturated fat and total fat, to limit sodium intake by avoiding high sodium foods and not adding table salt,  and to maintain adequate dietary potassium and calcium preferably from fresh fruits, vegetables, and low-fat dairy products.    stressed the importance of regular exercise  Injury prevention: Discussed safety belts, safety helmets, smoke detector, smoking near bedding or upholstery.   Dental health: Discussed importance of regular tooth brushing, flossing, and dental visits.    NEXT PREVENTATIVE PHYSICAL DUE IN 1 YEAR. Return in about 3 months (around 04/10/2023).

## 2023-01-08 NOTE — Assessment & Plan Note (Signed)
Under good control on current regimen. Continue current regimen. Continue to monitor. Call with any concerns. Refills given. Labs drawn today.   

## 2023-01-08 NOTE — Assessment & Plan Note (Signed)
Stable with A1c of 7.6. Will increase her mounjaro to 10mg  and recheck 3 months. Call with any concerns.

## 2023-01-08 NOTE — Assessment & Plan Note (Signed)
Congratulated patient on 20+ lb weight loss! Continue diet and exercise. Call with any concerns. Continue to monitor.

## 2023-01-09 ENCOUNTER — Other Ambulatory Visit: Payer: Self-pay

## 2023-01-23 ENCOUNTER — Telehealth: Payer: Self-pay | Admitting: Family Medicine

## 2023-01-23 NOTE — Telephone Encounter (Signed)
Diabetic Eye Letter

## 2023-01-24 ENCOUNTER — Other Ambulatory Visit: Payer: Self-pay

## 2023-02-01 LAB — HM DIABETES EYE EXAM

## 2023-02-06 ENCOUNTER — Encounter: Payer: Self-pay | Admitting: Family Medicine

## 2023-02-12 ENCOUNTER — Other Ambulatory Visit: Payer: Self-pay

## 2023-02-15 ENCOUNTER — Other Ambulatory Visit: Payer: Self-pay

## 2023-02-18 ENCOUNTER — Ambulatory Visit
Admission: RE | Admit: 2023-02-18 | Discharge: 2023-02-18 | Disposition: A | Discharge status: Home / Self Care | Payer: Commercial Managed Care - PPO | Source: Ambulatory Visit | Attending: Family Medicine | Admitting: Family Medicine

## 2023-02-18 DIAGNOSIS — Z1231 Encounter for screening mammogram for malignant neoplasm of breast: Secondary | ICD-10-CM | POA: Diagnosis not present

## 2023-03-15 ENCOUNTER — Other Ambulatory Visit: Payer: Self-pay

## 2023-04-03 ENCOUNTER — Other Ambulatory Visit: Payer: Self-pay

## 2023-04-03 DIAGNOSIS — H2000 Unspecified acute and subacute iridocyclitis: Secondary | ICD-10-CM | POA: Diagnosis not present

## 2023-04-03 MED ORDER — CYCLOPENTOLATE HCL 1 % OP SOLN
1.0000 [drp] | Freq: Three times a day (TID) | OPHTHALMIC | 1 refills | Status: DC
  Filled 2023-04-03: qty 2, 14d supply, fill #0
  Filled 2023-05-01: qty 2, 14d supply, fill #1

## 2023-04-03 MED ORDER — PREDNISOLONE ACETATE 1 % OP SUSP
1.0000 [drp] | Freq: Four times a day (QID) | OPHTHALMIC | 0 refills | Status: DC
  Filled 2023-04-03: qty 10, 50d supply, fill #0
  Filled 2023-04-16 – 2023-05-31 (×4): qty 5, 25d supply, fill #1

## 2023-04-10 ENCOUNTER — Ambulatory Visit: Payer: Commercial Managed Care - PPO | Admitting: Family Medicine

## 2023-04-10 VITALS — BP 138/84 | HR 70 | Ht 59.0 in | Wt 211.6 lb

## 2023-04-10 DIAGNOSIS — H2000 Unspecified acute and subacute iridocyclitis: Secondary | ICD-10-CM | POA: Diagnosis not present

## 2023-04-10 DIAGNOSIS — Z23 Encounter for immunization: Secondary | ICD-10-CM | POA: Diagnosis not present

## 2023-04-10 DIAGNOSIS — E1165 Type 2 diabetes mellitus with hyperglycemia: Secondary | ICD-10-CM | POA: Diagnosis not present

## 2023-04-10 DIAGNOSIS — E118 Type 2 diabetes mellitus with unspecified complications: Secondary | ICD-10-CM | POA: Diagnosis not present

## 2023-04-10 LAB — BAYER DCA HB A1C WAIVED: HB A1C (BAYER DCA - WAIVED): 6.5 % — ABNORMAL HIGH (ref 4.8–5.6)

## 2023-04-10 NOTE — Progress Notes (Signed)
BP 138/84   Pulse 70   Ht 4\' 11"  (1.499 m)   Wt 211 lb 9.6 oz (96 kg)   LMP  (LMP Unknown)   SpO2 99%   BMI 42.74 kg/m    Subjective:    Patient ID: Tricia Hill, female    DOB: 1971/03/25, 52 y.o.   MRN: [id number]  HPI: Tricia Hill is a 52 y.o. female  Chief Complaint  Patient presents with   Diabetes   DIABETES Hypoglycemic episodes:no Polydipsia/polyuria: no Visual disturbance: no Chest pain: no Paresthesias: no Glucose Monitoring: yes  Accucheck frequency: BID Taking Insulin?: no Blood Pressure Monitoring: not checking Retinal Examination: Up to Date Foot Exam: Up to Date Diabetic Education: Completed Pneumovax: Up to Date Influenza: Up to Date Aspirin: no  Relevant past medical, surgical, family and social history reviewed and updated as indicated. Interim medical history since our last visit reviewed. Allergies and medications reviewed and updated.  Review of Systems  Constitutional: Negative.   Respiratory: Negative.    Cardiovascular: Negative.   Gastrointestinal: Negative.   Musculoskeletal: Negative.   Psychiatric/Behavioral: Negative.      Per HPI unless specifically indicated above     Objective:    BP 138/84   Pulse 70   Ht 4\' 11"  (1.499 m)   Wt 211 lb 9.6 oz (96 kg)   LMP  (LMP Unknown)   SpO2 99%   BMI 42.74 kg/m   Wt Readings from Last 3 Encounters:  04/10/23 211 lb 9.6 oz (96 kg)  01/08/23 217 lb 6.4 oz (98.6 kg)  10/01/22 216 lb 12.8 oz (98.3 kg)    Physical Exam Vitals and nursing note reviewed.  Constitutional:      General: She is not in acute distress.    Appearance: Normal appearance. She is not ill-appearing, toxic-appearing or diaphoretic.  HENT:     Head: Normocephalic and atraumatic.     Right Ear: External ear normal.     Left Ear: External ear normal.     Nose: Nose normal.     Mouth/Throat:     Mouth: Mucous membranes are moist.     Pharynx: Oropharynx is clear.  Eyes:     General: No scleral  icterus.       Right eye: No discharge.        Left eye: No discharge.     Extraocular Movements: Extraocular movements intact.     Conjunctiva/sclera: Conjunctivae normal.     Pupils: Pupils are equal, round, and reactive to light.  Cardiovascular:     Rate and Rhythm: Normal rate and regular rhythm.     Pulses: Normal pulses.     Heart sounds: Normal heart sounds. No murmur heard.    No friction rub. No gallop.  Pulmonary:     Effort: Pulmonary effort is normal. No respiratory distress.     Breath sounds: Normal breath sounds. No stridor. No wheezing, rhonchi or rales.  Chest:     Chest wall: No tenderness.  Musculoskeletal:        General: Normal range of motion.     Cervical back: Normal range of motion and neck supple.  Skin:    General: Skin is warm and dry.     Capillary Refill: Capillary refill takes less than 2 seconds.     Coloration: Skin is not jaundiced or pale.     Findings: No bruising, erythema, lesion or rash.  Neurological:     General: No focal deficit present.  Mental Status: She is alert and oriented to person, place, and time. Mental status is at baseline.  Psychiatric:        Mood and Affect: Mood normal.        Behavior: Behavior normal.        Thought Content: Thought content normal.        Judgment: Judgment normal.     Results for orders placed or performed in visit on 02/06/23  HM DIABETES EYE EXAM  Result Value Ref Range   HM Diabetic Eye Exam No Retinopathy No Retinopathy      Assessment & Plan:   Problem List Items Addressed This Visit       Endocrine   Controlled diabetes mellitus type 2 with complications (HCC) - Primary    Doing fantastic with A1c of 6.5! Continue current regimen. Continue to monitor. Recheck 3 months. Call with any concerns.       Relevant Orders   Bayer DCA Hb A1c Waived     Follow up plan: Return in about 3 months (around 07/11/2023).

## 2023-04-10 NOTE — Assessment & Plan Note (Signed)
Doing fantastic with A1c of 6.5! Continue current regimen. Continue to monitor. Recheck 3 months. Call with any concerns.

## 2023-04-16 ENCOUNTER — Other Ambulatory Visit: Payer: Self-pay

## 2023-04-16 DIAGNOSIS — M79671 Pain in right foot: Secondary | ICD-10-CM | POA: Diagnosis not present

## 2023-04-16 MED ORDER — HYDROCODONE-ACETAMINOPHEN 5-325 MG PO TABS
0.5000 | ORAL_TABLET | Freq: Four times a day (QID) | ORAL | 0 refills | Status: DC | PRN
  Filled 2023-04-16: qty 12, 3d supply, fill #0

## 2023-04-16 MED ORDER — PREDNISONE 5 MG PO TABS
ORAL_TABLET | ORAL | 0 refills | Status: AC
  Filled 2023-04-16: qty 21, 6d supply, fill #0

## 2023-04-17 DIAGNOSIS — M79671 Pain in right foot: Secondary | ICD-10-CM | POA: Diagnosis not present

## 2023-04-17 DIAGNOSIS — G5791 Unspecified mononeuropathy of right lower limb: Secondary | ICD-10-CM | POA: Diagnosis not present

## 2023-04-17 DIAGNOSIS — E119 Type 2 diabetes mellitus without complications: Secondary | ICD-10-CM | POA: Diagnosis not present

## 2023-04-17 DIAGNOSIS — G8929 Other chronic pain: Secondary | ICD-10-CM | POA: Diagnosis not present

## 2023-04-17 DIAGNOSIS — M545 Low back pain, unspecified: Secondary | ICD-10-CM | POA: Diagnosis not present

## 2023-05-01 ENCOUNTER — Other Ambulatory Visit: Payer: Self-pay

## 2023-05-03 ENCOUNTER — Other Ambulatory Visit: Payer: Self-pay

## 2023-05-03 DIAGNOSIS — H2000 Unspecified acute and subacute iridocyclitis: Secondary | ICD-10-CM | POA: Diagnosis not present

## 2023-05-03 MED ORDER — DIFLUPREDNATE 0.05 % OP EMUL
1.0000 [drp] | Freq: Four times a day (QID) | OPHTHALMIC | 0 refills | Status: DC
  Filled 2023-05-03: qty 5, 25d supply, fill #0

## 2023-05-03 MED ORDER — KETOROLAC TROMETHAMINE 0.5 % OP SOLN
1.0000 [drp] | Freq: Four times a day (QID) | OPHTHALMIC | 0 refills | Status: DC
  Filled 2023-05-03 (×2): qty 5, 25d supply, fill #0

## 2023-05-07 ENCOUNTER — Other Ambulatory Visit: Payer: Self-pay

## 2023-05-07 ENCOUNTER — Other Ambulatory Visit (HOSPITAL_COMMUNITY): Payer: Self-pay

## 2023-05-07 DIAGNOSIS — H2012 Chronic iridocyclitis, left eye: Secondary | ICD-10-CM | POA: Diagnosis not present

## 2023-05-07 DIAGNOSIS — H35352 Cystoid macular degeneration, left eye: Secondary | ICD-10-CM | POA: Diagnosis not present

## 2023-05-07 MED ORDER — DORZOLAMIDE HCL-TIMOLOL MAL 2-0.5 % OP SOLN
1.0000 [drp] | Freq: Four times a day (QID) | OPHTHALMIC | 11 refills | Status: DC
  Filled 2023-05-07: qty 10, 50d supply, fill #0
  Filled 2023-07-10: qty 10, 50d supply, fill #1
  Filled 2023-08-29 – 2023-10-02 (×3): qty 10, 50d supply, fill #2

## 2023-05-23 DIAGNOSIS — M47816 Spondylosis without myelopathy or radiculopathy, lumbar region: Secondary | ICD-10-CM | POA: Diagnosis not present

## 2023-05-23 DIAGNOSIS — R200 Anesthesia of skin: Secondary | ICD-10-CM | POA: Diagnosis not present

## 2023-05-23 DIAGNOSIS — M4802 Spinal stenosis, cervical region: Secondary | ICD-10-CM | POA: Diagnosis not present

## 2023-05-23 DIAGNOSIS — M79671 Pain in right foot: Secondary | ICD-10-CM | POA: Diagnosis not present

## 2023-05-23 DIAGNOSIS — G8929 Other chronic pain: Secondary | ICD-10-CM | POA: Diagnosis not present

## 2023-05-27 ENCOUNTER — Other Ambulatory Visit: Payer: Self-pay

## 2023-05-27 DIAGNOSIS — H2012 Chronic iridocyclitis, left eye: Secondary | ICD-10-CM | POA: Diagnosis not present

## 2023-05-27 DIAGNOSIS — H35352 Cystoid macular degeneration, left eye: Secondary | ICD-10-CM | POA: Diagnosis not present

## 2023-05-27 MED ORDER — DORZOLAMIDE HCL-TIMOLOL MAL 2-0.5 % OP SOLN
1.0000 [drp] | Freq: Four times a day (QID) | OPHTHALMIC | 11 refills | Status: DC
  Filled 2023-05-27: qty 10, 50d supply, fill #0

## 2023-05-27 MED ORDER — KETOROLAC TROMETHAMINE 0.5 % OP SOLN
1.0000 [drp] | Freq: Four times a day (QID) | OPHTHALMIC | 6 refills | Status: DC
  Filled 2023-05-27: qty 5, 25d supply, fill #0
  Filled 2023-07-10: qty 5, 25d supply, fill #1
  Filled 2023-08-21: qty 5, 25d supply, fill #2
  Filled 2023-09-10 – 2023-10-02 (×2): qty 5, 25d supply, fill #3

## 2023-05-27 MED ORDER — DIFLUPREDNATE 0.05 % OP EMUL
1.0000 [drp] | Freq: Four times a day (QID) | OPHTHALMIC | 6 refills | Status: DC
  Filled 2023-05-27: qty 5, 25d supply, fill #0
  Filled 2023-07-10: qty 5, 25d supply, fill #1
  Filled 2023-08-21: qty 5, 25d supply, fill #2
  Filled 2023-09-10 – 2023-10-02 (×2): qty 5, 25d supply, fill #3

## 2023-05-28 DIAGNOSIS — M79671 Pain in right foot: Secondary | ICD-10-CM | POA: Insufficient documentation

## 2023-05-28 DIAGNOSIS — R200 Anesthesia of skin: Secondary | ICD-10-CM | POA: Insufficient documentation

## 2023-05-31 ENCOUNTER — Other Ambulatory Visit: Payer: Self-pay

## 2023-06-03 DIAGNOSIS — H35352 Cystoid macular degeneration, left eye: Secondary | ICD-10-CM | POA: Diagnosis not present

## 2023-06-03 DIAGNOSIS — H47099 Other disorders of optic nerve, not elsewhere classified, unspecified eye: Secondary | ICD-10-CM | POA: Diagnosis not present

## 2023-06-03 DIAGNOSIS — H2012 Chronic iridocyclitis, left eye: Secondary | ICD-10-CM | POA: Diagnosis not present

## 2023-06-03 DIAGNOSIS — H3022 Posterior cyclitis, left eye: Secondary | ICD-10-CM | POA: Diagnosis not present

## 2023-06-10 ENCOUNTER — Other Ambulatory Visit: Payer: Self-pay | Admitting: Family Medicine

## 2023-06-10 ENCOUNTER — Other Ambulatory Visit: Payer: Self-pay

## 2023-06-11 ENCOUNTER — Other Ambulatory Visit: Payer: Self-pay

## 2023-06-14 ENCOUNTER — Other Ambulatory Visit: Payer: Self-pay

## 2023-06-14 MED FILL — Glucose Blood Test Strip: 90 days supply | Qty: 100 | Fill #0 | Status: AC

## 2023-06-14 MED FILL — Lancets: 90 days supply | Qty: 100 | Fill #0 | Status: AC

## 2023-06-14 NOTE — Telephone Encounter (Signed)
 Requested Prescriptions  Pending Prescriptions Disp Refills   FREESTYLE LITE test strip [Pharmacy Med Name: glucose blood (FREESTYLE LITE) test strip] 100 strip 0    Sig: USE AS DIRECTED     Endocrinology: Diabetes - Testing Supplies Passed - 06/14/2023  3:18 PM      Passed - Valid encounter within last 12 months    Recent Outpatient Visits           2 months ago Controlled type 2 diabetes mellitus with complication, without long-term current use of insulin  (HCC)   [place] [facility] [place], Megan P, DO   5 months ago Routine general medical examination at a health care facility   [facility], [place] P, DO   8 months ago Type 2 diabetes mellitus with hyperglycemia, without long-term current use of insulin  [facility])   [place] [facility] [place], Megan P, DO   1 year ago Type 2 diabetes mellitus with hyperglycemia, without long-term current use of insulin  [facility])   [place] [facility] [place], Megan P, DO   1 year ago Type 2 diabetes mellitus with hyperglycemia, without long-term current use of insulin  [facility])   [place] [facility] [place], Duwaine SQUIBB, DO       Future Appointments             In 3 weeks Vicci, Duwaine SQUIBB, DO [place] [facility]   In 3 months Stoioff, Glendia BROCKS, MD [facility] [place]             Lancets (FREESTYLE) lancets 100 each 0    Sig: USE AS INSTRUCTED     Endocrinology: Diabetes - Testing Supplies Passed - 06/14/2023  3:18 PM      Passed - Valid encounter within last 12 months    Recent Outpatient Visits           2 months ago Controlled type 2 diabetes mellitus with complication, without long-term current use of insulin  [facility])   [place] [facility] [place], Megan P, DO   5 months ago Routine general medical examination at a health care facility   [facility], [place] P, DO   8 months  ago Type 2 diabetes mellitus with hyperglycemia, without long-term current use of insulin  [facility])   [place] [facility] [place], Megan P, DO   1 year ago Type 2 diabetes mellitus with hyperglycemia, without long-term current use of insulin  [facility])   [place] [facility] [place], Megan P, DO   1 year ago Type 2 diabetes mellitus with hyperglycemia, without long-term current use of insulin  [facility])   [place] [facility] [place], Duwaine SQUIBB, DO       Future Appointments             In 3 weeks Vicci, Duwaine SQUIBB, DO [place] [facility]   In 3 months Stoioff, Glendia BROCKS, MD [facility] [place]

## 2023-06-16 ENCOUNTER — Other Ambulatory Visit: Payer: Self-pay

## 2023-06-28 DIAGNOSIS — H2012 Chronic iridocyclitis, left eye: Secondary | ICD-10-CM | POA: Diagnosis not present

## 2023-06-28 DIAGNOSIS — H35352 Cystoid macular degeneration, left eye: Secondary | ICD-10-CM | POA: Diagnosis not present

## 2023-07-06 DIAGNOSIS — M5412 Radiculopathy, cervical region: Secondary | ICD-10-CM | POA: Diagnosis not present

## 2023-07-06 DIAGNOSIS — G5601 Carpal tunnel syndrome, right upper limb: Secondary | ICD-10-CM | POA: Diagnosis not present

## 2023-07-09 DIAGNOSIS — H35352 Cystoid macular degeneration, left eye: Secondary | ICD-10-CM | POA: Diagnosis not present

## 2023-07-09 DIAGNOSIS — H2012 Chronic iridocyclitis, left eye: Secondary | ICD-10-CM | POA: Diagnosis not present

## 2023-07-09 DIAGNOSIS — H3022 Posterior cyclitis, left eye: Secondary | ICD-10-CM | POA: Diagnosis not present

## 2023-07-11 ENCOUNTER — Other Ambulatory Visit: Payer: Self-pay

## 2023-07-11 ENCOUNTER — Telehealth: Payer: Self-pay

## 2023-07-11 ENCOUNTER — Ambulatory Visit: Payer: Commercial Managed Care - PPO | Admitting: Family Medicine

## 2023-07-11 VITALS — BP 123/79 | HR 69 | Temp 98.3°F | Wt 214.2 lb

## 2023-07-11 DIAGNOSIS — Z7985 Long-term (current) use of injectable non-insulin antidiabetic drugs: Secondary | ICD-10-CM | POA: Diagnosis not present

## 2023-07-11 DIAGNOSIS — I10 Essential (primary) hypertension: Secondary | ICD-10-CM | POA: Diagnosis not present

## 2023-07-11 DIAGNOSIS — F419 Anxiety disorder, unspecified: Secondary | ICD-10-CM

## 2023-07-11 DIAGNOSIS — G4733 Obstructive sleep apnea (adult) (pediatric): Secondary | ICD-10-CM

## 2023-07-11 DIAGNOSIS — E785 Hyperlipidemia, unspecified: Secondary | ICD-10-CM | POA: Diagnosis not present

## 2023-07-11 DIAGNOSIS — H209 Unspecified iridocyclitis: Secondary | ICD-10-CM

## 2023-07-11 DIAGNOSIS — E1169 Type 2 diabetes mellitus with other specified complication: Secondary | ICD-10-CM

## 2023-07-11 DIAGNOSIS — E118 Type 2 diabetes mellitus with unspecified complications: Secondary | ICD-10-CM

## 2023-07-11 LAB — BAYER DCA HB A1C WAIVED: HB A1C (BAYER DCA - WAIVED): 5.9 % — ABNORMAL HIGH (ref 4.8–5.6)

## 2023-07-11 MED ORDER — ESCITALOPRAM OXALATE 5 MG PO TABS: 2.5000 mg | ORAL_TABLET | Freq: Every day | ORAL | Status: DC

## 2023-07-11 MED ORDER — TIRZEPATIDE 10 MG/0.5ML ~~LOC~~ SOAJ
10.0000 mg | SUBCUTANEOUS | 1 refills | Status: DC
  Filled 2023-07-12: qty 6, 84d supply, fill #0
  Filled 2023-10-02: qty 6, 84d supply, fill #1

## 2023-07-11 MED ORDER — TRAZODONE HCL 50 MG PO TABS
25.0000 mg | ORAL_TABLET | Freq: Every evening | ORAL | 1 refills | Status: DC | PRN
  Filled 2023-07-11 – 2023-10-02 (×2): qty 90, 90d supply, fill #0
  Filled 2024-01-31: qty 90, 90d supply, fill #1

## 2023-07-11 MED ORDER — LOSARTAN POTASSIUM 25 MG PO TABS
12.5000 mg | ORAL_TABLET | Freq: Every day | ORAL | 1 refills | Status: DC
  Filled 2023-07-11 – 2023-10-02 (×2): qty 45, 90d supply, fill #0
  Filled 2024-01-17 – 2024-01-31 (×2): qty 45, 90d supply, fill #1

## 2023-07-11 MED ORDER — METFORMIN HCL 1000 MG PO TABS
1000.0000 mg | ORAL_TABLET | Freq: Two times a day (BID) | ORAL | 1 refills | Status: DC
  Filled 2023-07-11 – 2023-10-02 (×2): qty 180, 90d supply, fill #0
  Filled 2024-01-17 – 2024-01-31 (×2): qty 180, 90d supply, fill #1

## 2023-07-11 MED ORDER — ESCITALOPRAM OXALATE 5 MG PO TABS
2.5000 mg | ORAL_TABLET | Freq: Every day | ORAL | 1 refills | Status: DC
  Filled 2023-07-11: qty 90, 180d supply, fill #0

## 2023-07-11 MED ORDER — GABAPENTIN 100 MG PO CAPS
100.0000 mg | ORAL_CAPSULE | Freq: Three times a day (TID) | ORAL | 1 refills | Status: DC
  Filled 2023-07-11 – 2023-12-16 (×3): qty 270, 90d supply, fill #0

## 2023-07-11 NOTE — Progress Notes (Signed)
BP 123/79   Pulse 69   Temp 98.3 F (36.8 C) (Oral)   Wt 214 lb 3.2 oz (97.2 kg)   LMP  (LMP Unknown)   SpO2 96%   BMI 43.26 kg/m    Subjective:    Patient ID: Tricia Hill, female    DOB: 1970/11/25, 53 y.o.   MRN: [id number]  HPI: Tricia Hill is a 53 y.o. female  Chief Complaint  Patient presents with   CPAP Supplies    Patient says she needs an update CPAP machine order as it has been a disaster trying to get her CPAP supplies and it has caused her not to be able to use in the past few months. Patient says she has been using "Feeling Great" and it has not been the best.    DIABETES Hypoglycemic episodes:no Polydipsia/polyuria: no Visual disturbance: no Chest pain: no Paresthesias: yes Glucose Monitoring: no Taking Insulin?: no Blood Pressure Monitoring: not checking Retinal Examination: Up to Date Foot Exam: Up to Date Diabetic Education: Completed Pneumovax: Up to Date Influenza: Up to Date Aspirin: no  HYPERTENSION / HYPERLIPIDEMIA Satisfied with current treatment? yes Duration of hypertension: chronic BP monitoring frequency: not checking BP medication side effects: no Past BP meds: losartan Duration of hyperlipidemia: chronic Cholesterol medication side effects: no Cholesterol supplements: none Past cholesterol medications: atorvastatin Medication compliance: excellent compliance Aspirin: no Recent stressors: no Recurrent headaches: no Visual changes: no Palpitations: no Dyspnea: no Chest pain: no Lower extremity edema: no Dizzy/lightheaded: no  SLEEP APNEA Sleep apnea status: uncontrolled Duration: chronic Satisfied with current treatment?:  no CPAP use:  does not have supplies so hasn't been able to use it Sleep quality with CPAP use: excellent Treament compliance: has not been able to get supplies so has not been using it in the past 2 months Last sleep study:  Wakes feeling refreshed:  no Daytime hypersomnolence:   yes Fatigue:  yes Insomnia:  yes Good sleep hygiene:  yes Difficulty falling asleep:  no Difficulty staying asleep:  no Snoring bothers bed partner:  yes Observed apnea by bed partner: yes Obesity:  yes Hypertension: yes  Pulmonary hypertension:  no Coronary artery disease:  no  DEPRESSION Mood status: better Satisfied with current treatment?: yes Symptom severity: mild  Duration of current treatment : chronic Side effects: no Medication compliance: excellent compliance Psychotherapy/counseling: no  Previous psychiatric medications: lexapro Depressed mood: no Anxious mood: no Anhedonia: no Significant weight loss or gain: no Insomnia: no  Fatigue: no Feelings of worthlessness or guilt: no Impaired concentration/indecisiveness: no Suicidal ideations: no Hopelessness: no Crying spells: no    07/11/2023   10:07 AM 04/10/2023    8:57 AM 10/01/2022    2:05 PM 06/13/2022   10:06 AM 03/09/2022    8:50 AM  Depression screen PHQ 2/9  Decreased Interest 0 0 0 1 0  Down, Depressed, Hopeless 0 0 0 0 0  PHQ - 2 Score 0 0 0 1 0  Altered sleeping 1  2 1 2   Tired, decreased energy 1  2 1 1   Change in appetite 0  0 0 1  Feeling bad or failure about yourself  0  0 0 0  Trouble concentrating 0  0 0 0  Moving slowly or fidgety/restless 0  0 0 0  Suicidal thoughts 0  0 0 0  PHQ-9 Score 2  4 3 4   Difficult doing work/chores Not difficult at all   Not difficult at all Somewhat difficult  Relevant past medical, surgical, family and social history reviewed and updated as indicated. Interim medical history since our last visit reviewed. Allergies and medications reviewed and updated.  Review of Systems  Constitutional: Negative.   Respiratory: Negative.    Cardiovascular: Negative.   Gastrointestinal: Negative.   Musculoskeletal: Negative.   Neurological:  Positive for numbness. Negative for dizziness, tremors, seizures, syncope, facial asymmetry, speech difficulty, weakness,  light-headedness and headaches.  Psychiatric/Behavioral: Negative.      Per HPI unless specifically indicated above     Objective:    BP 123/79   Pulse 69   Temp 98.3 F (36.8 C) (Oral)   Wt 214 lb 3.2 oz (97.2 kg)   LMP  (LMP Unknown)   SpO2 96%   BMI 43.26 kg/m   Wt Readings from Last 3 Encounters:  07/11/23 214 lb 3.2 oz (97.2 kg)  04/10/23 211 lb 9.6 oz (96 kg)  01/08/23 217 lb 6.4 oz (98.6 kg)    Physical Exam Vitals and nursing note reviewed.  Constitutional:      General: She is not in acute distress.    Appearance: Normal appearance. She is not ill-appearing, toxic-appearing or diaphoretic.  HENT:     Head: Normocephalic and atraumatic.     Right Ear: External ear normal.     Left Ear: External ear normal.     Nose: Nose normal.     Mouth/Throat:     Mouth: Mucous membranes are moist.     Pharynx: Oropharynx is clear.  Eyes:     General: No scleral icterus.       Right eye: No discharge.        Left eye: No discharge.     Extraocular Movements: Extraocular movements intact.     Conjunctiva/sclera: Conjunctivae normal.     Pupils: Pupils are equal, round, and reactive to light.  Cardiovascular:     Rate and Rhythm: Normal rate and regular rhythm.     Pulses: Normal pulses.     Heart sounds: Normal heart sounds. No murmur heard.    No friction rub. No gallop.  Pulmonary:     Effort: Pulmonary effort is normal. No respiratory distress.     Breath sounds: Normal breath sounds. No stridor. No wheezing, rhonchi or rales.  Chest:     Chest wall: No tenderness.  Musculoskeletal:        General: Normal range of motion.     Cervical back: Normal range of motion and neck supple.  Skin:    General: Skin is warm and dry.     Capillary Refill: Capillary refill takes less than 2 seconds.     Coloration: Skin is not jaundiced or pale.     Findings: No bruising, erythema, lesion or rash.  Neurological:     General: No focal deficit present.     Mental Status: She  is alert and oriented to person, place, and time. Mental status is at baseline.  Psychiatric:        Mood and Affect: Mood normal.        Behavior: Behavior normal.        Thought Content: Thought content normal.        Judgment: Judgment normal.     Results for orders placed or performed in visit on 04/10/23  Bayer DCA Hb A1c Waived   Collection Time: 04/10/23  8:50 AM  Result Value Ref Range   HB A1C (BAYER DCA - WAIVED) 6.5 (H) 4.8 - 5.6 %  Assessment & Plan:   Problem List Items Addressed This Visit       Cardiovascular and Mediastinum   HTN (hypertension)   Under good control on current regimen. Continue current regimen. Continue to monitor. Call with any concerns. Refills given. Labs drawn today.      Relevant Medications   losartan (COZAAR) 25 MG tablet   Other Relevant Orders   CBC with Differential/Platelet   Comprehensive metabolic panel     Respiratory   OSA (obstructive sleep apnea)   Needs new supplies. Will order for her. Call with any concerns.         Endocrine   Controlled diabetes mellitus type 2 with complications (HCC) - Primary   Doing amazing with A1c of 5.9! Continue diet and exercise. Continue current regimen. Call with any concerns.        Relevant Medications   losartan (COZAAR) 25 MG tablet   metFORMIN (GLUCOPHAGE) 1000 MG tablet   tirzepatide (MOUNJARO) 10 MG/0.5ML Pen   Other Relevant Orders   Bayer DCA Hb A1c Waived   CBC with Differential/Platelet   Comprehensive metabolic panel   Hyperlipidemia associated with type 2 diabetes mellitus (HCC)   Under good control on current regimen. Continue current regimen. Continue to monitor. Call with any concerns. Refills given. Labs drawn today.       Relevant Medications   losartan (COZAAR) 25 MG tablet   metFORMIN (GLUCOPHAGE) 1000 MG tablet   tirzepatide (MOUNJARO) 10 MG/0.5ML Pen   Other Relevant Orders   CBC with Differential/Platelet   Comprehensive metabolic panel   Lipid  Panel w/o Chol/HDL Ratio     Other   Anxiety   Doing great. Will stop lexapro. Call with any concerns.       Relevant Medications   escitalopram (LEXAPRO) 5 MG tablet   traZODone (DESYREL) 50 MG tablet   Uveitis   Following with ophthalmology. Call with any concerns.         Follow up plan: Return in about 3 months (around 10/09/2023).

## 2023-07-11 NOTE — Assessment & Plan Note (Signed)
Doing great. Will stop lexapro. Call with any concerns.

## 2023-07-11 NOTE — Telephone Encounter (Signed)
Parachute order has been placed by [place], CMA and has been sent to provider via email for signature. Please advise?

## 2023-07-11 NOTE — Assessment & Plan Note (Signed)
Under good control on current regimen. Continue current regimen. Continue to monitor. Call with any concerns. Refills given. Labs drawn today.

## 2023-07-11 NOTE — Assessment & Plan Note (Signed)
Following with ophthalmology. Call with any concerns.

## 2023-07-11 NOTE — Assessment & Plan Note (Signed)
Doing amazing with A1c of 5.9! Continue diet and exercise. Continue current regimen. Call with any concerns.

## 2023-07-11 NOTE — Telephone Encounter (Signed)
-----   Message from [facility] sent at 07/11/2023 10:18 AM EST ----- CPAP supplies order please

## 2023-07-11 NOTE — Assessment & Plan Note (Signed)
Needs new supplies. Will order for her. Call with any concerns.

## 2023-07-12 ENCOUNTER — Other Ambulatory Visit: Payer: Self-pay

## 2023-07-12 ENCOUNTER — Encounter: Payer: Self-pay | Admitting: Family Medicine

## 2023-07-12 LAB — CBC WITH DIFFERENTIAL/PLATELET
Basophils Absolute: 0.1 10*3/uL (ref 0.0–0.2)
Basos: 1 %
EOS (ABSOLUTE): 0.2 10*3/uL (ref 0.0–0.4)
Eos: 2 %
Hematocrit: 40 % (ref 34.0–46.6)
Hemoglobin: 13 g/dL (ref 11.1–15.9)
Immature Grans (Abs): 0 10*3/uL (ref 0.0–0.1)
Immature Granulocytes: 0 %
Lymphocytes Absolute: 2 10*3/uL (ref 0.7–3.1)
Lymphs: 22 %
MCH: 28.6 pg (ref 26.6–33.0)
MCHC: 32.5 g/dL (ref 31.5–35.7)
MCV: 88 fL (ref 79–97)
Monocytes Absolute: 0.5 10*3/uL (ref 0.1–0.9)
Monocytes: 5 %
Neutrophils Absolute: 6.2 10*3/uL (ref 1.4–7.0)
Neutrophils: 70 %
Platelets: 325 10*3/uL (ref 150–450)
RBC: 4.54 x10E6/uL (ref 3.77–5.28)
RDW: 13.9 % (ref 11.7–15.4)
WBC: 9 10*3/uL (ref 3.4–10.8)

## 2023-07-12 LAB — COMPREHENSIVE METABOLIC PANEL
ALT: 18 [IU]/L (ref 0–32)
AST: 16 [IU]/L (ref 0–40)
Albumin: 4.3 g/dL (ref 3.8–4.9)
Alkaline Phosphatase: 120 [IU]/L (ref 44–121)
BUN/Creatinine Ratio: 17 (ref 9–23)
BUN: 16 mg/dL (ref 6–24)
Bilirubin Total: 0.3 mg/dL (ref 0.0–1.2)
CO2: 25 mmol/L (ref 20–29)
Calcium: 9.7 mg/dL (ref 8.7–10.2)
Chloride: 100 mmol/L (ref 96–106)
Creatinine, Ser: 0.96 mg/dL (ref 0.57–1.00)
Globulin, Total: 2.7 g/dL (ref 1.5–4.5)
Glucose: 130 mg/dL — ABNORMAL HIGH (ref 70–99)
Potassium: 3.6 mmol/L (ref 3.5–5.2)
Sodium: 142 mmol/L (ref 134–144)
Total Protein: 7 g/dL (ref 6.0–8.5)
eGFR: 72 mL/min/{1.73_m2} (ref >59)

## 2023-07-12 LAB — LIPID PANEL W/O CHOL/HDL RATIO
Cholesterol, Total: 237 mg/dL — ABNORMAL HIGH (ref 100–199)
HDL: 52 mg/dL (ref >39)
LDL Chol Calc (NIH): 157 mg/dL — ABNORMAL HIGH (ref 0–99)
Triglycerides: 155 mg/dL — ABNORMAL HIGH (ref 0–149)
VLDL Cholesterol Cal: 28 mg/dL (ref 5–40)

## 2023-07-12 NOTE — Telephone Encounter (Signed)
Order has been sent to your email just now.

## 2023-07-12 NOTE — Telephone Encounter (Signed)
 signed

## 2023-07-15 DIAGNOSIS — H3022 Posterior cyclitis, left eye: Secondary | ICD-10-CM | POA: Diagnosis not present

## 2023-07-15 DIAGNOSIS — R911 Solitary pulmonary nodule: Secondary | ICD-10-CM | POA: Diagnosis not present

## 2023-07-15 DIAGNOSIS — H35352 Cystoid macular degeneration, left eye: Secondary | ICD-10-CM | POA: Diagnosis not present

## 2023-07-23 ENCOUNTER — Telehealth: Payer: Self-pay

## 2023-07-23 NOTE — Telephone Encounter (Signed)
I'll take care of this, thanks [facility]

## 2023-07-23 NOTE — Progress Notes (Signed)
   07/23/2023  Patient ID: Tricia Hill, female   DOB: 1970-08-29, 52 y.o.   MRN: [id number]  Incoming call from [place] with [employer] stating they are needing documentation of face to face notes prior to patient's sleep study and/or use/benefit notes showing benefit from use of machine.  They will also need diagnostic sleep study results showing OSA and AHI in order to fulfill orders recently sent.  This can be faxed to [phone].  Forwarding to patient's PCP and clinic staff to complete.  Lenna Gilford, PharmD, DPLA

## 2023-08-01 DIAGNOSIS — M5416 Radiculopathy, lumbar region: Secondary | ICD-10-CM | POA: Diagnosis not present

## 2023-08-02 ENCOUNTER — Ambulatory Visit: Payer: Commercial Managed Care - PPO | Admitting: Family Medicine

## 2023-08-02 ENCOUNTER — Encounter: Payer: Self-pay | Admitting: Family Medicine

## 2023-08-02 VITALS — BP 132/77 | HR 86 | Temp 98.4°F | Ht 59.0 in | Wt 217.8 lb

## 2023-08-02 DIAGNOSIS — G4733 Obstructive sleep apnea (adult) (pediatric): Secondary | ICD-10-CM | POA: Diagnosis not present

## 2023-08-02 NOTE — Assessment & Plan Note (Signed)
Has had OSA since 2009, well controlled on CPAP. Needs new supplies, but has been having difficulty getting them from her supply company. We will check in and get her new supplies. Will order new home sleep study if needed.

## 2023-08-02 NOTE — Progress Notes (Signed)
BP 132/77 (BP Location: Right Arm, Patient Position: Sitting, Cuff Size: Normal)   Pulse 86   Temp 98.4 F (36.9 C) (Oral)   Ht 4\' 11"  (1.499 m)   Wt 217 lb 12.8 oz (98.8 kg)   LMP  (LMP Unknown)   SpO2 98%   BMI 43.99 kg/m    Subjective:    Patient ID: Tricia Hill, female    DOB: 10-09-1970, 53 y.o.   MRN: [id number]  HPI: Tricia Hill is a 52 y.o. female  Chief Complaint  Patient presents with   Obstructive Sleep Apnea    PT states The company trying to reach out but can't get in touch with [place] She a home sleep study   SLEEP APNEA Sleep apnea status: uncontrolled- machine is still working well, but the supplies have worn out and she needs Duration: chronic Satisfied with current treatment?:  when it's working she's doing great CPAP use:  when she has the supplies Sleep quality with CPAP use: excellent Treament compliance:fair compliance Last sleep study: 2020 Treatments attempted: CPAP Wakes feeling refreshed:  no Daytime hypersomnolence:  yes Fatigue:  yes Insomnia:  no Good sleep hygiene:  no Difficulty falling asleep:  no Difficulty staying asleep:  yes Snoring bothers bed partner:  yes Observed apnea by bed partner: yes Obesity:  yes Hypertension: yes  Pulmonary hypertension:  no Coronary artery disease:  no   Relevant past medical, surgical, family and social history reviewed and updated as indicated. Interim medical history since our last visit reviewed. Allergies and medications reviewed and updated.  Review of Systems  Constitutional: Negative.   Respiratory: Negative.    Cardiovascular: Negative.   Gastrointestinal: Negative.   Musculoskeletal: Negative.   Neurological:  Positive for headaches. Negative for dizziness, tremors, seizures, syncope, facial asymmetry, speech difficulty, weakness, light-headedness and numbness.  Psychiatric/Behavioral: Negative.      Per HPI unless specifically indicated above     Objective:    BP  132/77 (BP Location: Right Arm, Patient Position: Sitting, Cuff Size: Normal)   Pulse 86   Temp 98.4 F (36.9 C) (Oral)   Ht 4\' 11"  (1.499 m)   Wt 217 lb 12.8 oz (98.8 kg)   LMP  (LMP Unknown)   SpO2 98%   BMI 43.99 kg/m   Wt Readings from Last 3 Encounters:  08/02/23 217 lb 12.8 oz (98.8 kg)  07/11/23 214 lb 3.2 oz (97.2 kg)  04/10/23 211 lb 9.6 oz (96 kg)    Physical Exam Vitals and nursing note reviewed.  Constitutional:      General: She is not in acute distress.    Appearance: Normal appearance. She is not ill-appearing, toxic-appearing or diaphoretic.  HENT:     Head: Normocephalic and atraumatic.     Right Ear: External ear normal.     Left Ear: External ear normal.     Nose: Nose normal.     Mouth/Throat:     Mouth: Mucous membranes are moist.     Pharynx: Oropharynx is clear.  Eyes:     General: No scleral icterus.       Right eye: No discharge.        Left eye: No discharge.     Extraocular Movements: Extraocular movements intact.     Conjunctiva/sclera: Conjunctivae normal.     Pupils: Pupils are equal, round, and reactive to light.  Cardiovascular:     Rate and Rhythm: Normal rate and regular rhythm.     Pulses: Normal pulses.  Heart sounds: Normal heart sounds. No murmur heard.    No friction rub. No gallop.  Pulmonary:     Effort: Pulmonary effort is normal. No respiratory distress.     Breath sounds: Normal breath sounds. No stridor. No wheezing, rhonchi or rales.  Chest:     Chest wall: No tenderness.  Musculoskeletal:        General: Normal range of motion.     Cervical back: Normal range of motion and neck supple.  Skin:    General: Skin is warm and dry.     Capillary Refill: Capillary refill takes less than 2 seconds.     Coloration: Skin is not jaundiced or pale.     Findings: No bruising, erythema, lesion or rash.  Neurological:     General: No focal deficit present.     Mental Status: She is alert and oriented to person, place, and time.  Mental status is at baseline.  Psychiatric:        Mood and Affect: Mood normal.        Behavior: Behavior normal.        Thought Content: Thought content normal.        Judgment: Judgment normal.     Results for orders placed or performed in visit on 07/11/23  Bayer DCA Hb A1c Waived   Collection Time: 07/11/23 10:07 AM  Result Value Ref Range   HB A1C (BAYER DCA - WAIVED) 5.9 (H) 4.8 - 5.6 %  CBC with Differential/Platelet   Collection Time: 07/11/23 10:08 AM  Result Value Ref Range   WBC 9.0 3.4 - 10.8 x10E3/uL   RBC 4.54 3.77 - 5.28 x10E6/uL   Hemoglobin 13.0 11.1 - 15.9 g/dL   Hematocrit 65.7 84.6 - 46.6 %   MCV 88 79 - 97 fL   MCH 28.6 26.6 - 33.0 pg   MCHC 32.5 31.5 - 35.7 g/dL   RDW 96.2 95.2 - 84.1 %   Platelets 325 150 - 450 x10E3/uL   Neutrophils 70 Not Estab. %   Lymphs 22 Not Estab. %   Monocytes 5 Not Estab. %   Eos 2 Not Estab. %   Basos 1 Not Estab. %   Neutrophils Absolute 6.2 1.4 - 7.0 x10E3/uL   Lymphocytes Absolute 2.0 0.7 - 3.1 x10E3/uL   Monocytes Absolute 0.5 0.1 - 0.9 x10E3/uL   EOS (ABSOLUTE) 0.2 0.0 - 0.4 x10E3/uL   Basophils Absolute 0.1 0.0 - 0.2 x10E3/uL   Immature Granulocytes 0 Not Estab. %   Immature Grans (Abs) 0.0 0.0 - 0.1 x10E3/uL  Comprehensive metabolic panel   Collection Time: 07/11/23 10:08 AM  Result Value Ref Range   Glucose 130 (H) 70 - 99 mg/dL   BUN 16 6 - 24 mg/dL   Creatinine, Ser 3.24 0.57 - 1.00 mg/dL   eGFR 72 >40 NU/UVO/5.36   BUN/Creatinine Ratio 17 9 - 23   Sodium 142 134 - 144 mmol/L   Potassium 3.6 3.5 - 5.2 mmol/L   Chloride 100 96 - 106 mmol/L   CO2 25 20 - 29 mmol/L   Calcium 9.7 8.7 - 10.2 mg/dL   Total Protein 7.0 6.0 - 8.5 g/dL   Albumin 4.3 3.8 - 4.9 g/dL   Globulin, Total 2.7 1.5 - 4.5 g/dL   Bilirubin Total 0.3 0.0 - 1.2 mg/dL   Alkaline Phosphatase 120 44 - 121 IU/L   AST 16 0 - 40 IU/L   ALT 18 0 - 32 IU/L  Lipid Panel w/o  Chol/HDL Ratio   Collection Time: 07/11/23 10:08 AM  Result Value Ref  Range   Cholesterol, Total 237 (H) 100 - 199 mg/dL   Triglycerides 478 (H) 0 - 149 mg/dL   HDL 52 >29 mg/dL   VLDL Cholesterol Cal 28 5 - 40 mg/dL   LDL Chol Calc (NIH) 562 (H) 0 - 99 mg/dL      Assessment & Plan:   Problem List Items Addressed This Visit       Respiratory   OSA (obstructive sleep apnea) - Primary   Has had OSA since 2009, well controlled on CPAP. Needs new supplies, but has been having difficulty getting them from her supply company. We will check in and get her new supplies. Will order new home sleep study if needed.         Follow up plan: No follow-ups on file.

## 2023-08-08 DIAGNOSIS — H3022 Posterior cyclitis, left eye: Secondary | ICD-10-CM | POA: Diagnosis not present

## 2023-08-08 DIAGNOSIS — H35352 Cystoid macular degeneration, left eye: Secondary | ICD-10-CM | POA: Diagnosis not present

## 2023-08-08 DIAGNOSIS — H2012 Chronic iridocyclitis, left eye: Secondary | ICD-10-CM | POA: Diagnosis not present

## 2023-08-15 ENCOUNTER — Other Ambulatory Visit: Payer: Self-pay

## 2023-08-15 DIAGNOSIS — M5416 Radiculopathy, lumbar region: Secondary | ICD-10-CM | POA: Diagnosis not present

## 2023-08-15 DIAGNOSIS — Z6841 Body Mass Index (BMI) 40.0 and over, adult: Secondary | ICD-10-CM | POA: Diagnosis not present

## 2023-08-15 DIAGNOSIS — G5611 Other lesions of median nerve, right upper limb: Secondary | ICD-10-CM | POA: Diagnosis not present

## 2023-08-15 MED ORDER — PREGABALIN 75 MG PO CAPS
75.0000 mg | ORAL_CAPSULE | Freq: Two times a day (BID) | ORAL | 1 refills | Status: DC
  Filled 2023-08-15: qty 60, 30d supply, fill #0
  Filled 2023-09-10 – 2023-10-02 (×2): qty 60, 30d supply, fill #1

## 2023-08-21 ENCOUNTER — Emergency Department
Admission: EM | Admit: 2023-08-21 | Discharge: 2023-08-21 | Disposition: A | Discharge status: Home / Self Care | Attending: Emergency Medicine | Admitting: Emergency Medicine

## 2023-08-21 ENCOUNTER — Other Ambulatory Visit: Payer: Self-pay

## 2023-08-21 ENCOUNTER — Other Ambulatory Visit: Payer: Self-pay | Admitting: Family Medicine

## 2023-08-21 ENCOUNTER — Encounter: Payer: Self-pay | Admitting: Emergency Medicine

## 2023-08-21 ENCOUNTER — Emergency Department

## 2023-08-21 DIAGNOSIS — M5459 Other low back pain: Secondary | ICD-10-CM | POA: Diagnosis not present

## 2023-08-21 DIAGNOSIS — J45909 Unspecified asthma, uncomplicated: Secondary | ICD-10-CM | POA: Insufficient documentation

## 2023-08-21 DIAGNOSIS — M545 Low back pain, unspecified: Secondary | ICD-10-CM | POA: Diagnosis not present

## 2023-08-21 DIAGNOSIS — M48061 Spinal stenosis, lumbar region without neurogenic claudication: Secondary | ICD-10-CM | POA: Diagnosis not present

## 2023-08-21 DIAGNOSIS — I10 Essential (primary) hypertension: Secondary | ICD-10-CM | POA: Insufficient documentation

## 2023-08-21 DIAGNOSIS — M5116 Intervertebral disc disorders with radiculopathy, lumbar region: Secondary | ICD-10-CM | POA: Diagnosis not present

## 2023-08-21 DIAGNOSIS — E119 Type 2 diabetes mellitus without complications: Secondary | ICD-10-CM | POA: Insufficient documentation

## 2023-08-21 DIAGNOSIS — M5117 Intervertebral disc disorders with radiculopathy, lumbosacral region: Secondary | ICD-10-CM | POA: Diagnosis not present

## 2023-08-21 MED ORDER — PREDNISONE 10 MG PO TABS
ORAL_TABLET | ORAL | 0 refills | Status: AC
  Filled 2023-08-21: qty 21, 6d supply, fill #0

## 2023-08-21 MED ORDER — OXYCODONE HCL 5 MG PO TABS
5.0000 mg | ORAL_TABLET | Freq: Once | ORAL | Status: AC
  Administered 2023-08-21: 5 mg via ORAL
  Filled 2023-08-21: qty 1

## 2023-08-21 MED ORDER — FREESTYLE LANCETS MISC
1.0000 | 0 refills | Status: DC
  Filled 2023-08-21 – 2023-12-16 (×2): qty 100, 90d supply, fill #0

## 2023-08-21 MED ORDER — NAPROXEN 500 MG PO TABS
500.0000 mg | ORAL_TABLET | Freq: Two times a day (BID) | ORAL | 0 refills | Status: DC
  Filled 2023-08-21: qty 30, 15d supply, fill #0

## 2023-08-21 MED ORDER — FREESTYLE LITE TEST VI STRP
1.0000 | ORAL_STRIP | 0 refills | Status: DC
  Filled 2023-08-21 – 2023-12-16 (×2): qty 100, 90d supply, fill #0

## 2023-08-21 MED ORDER — KETOROLAC TROMETHAMINE 60 MG/2ML IM SOLN
30.0000 mg | Freq: Once | INTRAMUSCULAR | Status: AC
Start: 2023-08-21 — End: 2023-08-21
  Administered 2023-08-21: 30 mg via INTRAMUSCULAR
  Filled 2023-08-21: qty 2

## 2023-08-21 MED ORDER — OXYCODONE-ACETAMINOPHEN 5-325 MG PO TABS
1.0000 | ORAL_TABLET | Freq: Four times a day (QID) | ORAL | 0 refills | Status: DC | PRN
  Filled 2023-08-21: qty 12, 3d supply, fill #0

## 2023-08-21 NOTE — ED Notes (Signed)
 See triage note:  Pt denies any loss of bowel or bladder control. Pt states HX of neck fusions, latest one in 2023. NAD noted. Pt tearful in room from pain.

## 2023-08-21 NOTE — ED Triage Notes (Signed)
 Patient to ED via POV for left lower back pain. Worse since Sunday and having difficulty walking due to same. Suppose to have lumbar MRI per neurologist but has not been scheduled yet.

## 2023-08-21 NOTE — Discharge Instructions (Signed)
 Please follow up with your neurosurgeon when possible.  Return to the ER for symptoms that change or worsen if unable to see PCP or the specialist.

## 2023-08-21 NOTE — ED Provider Notes (Signed)
 [facility] Provider Note    Event Date/Time   First MD Initiated Contact with Patient 08/21/23 0813     (approximate)   History   Back Pain   HPI  Tricia Hill is a 53 y.o. female  with history of HTN, DM, asthma, GERD and as listed in EMR presents to the emergency department for treatment and evaluation of non-traumatic left side back pain that started Sunday. She is under the care of neurosurgery who had planned to schedule lumbar MRI after EMG showed some concern for nerve compression in the L4 area. Pain increases with position change and ambulation. She denies bowel or bladder changes, saddle anesthesia, long term steroid use and cancer history.    Physical Exam   Triage Vital Signs: ED Triage Vitals  Encounter Vitals Group     BP 08/21/23 0732 132/86     Systolic BP Percentile --      Diastolic BP Percentile --      Pulse Rate 08/21/23 0732 83     Resp 08/21/23 0732 18     Temp 08/21/23 0732 97.9 F (36.6 C)     Temp Source 08/21/23 0732 Oral     SpO2 08/21/23 0732 99 %     Weight 08/21/23 0731 216 lb 0.8 oz (98 kg)     Height 08/21/23 0731 4\' 11"  (1.499 m)     Head Circumference --      Peak Flow --      Pain Score 08/21/23 0732 10     Pain Loc --      Pain Education --      Exclude from Growth Chart --     Most recent vital signs: Vitals:   08/21/23 0732  BP: 132/86  Pulse: 83  Resp: 18  Temp: 97.9 F (36.6 C)  SpO2: 99%    General: Awake, no distress.  CV:  Good peripheral perfusion.  Resp:  Normal effort.  Abd:  No distention.  Other:  Left lower back pain reproducible with attempt to perform straight leg raise. Strength is 5/5 in the left lower extremity.    ED Results / Procedures / Treatments   Labs (all labs ordered are listed, but only abnormal results are displayed) Labs Reviewed - No data to display   EKG  Not indicated.   RADIOLOGY  Image and radiology report reviewed and interpreted by me.  Radiology report consistent with the same.  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 Possible anterior synovial cyst at the degenerated L5-S1  PROCEDURES:  Critical Care performed: No  Procedures   MEDICATIONS ORDERED IN ED:  Medications  ketorolac (TORADOL) injection 30 mg (30 mg Intramuscular Given 08/21/23 0948)  oxyCODONE (Oxy IR/ROXICODONE) immediate release tablet 5 mg (5 mg Oral Given 08/21/23 0947)     IMPRESSION / MDM / ASSESSMENT AND PLAN / ED COURSE   I have reviewed the triage note.  Differential diagnosis includes, but is not limited to, sciatica, DDD, disc bulge/herniation  Patient's presentation is most consistent with acute illness / injury with system symptoms.  53 year old female presents to the ER for acute on chronic low back pain that is typically on the right. Pain started 3 days ago for unknown reason. No relief with rest and home medications including percocet.  She appears quite uncomfortable. Medication ordered in hope of pain control. Plan will be to get a CT of the lumbar spine. She has no red flags.  CT lumbar  spine shows multiple areas of disc abnormality. Results discussed with the patient. Plan will be to treat with prednisone and pain medication. She plans to call her neurosurgeon's office today to schedule an appointment. ER return precautions discussed.      FINAL CLINICAL IMPRESSION(S) / ED DIAGNOSES   Final diagnoses:  Acute left-sided low back pain without sciatica     Rx / DC Orders   ED Discharge Orders          Ordered    predniSONE (DELTASONE) 10 MG tablet  Daily        08/21/23 1013    oxyCODONE-acetaminophen (PERCOCET) 5-325 MG tablet  Every 6 hours PRN        08/21/23 1013    naproxen (NAPROSYN) 500 MG tablet  2 times daily with meals        08/21/23 1013             Note:  This document was prepared using Dragon voice recognition software and may include unintentional dictation errors.    [name], FNP 08/21/23 1302    Merwyn Katos, MD 08/22/23 [phone]

## 2023-08-29 DIAGNOSIS — M48061 Spinal stenosis, lumbar region without neurogenic claudication: Secondary | ICD-10-CM | POA: Diagnosis not present

## 2023-08-29 DIAGNOSIS — M2548 Effusion, other site: Secondary | ICD-10-CM | POA: Diagnosis not present

## 2023-08-29 DIAGNOSIS — M5126 Other intervertebral disc displacement, lumbar region: Secondary | ICD-10-CM | POA: Diagnosis not present

## 2023-08-29 DIAGNOSIS — M5136 Other intervertebral disc degeneration, lumbar region with discogenic back pain only: Secondary | ICD-10-CM | POA: Diagnosis not present

## 2023-08-29 DIAGNOSIS — M5416 Radiculopathy, lumbar region: Secondary | ICD-10-CM | POA: Diagnosis not present

## 2023-09-05 DIAGNOSIS — Z6841 Body Mass Index (BMI) 40.0 and over, adult: Secondary | ICD-10-CM | POA: Diagnosis not present

## 2023-09-05 DIAGNOSIS — H3022 Posterior cyclitis, left eye: Secondary | ICD-10-CM | POA: Diagnosis not present

## 2023-09-05 DIAGNOSIS — G5611 Other lesions of median nerve, right upper limb: Secondary | ICD-10-CM | POA: Diagnosis not present

## 2023-09-05 DIAGNOSIS — M47816 Spondylosis without myelopathy or radiculopathy, lumbar region: Secondary | ICD-10-CM | POA: Diagnosis not present

## 2023-09-05 DIAGNOSIS — M5416 Radiculopathy, lumbar region: Secondary | ICD-10-CM | POA: Diagnosis not present

## 2023-09-05 DIAGNOSIS — H35352 Cystoid macular degeneration, left eye: Secondary | ICD-10-CM | POA: Diagnosis not present

## 2023-09-05 DIAGNOSIS — H2012 Chronic iridocyclitis, left eye: Secondary | ICD-10-CM | POA: Diagnosis not present

## 2023-09-09 ENCOUNTER — Other Ambulatory Visit: Payer: Self-pay

## 2023-09-10 ENCOUNTER — Other Ambulatory Visit: Payer: Self-pay

## 2023-09-20 ENCOUNTER — Other Ambulatory Visit: Payer: Self-pay

## 2023-09-26 ENCOUNTER — Ambulatory Visit: Payer: Self-pay | Admitting: Urology

## 2023-10-01 ENCOUNTER — Other Ambulatory Visit: Payer: Self-pay

## 2023-10-01 DIAGNOSIS — G5601 Carpal tunnel syndrome, right upper limb: Secondary | ICD-10-CM | POA: Diagnosis not present

## 2023-10-01 DIAGNOSIS — Z9889 Other specified postprocedural states: Secondary | ICD-10-CM | POA: Insufficient documentation

## 2023-10-01 HISTORY — PX: OTHER SURGICAL HISTORY: SHX169

## 2023-10-01 MED ORDER — HYDROCODONE-ACETAMINOPHEN 5-325 MG PO TABS
1.0000 | ORAL_TABLET | Freq: Four times a day (QID) | ORAL | 0 refills | Status: DC | PRN
  Filled 2023-10-01: qty 20, 5d supply, fill #0

## 2023-10-02 ENCOUNTER — Other Ambulatory Visit: Payer: Self-pay

## 2023-10-10 ENCOUNTER — Ambulatory Visit
Admission: RE | Admit: 2023-10-10 | Discharge: 2023-10-10 | Disposition: A | Discharge status: Home / Self Care | Source: Ambulatory Visit | Attending: Urology | Admitting: Urology

## 2023-10-10 ENCOUNTER — Other Ambulatory Visit: Payer: Self-pay | Admitting: *Deleted

## 2023-10-10 ENCOUNTER — Ambulatory Visit
Admission: RE | Admit: 2023-10-10 | Discharge: 2023-10-10 | Disposition: A | Discharge status: Home / Self Care | Attending: Urology | Admitting: Urology

## 2023-10-10 ENCOUNTER — Ambulatory Visit: Admitting: Urology

## 2023-10-10 VITALS — BP 134/82 | HR 81 | Ht 59.0 in | Wt 211.0 lb

## 2023-10-10 DIAGNOSIS — N200 Calculus of kidney: Secondary | ICD-10-CM | POA: Diagnosis not present

## 2023-10-10 DIAGNOSIS — Z87442 Personal history of urinary calculi: Secondary | ICD-10-CM | POA: Diagnosis not present

## 2023-10-10 DIAGNOSIS — N201 Calculus of ureter: Secondary | ICD-10-CM

## 2023-10-10 NOTE — Progress Notes (Signed)
 I, Tricia Hill, acting as a scribe for Geraline Knapp, MD., have documented all relevant documentation on the behalf of Geraline Knapp, MD, as directed by Geraline Knapp, MD while in the presence of Geraline Knapp, MD.  10/10/2023 3:37 PM   Tricia Hill Oct 30, 1970 [id number]  Referring provider: Solomon Dupre, DO [address] [place],  [place] [address]  Chief Complaint  Patient presents with   Nephrolithiasis   Urologic history  1. Personal history of urinary calculi Status post-uretoscopic removal 5 mm left distal ureter calculus 08/2022.  Stone analysis 80/20 CaOx mono/CaOx di  No renal calculi on CT 06/2022  HPI: Tricia Hill is a 53 y.o. female presents for annual visit.  No complaints since last year's visit. Denies flank, abdominal, pelvic pain or recurrent stone symptoms.    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   Asthma    Diabetes mellitus without complication (HCC) 2016   GERD (gastroesophageal reflux disease)    Heart murmur    child   History of kidney stones    Hypertension    Neuromuscular disorder (HCC)    right hands tingling and numbness   PONV (postoperative nausea and vomiting)    Shingles    Sleep apnea    USE CPAP    Surgical History: Past Surgical History:  Procedure Laterality Date   ANTERIOR CERVICAL DECOMP/DISCECTOMY FUSION N/A 04/13/2022   Procedure: Anterior Cervical Discectomy Fusion - Cervical seven-Thoracic one removal of plate;  Surgeon: Agustina Aldrich, MD;  [facility];  Service: Neurosurgery;  Laterality: N/A;   CESAREAN SECTION     X 2   CHOLECYSTECTOMY N/A 01/18/2017   Procedure: LAPAROSCOPIC CHOLECYSTECTOMY WITH INTRAOPERATIVE CHOLANGIOGRAM;  Surgeon: Marshall Skeeter, MD;  Location: [facility] ORS;  Service: General;  Laterality: N/A;   COLONOSCOPY WITH PROPOFOL  N/A 10/13/2020   Procedure: COLONOSCOPY WITH PROPOFOL ;  Surgeon: Selena Daily, MD;   Location: [facility] ENDOSCOPY;  Service: Gastroenterology;  Laterality: N/A;  COVID POSITIVE 09/30/2020   CYSTOSCOPY/URETEROSCOPY/HOLMIUM LASER/STENT PLACEMENT Left 08/21/2022   Procedure: CYSTOSCOPY/URETEROSCOPY/HOLMIUM LASER/STENT PLACEMENT;  Surgeon: Geraline Knapp, MD;  Location: [facility] ORS;  Service: Urology;  Laterality: Left;   DIAGNOSTIC LAPAROSCOPY  2006   EXCISION OF ABDOMINAL WALL ENDOMETRIOMA   EXTRACORPOREAL SHOCK WAVE LITHOTRIPSY Left 07/12/2022   Procedure: EXTRACORPOREAL SHOCK WAVE LITHOTRIPSY (ESWL);  Surgeon: Dustin Gimenez, MD;  Location: [facility] ORS;  Service: Urology;  Laterality: Left;   SPINAL FUSION  2012   C3 and C4   TOTAL ABDOMINAL HYSTERECTOMY     Partial    Home Medications:  Allergies as of 10/10/2023       Reactions   Lisinopril  Cough   Semaglutide  Nausea And Vomiting   Ultram [tramadol Hcl] Nausea And Vomiting   Severe vomiting    Victoza  [liraglutide ] Nausea And Vomiting        Medication List        Accurate as of Oct 10, 2023  3:37 PM. If you have any questions, ask your nurse or doctor.          STOP taking these medications    cyclopentolate  1 % ophthalmic solution Commonly known as: CYCLODRYL,CYCLOGYL  Stopped by: Geraline Knapp   Difluprednate  0.05 % Emul Stopped by: Geraline Knapp   dorzolamide -timolol  2-0.5 % ophthalmic solution Commonly known as: COSOPT  Stopped by: Geraline Knapp   HYDROcodone -acetaminophen   5-325 MG tablet Commonly known as: NORCO/VICODIN Stopped by: Geraline Knapp   ketorolac  0.5 % ophthalmic solution Commonly known as: ACULAR  Stopped by: Geraline Knapp   naproxen  500 MG tablet Commonly known as: Naprosyn  Stopped by: Geraline Knapp   oxyCODONE -acetaminophen  5-325 MG tablet Commonly known as: Percocet Stopped by: Geraline Knapp       TAKE these medications    atorvastatin  40 MG tablet Commonly known as: LIPITOR Take 1 tablet (40 mg total) by mouth once a week.   escitalopram  5 MG  tablet Commonly known as: LEXAPRO  Take 0.5 tablets (2.5 mg total) by mouth daily for 7 days. Then stop   freestyle lancets Use as directed.   FREESTYLE LITE test strip Generic drug: glucose blood Use as directed   gabapentin  100 MG capsule Commonly known as: NEURONTIN  Take 1 capsule (100 mg total) by mouth 3 (three) times daily.   glucose monitoring kit monitoring kit 1 each by Does not apply route as needed for other.   ibuprofen  600 MG tablet Commonly known as: ADVIL  Take 1 tablet (600 mg total) by mouth every 8 (eight) hours as needed for moderate pain.   losartan  25 MG tablet Commonly known as: Cozaar  Take 1/2 tablet (12.5 mg total) by mouth daily. (Take 0.5 tablets (12.5 mg total) by mouth daily.)   metFORMIN  1000 MG tablet Commonly known as: GLUCOPHAGE  Take 1 tablet (1,000 mg total) by mouth 2 (two) times daily.   Mounjaro  10 MG/0.5ML Pen Generic drug: tirzepatide  Inject 10 mg into the skin once a week.   ondansetron  4 MG tablet Commonly known as: Zofran  Take 1 tablet (4 mg total) by mouth every 8 (eight) hours as needed for nausea or vomiting.   pregabalin  75 MG capsule Commonly known as: LYRICA  Take 1 capsule (75 mg total) by mouth 2 (two) times daily.   traZODone  50 MG tablet Commonly known as: DESYREL  Take 0.5-1 tablets (25-50 mg total) by mouth at bedtime as needed for sleep.        Allergies:  Allergies  Allergen Reactions   Lisinopril  Cough   Semaglutide  Nausea And Vomiting   Ultram [Tramadol Hcl] Nausea And Vomiting    Severe vomiting    Victoza  [Liraglutide ] Nausea And Vomiting    Family History: Family History  Problem Relation Age of Onset   Hypertension Mother    Diabetes Mother    Hypertension Father    Murrell Arrant' disease Sister    Clotting disorder Brother    Heart disease Maternal Grandmother    Heart disease Maternal Grandfather    Kidney disease Neg Hx    Breast cancer Neg Hx     Social History:  reports that she has never  smoked. She has never used smokeless tobacco. She reports that she does not currently use alcohol. She reports that she does not use drugs.   Physical Exam: BP 134/82   Pulse 81   Ht 4\' 11"  (1.499 m)   Wt 211 lb (95.7 kg)   LMP  (LMP Unknown)   BMI 42.62 kg/m   Constitutional:  Alert and oriented, No acute distress. HEENT: [place] AT Respiratory: Normal respiratory effort, no increased work of breathing. Psychiatric: Normal mood and affect.   Pertinent Imaging: KUB performed earlier today was personally reviewed and interpreted. No calcifications are identified, suspicious for recurrent urinary tract calculi. The X-ray has not been interpreted by radiology.    Assessment & Plan:    1. History of urinary calculi No evidence  of recurrent stone disease.  She will ask her PCP for a follow-up KUB in approximately 2 years Follow-up as needed for recurrent stone symptoms.  I have reviewed the above documentation for accuracy and completeness, and I agree with the above.   Geraline Knapp, MD  [facility] [address], [address] [place], [place] [address] [phone]

## 2023-10-13 ENCOUNTER — Encounter: Payer: Self-pay | Admitting: Urology

## 2023-10-21 ENCOUNTER — Ambulatory Visit: Payer: Self-pay | Admitting: Family Medicine

## 2023-10-21 ENCOUNTER — Encounter: Payer: Self-pay | Admitting: Family Medicine

## 2023-10-21 ENCOUNTER — Other Ambulatory Visit: Payer: Self-pay

## 2023-10-21 VITALS — BP 101/68 | HR 85 | Temp 97.9°F | Ht 59.0 in | Wt 220.8 lb

## 2023-10-21 DIAGNOSIS — F419 Anxiety disorder, unspecified: Secondary | ICD-10-CM

## 2023-10-21 DIAGNOSIS — E118 Type 2 diabetes mellitus with unspecified complications: Secondary | ICD-10-CM

## 2023-10-21 DIAGNOSIS — Z7985 Long-term (current) use of injectable non-insulin antidiabetic drugs: Secondary | ICD-10-CM | POA: Diagnosis not present

## 2023-10-21 LAB — BAYER DCA HB A1C WAIVED: HB A1C (BAYER DCA - WAIVED): 6.7 % — ABNORMAL HIGH (ref 4.8–5.6)

## 2023-10-21 MED ORDER — ESCITALOPRAM OXALATE 5 MG PO TABS: 5.0000 mg | ORAL_TABLET | Freq: Every day | ORAL | 1 refills | Status: DC

## 2023-10-21 MED ORDER — TIRZEPATIDE 12.5 MG/0.5ML ~~LOC~~ SOAJ
12.5000 mg | SUBCUTANEOUS | 1 refills | Status: DC
  Filled 2023-10-21: qty 6, 84d supply, fill #0
  Filled 2023-12-16: qty 2, 28d supply, fill #0
  Filled 2024-01-17 – 2024-01-31 (×2): qty 2, 28d supply, fill #1

## 2023-10-21 NOTE — Assessment & Plan Note (Signed)
 Doing great with A1c of 6.7. Will increase her mounjaro  to 12.5 to help with weight loss. Call with any concerns.

## 2023-10-21 NOTE — Progress Notes (Signed)
 BP 101/68 (BP Location: Left Arm, Patient Position: Sitting, Cuff Size: Large)   Pulse 85   Temp 97.9 F (36.6 C) (Oral)   Ht 4\' 11"  (1.499 m)   Wt 220 lb 12.8 oz (100.2 kg)   LMP  (LMP Unknown)   SpO2 97%   BMI 44.60 kg/m    Subjective:    Patient ID: Tricia Hill, female    DOB: [birth year], 53 y.o.   MRN: [id number]  HPI: Tricia Hill is a 53 y.o. female  Chief Complaint  Patient presents with   Diabetes   DIABETES Hypoglycemic episodes:no Polydipsia/polyuria: yes Visual disturbance: yes Chest pain: no Paresthesias: no Glucose Monitoring: yes Taking Insulin ?: no Blood Pressure Monitoring: not checking Retinal Examination: Up to Date Foot Exam: Up to Date Diabetic Education: Completed Pneumovax: Up to Date Influenza: Up to Date Aspirin: no  DEPRESSION Mood status: exacerbated Satisfied with current treatment?: no Symptom severity: moderate  Duration of current treatment : chronic Side effects: no Medication compliance: fair compliance Psychotherapy/counseling: no  Previous psychiatric medications: lexapro  Depressed mood: yes Anxious mood: yes Anhedonia: no Significant weight loss or gain: no Insomnia: no  Fatigue: yes Feelings of worthlessness or guilt: no Impaired concentration/indecisiveness: no Suicidal ideations: no Hopelessness: no Crying spells: no    10/21/2023   10:09 AM 08/02/2023   10:48 AM 07/11/2023   10:07 AM 04/10/2023    8:57 AM 10/01/2022    2:05 PM  Depression screen PHQ 2/9  Decreased Interest 1 0 0 0 0  Down, Depressed, Hopeless 1 0 0 0 0  PHQ - 2 Score 2 0 0 0 0  Altered sleeping 0 0 1  2  Tired, decreased energy 1 1 1  2   Change in appetite 0 0 0  0  Feeling bad or failure about yourself  1 0 0  0  Trouble concentrating 0 0 0  0  Moving slowly or fidgety/restless 0 0 0  0  Suicidal thoughts 0 0 0  0  PHQ-9 Score 4 1 2  4   Difficult doing work/chores Somewhat difficult  Not difficult at all       Relevant  past medical, surgical, family and social history reviewed and updated as indicated. Interim medical history since our last visit reviewed. Allergies and medications reviewed and updated.  Review of Systems  Constitutional: Negative.        + flushing  Respiratory: Negative.    Cardiovascular: Negative.   Musculoskeletal: Negative.   Neurological: Negative.   Psychiatric/Behavioral:  Positive for dysphoric mood and suicidal ideas (passive). Negative for agitation, behavioral problems, confusion, decreased concentration, hallucinations, self-injury and sleep disturbance. The patient is nervous/anxious. The patient is not hyperactive.     Per HPI unless specifically indicated above     Objective:     BP 101/68 (BP Location: Left Arm, Patient Position: Sitting, Cuff Size: Large)   Pulse 85   Temp 97.9 F (36.6 C) (Oral)   Ht 4\' 11"  (1.499 m)   Wt 220 lb 12.8 oz (100.2 kg)   LMP  (LMP Unknown)   SpO2 97%   BMI 44.60 kg/m   Wt Readings from Last 3 Encounters:  10/21/23 220 lb 12.8 oz (100.2 kg)  10/10/23 211 lb (95.7 kg)  08/21/23 216 lb 0.8 oz (98 kg)    Physical Exam Vitals and nursing note reviewed.  Constitutional:      General: She is not in acute distress.    Appearance: Normal appearance. She  is not ill-appearing, toxic-appearing or diaphoretic.  HENT:     Head: Normocephalic and atraumatic.     Right Ear: External ear normal.     Left Ear: External ear normal.     Nose: Nose normal.     Mouth/Throat:     Mouth: Mucous membranes are moist.     Pharynx: Oropharynx is clear.  Eyes:     General: No scleral icterus.       Right eye: No discharge.        Left eye: No discharge.     Extraocular Movements: Extraocular movements intact.     Conjunctiva/sclera: Conjunctivae normal.     Pupils: Pupils are equal, round, and reactive to light.  Cardiovascular:     Rate and Rhythm: Normal rate and regular rhythm.     Pulses: Normal pulses.     Heart sounds: Normal heart  sounds. No murmur heard.    No friction rub. No gallop.  Pulmonary:     Effort: Pulmonary effort is normal. No respiratory distress.     Breath sounds: Normal breath sounds. No stridor. No wheezing, rhonchi or rales.  Chest:     Chest wall: No tenderness.  Musculoskeletal:        General: Normal range of motion.     Cervical back: Normal range of motion and neck supple.  Skin:    General: Skin is warm and dry.     Capillary Refill: Capillary refill takes less than 2 seconds.     Coloration: Skin is not jaundiced or pale.     Findings: No bruising, erythema, lesion or rash.  Neurological:     General: No focal deficit present.     Mental Status: She is alert and oriented to person, place, and time. Mental status is at baseline.  Psychiatric:        Mood and Affect: Mood normal.        Behavior: Behavior normal.        Thought Content: Thought content normal.        Judgment: Judgment normal.     Results for orders placed or performed in visit on 07/11/23  Bayer DCA Hb A1c Waived   Collection Time: 07/11/23 10:07 AM  Result Value Ref Range   HB A1C (BAYER DCA - WAIVED) 5.9 (H) 4.8 - 5.6 %  CBC with Differential/Platelet   Collection Time: 07/11/23 10:08 AM  Result Value Ref Range   WBC 9.0 3.4 - 10.8 x10E3/uL   RBC 4.54 3.77 - 5.28 x10E6/uL   Hemoglobin 13.0 11.1 - 15.9 g/dL   Hematocrit 41.3 24.4 - 46.6 %   MCV 88 79 - 97 fL   MCH 28.6 26.6 - 33.0 pg   MCHC 32.5 31.5 - 35.7 g/dL   RDW 01.0 27.2 - 53.6 %   Platelets 325 150 - 450 x10E3/uL   Neutrophils 70 Not Estab. %   Lymphs 22 Not Estab. %   Monocytes 5 Not Estab. %   Eos 2 Not Estab. %   Basos 1 Not Estab. %   Neutrophils Absolute 6.2 1.4 - 7.0 x10E3/uL   Lymphocytes Absolute 2.0 0.7 - 3.1 x10E3/uL   Monocytes Absolute 0.5 0.1 - 0.9 x10E3/uL   EOS (ABSOLUTE) 0.2 0.0 - 0.4 x10E3/uL   Basophils Absolute 0.1 0.0 - 0.2 x10E3/uL   Immature Granulocytes 0 Not Estab. %   Immature Grans (Abs) 0.0 0.0 - 0.1 x10E3/uL   Comprehensive metabolic panel   Collection Time: 07/11/23 10:08 AM  Result Value Ref Range   Glucose 130 (H) 70 - 99 mg/dL   BUN 16 6 - 24 mg/dL   Creatinine, Ser 1.61 0.57 - 1.00 mg/dL   eGFR 72 >09 UE/AVW/0.98   BUN/Creatinine Ratio 17 9 - 23   Sodium 142 134 - 144 mmol/L   Potassium 3.6 3.5 - 5.2 mmol/L   Chloride 100 96 - 106 mmol/L   CO2 25 20 - 29 mmol/L   Calcium  9.7 8.7 - 10.2 mg/dL   Total Protein 7.0 6.0 - 8.5 g/dL   Albumin 4.3 3.8 - 4.9 g/dL   Globulin, Total 2.7 1.5 - 4.5 g/dL   Bilirubin Total 0.3 0.0 - 1.2 mg/dL   Alkaline Phosphatase 120 44 - 121 IU/L   AST 16 0 - 40 IU/L   ALT 18 0 - 32 IU/L  Lipid Panel w/o Chol/HDL Ratio   Collection Time: 07/11/23 10:08 AM  Result Value Ref Range   Cholesterol, Total 237 (H) 100 - 199 mg/dL   Triglycerides 119 (H) 0 - 149 mg/dL   HDL 52 >14 mg/dL   VLDL Cholesterol Cal 28 5 - 40 mg/dL   LDL Chol Calc (NIH) 782 (H) 0 - 99 mg/dL      Assessment & Plan:   Problem List Items Addressed This Visit       Endocrine   Controlled diabetes mellitus type 2 with complications (HCC) - Primary   Doing great with A1c of 6.7. Will increase her mounjaro  to 12.5 to help with weight loss. Call with any concerns.       Relevant Medications   tirzepatide  (MOUNJARO ) 12.5 MG/0.5ML Pen   Other Relevant Orders   Bayer DCA Hb A1c Waived     Other   Anxiety   Was acting up when she stopped the lexapro  and was on the lyrica . Will restart her lexapro  and recheck in 6 weeks. Call with any concerns.       Relevant Medications   escitalopram  (LEXAPRO ) 5 MG tablet     Follow up plan: Return in about 6 weeks (around 12/02/2023) for virtual OK.

## 2023-10-21 NOTE — Assessment & Plan Note (Addendum)
 Was acting up when she stopped the lexapro  and was on the lyrica . Will restart her lexapro  and recheck in 6 weeks. Call with any concerns.

## 2023-10-29 DIAGNOSIS — R911 Solitary pulmonary nodule: Secondary | ICD-10-CM | POA: Diagnosis not present

## 2023-10-30 DIAGNOSIS — R911 Solitary pulmonary nodule: Secondary | ICD-10-CM | POA: Diagnosis not present

## 2023-11-06 ENCOUNTER — Other Ambulatory Visit: Payer: Self-pay

## 2023-11-06 MED ORDER — METHYLPREDNISOLONE 4 MG PO TBPK
ORAL_TABLET | ORAL | 0 refills | Status: DC
  Filled 2023-11-06: qty 21, 6d supply, fill #0

## 2023-11-06 MED ORDER — HYDROCODONE-ACETAMINOPHEN 5-325 MG PO TABS
1.0000 | ORAL_TABLET | Freq: Four times a day (QID) | ORAL | 0 refills | Status: DC | PRN
  Filled 2023-11-06: qty 20, 5d supply, fill #0

## 2023-11-07 ENCOUNTER — Other Ambulatory Visit: Payer: Self-pay

## 2023-11-26 DIAGNOSIS — H2012 Chronic iridocyclitis, left eye: Secondary | ICD-10-CM | POA: Diagnosis not present

## 2023-11-26 DIAGNOSIS — H3022 Posterior cyclitis, left eye: Secondary | ICD-10-CM | POA: Diagnosis not present

## 2023-11-26 DIAGNOSIS — H35352 Cystoid macular degeneration, left eye: Secondary | ICD-10-CM | POA: Diagnosis not present

## 2023-12-03 ENCOUNTER — Ambulatory Visit: Admitting: Family Medicine

## 2023-12-03 VITALS — BP 118/81 | HR 84 | Ht 59.0 in | Wt 224.0 lb

## 2023-12-03 DIAGNOSIS — M5416 Radiculopathy, lumbar region: Secondary | ICD-10-CM | POA: Insufficient documentation

## 2023-12-03 DIAGNOSIS — G5611 Other lesions of median nerve, right upper limb: Secondary | ICD-10-CM | POA: Insufficient documentation

## 2023-12-03 DIAGNOSIS — M4722 Other spondylosis with radiculopathy, cervical region: Secondary | ICD-10-CM | POA: Diagnosis not present

## 2023-12-03 DIAGNOSIS — F419 Anxiety disorder, unspecified: Secondary | ICD-10-CM | POA: Diagnosis not present

## 2023-12-03 DIAGNOSIS — M4712 Other spondylosis with myelopathy, cervical region: Secondary | ICD-10-CM | POA: Diagnosis not present

## 2023-12-03 DIAGNOSIS — M545 Low back pain, unspecified: Secondary | ICD-10-CM | POA: Insufficient documentation

## 2023-12-03 DIAGNOSIS — R200 Anesthesia of skin: Secondary | ICD-10-CM | POA: Insufficient documentation

## 2023-12-03 NOTE — Assessment & Plan Note (Signed)
Under good control on current regimen. Continue current regimen. Continue to monitor. Call with any concerns. Refills up to date.   

## 2023-12-03 NOTE — Progress Notes (Signed)
 BP 118/81   Pulse 84   Ht 4' 11 (1.499 m)   Wt 224 lb (101.6 kg)   LMP  (LMP Unknown)   SpO2 98%   BMI 45.24 kg/m    Subjective:    Patient ID: Tricia Hill, female    DOB: 24-Aug-1970, 53 y.o.   MRN: [id number]  HPI: Tricia Hill is a 53 y.o. female  Chief Complaint  Patient presents with   Anxiety   Back is acting up again. Has been having shooting pains and numbness and tingling going down her R arm and having cramping and pain in her R hand. Feels the same as it was before she had to have surgery before.   Having low back pain that makes it hard to lay down. Having trouble with moving in bed and shifting her weight on the couch. She is following with neurosurgery as well.   Did not increase her mounjaro - finishing her old Rx before moving up the dose.   ANXIETY Duration: chronic Status:better Anxious mood: yes  Excessive worrying: no Irritability: no  Sweating: no Nausea: no Palpitations:no Hyperventilation: no Panic attacks: no Agoraphobia: no  Obscessions/compulsions: no Depressed mood: no    12/03/2023   10:29 AM 10/21/2023   10:09 AM 08/02/2023   10:48 AM 07/11/2023   10:07 AM 04/10/2023    8:57 AM  Depression screen PHQ 2/9  Decreased Interest 0 1 0 0 0  Down, Depressed, Hopeless 0 1 0 0 0  PHQ - 2 Score 0 2 0 0 0  Altered sleeping 1 0 0 1   Tired, decreased energy 1 1 1 1    Change in appetite 0 0 0 0   Feeling bad or failure about yourself  0 1 0 0   Trouble concentrating 0 0 0 0   Moving slowly or fidgety/restless 0 0 0 0   Suicidal thoughts 0 0 0 0   PHQ-9 Score 2 4 1 2    Difficult doing work/chores Not difficult at all Somewhat difficult  Not difficult at all       12/03/2023   10:30 AM 10/21/2023   10:11 AM 08/02/2023   10:48 AM 07/11/2023   10:07 AM  GAD 7 : Generalized Anxiety Score  Nervous, Anxious, on Edge 0 0 0 0  Control/stop worrying 0 0 0 0  Worry too much - different things 1 1 0 0  Trouble relaxing 0 0 0 0  Restless 0  0 0 0  Easily annoyed or irritable 0 1 0 1  Afraid - awful might happen 0 0 0 0  Total GAD 7 Score 1 2 0 1  Anxiety Difficulty Not difficult at all Not difficult at all  Not difficult at all   Anhedonia: no Weight changes: no Insomnia: no   Hypersomnia: no Fatigue/loss of energy: no Feelings of worthlessness: no Feelings of guilt: no Impaired concentration/indecisiveness: no Suicidal ideations: no  Crying spells: no Recent Stressors/Life Changes: no   Relationship problems: no   Family stress: no     Financial stress: no    Job stress: no    Recent death/loss: no   Relevant past medical, surgical, family and social history reviewed and updated as indicated. Interim medical history since our last visit reviewed. Allergies and medications reviewed and updated.  Review of Systems  Constitutional: Negative.   Respiratory: Negative.    Cardiovascular: Negative.   Musculoskeletal:  Positive for back pain and myalgias. Negative for arthralgias, gait  problem, joint swelling, neck pain and neck stiffness.  Skin: Negative.   Neurological:  Positive for tremors and numbness. Negative for dizziness, seizures, syncope, facial asymmetry, speech difficulty, weakness, light-headedness and headaches.  Psychiatric/Behavioral: Negative.      Per HPI unless specifically indicated above     Objective:    BP 118/81   Pulse 84   Ht 4' 11 (1.499 m)   Wt 224 lb (101.6 kg)   LMP  (LMP Unknown)   SpO2 98%   BMI 45.24 kg/m   Wt Readings from Last 3 Encounters:  12/03/23 224 lb (101.6 kg)  10/21/23 220 lb 12.8 oz (100.2 kg)  10/10/23 211 lb (95.7 kg)    Physical Exam Vitals and nursing note reviewed.  Constitutional:      General: She is not in acute distress.    Appearance: Normal appearance. She is not ill-appearing, toxic-appearing or diaphoretic.  HENT:     Head: Normocephalic and atraumatic.     Right Ear: External ear normal.     Left Ear: External ear normal.     Nose: Nose  normal.     Mouth/Throat:     Mouth: Mucous membranes are moist.     Pharynx: Oropharynx is clear.   Eyes:     General: No scleral icterus.       Right eye: No discharge.        Left eye: No discharge.     Extraocular Movements: Extraocular movements intact.     Conjunctiva/sclera: Conjunctivae normal.     Pupils: Pupils are equal, round, and reactive to light.    Cardiovascular:     Rate and Rhythm: Normal rate and regular rhythm.     Pulses: Normal pulses.     Heart sounds: Normal heart sounds. No murmur heard.    No friction rub. No gallop.  Pulmonary:     Effort: Pulmonary effort is normal. No respiratory distress.     Breath sounds: Normal breath sounds. No stridor. No wheezing, rhonchi or rales.  Chest:     Chest wall: No tenderness.   Musculoskeletal:        General: Normal range of motion.     Cervical back: Normal range of motion and neck supple.   Skin:    General: Skin is warm and dry.     Capillary Refill: Capillary refill takes less than 2 seconds.     Coloration: Skin is not jaundiced or pale.     Findings: No bruising, erythema, lesion or rash.   Neurological:     General: No focal deficit present.     Mental Status: She is alert and oriented to person, place, and time. Mental status is at baseline.   Psychiatric:        Mood and Affect: Mood normal.        Behavior: Behavior normal.        Thought Content: Thought content normal.        Judgment: Judgment normal.     Results for orders placed or performed in visit on 10/21/23  Bayer DCA Hb A1c Waived   Collection Time: 10/21/23  9:55 AM  Result Value Ref Range   HB A1C (BAYER DCA - WAIVED) 6.7 (H) 4.8 - 5.6 %      Assessment & Plan:   Problem List Items Addressed This Visit       Nervous and Auditory   Cervical spondylosis with myelopathy and radiculopathy   Acting up. Working with neurosurgery. Unsure  if she'd like to get a second opinion. Due to see them again on Thursday. Will contact us   if she'd like a 2nd opinion and we'll refer to Dr. Lazaro        Other   Anxiety - Primary   Under good control on current regimen. Continue current regimen. Continue to monitor. Call with any concerns. Refills up to date.         Follow up plan: Return in about 6 weeks (around 01/14/2024) for physical.

## 2023-12-03 NOTE — Assessment & Plan Note (Signed)
 Acting up. Working with neurosurgery. Unsure if she'd like to get a second opinion. Due to see them again on Thursday. Will contact us  if she'd like a 2nd opinion and we'll refer to Dr. Lazaro

## 2023-12-05 DIAGNOSIS — M5416 Radiculopathy, lumbar region: Secondary | ICD-10-CM | POA: Diagnosis not present

## 2023-12-12 ENCOUNTER — Encounter: Payer: Self-pay | Admitting: Family Medicine

## 2023-12-16 ENCOUNTER — Other Ambulatory Visit: Payer: Self-pay

## 2024-01-01 DIAGNOSIS — S058X1A Other injuries of right eye and orbit, initial encounter: Secondary | ICD-10-CM | POA: Diagnosis not present

## 2024-01-03 DIAGNOSIS — H5711 Ocular pain, right eye: Secondary | ICD-10-CM | POA: Diagnosis not present

## 2024-01-03 DIAGNOSIS — Z8669 Personal history of other diseases of the nervous system and sense organs: Secondary | ICD-10-CM | POA: Diagnosis not present

## 2024-01-03 DIAGNOSIS — E119 Type 2 diabetes mellitus without complications: Secondary | ICD-10-CM | POA: Diagnosis not present

## 2024-01-06 ENCOUNTER — Other Ambulatory Visit: Payer: Self-pay

## 2024-01-06 DIAGNOSIS — H35352 Cystoid macular degeneration, left eye: Secondary | ICD-10-CM | POA: Diagnosis not present

## 2024-01-06 DIAGNOSIS — H3022 Posterior cyclitis, left eye: Secondary | ICD-10-CM | POA: Diagnosis not present

## 2024-01-06 DIAGNOSIS — H2012 Chronic iridocyclitis, left eye: Secondary | ICD-10-CM | POA: Diagnosis not present

## 2024-01-06 MED ORDER — ATROPINE SULFATE 1 % OP SOLN
OPHTHALMIC | 2 refills | Status: AC
  Filled 2024-01-06: qty 5, 28d supply, fill #0
  Filled 2024-01-17 – 2024-01-31 (×2): qty 5, 28d supply, fill #1

## 2024-01-06 MED ORDER — PREDNISOLONE ACETATE 1 % OP SUSP
1.0000 [drp] | OPHTHALMIC | 6 refills | Status: DC
  Filled 2024-01-06: qty 5, 9d supply, fill #0
  Filled 2024-01-17 – 2024-01-31 (×2): qty 5, 9d supply, fill #1
  Filled 2024-03-25: qty 5, 9d supply, fill #2

## 2024-01-08 DIAGNOSIS — M542 Cervicalgia: Secondary | ICD-10-CM | POA: Diagnosis not present

## 2024-01-08 DIAGNOSIS — M40203 Unspecified kyphosis, cervicothoracic region: Secondary | ICD-10-CM | POA: Diagnosis not present

## 2024-01-08 DIAGNOSIS — M4802 Spinal stenosis, cervical region: Secondary | ICD-10-CM | POA: Diagnosis not present

## 2024-01-08 DIAGNOSIS — M4803 Spinal stenosis, cervicothoracic region: Secondary | ICD-10-CM | POA: Diagnosis not present

## 2024-01-17 ENCOUNTER — Other Ambulatory Visit: Payer: Self-pay | Admitting: Family Medicine

## 2024-01-17 ENCOUNTER — Other Ambulatory Visit: Payer: Self-pay

## 2024-01-20 ENCOUNTER — Other Ambulatory Visit: Payer: Self-pay

## 2024-01-21 ENCOUNTER — Other Ambulatory Visit: Payer: Self-pay

## 2024-01-21 MED FILL — Atorvastatin Calcium Tab 40 MG (Base Equivalent): ORAL | 84 days supply | Qty: 12 | Fill #0 | Status: CN

## 2024-01-21 NOTE — Telephone Encounter (Signed)
 Requested Prescriptions  Pending Prescriptions Disp Refills   atorvastatin  (LIPITOR) 40 MG tablet 52 tablet 0    Sig: Take 1 tablet (40 mg total) by mouth once a week.     Cardiovascular:  Antilipid - Statins Failed - 01/21/2024  1:18 PM      Failed - Lipid Panel in normal range within the last 12 months    Cholesterol, Total  Date Value Ref Range Status  07/11/2023 237 (H) 100 - 199 mg/dL Final   Cholesterol Piccolo, Waived  Date Value Ref Range Status  08/22/2016 145 <200 mg/dL Final    Comment:                            Desirable                <200                         Borderline High      200- 239                         High                     >239    LDL Chol Calc (NIH)  Date Value Ref Range Status  07/11/2023 157 (H) 0 - 99 mg/dL Final   HDL  Date Value Ref Range Status  07/11/2023 52 >39 mg/dL Final   Triglycerides  Date Value Ref Range Status  07/11/2023 155 (H) 0 - 149 mg/dL Final   Triglycerides Piccolo,Waived  Date Value Ref Range Status  08/22/2016 339 (H) <150 mg/dL Final    Comment:                            Normal                   <150                         Borderline High     150 - 199                         High                200 - 499                         Very High                >499          Passed - Patient is not pregnant      Passed - Valid encounter within last 12 months    Recent Outpatient Visits           1 month ago Anxiety   [place] [facility], Megan P, DO   3 months ago Controlled type 2 diabetes mellitus with complication, without long-term current use of insulin  [facility])   [facility] [address], Megan P, DO   5 months ago OSA (obstructive sleep apnea)   [facility] [place], Megan P, DO

## 2024-01-27 ENCOUNTER — Other Ambulatory Visit: Payer: Self-pay

## 2024-01-27 NOTE — Therapy (Unsigned)
 OUTPATIENT OCCUPATIONAL THERAPY ORTHO EVALUATION  Patient Name: Tricia Hill MRN: [id number] DOB:[birth year], 53 y.o., female Today's Date: 01/28/2024  PCP: Dr Vicci MART PROVIDER: Dr Louis  END OF SESSION:  OT End of Session - 01/28/24 1401     Visit Number 1    Number of Visits 12    Date for OT Re-Evaluation 03/24/24    OT Start Time 1401    OT Stop Time 1446    OT Time Calculation (min) 45 min    Activity Tolerance Patient tolerated treatment well    Behavior During Therapy [facility] for tasks assessed/performed          Past Medical History:  Diagnosis Date   Anxiety    Arthritis    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   Asthma    Diabetes mellitus without complication (HCC) 2016   GERD (gastroesophageal reflux disease)    Heart murmur    child   History of kidney stones    Hypertension    Neuromuscular disorder (HCC)    right hands tingling and numbness   PONV (postoperative nausea and vomiting)    Shingles    Sleep apnea    USE CPAP   Past Surgical History:  Procedure Laterality Date   ANTERIOR CERVICAL DECOMP/DISCECTOMY FUSION N/A 04/13/2022   Procedure: Anterior Cervical Discectomy Fusion - Cervical seven-Thoracic one removal of plate;  Surgeon: Louis Shove, MD;  Location: [facility] OR;  Service: Neurosurgery;  Laterality: N/A;   Carpel Tunnel Surgery Right 10/01/2023   CESAREAN SECTION     X 2   CHOLECYSTECTOMY N/A 01/18/2017   Procedure: LAPAROSCOPIC CHOLECYSTECTOMY WITH INTRAOPERATIVE CHOLANGIOGRAM;  Surgeon: Dessa Reyes ORN, MD;  Location: [facility] ORS;  Service: General;  Laterality: N/A;   COLONOSCOPY WITH PROPOFOL  N/A 10/13/2020   Procedure: COLONOSCOPY WITH PROPOFOL ;  Surgeon: Unk Corinn Skiff, MD;  Location: [facility] ENDOSCOPY;  Service: Gastroenterology;  Laterality: N/A;  COVID POSITIVE 09/30/2020   CYSTOSCOPY/URETEROSCOPY/HOLMIUM LASER/STENT PLACEMENT Left 08/21/2022   Procedure: CYSTOSCOPY/URETEROSCOPY/HOLMIUM LASER/STENT PLACEMENT;  Surgeon:  Twylla Glendia BROCKS, MD;  Location: [facility] ORS;  Service: Urology;  Laterality: Left;   DIAGNOSTIC LAPAROSCOPY  2006   EXCISION OF ABDOMINAL WALL ENDOMETRIOMA   EXTRACORPOREAL SHOCK WAVE LITHOTRIPSY Left 07/12/2022   Procedure: EXTRACORPOREAL SHOCK WAVE LITHOTRIPSY (ESWL);  Surgeon: Penne Knee, MD;  Location: [facility] ORS;  Service: Urology;  Laterality: Left;   SPINAL FUSION  2012   C3 and C4   TOTAL ABDOMINAL HYSTERECTOMY     Partial   Patient Active Problem List   Diagnosis Date Noted   Chronic low back pain 12/03/2023   Disorder of right median nerve 12/03/2023   Lumbar radiculopathy 12/03/2023   Numbness of hand 12/03/2023   History of carpal tunnel surgery of right wrist 10/01/2023   Uveitis 07/11/2023   Numbness of upper limb 05/28/2023   Pain in right foot 05/28/2023   Cervical spondylosis with myelopathy and radiculopathy 04/13/2022   Osteoarthritis of spine with radiculopathy, cervical region 02/08/2022   Osteoarthritis of spine with radiculopathy, thoracic region 02/08/2022   Anxiety 09/05/2021   HTN (hypertension) 04/16/2018   Fatty liver 12/07/2016   Microscopic hematuria 05/26/2015   OSA (obstructive sleep apnea) 01/31/2015   S/P hysterectomy 01/31/2015   Controlled diabetes mellitus type 2 with complications (HCC) 01/31/2015   Morbid obesity (HCC) 01/31/2015   Hyperlipidemia associated with type 2 diabetes mellitus (HCC) 01/31/2015    ONSET DATE: 10/01/23  REFERRING DIAG: R CTR  THERAPY DIAG:  Scar tissue  Other disturbances  of skin sensation  Muscle weakness (generalized)  Stiffness of right wrist, not elsewhere classified  Rationale for Evaluation and Treatment: Rehabilitation  SUBJECTIVE:   SUBJECTIVE STATEMENT: I had carpal tunnel release.  But the symptoms feels like what I had when my neck gave me problems.  With the pain in the forearm.  The numbness in my hand comes and goes randomly.  Still tender over the scar when I hit it.  And it feels weak.   The pain goes from the scar up my forearm to the elbow.  I had a MRI some seeing the doctor Thursday to review that. Pt accompanied by: self  PERTINENT HISTORY: DR Louis note: The patient returns today in follow-up. She continues to have some right him discomfort as she recover some carpal tunnel surgery. She has not started her physical therapy. She also notes persistent posterior cervical pain with some radiation. She is having no definite weakness. Her lumbar pain is stable. She did not get much improvement following recent epidural steroid injection   PRECAUTIONS: None    WEIGHT BEARING RESTRICTIONS: No  PAIN:  Are you having pain? 1-4/10 carpal tunnel scar as well as in the volar forearm at times  FALLS: Has patient fallen in last 6 months? 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  But not at the moment on medical leave.  Does house stuff around with cooking and cleaning and laundry.  On her phone.  Reads some.  Has 2 grownup kids and a 58 -year-old-does a little flowers  PATIENT GOALS: I just want the numbness better and the pain but then also more strength so I can do my job as a Engineer, civil (consulting)  NEXT MD VISIT: 21 August  OBJECTIVE:  Note: Objective measures were completed at Evaluation unless otherwise noted.  HAND DOMINANCE: Right  ADLs: Difficulty with cutting food, cooking doing laundry.  Putting on jewelry, do buttons, opening jars or packages or Ziploc bags: Cannot grip or hold things I drop things  FUNCTIONAL OUTCOME MEASURES: To be done excision  UPPER EXTREMITY ROM:     Active ROM Right eval Left eval  Shoulder flexion    Shoulder abduction    Shoulder adduction    Shoulder extension    Shoulder internal rotation    Shoulder external rotation    Elbow flexion    Elbow extension    Wrist flexion 70   Wrist extension 64   Wrist ulnar deviation    Wrist radial deviation    Wrist pronation     Wrist supination    (Blank rows = not tested)  Active ROM Right eval Left eval  Thumb MCP (0-60)    Thumb IP (0-80)    Thumb Radial abd/add (0-55)     Thumb Palmar abd/add (0-45)     Thumb Opposition to Small Finger     Index MCP (0-90)     Index PIP (0-100)     Index DIP (0-70)      Long MCP (0-90)      Long PIP (0-100)      Long DIP (0-70)      Ring MCP (0-90)      Ring PIP (0-100)      Ring DIP (0-70)      Little MCP (0-90)      Little PIP (0-100)      Little DIP (0-70)      Digit  flexion within normal limits but tight when attempting to make a fist. Opposition within normal limits. Thumb palmar radial abduction within normal limits  HAND FUNCTION: Grip strength: Right: 27 lbs; Left: 50 lbs, Lateral pinch: Right: 4 lbs, Left: 12 lbs, and 3 point pinch: Right: 6 lbs, Left: 14 lbs  COORDINATION: Decrease in limited because of numbness in digits at times  SENSATION: Patient report increased numbness from thumb through fourth digit on and off during the day and night  EDEMA: Not noticeable.  COGNITION: Overall cognitive status: Within functional limits for tasks assessed      TREATMENT DATE: 01/28/24                                                                                                                            Modalities: Contrast bath:  Time: 8 Location right wrist and forearm Decrease stiffness, increase motion prior to review of home exercises  Reviewed with patient scar mobilization.  As well as [employer] -Care scar pad use at nighttime. Recommend for patient to wear at nighttime her wrist splint until next appointment Reviewed with patient tendon glides 12 reps Thumb palmar radial abduction 12 reps Wrist flexion extension active assisted range of motion 12 reps  2-3 times a day after contrast.  Patient to make a list of things that brings on numbness during the day.       PATIENT EDUCATION: Education details: findings of eval and HEP   Person educated: Patient Education method: Explanation, Demonstration, Tactile cues, Verbal cues, and Handouts Education comprehension: verbalized understanding, returned demonstration, verbal cues required, and needs further education   GOALS: Goals reviewed with patient? Yes LONG TERM GOALS: Target date: 8 weeks  Patient to be independent in home program to decrease scar tissue and tenderness to report decrease stiffness tightness and digit range of motion as well as individually tendon glides Baseline: Increased tightness and stiffness in digit flexion.  Scar thick and adhere as well as tender and pain when hitting no knowledge of scar mobilization and massage Goal status: INITIAL  2.  Wrist active range of motion improved to within normal limits symptom-free today able to push and pull heavy door. Baseline: Patient report pain and tenderness over carpal tunnel scar with pressure or hitting it as well as decreased wrist flexion extension unable to push or pull with hand has increased symptoms in the wrist and forearm Goal status: INITIAL  3.  Grip and prehension strength in the right improved to within normal range for her age for patient to be able to turn doorknob, cut with a knife, open Ziploc bags and do buttons symptom-free Baseline: Right grip 27 pounds left 50 pounds, lateral pinch right 4 pounds left 12 pounds and 3 point pinch right 6 pounds left 14 pounds Goal status: INITIAL  4.  Patient function on PRWHE improved with more than 10 points in ADLs and IADLs Baseline:  PRWHE score to be done next session Goal status:  INITIAL   ASSESSMENT:  CLINICAL IMPRESSION: Patient seen today for occupational therapy evaluation for right carpal tunnel release in April/25.  Patient present at OT evaluation with increased scar tissue with increased tenderness and pain in palm to volar forearm with use.  Patient report increased numbness from thumb through fourth digits during the day on and  off.  As well as nighttime.  Patient with increased stiffness tightness with digit flexion as well as decreased wrist flexion extension.  Patient with decreased grip and prehension strength on the right dominant hand.  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  Patient can benefit from skilled OT services to decrease scar tissue, pain and stiffness and increased motion and strength to return to prior level of function.  Patient did had a MRI to her cervical spine and is meeting with Dr. Louis this Thursday to review results.  PERFORMANCE DEFICITS: in functional skills including ADLs, IADLs, ROM, strength, pain, flexibility, decreased knowledge of use of DME, and UE functional use,   and psychosocial skills including environmental adaptation and routines and behaviors.   IMPAIRMENTS: are limiting patient from ADLs, IADLs, rest and sleep, play, leisure, and social participation.   COMORBIDITIES: has no other co-morbidities that affects occupational performance. Patient will benefit from skilled OT to address above impairments and improve overall function.  MODIFICATION OR ASSISTANCE TO COMPLETE EVALUATION: No modification of tasks or assist necessary to complete an evaluation.  OT OCCUPATIONAL PROFILE AND HISTORY: Problem focused assessment: Including review of records relating to presenting problem.  CLINICAL DECISION MAKING: LOW - limited treatment options, no task modification necessary  REHAB POTENTIAL: Good for goals  EVALUATION COMPLEXITY: Low      PLAN:  OT FREQUENCY: 1-2x/week  OT DURATION: 6 weeks  PLANNED INTERVENTIONS: 97168 OT Re-evaluation, 97535 self care/ADL training, 02889 therapeutic exercise, 97530 therapeutic activity, 97112 neuromuscular re-education, 97140 manual therapy, 97035 ultrasound, 97018 paraffin, 02960 fluidotherapy, 97034 contrast bath, scar mobilization, passive range of  motion, patient/family education, and DME and/or AE instructions    CONSULTED AND AGREED WITH PLAN OF CARE: Patient    Ancel Peters, OTR/L,CLT 01/28/2024, 5:41 PM

## 2024-01-28 ENCOUNTER — Encounter: Payer: Self-pay | Admitting: Occupational Therapy

## 2024-01-28 ENCOUNTER — Ambulatory Visit: Attending: Neurosurgery | Admitting: Occupational Therapy

## 2024-01-28 DIAGNOSIS — M6281 Muscle weakness (generalized): Secondary | ICD-10-CM | POA: Diagnosis present

## 2024-01-28 DIAGNOSIS — L905 Scar conditions and fibrosis of skin: Secondary | ICD-10-CM | POA: Insufficient documentation

## 2024-01-28 DIAGNOSIS — M25631 Stiffness of right wrist, not elsewhere classified: Secondary | ICD-10-CM | POA: Insufficient documentation

## 2024-01-28 DIAGNOSIS — R208 Other disturbances of skin sensation: Secondary | ICD-10-CM | POA: Insufficient documentation

## 2024-01-30 DIAGNOSIS — M4802 Spinal stenosis, cervical region: Secondary | ICD-10-CM | POA: Diagnosis not present

## 2024-01-30 DIAGNOSIS — Z6841 Body Mass Index (BMI) 40.0 and over, adult: Secondary | ICD-10-CM | POA: Diagnosis not present

## 2024-01-31 ENCOUNTER — Other Ambulatory Visit: Payer: Self-pay

## 2024-01-31 ENCOUNTER — Ambulatory Visit: Admitting: Occupational Therapy

## 2024-01-31 DIAGNOSIS — L905 Scar conditions and fibrosis of skin: Secondary | ICD-10-CM

## 2024-01-31 DIAGNOSIS — M6281 Muscle weakness (generalized): Secondary | ICD-10-CM

## 2024-01-31 DIAGNOSIS — R208 Other disturbances of skin sensation: Secondary | ICD-10-CM

## 2024-01-31 DIAGNOSIS — M25631 Stiffness of right wrist, not elsewhere classified: Secondary | ICD-10-CM

## 2024-01-31 NOTE — Therapy (Signed)
 OUTPATIENT OCCUPATIONAL THERAPY ORTHO TREATMENT  Patient Name: Tricia Hill MRN: [id number] DOB:[birth year], 53 y.o., female Today's Date: 01/31/2024  PCP: Dr Vicci MART PROVIDER: Dr Louis  END OF SESSION:  OT End of Session - 01/31/24 0908     Visit Number 2    Number of Visits 12    Date for OT Re-Evaluation 03/24/24    OT Start Time 0908    OT Stop Time 0950    OT Time Calculation (min) 42 min    Activity Tolerance Patient tolerated treatment well    Behavior During Therapy [facility] for tasks assessed/performed          Past Medical History:  Diagnosis Date   Anxiety    Arthritis    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   Asthma    Diabetes mellitus without complication (HCC) 2016   GERD (gastroesophageal reflux disease)    Heart murmur    child   History of kidney stones    Hypertension    Neuromuscular disorder (HCC)    right hands tingling and numbness   PONV (postoperative nausea and vomiting)    Shingles    Sleep apnea    USE CPAP   Past Surgical History:  Procedure Laterality Date   ANTERIOR CERVICAL DECOMP/DISCECTOMY FUSION N/A 04/13/2022   Procedure: Anterior Cervical Discectomy Fusion - Cervical seven-Thoracic one removal of plate;  Surgeon: Louis Shove, MD;  Location: [facility] OR;  Service: Neurosurgery;  Laterality: N/A;   Carpel Tunnel Surgery Right 10/01/2023   CESAREAN SECTION     X 2   CHOLECYSTECTOMY N/A 01/18/2017   Procedure: LAPAROSCOPIC CHOLECYSTECTOMY WITH INTRAOPERATIVE CHOLANGIOGRAM;  Surgeon: Dessa Reyes ORN, MD;  Location: [facility] ORS;  Service: General;  Laterality: N/A;   COLONOSCOPY WITH PROPOFOL  N/A 10/13/2020   Procedure: COLONOSCOPY WITH PROPOFOL ;  Surgeon: Unk Corinn Skiff, MD;  Location: [facility] ENDOSCOPY;  Service: Gastroenterology;  Laterality: N/A;  COVID POSITIVE 09/30/2020   CYSTOSCOPY/URETEROSCOPY/HOLMIUM LASER/STENT PLACEMENT Left 08/21/2022   Procedure: CYSTOSCOPY/URETEROSCOPY/HOLMIUM LASER/STENT PLACEMENT;  Surgeon: Twylla Glendia BROCKS, MD;  Location: [facility] ORS;  Service: Urology;  Laterality: Left;   DIAGNOSTIC LAPAROSCOPY  2006   EXCISION OF ABDOMINAL WALL ENDOMETRIOMA   EXTRACORPOREAL SHOCK WAVE LITHOTRIPSY Left 07/12/2022   Procedure: EXTRACORPOREAL SHOCK WAVE LITHOTRIPSY (ESWL);  Surgeon: Penne Knee, MD;  Location: [facility] ORS;  Service: Urology;  Laterality: Left;   SPINAL FUSION  2012   C3 and C4   TOTAL ABDOMINAL HYSTERECTOMY     Partial   Patient Active Problem List   Diagnosis Date Noted   Chronic low back pain 12/03/2023   Disorder of right median nerve 12/03/2023   Lumbar radiculopathy 12/03/2023   Numbness of hand 12/03/2023   History of carpal tunnel surgery of right wrist 10/01/2023   Uveitis 07/11/2023   Numbness of upper limb 05/28/2023   Pain in right foot 05/28/2023   Cervical spondylosis with myelopathy and radiculopathy 04/13/2022   Osteoarthritis of spine with radiculopathy, cervical region 02/08/2022   Osteoarthritis of spine with radiculopathy, thoracic region 02/08/2022   Anxiety 09/05/2021   HTN (hypertension) 04/16/2018   Fatty liver 12/07/2016   Microscopic hematuria 05/26/2015   OSA (obstructive sleep apnea) 01/31/2015   S/P hysterectomy 01/31/2015   Controlled diabetes mellitus type 2 with complications (HCC) 01/31/2015   Morbid obesity (HCC) 01/31/2015   Hyperlipidemia associated with type 2 diabetes mellitus (HCC) 01/31/2015    ONSET DATE: 10/01/23  REFERRING DIAG: R CTR  THERAPY DIAG:  Scar tissue  Other disturbances  of skin sensation  Muscle weakness (generalized)  Stiffness of right wrist, not elsewhere classified  Rationale for Evaluation and Treatment: Rehabilitation  SUBJECTIVE:   SUBJECTIVE STATEMENT: I did see the neurosurgeon yesterday.  My fusion with my neck looked good in the MRI but there is a area of concern above it.  So may be to order me a CT scan.  My lower back bothers me and goes into my legs.  I was planning to go back to work 1  September.  But I forgot to talk with him about light duty.  My daughter is helping me with the massaging to my scar and hand. Pt accompanied by: self  PERTINENT HISTORY: DR Louis note: The patient returns today in follow-up. She continues to have some right him discomfort as she recover some carpal tunnel surgery. She has not started her physical therapy. She also notes persistent posterior cervical pain with some radiation. She is having no definite weakness. Her lumbar pain is stable. She did not get much improvement following recent epidural steroid injection   PRECAUTIONS: None    WEIGHT BEARING RESTRICTIONS: No  PAIN:  Are you having pain? 1-4/10 carpal tunnel scar as well as in the volar forearm at times  FALLS: Has patient fallen in last 6 months? 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  Does house stuff around with cooking and cleaning and laundry.  On her phone.  Reads some.  Has 2 grownup kids and a 41 -year-old-does a little flowers  PATIENT GOALS: I just want the numbness better and the pain but then also more strength so I can do my job as a Engineer, civil (consulting)  NEXT MD VISIT: 21 August  OBJECTIVE:  Note: Objective measures were completed at Evaluation unless otherwise noted.  HAND DOMINANCE: Right  ADLs: Difficulty with cutting food, cooking doing laundry.  Putting on jewelry, do buttons, opening jars or packages or Ziploc bags: Cannot grip or hold things I drop things  FUNCTIONAL OUTCOME MEASURES: To be done excision  UPPER EXTREMITY ROM:     Active ROM Right eval Left eval  Shoulder flexion    Shoulder abduction    Shoulder adduction    Shoulder extension    Shoulder internal rotation    Shoulder external rotation    Elbow flexion    Elbow extension    Wrist flexion 70   Wrist extension 64   Wrist ulnar deviation    Wrist radial deviation    Wrist  pronation    Wrist supination    (Blank rows = not tested)  Active ROM Right eval Left eval  Thumb MCP (0-60)    Thumb IP (0-80)    Thumb Radial abd/add (0-55)     Thumb Palmar abd/add (0-45)     Thumb Opposition to Small Finger     Index MCP (0-90)     Index PIP (0-100)     Index DIP (0-70)      Long MCP (0-90)      Long PIP (0-100)      Long DIP (0-70)      Ring MCP (0-90)      Ring PIP (0-100)      Ring DIP (0-70)      Little MCP (0-90)      Little PIP (0-100)      Little DIP (0-70)  Digit flexion within normal limits but tight when attempting to make a fist. Opposition within normal limits. Thumb palmar radial abduction within normal limits  HAND FUNCTION: Grip strength: Right: 27 lbs; Left: 50 lbs, Lateral pinch: Right: 4 lbs, Left: 12 lbs, and 3 point pinch: Right: 6 lbs, Left: 14 lbs  COORDINATION: Decrease in limited because of numbness in digits at times  SENSATION: Patient report increased numbness from thumb through fourth digit on and off during the day and night  EDEMA: Not noticeable.  COGNITION: Overall cognitive status: Within functional limits for tasks assessed      TREATMENT DATE: 01/31/24                                                                                                                            Modalities: FLuidotherapy:  Time: 8 Location right hand, wrist and forearm Decrease stiffness, increase motion prior to manual therapy and therapist   Soft tissue mobilization done with Graston tool #2 over right volar forearm as well as palm.  Increased trigger points and tenderness over volar forearm. Done static dry cupping 5 cm size mid and proximal fifth volar forearm 3 times a day combination with wrist flexion extension 15 reps each time with great results; as well as 2 repetitions of dry static On palm and forearm in combination with wrist extension and digit extension 10 reps repeated 2 times. Patient tolerated well.  With  great results.  In wrist extension and digit extension  Reviewed with patient scar mobilization again.  As well as Cica -Care scar pad use at nighttime. Scar mobilization done with gentle brushing strokes of Graston tool #2.  As well as mini massager as well as MC and Carpal spreads 12 reps by OT -familly to cont as well as pt rolling over foam roller on palm and volar forearm prior to range of motion  Recommend for patient to wear at nighttime her wrist splint  Patient less numbness reporting since last visit Reviewed with patient tendon glides 12 reps Thumb palmar radial abduction 12 reps At a rubber band for resistance for palmar radial abduction 12 reps tolerated well Wrist flexion extension active assisted range of motion 12 reps Added closing clips 2 pounds for lateral pinch to maintain wrist straight and neutral 12 reps and 3 point pinch 4 pounds patient had some discomfort on the volar wrist when wrist was in extension with 3-point pinch. Change 3 point pinch to wrist neutral pinching down on 4 pounds if pain continues switch back to 2 pounds 12 reps  2-3 times a day after contrast.  Patient to make a list of things that brings on numbness during the day.       PATIENT EDUCATION: Education details: findings of eval and HEP  Person educated: Patient Education method: Explanation, Demonstration, Tactile cues, Verbal cues, and Handouts Education comprehension: verbalized understanding, returned demonstration, verbal cues required, and needs further education   GOALS: Goals reviewed with  patient? Yes LONG TERM GOALS: Target date: 8 weeks  Patient to be independent in home program to decrease scar tissue and tenderness to report decrease stiffness tightness and digit range of motion as well as individually tendon glides Baseline: Increased tightness and stiffness in digit flexion.  Scar thick and adhere as well as tender and pain when hitting no knowledge of scar mobilization  and massage Goal status: INITIAL  2.  Wrist active range of motion improved to within normal limits symptom-free today able to push and pull heavy door. Baseline: Patient report pain and tenderness over carpal tunnel scar with pressure or hitting it as well as decreased wrist flexion extension unable to push or pull with hand has increased symptoms in the wrist and forearm Goal status: INITIAL  3.  Grip and prehension strength in the right improved to within normal range for her age for patient to be able to turn doorknob, cut with a knife, open Ziploc bags and do buttons symptom-free Baseline: Right grip 27 pounds left 50 pounds, lateral pinch right 4 pounds left 12 pounds and 3 point pinch right 6 pounds left 14 pounds Goal status: INITIAL  4.  Patient function on PRWHE improved with more than 10 points in ADLs and IADLs Baseline:  PRWHE score to be done next session Goal status: INITIAL   ASSESSMENT:  CLINICAL IMPRESSION: Patient seen today for occupational therapy evaluation for right carpal tunnel release in April/25.  Patient present at OT evaluation with increased scar tissue with increased tenderness and pain in palm to volar forearm with use.  Patient report increased numbness from thumb through fourth digits during the day on and off.  As well as nighttime.  Patient with increased stiffness tightness with digit flexion as well as decreased wrist flexion extension.  Patient with decreased grip and prehension strength on the right dominant hand.  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  NOW patient responded great on cupping and soft tissue mobilization on volar forearm and palm with increased digit and wrist extension.  Patient also tolerated initiating strengthening for thumb palmar radial abduction as well as prehension.  Held off on gripping because of tenderness and scar adhesions.  Patient  can benefit from skilled OT services to decrease scar tissue, pain and stiffness and increased motion and strength to return to prior level of function.  Patient did had a MRI to her cervical spine and is meeting with Dr. Louis this Thursday to review results.  PERFORMANCE DEFICITS: in functional skills including ADLs, IADLs, ROM, strength, pain, flexibility, decreased knowledge of use of DME, and UE functional use,   and psychosocial skills including environmental adaptation and routines and behaviors.   IMPAIRMENTS: are limiting patient from ADLs, IADLs, rest and sleep, play, leisure, and social participation.   COMORBIDITIES: has no other co-morbidities that affects occupational performance. Patient will benefit from skilled OT to address above impairments and improve overall function.  MODIFICATION OR ASSISTANCE TO COMPLETE EVALUATION: No modification of tasks or assist necessary to complete an evaluation.  OT OCCUPATIONAL PROFILE AND HISTORY: Problem focused assessment: Including review of records relating to presenting problem.  CLINICAL DECISION MAKING: LOW - limited treatment options, no task modification necessary  REHAB POTENTIAL: Good for goals  EVALUATION COMPLEXITY: Low      PLAN:  OT FREQUENCY: 1-2x/week  OT DURATION: 6 weeks  PLANNED INTERVENTIONS: 02831 OT Re-evaluation, 97535 self care/ADL  training, 02889 therapeutic exercise, 97530 therapeutic activity, 97112 neuromuscular re-education, 97140 manual therapy, 97035 ultrasound, 97018 paraffin, 02960 fluidotherapy, 97034 contrast bath, scar mobilization, passive range of motion, patient/family education, and DME and/or AE instructions    CONSULTED AND AGREED WITH PLAN OF CARE: Patient    Ancel Peters, OTR/L,CLT 01/31/2024, 12:50 PM

## 2024-02-04 ENCOUNTER — Other Ambulatory Visit: Payer: Self-pay | Admitting: Neurosurgery

## 2024-02-04 DIAGNOSIS — M4802 Spinal stenosis, cervical region: Secondary | ICD-10-CM

## 2024-02-05 ENCOUNTER — Other Ambulatory Visit: Payer: Self-pay

## 2024-02-05 DIAGNOSIS — H35351 Cystoid macular degeneration, right eye: Secondary | ICD-10-CM | POA: Diagnosis not present

## 2024-02-05 DIAGNOSIS — H2012 Chronic iridocyclitis, left eye: Secondary | ICD-10-CM | POA: Insufficient documentation

## 2024-02-05 LAB — HM DIABETES EYE EXAM

## 2024-02-05 MED ORDER — KETOROLAC TROMETHAMINE 0.5 % OP SOLN
1.0000 [drp] | Freq: Four times a day (QID) | OPHTHALMIC | 3 refills | Status: AC
  Filled 2024-02-05: qty 5, 25d supply, fill #0
  Filled 2024-03-25: qty 5, 25d supply, fill #1
  Filled 2024-04-29 – 2024-06-02 (×4): qty 5, 25d supply, fill #2

## 2024-02-05 MED ORDER — DORZOLAMIDE HCL-TIMOLOL MAL 2-0.5 % OP SOLN
1.0000 [drp] | Freq: Four times a day (QID) | OPHTHALMIC | 11 refills | Status: AC
  Filled 2024-02-05: qty 10, 25d supply, fill #0
  Filled 2024-03-25: qty 10, 25d supply, fill #1
  Filled 2024-04-29: qty 10, 25d supply, fill #2
  Filled 2024-06-02: qty 10, 25d supply, fill #3
  Filled 2024-06-23: qty 10, 25d supply, fill #4

## 2024-02-06 ENCOUNTER — Other Ambulatory Visit: Payer: Self-pay

## 2024-02-06 ENCOUNTER — Ambulatory Visit: Admitting: Occupational Therapy

## 2024-02-06 ENCOUNTER — Other Ambulatory Visit

## 2024-02-06 DIAGNOSIS — L905 Scar conditions and fibrosis of skin: Secondary | ICD-10-CM

## 2024-02-06 DIAGNOSIS — M25631 Stiffness of right wrist, not elsewhere classified: Secondary | ICD-10-CM

## 2024-02-06 DIAGNOSIS — R208 Other disturbances of skin sensation: Secondary | ICD-10-CM

## 2024-02-06 DIAGNOSIS — M6281 Muscle weakness (generalized): Secondary | ICD-10-CM

## 2024-02-06 NOTE — Therapy (Signed)
 OUTPATIENT OCCUPATIONAL THERAPY ORTHO TREATMENT  Patient Name: Tricia Hill MRN: [id number] DOB:1970-12-18, 53 y.o., female Today's Date: 02/06/2024  PCP: Dr Vicci MART PROVIDER: Dr Louis  END OF SESSION:  OT End of Session - 02/06/24 0859     Visit Number 3    Number of Visits 12    Date for OT Re-Evaluation 03/24/24    OT Start Time 0900    Activity Tolerance Patient tolerated treatment well    Behavior During Therapy [facility] for tasks assessed/performed          Past Medical History:  Diagnosis Date   Anxiety    Arthritis    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   Asthma    Diabetes mellitus without complication (HCC) 2016   GERD (gastroesophageal reflux disease)    Heart murmur    child   History of kidney stones    Hypertension    Neuromuscular disorder (HCC)    right hands tingling and numbness   PONV (postoperative nausea and vomiting)    Shingles    Sleep apnea    USE CPAP   Past Surgical History:  Procedure Laterality Date   ANTERIOR CERVICAL DECOMP/DISCECTOMY FUSION N/A 04/13/2022   Procedure: Anterior Cervical Discectomy Fusion - Cervical seven-Thoracic one removal of plate;  Surgeon: Louis Shove, MD;  Location: [facility] OR;  Service: Neurosurgery;  Laterality: N/A;   Carpel Tunnel Surgery Right 10/01/2023   CESAREAN SECTION     X 2   CHOLECYSTECTOMY N/A 01/18/2017   Procedure: LAPAROSCOPIC CHOLECYSTECTOMY WITH INTRAOPERATIVE CHOLANGIOGRAM;  Surgeon: Dessa Reyes ORN, MD;  Location: [facility] ORS;  Service: General;  Laterality: N/A;   COLONOSCOPY WITH PROPOFOL  N/A 10/13/2020   Procedure: COLONOSCOPY WITH PROPOFOL ;  Surgeon: Unk Corinn Skiff, MD;  Location: [facility] ENDOSCOPY;  Service: Gastroenterology;  Laterality: N/A;  COVID POSITIVE 09/30/2020   CYSTOSCOPY/URETEROSCOPY/HOLMIUM LASER/STENT PLACEMENT Left 08/21/2022   Procedure: CYSTOSCOPY/URETEROSCOPY/HOLMIUM LASER/STENT PLACEMENT;  Surgeon: Twylla Glendia BROCKS, MD;  Location: [facility] ORS;  Service: Urology;   Laterality: Left;   DIAGNOSTIC LAPAROSCOPY  2006   EXCISION OF ABDOMINAL WALL ENDOMETRIOMA   EXTRACORPOREAL SHOCK WAVE LITHOTRIPSY Left 07/12/2022   Procedure: EXTRACORPOREAL SHOCK WAVE LITHOTRIPSY (ESWL);  Surgeon: Penne Knee, MD;  Location: [facility] ORS;  Service: Urology;  Laterality: Left;   SPINAL FUSION  2012   C3 and C4   TOTAL ABDOMINAL HYSTERECTOMY     Partial   Patient Active Problem List   Diagnosis Date Noted   Chronic low back pain 12/03/2023   Disorder of right median nerve 12/03/2023   Lumbar radiculopathy 12/03/2023   Numbness of hand 12/03/2023   History of carpal tunnel surgery of right wrist 10/01/2023   Uveitis 07/11/2023   Numbness of upper limb 05/28/2023   Pain in right foot 05/28/2023   Cervical spondylosis with myelopathy and radiculopathy 04/13/2022   Osteoarthritis of spine with radiculopathy, cervical region 02/08/2022   Osteoarthritis of spine with radiculopathy, thoracic region 02/08/2022   Anxiety 09/05/2021   HTN (hypertension) 04/16/2018   Fatty liver 12/07/2016   Microscopic hematuria 05/26/2015   OSA (obstructive sleep apnea) 01/31/2015   S/P hysterectomy 01/31/2015   Controlled diabetes mellitus type 2 with complications (HCC) 01/31/2015   Morbid obesity (HCC) 01/31/2015   Hyperlipidemia associated with type 2 diabetes mellitus (HCC) 01/31/2015    ONSET DATE: 10/01/23  REFERRING DIAG: R CTR  THERAPY DIAG:  Scar tissue  Other disturbances of skin sensation  Muscle weakness (generalized)  Stiffness of right wrist, not elsewhere classified  Rationale for Evaluation and Treatment: Rehabilitation  SUBJECTIVE:   SUBJECTIVE STATEMENT: Pt reports mild numbness in R digits coming and going - had some discomfort last night after driving a long time and gripping steering wheel to tight   PERTINENT HISTORY: DR Louis note: The patient returns today in follow-up. She continues to have some right him discomfort as she recover some carpal  tunnel surgery. She has not started her physical therapy. She also notes persistent posterior cervical pain with some radiation. She is having no definite weakness. Her lumbar pain is stable. She did not get much improvement following recent epidural steroid injection   PRECAUTIONS: None    WEIGHT BEARING RESTRICTIONS: No  PAIN:  Are you having pain? 0/10   FALLS: Has patient fallen in last 6 months? 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  Does house stuff around with cooking and cleaning and laundry.  On her phone.  Reads some.  Has 2 grownup kids and a 33 -year-old-does a little flowers  PATIENT GOALS: I just want the numbness better and the pain but then also more strength so I can do my job as a Engineer, civil (consulting)  NEXT MD VISIT: 21 August  OBJECTIVE:  Note: Objective measures were completed at Evaluation unless otherwise noted.  HAND DOMINANCE: Right  ADLs: Difficulty with cutting food, cooking doing laundry.  Putting on jewelry, do buttons, opening jars or packages or Ziploc bags: Cannot grip or hold things I drop things  FUNCTIONAL OUTCOME MEASURES: To be done excision  UPPER EXTREMITY ROM:     Active ROM Right eval Left eval  Shoulder flexion    Shoulder abduction    Shoulder adduction    Shoulder extension    Shoulder internal rotation    Shoulder external rotation    Elbow flexion    Elbow extension    Wrist flexion 70   Wrist extension 64   Wrist ulnar deviation    Wrist radial deviation    Wrist pronation    Wrist supination    (Blank rows = not tested)  Active ROM Right eval Left eval  Thumb MCP (0-60)    Thumb IP (0-80)    Thumb Radial abd/add (0-55)     Thumb Palmar abd/add (0-45)     Thumb Opposition to Small Finger     Index MCP (0-90)     Index PIP (0-100)     Index DIP (0-70)      Long MCP (0-90)      Long PIP (0-100)      Long  DIP (0-70)      Ring MCP (0-90)      Ring PIP (0-100)      Ring DIP (0-70)      Little MCP (0-90)      Little PIP (0-100)      Little DIP (0-70)      Digit flexion within normal limits but tight when attempting to make a fist. Opposition within normal limits. Thumb palmar radial abduction within normal limits  HAND FUNCTION: Grip strength: Right: 27 lbs; Left: 50 lbs, Lateral pinch: Right: 4 lbs, Left: 12 lbs, and 3 point pinch: Right: 6 lbs, Left: 14 lbs 02/06/24: Grip strength: Right: 29 lbs; Left: 50 lbs, Lateral pinch: Right: 4 lbs, Left: 12 lbs, and 3 point pinch: Right: 6 lbs, Left: 14 lbs  COORDINATION: Decrease in limited because of numbness in digits at times  SENSATION: Patient report increased numbness from thumb through fourth digit on and off during the day and night  EDEMA: Not noticeable.  COGNITION: Overall cognitive status: Within functional limits for tasks assessed      TREATMENT DATE: 02/06/24                                                                                                                            Modalities: Paraffin:  Time: 8 Location right hand, wrist and forearm Decrease stiffness, increase motion prior to manual therapy and therapist   Soft tissue mobilization done with Graston tool #2 over right volar forearm as well as palm.  Increased trigger points and tenderness over volar forearm. Tolerated static dry cupping 5 cm size mid and proximal fifth volar forearm 3 times a day combination with wrist flexion extension 15 reps each time with great results; as well as 2 repetitions of dry static On palm and forearm in combination with wrist extension and digit extension 10 reps repeated 2 times. Tolerated static light dry cupping at 5 cm size placed on volar forearm in combination with wrist flexion/extension.   Reviewed with patient scar mobilization again.  Ongoing Cica -Care scar pad use at nighttime. Scar mobilization done with  gentle brushing strokes of Graston tool #2.  Tolerated mini massager as well as MC and Carpal spreads x12 reps.  Reports family assisting with scar massage at home and foam roller on palm and volar forearm prior to range of motion - reviewed technique.  Recommend for patient to wear her wrist splint at nighttime Reviewed HEP: tendon glides 12 reps Rubber band for resistance for palmar radial abduction 12 reps tolerated well 2-3 sets of 12 Wrist flexion extension active assisted range of motion 12 reps  Replaced clothespins with theraputty exercises for HEP - provided soft light blue putty.  -12 reps each: gripping ,  key pinch, rolling forearm, and 3 pt pinch. - can roll inbetween if feel some tightness or spasm   - educated on completing putty exercises12 reps 2  x day - can increase to 2nd set over weeknd if symptoms free   Cont 1 lbs weight 3 sets of 12 symptom free   Plan to complete HEP 2-3 times a day after contrast bath Patient to make a list of things that brings on numbness during the day.       PATIENT EDUCATION: Education details: findings of eval and HEP  Person educated: Patient Education method: Explanation, Demonstration, Tactile cues, Verbal cues, and Handouts Education comprehension: verbalized understanding, returned demonstration, verbal cues required, and needs further education   GOALS: Goals reviewed with patient? Yes LONG TERM GOALS: Target date: 8 weeks  Patient to be independent in home program to decrease scar tissue and tenderness to report decrease stiffness tightness and digit range of motion as well as individually tendon glides Baseline: Increased tightness and stiffness  in digit flexion.  Scar thick and adhere as well as tender and pain when hitting no knowledge of scar mobilization and massage Goal status: INITIAL  2.  Wrist active range of motion improved to within normal limits symptom-free today able to push and pull heavy door. Baseline:  Patient report pain and tenderness over carpal tunnel scar with pressure or hitting it as well as decreased wrist flexion extension unable to push or pull with hand has increased symptoms in the wrist and forearm Goal status: INITIAL  3.  Grip and prehension strength in the right improved to within normal range for her age for patient to be able to turn doorknob, cut with a knife, open Ziploc bags and do buttons symptom-free Baseline: Right grip 27 pounds left 50 pounds, lateral pinch right 4 pounds left 12 pounds and 3 point pinch right 6 pounds left 14 pounds Goal status: INITIAL  4.  Patient function on PRWHE improved with more than 10 points in ADLs and IADLs Baseline:  PRWHE score to be done next session Goal status: INITIAL   ASSESSMENT:  CLINICAL IMPRESSION: Continues to wear brace at nighttime, reports mild numbness to R 1-3 digits and pain with driving. Patient present at OT evaluation with increased scar tissue with increased tenderness and pain in palm to volar forearm with use. Patient with increased stiffness tightness with digit flexion as well as decreased wrist flexion extension.  Patient continues with decreased grip and prehension strength on the right dominant hand.  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  This session patient responded well to paraffin bath and theraputty with no pain. Increase motion and decrease pain and numbness reported - Tolerated manual therapy with increased digit and wrist extension. Patient can benefit from skilled OT services to decrease scar tissue, pain and stiffness and increased motion and strength to return to prior level of function.  Patient did had a MRI to her cervical spine and is meeting with Dr. Louis this Thursday to review results.  PERFORMANCE DEFICITS: in functional skills including ADLs, IADLs, ROM, strength, pain, flexibility, decreased  knowledge of use of DME, and UE functional use,   and psychosocial skills including environmental adaptation and routines and behaviors.   IMPAIRMENTS: are limiting patient from ADLs, IADLs, rest and sleep, play, leisure, and social participation.   COMORBIDITIES: has no other co-morbidities that affects occupational performance. Patient will benefit from skilled OT to address above impairments and improve overall function.  MODIFICATION OR ASSISTANCE TO COMPLETE EVALUATION: No modification of tasks or assist necessary to complete an evaluation.  OT OCCUPATIONAL PROFILE AND HISTORY: Problem focused assessment: Including review of records relating to presenting problem.  CLINICAL DECISION MAKING: LOW - limited treatment options, no task modification necessary  REHAB POTENTIAL: Good for goals  EVALUATION COMPLEXITY: Low      PLAN:  OT FREQUENCY: 1-2x/week  OT DURATION: 6 weeks  PLANNED INTERVENTIONS: 97168 OT Re-evaluation, 97535 self care/ADL training, 02889 therapeutic exercise, 97530 therapeutic activity, 97112 neuromuscular re-education, 97140 manual therapy, 97035 ultrasound, 97018 paraffin, 02960 fluidotherapy, 97034 contrast bath, scar mobilization, passive range of motion, patient/family education, and DME and/or AE instructions    CONSULTED AND AGREED WITH PLAN OF CARE: Patient    Ancel Peters, OTR/L,CLT 02/06/2024, 9:00 AM

## 2024-02-07 ENCOUNTER — Other Ambulatory Visit: Payer: Self-pay

## 2024-02-07 ENCOUNTER — Ambulatory Visit (INDEPENDENT_AMBULATORY_CARE_PROVIDER_SITE_OTHER): Admitting: Family Medicine

## 2024-02-07 ENCOUNTER — Encounter: Payer: Self-pay | Admitting: Family Medicine

## 2024-02-07 VITALS — BP 135/72 | HR 71 | Temp 98.0°F | Ht 59.0 in | Wt 227.6 lb

## 2024-02-07 DIAGNOSIS — Z0000 Encounter for general adult medical examination without abnormal findings: Secondary | ICD-10-CM

## 2024-02-07 DIAGNOSIS — Z23 Encounter for immunization: Secondary | ICD-10-CM | POA: Diagnosis not present

## 2024-02-07 DIAGNOSIS — Z1231 Encounter for screening mammogram for malignant neoplasm of breast: Secondary | ICD-10-CM

## 2024-02-07 DIAGNOSIS — E785 Hyperlipidemia, unspecified: Secondary | ICD-10-CM | POA: Diagnosis not present

## 2024-02-07 DIAGNOSIS — Z7985 Long-term (current) use of injectable non-insulin antidiabetic drugs: Secondary | ICD-10-CM

## 2024-02-07 DIAGNOSIS — I10 Essential (primary) hypertension: Secondary | ICD-10-CM | POA: Diagnosis not present

## 2024-02-07 DIAGNOSIS — E118 Type 2 diabetes mellitus with unspecified complications: Secondary | ICD-10-CM

## 2024-02-07 DIAGNOSIS — E1169 Type 2 diabetes mellitus with other specified complication: Secondary | ICD-10-CM | POA: Diagnosis not present

## 2024-02-07 LAB — MICROALBUMIN, URINE WAIVED
Creatinine, Urine Waived: 100 mg/dL (ref 10–300)
Microalb, Ur Waived: 10 mg/L (ref 0–19)
Microalb/Creat Ratio: <30 mg/g (ref <30)

## 2024-02-07 LAB — BAYER DCA HB A1C WAIVED: HB A1C (BAYER DCA - WAIVED): 6.6 % — ABNORMAL HIGH (ref 4.8–5.6)

## 2024-02-07 MED ORDER — ESCITALOPRAM OXALATE 5 MG PO TABS
5.0000 mg | ORAL_TABLET | Freq: Every day | ORAL | 0 refills | Status: DC
  Filled 2024-02-07 – 2024-03-25 (×2): qty 90, 90d supply, fill #0

## 2024-02-07 MED ORDER — TIRZEPATIDE 12.5 MG/0.5ML ~~LOC~~ SOAJ
12.5000 mg | SUBCUTANEOUS | 1 refills | Status: AC
  Filled 2024-02-07: qty 6, 84d supply, fill #0
  Filled 2024-03-25: qty 2, 28d supply, fill #0
  Filled 2024-04-29: qty 2, 28d supply, fill #1
  Filled 2024-06-02: qty 2, 28d supply, fill #2

## 2024-02-07 MED ORDER — TRAZODONE HCL 50 MG PO TABS
25.0000 mg | ORAL_TABLET | Freq: Every evening | ORAL | 1 refills | Status: AC | PRN
  Filled 2024-02-07: qty 90, 90d supply, fill #0

## 2024-02-07 MED ORDER — GABAPENTIN 100 MG PO CAPS
100.0000 mg | ORAL_CAPSULE | Freq: Three times a day (TID) | ORAL | 1 refills | Status: AC
  Filled 2024-02-07 – 2024-03-25 (×2): qty 270, 90d supply, fill #0
  Filled 2024-06-23: qty 270, 90d supply, fill #1

## 2024-02-07 MED ORDER — LOSARTAN POTASSIUM 25 MG PO TABS
12.5000 mg | ORAL_TABLET | Freq: Every day | ORAL | 1 refills | Status: AC
  Filled 2024-02-07 – 2024-05-01 (×2): qty 45, 90d supply, fill #0

## 2024-02-07 NOTE — Patient Instructions (Signed)
 Please call to schedule your mammogram and/or bone density: [facility] at [facility]  Address: [address], [place], [place] [address] Phone: [phone]  [facility] at [facility] [address] [place],  [place]  [address] Phone: [phone]

## 2024-02-07 NOTE — Progress Notes (Signed)
 BP 135/72   Pulse 71   Temp 98 F (36.7 C) (Oral)   Ht 4' 11 (1.499 m)   Wt 227 lb 9.6 oz (103.2 kg)   LMP  (LMP Unknown)   SpO2 98%   BMI 45.97 kg/m    Subjective:    Patient ID: Tricia Hill, female    DOB: 12-Nov-1970, 53 y.o.   MRN: [id number]  HPI: Tricia Hill is a 53 y.o. female presenting on 02/07/2024 for comprehensive medical examination. Current medical complaints include:  DIABETES Hypoglycemic episodes:no Polydipsia/polyuria: no Visual disturbance: no Chest pain: no Paresthesias: no Glucose Monitoring: yes Taking Insulin ?: no Blood Pressure Monitoring: not checking Retinal Examination: Up to Date Foot Exam: Up to Date Diabetic Education: Completed Pneumovax: Given today Influenza: will get at work Aspirin: no  HYPERTENSION / HYPERLIPIDEMIA Satisfied with current treatment? yes Duration of hypertension: chronic BP monitoring frequency: not checking BP medication side effects: no Past BP meds: losartan  Duration of hyperlipidemia: chronic Cholesterol medication side effects: no Cholesterol supplements: none Past cholesterol medications: atorvastatin  Medication compliance: excellent compliance Aspirin: no Recent stressors: yes Recurrent headaches: no Visual changes: no Palpitations: no Dyspnea: no Chest pain: no Lower extremity edema: yes Dizzy/lightheaded: no  She currently lives with: husband and kids Menopausal Symptoms: no  Depression Screen done today and results listed below:     12/03/2023   10:29 AM 10/21/2023   10:09 AM 08/02/2023   10:48 AM 07/11/2023   10:07 AM 04/10/2023    8:57 AM  Depression screen PHQ 2/9  Decreased Interest 0 1 0 0 0  Down, Depressed, Hopeless 0 1 0 0 0  PHQ - 2 Score 0 2 0 0 0  Altered sleeping 1 0 0 1   Tired, decreased energy 1 1 1 1    Change in appetite 0 0 0 0   Feeling bad or failure about yourself  0 1 0 0   Trouble concentrating 0 0 0 0   Moving slowly or fidgety/restless 0 0 0 0    Suicidal thoughts 0 0 0 0   PHQ-9 Score 2 4 1 2    Difficult doing work/chores Not difficult at all Somewhat difficult  Not difficult at all    Past Medical History:  Past Medical History:  Diagnosis Date   Allergy 2012   Anxiety    Arthritis    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   Asthma    Diabetes mellitus without complication (HCC) 2016   GERD (gastroesophageal reflux disease)    Heart murmur    child   History of kidney stones    Hypertension    Neuromuscular disorder (HCC)    right hands tingling and numbness   PONV (postoperative nausea and vomiting)    Shingles    Sleep apnea    USE CPAP    Surgical History:  Past Surgical History:  Procedure Laterality Date   ANTERIOR CERVICAL DECOMP/DISCECTOMY FUSION N/A 04/13/2022   Procedure: Anterior Cervical Discectomy Fusion - Cervical seven-Thoracic one removal of plate;  Surgeon: Louis Shove, MD;  Location: [facility] OR;  Service: Neurosurgery;  Laterality: N/A;   Carpel Tunnel Surgery Right 10/01/2023   CESAREAN SECTION     X 2   CHOLECYSTECTOMY N/A 01/18/2017   Procedure: LAPAROSCOPIC CHOLECYSTECTOMY WITH INTRAOPERATIVE CHOLANGIOGRAM;  Surgeon: Dessa Reyes ORN, MD;  Location: [facility] ORS;  Service: General;  Laterality: N/A;   COLONOSCOPY WITH PROPOFOL  N/A 10/13/2020   Procedure: COLONOSCOPY WITH PROPOFOL ;  Surgeon: Unk Corinn Skiff, MD;  Location: [facility] ENDOSCOPY;  Service: Gastroenterology;  Laterality: N/A;  COVID POSITIVE 09/30/2020   CYSTOSCOPY/URETEROSCOPY/HOLMIUM LASER/STENT PLACEMENT Left 08/21/2022   Procedure: CYSTOSCOPY/URETEROSCOPY/HOLMIUM LASER/STENT PLACEMENT;  Surgeon: Twylla Glendia BROCKS, MD;  Location: [facility] ORS;  Service: Urology;  Laterality: Left;   DIAGNOSTIC LAPAROSCOPY  2006   EXCISION OF ABDOMINAL WALL ENDOMETRIOMA   EXTRACORPOREAL SHOCK WAVE LITHOTRIPSY Left 07/12/2022   Procedure: EXTRACORPOREAL SHOCK WAVE LITHOTRIPSY (ESWL);  Surgeon: Penne Knee, MD;  Location: [facility] ORS;  Service: Urology;  Laterality:  Left;   SPINAL FUSION  2012   C3 and C4   TOTAL ABDOMINAL HYSTERECTOMY     Partial    Medications:  Current Outpatient Medications on File Prior to Visit  Medication Sig   atorvastatin  (LIPITOR) 40 MG tablet Take 1 tablet (40 mg total) by mouth once a week.   dorzolamide -timolol  (COSOPT ) 2-0.5 % ophthalmic solution Place 1 drop into both eyes 4 (four) times daily.   glucose blood (FREESTYLE LITE) test strip Use as directed   glucose monitoring kit (FREESTYLE) monitoring kit 1 each by Does not apply route as needed for other.   ibuprofen  (ADVIL ) 600 MG tablet Take 1 tablet (600 mg total) by mouth every 8 (eight) hours as needed for moderate pain.   ketorolac  (ACULAR ) 0.5 % ophthalmic solution Place 1 drop into the right eye 4 (four) times daily.   Lancets (FREESTYLE) lancets Use as directed.   ondansetron  (ZOFRAN ) 4 MG tablet Take 1 tablet (4 mg total) by mouth every 8 (eight) hours as needed for nausea or vomiting.   prednisoLONE  acetate (PRED FORTE ) 1 % ophthalmic suspension Place 1 drop into the right eye every 2 (two) hours.   No current facility-administered medications on file prior to visit.    Allergies:  Allergies  Allergen Reactions   Lisinopril  Cough   Semaglutide  Nausea And Vomiting   Ultram [Tramadol Hcl] Nausea And Vomiting    Severe vomiting    Victoza  [Liraglutide ] Nausea And Vomiting    Social History:  Social History   Socioeconomic History   Marital status: Married    Spouse name: Bruce   Number of children: Not on file   Years of education: Not on file   Highest education level: Associate degree: occupational, Scientist, product/process development, or vocational program  Occupational History   Not on file  Tobacco Use   Smoking status: Never   Smokeless tobacco: Never  Vaping Use   Vaping status: Never Used  Substance and Sexual Activity   Alcohol use: Not Currently    Comment: on occasion   Drug use: No   Sexual activity: Not Currently    Birth control/protection:  Surgical, None  Other Topics Concern   Not on file  Social History Narrative   Not on file   Social Drivers of Health   Financial Resource Strain: Low Risk  (12/03/2023)   Overall Financial Resource Strain (CARDIA)    Difficulty of Paying Living Expenses: Not very hard  Food Insecurity: No Food Insecurity (12/03/2023)   Hunger Vital Sign    Worried About Running Out of Food in the Last Year: Never true    Ran Out of Food in the Last Year: Never true  Transportation Needs: No Transportation Needs (12/03/2023)   PRAPARE - Administrator, Civil Service (Medical): No    Lack of Transportation (Non-Medical): No  Physical Activity: Insufficiently Active (12/03/2023)   Exercise Vital Sign    Days of Exercise per Week: 1 day    Minutes of  Exercise per Session: 10 min  Stress: No Stress Concern Present (12/03/2023)   [employer] of Occupational Health - Occupational Stress Questionnaire    Feeling of Stress: Only a little  Social Connections: Socially Integrated (12/03/2023)   Social Connection and Isolation Panel    Frequency of Communication with Friends and Family: More than three times a week    Frequency of Social Gatherings with Friends and Family: Three times a week    Attends Religious Services: More than 4 times per year    Active Member of Clubs or Organizations: Yes    Attends Banker Meetings: More than 4 times per year    Marital Status: Married  Catering manager Violence: Not At Risk (02/07/2024)   Humiliation, Afraid, Rape, and Kick questionnaire    Fear of Current or Ex-Partner: No    Emotionally Abused: No    Physically Abused: No    Sexually Abused: No   Social History   Tobacco Use  Smoking Status Never  Smokeless Tobacco Never   Social History   Substance and Sexual Activity  Alcohol Use Not Currently   Comment: on occasion    Family History:  Family History  Problem Relation Age of Onset   Hypertension Mother    Diabetes  Mother    Hypertension Father    Graves' disease Sister    Alcohol abuse Sister    Clotting disorder Brother    Heart disease Maternal Grandmother    Heart disease Maternal Grandfather    Kidney disease Neg Hx    Breast cancer Neg Hx     Past medical history, surgical history, medications, allergies, family history and social history reviewed with patient today and changes made to appropriate areas of the chart.   Review of Systems  Constitutional:  Positive for malaise/fatigue. Negative for chills, diaphoresis, fever and weight loss.  HENT: Negative.    Eyes:  Positive for blurred vision and redness. Negative for double vision, photophobia, pain and discharge.  Respiratory: Negative.    Cardiovascular:  Positive for leg swelling. Negative for chest pain, palpitations, orthopnea, claudication and PND.  Gastrointestinal: Negative.   Genitourinary: Negative.   Musculoskeletal:  Positive for back pain. Negative for falls, joint pain, myalgias and neck pain.  Skin:  Positive for itching. Negative for rash.  Neurological:  Positive for tingling. Negative for tremors, sensory change, speech change, focal weakness, seizures, loss of consciousness, weakness and headaches.  Endo/Heme/Allergies: Negative.   Psychiatric/Behavioral: Negative.     All other ROS negative except what is listed above and in the HPI.      Objective:    BP 135/72   Pulse 71   Temp 98 F (36.7 C) (Oral)   Ht 4' 11 (1.499 m)   Wt 227 lb 9.6 oz (103.2 kg)   LMP  (LMP Unknown)   SpO2 98%   BMI 45.97 kg/m   Wt Readings from Last 3 Encounters:  02/07/24 227 lb 9.6 oz (103.2 kg)  12/03/23 224 lb (101.6 kg)  10/21/23 220 lb 12.8 oz (100.2 kg)    Physical Exam Vitals and nursing note reviewed.  Constitutional:      General: She is not in acute distress.    Appearance: Normal appearance. She is obese. She is not ill-appearing, toxic-appearing or diaphoretic.  HENT:     Head: Normocephalic and atraumatic.      Right Ear: Tympanic membrane, ear canal and external ear normal. There is no impacted cerumen.  Left Ear: Tympanic membrane, ear canal and external ear normal. There is no impacted cerumen.     Nose: Nose normal. No congestion or rhinorrhea.     Mouth/Throat:     Mouth: Mucous membranes are moist.     Pharynx: Oropharynx is clear. No oropharyngeal exudate or posterior oropharyngeal erythema.  Eyes:     General: No scleral icterus.       Right eye: No discharge.        Left eye: No discharge.     Extraocular Movements: Extraocular movements intact.     Conjunctiva/sclera: Conjunctivae normal.     Pupils: Pupils are equal, round, and reactive to light.  Neck:     Vascular: No carotid bruit.  Cardiovascular:     Rate and Rhythm: Normal rate and regular rhythm.     Pulses: Normal pulses.     Heart sounds: No murmur heard.    No friction rub. No gallop.  Pulmonary:     Effort: Pulmonary effort is normal. No respiratory distress.     Breath sounds: Normal breath sounds. No stridor. No wheezing, rhonchi or rales.  Chest:     Chest wall: No tenderness.  Abdominal:     General: Abdomen is flat. Bowel sounds are normal. There is no distension.     Palpations: Abdomen is soft. There is no mass.     Tenderness: There is no abdominal tenderness. There is no right CVA tenderness, left CVA tenderness, guarding or rebound.     Hernia: No hernia is present.  Genitourinary:    Comments: Breast and pelvic exams deferred with shared decision making Musculoskeletal:        General: No swelling, tenderness, deformity or signs of injury.     Cervical back: Normal range of motion and neck supple. No rigidity. No muscular tenderness.     Right lower leg: No edema.     Left lower leg: No edema.  Lymphadenopathy:     Cervical: No cervical adenopathy.  Skin:    General: Skin is warm and dry.     Capillary Refill: Capillary refill takes less than 2 seconds.     Coloration: Skin is not jaundiced or  pale.     Findings: No bruising, erythema, lesion or rash.  Neurological:     General: No focal deficit present.     Mental Status: She is alert and oriented to person, place, and time. Mental status is at baseline.     Cranial Nerves: No cranial nerve deficit.     Sensory: No sensory deficit.     Motor: No weakness.     Coordination: Coordination normal.     Gait: Gait normal.     Deep Tendon Reflexes: Reflexes normal.  Psychiatric:        Mood and Affect: Mood normal.        Behavior: Behavior normal.        Thought Content: Thought content normal.        Judgment: Judgment normal.     Results for orders placed or performed in visit on 02/07/24  Bayer DCA Hb A1c Waived   Collection Time: 02/07/24  1:34 PM  Result Value Ref Range   HB A1C (BAYER DCA - WAIVED) 6.6 (H) 4.8 - 5.6 %  Microalbumin, Urine Waived   Collection Time: 02/07/24  1:34 PM  Result Value Ref Range   Microalb, Ur Waived 10 0 - 19 mg/L   Creatinine, Urine Waived 100 10 - 300 mg/dL  Microalb/Creat Ratio <30 <30 mg/g  CBC with Differential/Platelet   Collection Time: 02/07/24  1:35 PM  Result Value Ref Range   WBC 9.0 3.4 - 10.8 x10E3/uL   RBC 4.66 3.77 - 5.28 x10E6/uL   Hemoglobin 13.3 11.1 - 15.9 g/dL   Hematocrit 58.4 65.9 - 46.6 %   MCV 89 79 - 97 fL   MCH 28.5 26.6 - 33.0 pg   MCHC 32.0 31.5 - 35.7 g/dL   RDW 86.6 88.2 - 84.5 %   Platelets 306 150 - 450 x10E3/uL   Neutrophils 58 Not Estab. %   Lymphs 32 Not Estab. %   Monocytes 6 Not Estab. %   Eos 3 Not Estab. %   Basos 1 Not Estab. %   Neutrophils Absolute 5.3 1.4 - 7.0 x10E3/uL   Lymphocytes Absolute 2.9 0.7 - 3.1 x10E3/uL   Monocytes Absolute 0.5 0.1 - 0.9 x10E3/uL   EOS (ABSOLUTE) 0.2 0.0 - 0.4 x10E3/uL   Basophils Absolute 0.1 0.0 - 0.2 x10E3/uL   Immature Granulocytes 0 Not Estab. %   Immature Grans (Abs) 0.0 0.0 - 0.1 x10E3/uL  Comprehensive metabolic panel with GFR   Collection Time: 02/07/24  1:35 PM  Result Value Ref Range    Glucose 101 (H) 70 - 99 mg/dL   BUN 13 6 - 24 mg/dL   Creatinine, Ser 9.11 0.57 - 1.00 mg/dL   eGFR 80 >40 fO/fpw/8.26   BUN/Creatinine Ratio 15 9 - 23   Sodium 140 134 - 144 mmol/L   Potassium 4.0 3.5 - 5.2 mmol/L   Chloride 101 96 - 106 mmol/L   CO2 23 20 - 29 mmol/L   Calcium  9.6 8.7 - 10.2 mg/dL   Total Protein 6.9 6.0 - 8.5 g/dL   Albumin 4.4 3.8 - 4.9 g/dL   Globulin, Total 2.5 1.5 - 4.5 g/dL   Bilirubin Total 0.4 0.0 - 1.2 mg/dL   Alkaline Phosphatase 141 (H) 44 - 121 IU/L   AST 22 0 - 40 IU/L   ALT 32 0 - 32 IU/L  Lipid Panel w/o Chol/HDL Ratio   Collection Time: 02/07/24  1:35 PM  Result Value Ref Range   Cholesterol, Total 164 100 - 199 mg/dL   Triglycerides 796 (H) 0 - 149 mg/dL   HDL 42 >60 mg/dL   VLDL Cholesterol Cal 34 5 - 40 mg/dL   LDL Chol Calc (NIH) 88 0 - 99 mg/dL  TSH   Collection Time: 02/07/24  1:35 PM  Result Value Ref Range   TSH 4.530 (H) 0.450 - 4.500 uIU/mL      Assessment & Plan:   Problem List Items Addressed This Visit       Cardiovascular and Mediastinum   HTN (hypertension)   Under good control on current regimen. Continue current regimen. Continue to monitor. Call with any concerns. Refills given. Labs drawn today.       Relevant Medications   losartan  (COZAAR ) 25 MG tablet   Other Relevant Orders   Microalbumin, Urine Waived (Completed)     Endocrine   Controlled diabetes mellitus type 2 with complications (HCC)   Doing great with A1c of 6.6 down from 6.7. Will continue current regimen. Continue to monitor. Call with any concerns.       Relevant Medications   losartan  (COZAAR ) 25 MG tablet   tirzepatide  (MOUNJARO ) 12.5 MG/0.5ML Pen   Other Relevant Orders   Bayer DCA Hb A1c Waived (Completed)   Microalbumin, Urine Waived (Completed)   Pneumococcal  conjugate vaccine 20-valent (Prevnar 20) (Completed)   Hyperlipidemia associated with type 2 diabetes mellitus (HCC)   Under good control on current regimen. Continue current  regimen. Continue to monitor. Call with any concerns. Refills given. Labs drawn today.        Relevant Medications   losartan  (COZAAR ) 25 MG tablet   tirzepatide  (MOUNJARO ) 12.5 MG/0.5ML Pen   Other Visit Diagnoses       Routine general medical examination at a health care facility    -  Primary   Vaccines updated. Screening labs checked today. Pap N/A. Mammo ordered. Colonoscopy up to date. Continue diet and exercise. Call with any concerns.   Relevant Orders   CBC with Differential/Platelet (Completed)   Comprehensive metabolic panel with GFR (Completed)   Lipid Panel w/o Chol/HDL Ratio (Completed)   TSH (Completed)   Bayer DCA Hb A1c Waived (Completed)   Microalbumin, Urine Waived (Completed)     Encounter for screening mammogram for malignant neoplasm of breast       Mammo ordered today.   Relevant Orders   MM 3D SCREENING MAMMOGRAM BILATERAL BREAST     Need for pneumococcal 20-valent conjugate vaccination       Prevnar given today.        Follow up plan: Return in about 3 months (around 05/09/2024).   LABORATORY TESTING:  - Pap smear: not applicable  IMMUNIZATIONS:   - Tdap: Tetanus vaccination status reviewed: last tetanus booster within 10 years. - Influenza: Postponed to flu season - Prevnar: Administered today - COVID: Refused - HPV: Not applicable - Shingrix  vaccine: Up to date  SCREENING: -Mammogram: Ordered today  - Colonoscopy: Up to date   PATIENT COUNSELING:   Advised to take 1 mg of folate supplement per day if capable of pregnancy.   Sexuality: Discussed sexually transmitted diseases, partner selection, use of condoms, avoidance of unintended pregnancy  and contraceptive alternatives.   Advised to avoid cigarette smoking.  I discussed with the patient that most people either abstain from alcohol or drink within safe limits (<=14/week and <=4 drinks/occasion for males, <=7/weeks and <= 3 drinks/occasion for females) and that the risk for alcohol  disorders and other health effects rises proportionally with the number of drinks per week and how often a drinker exceeds daily limits.  Discussed cessation/primary prevention of drug use and availability of treatment for abuse.   Diet: Encouraged to adjust caloric intake to maintain  or achieve ideal body weight, to reduce intake of dietary saturated fat and total fat, to limit sodium intake by avoiding high sodium foods and not adding table salt, and to maintain adequate dietary potassium and calcium  preferably from fresh fruits, vegetables, and low-fat dairy products.    stressed the importance of regular exercise  Injury prevention: Discussed safety belts, safety helmets, smoke detector, smoking near bedding or upholstery.   Dental health: Discussed importance of regular tooth brushing, flossing, and dental visits.    NEXT PREVENTATIVE PHYSICAL DUE IN 1 YEAR. Return in about 3 months (around 05/09/2024).

## 2024-02-08 LAB — COMPREHENSIVE METABOLIC PANEL WITH GFR
ALT: 32 IU/L (ref 0–32)
AST: 22 IU/L (ref 0–40)
Albumin: 4.4 g/dL (ref 3.8–4.9)
Alkaline Phosphatase: 141 IU/L — ABNORMAL HIGH (ref 44–121)
BUN/Creatinine Ratio: 15 (ref 9–23)
BUN: 13 mg/dL (ref 6–24)
Bilirubin Total: 0.4 mg/dL (ref 0.0–1.2)
CO2: 23 mmol/L (ref 20–29)
Calcium: 9.6 mg/dL (ref 8.7–10.2)
Chloride: 101 mmol/L (ref 96–106)
Creatinine, Ser: 0.88 mg/dL (ref 0.57–1.00)
Globulin, Total: 2.5 g/dL (ref 1.5–4.5)
Glucose: 101 mg/dL — ABNORMAL HIGH (ref 70–99)
Potassium: 4 mmol/L (ref 3.5–5.2)
Sodium: 140 mmol/L (ref 134–144)
Total Protein: 6.9 g/dL (ref 6.0–8.5)
eGFR: 80 mL/min/1.73 (ref >59)

## 2024-02-08 LAB — CBC WITH DIFFERENTIAL/PLATELET
Basophils Absolute: 0.1 x10E3/uL (ref 0.0–0.2)
Basos: 1 %
EOS (ABSOLUTE): 0.2 x10E3/uL (ref 0.0–0.4)
Eos: 3 %
Hematocrit: 41.5 % (ref 34.0–46.6)
Hemoglobin: 13.3 g/dL (ref 11.1–15.9)
Immature Grans (Abs): 0 x10E3/uL (ref 0.0–0.1)
Immature Granulocytes: 0 %
Lymphocytes Absolute: 2.9 x10E3/uL (ref 0.7–3.1)
Lymphs: 32 %
MCH: 28.5 pg (ref 26.6–33.0)
MCHC: 32 g/dL (ref 31.5–35.7)
MCV: 89 fL (ref 79–97)
Monocytes Absolute: 0.5 x10E3/uL (ref 0.1–0.9)
Monocytes: 6 %
Neutrophils Absolute: 5.3 x10E3/uL (ref 1.4–7.0)
Neutrophils: 58 %
Platelets: 306 x10E3/uL (ref 150–450)
RBC: 4.66 x10E6/uL (ref 3.77–5.28)
RDW: 13.3 % (ref 11.7–15.4)
WBC: 9 x10E3/uL (ref 3.4–10.8)

## 2024-02-08 LAB — LIPID PANEL W/O CHOL/HDL RATIO
Cholesterol, Total: 164 mg/dL (ref 100–199)
HDL: 42 mg/dL (ref >39)
LDL Chol Calc (NIH): 88 mg/dL (ref 0–99)
Triglycerides: 203 mg/dL — ABNORMAL HIGH (ref 0–149)
VLDL Cholesterol Cal: 34 mg/dL (ref 5–40)

## 2024-02-08 LAB — TSH: TSH: 4.53 u[IU]/mL — ABNORMAL HIGH (ref 0.450–4.500)

## 2024-02-11 ENCOUNTER — Ambulatory Visit: Attending: Neurosurgery | Admitting: Occupational Therapy

## 2024-02-11 ENCOUNTER — Ambulatory Visit: Payer: Self-pay | Admitting: Family Medicine

## 2024-02-11 DIAGNOSIS — M6281 Muscle weakness (generalized): Secondary | ICD-10-CM | POA: Insufficient documentation

## 2024-02-11 DIAGNOSIS — R208 Other disturbances of skin sensation: Secondary | ICD-10-CM | POA: Diagnosis not present

## 2024-02-11 DIAGNOSIS — M5416 Radiculopathy, lumbar region: Secondary | ICD-10-CM | POA: Diagnosis not present

## 2024-02-11 DIAGNOSIS — M25631 Stiffness of right wrist, not elsewhere classified: Secondary | ICD-10-CM | POA: Diagnosis not present

## 2024-02-11 DIAGNOSIS — L905 Scar conditions and fibrosis of skin: Secondary | ICD-10-CM | POA: Insufficient documentation

## 2024-02-11 DIAGNOSIS — R2689 Other abnormalities of gait and mobility: Secondary | ICD-10-CM | POA: Diagnosis not present

## 2024-02-11 NOTE — Assessment & Plan Note (Signed)
 Under good control on current regimen. Continue current regimen. Continue to monitor. Call with any concerns. Refills given. Labs drawn today.

## 2024-02-11 NOTE — Assessment & Plan Note (Signed)
 Doing great with A1c of 6.6 down from 6.7. Will continue current regimen. Continue to monitor. Call with any concerns.

## 2024-02-11 NOTE — Therapy (Signed)
 OUTPATIENT OCCUPATIONAL THERAPY ORTHO TREATMENT  Patient Name: Tricia Hill MRN: [id number] DOB:[birth year], 53 y.o., female Today's Date: 02/11/2024  PCP: Dr Vicci MART PROVIDER: Dr Louis  END OF SESSION:  OT End of Session - 02/11/24 0821     Visit Number 4    Number of Visits 12    Date for OT Re-Evaluation 03/24/24    OT Start Time 0820    OT Stop Time 0905    OT Time Calculation (min) 45 min    Activity Tolerance Patient tolerated treatment well    Behavior During Therapy [facility] for tasks assessed/performed          Past Medical History:  Diagnosis Date   Allergy 2012   Anxiety    Arthritis    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   Asthma    Diabetes mellitus without complication (HCC) 2016   GERD (gastroesophageal reflux disease)    Heart murmur    child   History of kidney stones    Hypertension    Neuromuscular disorder (HCC)    right hands tingling and numbness   PONV (postoperative nausea and vomiting)    Shingles    Sleep apnea    USE CPAP   Past Surgical History:  Procedure Laterality Date   ANTERIOR CERVICAL DECOMP/DISCECTOMY FUSION N/A 04/13/2022   Procedure: Anterior Cervical Discectomy Fusion - Cervical seven-Thoracic one removal of plate;  Surgeon: Louis Shove, MD;  Location: [facility] OR;  Service: Neurosurgery;  Laterality: N/A;   Carpel Tunnel Surgery Right 10/01/2023   CESAREAN SECTION     X 2   CHOLECYSTECTOMY N/A 01/18/2017   Procedure: LAPAROSCOPIC CHOLECYSTECTOMY WITH INTRAOPERATIVE CHOLANGIOGRAM;  Surgeon: Dessa Reyes ORN, MD;  Location: [facility] ORS;  Service: General;  Laterality: N/A;   COLONOSCOPY WITH PROPOFOL  N/A 10/13/2020   Procedure: COLONOSCOPY WITH PROPOFOL ;  Surgeon: Unk Corinn Skiff, MD;  Location: [facility] ENDOSCOPY;  Service: Gastroenterology;  Laterality: N/A;  COVID POSITIVE 09/30/2020   CYSTOSCOPY/URETEROSCOPY/HOLMIUM LASER/STENT PLACEMENT Left 08/21/2022   Procedure: CYSTOSCOPY/URETEROSCOPY/HOLMIUM LASER/STENT PLACEMENT;   Surgeon: Twylla Glendia BROCKS, MD;  Location: [facility] ORS;  Service: Urology;  Laterality: Left;   DIAGNOSTIC LAPAROSCOPY  2006   EXCISION OF ABDOMINAL WALL ENDOMETRIOMA   EXTRACORPOREAL SHOCK WAVE LITHOTRIPSY Left 07/12/2022   Procedure: EXTRACORPOREAL SHOCK WAVE LITHOTRIPSY (ESWL);  Surgeon: Penne Knee, MD;  Location: [facility] ORS;  Service: Urology;  Laterality: Left;   SPINAL FUSION  2012   C3 and C4   TOTAL ABDOMINAL HYSTERECTOMY     Partial   Patient Active Problem List   Diagnosis Date Noted   Chronic anterior uveitis of left eye 02/05/2024   Chronic low back pain 12/03/2023   Disorder of right median nerve 12/03/2023   Lumbar radiculopathy 12/03/2023   Numbness of hand 12/03/2023   History of carpal tunnel surgery of right wrist 10/01/2023   Uveitis 07/11/2023   Numbness of upper limb 05/28/2023   Pain in right foot 05/28/2023   Cervical spondylosis with myelopathy and radiculopathy 04/13/2022   Osteoarthritis of spine with radiculopathy, cervical region 02/08/2022   Osteoarthritis of spine with radiculopathy, thoracic region 02/08/2022   Anxiety 09/05/2021   HTN (hypertension) 04/16/2018   Fatty liver 12/07/2016   Microscopic hematuria 05/26/2015   OSA (obstructive sleep apnea) 01/31/2015   S/P hysterectomy 01/31/2015   Controlled diabetes mellitus type 2 with complications (HCC) 01/31/2015   Morbid obesity (HCC) 01/31/2015   Hyperlipidemia associated with type 2 diabetes mellitus (HCC) 01/31/2015    ONSET DATE: 10/01/23  REFERRING DIAG: R CTR  THERAPY DIAG:  Scar tissue  Other disturbances of skin sensation  Muscle weakness (generalized)  Stiffness of right wrist, not elsewhere classified  Rationale for Evaluation and Treatment: Rehabilitation  SUBJECTIVE:   SUBJECTIVE STATEMENT: Really like doing the putty.  Done the weight.  Getting easy.  I did have some pain on my outside upper arm this weekend.  And then here and there like some shooting pain or achiness  in my forearm and pinky  PERTINENT HISTORY: DR Louis note: The patient returns today in follow-up. She continues to have some right him discomfort as she recover some carpal tunnel surgery. She has not started her physical therapy. She also notes persistent posterior cervical pain with some radiation. She is having no definite weakness. Her lumbar pain is stable. She did not get much improvement following recent epidural steroid injection   PRECAUTIONS: None    WEIGHT BEARING RESTRICTIONS: No  PAIN:  Are you having pain? 0/10   FALLS: Has patient fallen in last 6 months? 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  But not at the moment on medical leave.  Does house stuff around with cooking and cleaning and laundry.  On her phone.  Reads some.  Has 2 grownup kids and a 35 -year-old-does a little flowers  PATIENT GOALS: I just want the numbness better and the pain but then also more strength so I can do my job as a Engineer, civil (consulting)  NEXT MD VISIT: 21 August  OBJECTIVE:  Note: Objective measures were completed at Evaluation unless otherwise noted.  HAND DOMINANCE: Right  ADLs: Difficulty with cutting food, cooking doing laundry.  Putting on jewelry, do buttons, opening jars or packages or Ziploc bags: Cannot grip or hold things I drop things  FUNCTIONAL OUTCOME MEASURES: To be done excision  UPPER EXTREMITY ROM:     Active ROM Right eval Left eval  Shoulder flexion    Shoulder abduction    Shoulder adduction    Shoulder extension    Shoulder internal rotation    Shoulder external rotation    Elbow flexion    Elbow extension    Wrist flexion 70   Wrist extension 64   Wrist ulnar deviation    Wrist radial deviation    Wrist pronation    Wrist supination    (Blank rows = not tested)  Active ROM Right eval Left eval  Thumb MCP (0-60)    Thumb IP (0-80)    Thumb Radial abd/add (0-55)     Thumb Palmar  abd/add (0-45)     Thumb Opposition to Small Finger     Index MCP (0-90)     Index PIP (0-100)     Index DIP (0-70)      Long MCP (0-90)      Long PIP (0-100)      Long DIP (0-70)      Ring MCP (0-90)      Ring PIP (0-100)      Ring DIP (0-70)      Little MCP (0-90)      Little PIP (0-100)      Little DIP (0-70)      Digit flexion within normal limits but tight when attempting to make a fist. Opposition within normal limits. Thumb palmar radial abduction within normal limits  HAND FUNCTION: Grip strength: Right: 27 lbs; Left: 50 lbs, Lateral pinch: Right: 4  lbs, Left: 12 lbs, and 3 point pinch: Right: 6 lbs, Left: 14 lbs 02/06/24: Grip strength: Right: 29 lbs; Left: 50 lbs, Lateral pinch: Right: 4 lbs, Left: 12 lbs, and 3 point pinch: Right: 6 lbs, Left: 14 lbs  02/11/24: Grip strength: Right: 42 lbs; Left: 50 lbs, Lateral pinch: Right: 5 lbs, Left: 12 lbs, and 3 point pinch: Right: 7 lbs, Left: 14 lbs  COORDINATION: Decrease in limited because of numbness in digits at times  SENSATION: Patient report increased numbness from thumb through fourth digit on and off during the day and night  EDEMA: Not noticeable.  COGNITION: Overall cognitive status: Within functional limits for tasks assessed      TREATMENT DATE: 02/11/24                                                                                                                             Patient arrived with great grip strength.  As well as increased prehension strength.  Patient able to do 1 pound weight easy as well as putty. Increase putty to medium teal putty but decreased to 1 set of 12-15 twice a day Increase to 2 pounds for forearm also 1 set 2 times a day   Modalities: Paraffin:  Time: 8 Location right hand, wrist and forearm Decrease stiffness, increase motion prior to manual therapy and therapist   Soft tissue mobilization done with Graston tool #2 over right volar forearm as well as palm.  Increased  trigger points and tenderness over volar forearm. Tolerated static dry cupping 5 cm size mid and proximal fifth volar forearm 3 times a day combination with wrist flexion extension 15 reps each time with great results; as well as 2 repetitions of dry static On palm and forearm in combination with wrist extension and digit extension 10 reps repeated 2 times. Tolerated static light dry cupping at 5 cm size placed on volar forearm in combination with wrist flexion/extension. Done some 3 static 5 cm medium pressure cupping on lateral deltoid x 2 with shoulder abduction.  To decrease tightness and trigger points.   Reviewed with patient scar mobilization again.  Ongoing Cica -Care scar pad use at nighttime. Scar mobilization done with gentle brushing strokes of Graston tool #2.  Tolerated mini massager as well as MC and Carpal spreads x12 reps.  Reports family assisting with scar massage at home and foam roller on palm and volar forearm prior to range of motion - reviewed technique.  Recommend for patient to wear her wrist splint at nighttime Reviewed HEP: tendon glides 12 reps Continue rubber band for resistance for palmar radial abduction 12 reps tolerated well 2-3 sets of 12 Wrist flexion extension active assisted range of motion 12 reps  Upgrade to medium teal putty   -12 to 15 reps each: gripping ,  key pinch, rolling forearm, and 3 pt pinch. - can roll inbetween if feel some tightness or spasm   - Patient can increase in  3 days if pain-free to a second set and then to a third set in 6 days if pain-free Increase to 2 pound weight for wrist and forearm in all planes 12 reps 1 set 2 times a day Same advancement with in 3 days pain-free second set and then 6 days pain-free third set  Plan to complete HEP 2-3 times a day after contrast bath Patient to make a list of things that brings on numbness during the day.       PATIENT EDUCATION: Education details: findings of eval and HEP  Person  educated: Patient Education method: Explanation, Demonstration, Tactile cues, Verbal cues, and Handouts Education comprehension: verbalized understanding, returned demonstration, verbal cues required, and needs further education   GOALS: Goals reviewed with patient? Yes LONG TERM GOALS: Target date: 8 weeks  Patient to be independent in home program to decrease scar tissue and tenderness to report decrease stiffness tightness and digit range of motion as well as individually tendon glides Baseline: Increased tightness and stiffness in digit flexion.  Scar thick and adhere as well as tender and pain when hitting no knowledge of scar mobilization and massage Goal status: INITIAL  2.  Wrist active range of motion improved to within normal limits symptom-free today able to push and pull heavy door. Baseline: Patient report pain and tenderness over carpal tunnel scar with pressure or hitting it as well as decreased wrist flexion extension unable to push or pull with hand has increased symptoms in the wrist and forearm Goal status: INITIAL  3.  Grip and prehension strength in the right improved to within normal range for her age for patient to be able to turn doorknob, cut with a knife, open Ziploc bags and do buttons symptom-free Baseline: Right grip 27 pounds left 50 pounds, lateral pinch right 4 pounds left 12 pounds and 3 point pinch right 6 pounds left 14 pounds Goal status: INITIAL  4.  Patient function on PRWHE improved with more than 10 points in ADLs and IADLs Baseline:  PRWHE score to be done next session Goal status: INITIAL   ASSESSMENT:  CLINICAL IMPRESSION: Continues to wear brace at nighttime, reports mild numbness to R 1-3 digits and pain with driving. Patient present at OT evaluation with increased scar tissue with increased tenderness and pain in palm to volar forearm with use. Patient with increased stiffness tightness with digit flexion as well as decreased wrist flexion  extension.  Patient made great progress in grip strength since last seen.  Progressing slow but steady and 3 point on lateral pinch.  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  This session patient responded well to paraffin bath and theraputty with no pain.  Was able to upgrade patient also to 2 pound weight for wrist and forearm tolerating well.  Increase motion and decrease pain and numbness reported - Tolerated manual therapy with increased digit and wrist extension. Patient can benefit from skilled OT services to decrease scar tissue, pain and stiffness and increased motion and strength to return to prior level of function.  Patient did had a MRI to her cervical spine and is meeting with Dr. Louis this Thursday to review results.  PERFORMANCE DEFICITS: in functional skills including ADLs, IADLs, ROM, strength, pain, flexibility, decreased knowledge of use of DME, and UE functional use,   and psychosocial skills including environmental adaptation and routines and behaviors.   IMPAIRMENTS: are limiting  patient from ADLs, IADLs, rest and sleep, play, leisure, and social participation.   COMORBIDITIES: has no other co-morbidities that affects occupational performance. Patient will benefit from skilled OT to address above impairments and improve overall function.  MODIFICATION OR ASSISTANCE TO COMPLETE EVALUATION: No modification of tasks or assist necessary to complete an evaluation.  OT OCCUPATIONAL PROFILE AND HISTORY: Problem focused assessment: Including review of records relating to presenting problem.  CLINICAL DECISION MAKING: LOW - limited treatment options, no task modification necessary  REHAB POTENTIAL: Good for goals  EVALUATION COMPLEXITY: Low      PLAN:  OT FREQUENCY: 1-2x/week  OT DURATION: 6 weeks  PLANNED INTERVENTIONS: 97168 OT Re-evaluation, 97535 self care/ADL training, 02889  therapeutic exercise, 97530 therapeutic activity, 97112 neuromuscular re-education, 97140 manual therapy, 97035 ultrasound, 97018 paraffin, 02960 fluidotherapy, 97034 contrast bath, scar mobilization, passive range of motion, patient/family education, and DME and/or AE instructions    CONSULTED AND AGREED WITH PLAN OF CARE: Patient    Ancel Peters, OTR/L,CLT 02/11/2024, 10:28 AM

## 2024-02-12 ENCOUNTER — Encounter: Payer: Self-pay | Admitting: Physical Therapy

## 2024-02-12 ENCOUNTER — Ambulatory Visit: Admitting: Physical Therapy

## 2024-02-12 DIAGNOSIS — L905 Scar conditions and fibrosis of skin: Secondary | ICD-10-CM | POA: Diagnosis not present

## 2024-02-12 DIAGNOSIS — M6281 Muscle weakness (generalized): Secondary | ICD-10-CM | POA: Diagnosis not present

## 2024-02-12 DIAGNOSIS — R2689 Other abnormalities of gait and mobility: Secondary | ICD-10-CM

## 2024-02-12 DIAGNOSIS — R208 Other disturbances of skin sensation: Secondary | ICD-10-CM | POA: Diagnosis not present

## 2024-02-12 DIAGNOSIS — M25631 Stiffness of right wrist, not elsewhere classified: Secondary | ICD-10-CM | POA: Diagnosis not present

## 2024-02-12 DIAGNOSIS — M5416 Radiculopathy, lumbar region: Secondary | ICD-10-CM

## 2024-02-12 NOTE — Therapy (Signed)
 OUTPATIENT PHYSICAL THERAPY  EVALUATION   Patient Name: EDNA REDE MRN: [id number] DOB:1971/05/26, 53 y.o., female Today's Date: 02/12/2024  END OF SESSION:  PT End of Session - 02/12/24 1617     Visit Number 1    Number of Visits 17    Date for PT Re-Evaluation 05/06/24    Authorization Type [place] [employer] PPO reporting from 02/12/2024    Progress Note Due on Visit 10    PT Start Time 0902    PT Stop Time 0955    PT Time Calculation (min) 53 min    Activity Tolerance Patient limited by pain    Behavior During Therapy [facility] for tasks assessed/performed          Past Medical History:  Diagnosis Date   Allergy 2012   Anxiety    Arthritis    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   Asthma    Diabetes mellitus without complication (HCC) 2016   GERD (gastroesophageal reflux disease)    Heart murmur    child   History of kidney stones    Hypertension    Neuromuscular disorder (HCC)    right hands tingling and numbness   PONV (postoperative nausea and vomiting)    Shingles    Sleep apnea    USE CPAP   Past Surgical History:  Procedure Laterality Date   ANTERIOR CERVICAL DECOMP/DISCECTOMY FUSION N/A 04/13/2022   Procedure: Anterior Cervical Discectomy Fusion - Cervical seven-Thoracic one removal of plate;  Surgeon: Louis Shove, MD;  Location: [facility] OR;  Service: Neurosurgery;  Laterality: N/A;   Carpel Tunnel Surgery Right 10/01/2023   CESAREAN SECTION     X 2   CHOLECYSTECTOMY N/A 01/18/2017   Procedure: LAPAROSCOPIC CHOLECYSTECTOMY WITH INTRAOPERATIVE CHOLANGIOGRAM;  Surgeon: Dessa Reyes ORN, MD;  Location: [facility] ORS;  Service: General;  Laterality: N/A;   COLONOSCOPY WITH PROPOFOL  N/A 10/13/2020   Procedure: COLONOSCOPY WITH PROPOFOL ;  Surgeon: Unk Corinn Skiff, MD;  Location: [facility] ENDOSCOPY;  Service: Gastroenterology;  Laterality: N/A;  COVID POSITIVE 09/30/2020   CYSTOSCOPY/URETEROSCOPY/HOLMIUM LASER/STENT PLACEMENT Left 08/21/2022   Procedure:  CYSTOSCOPY/URETEROSCOPY/HOLMIUM LASER/STENT PLACEMENT;  Surgeon: Twylla Glendia BROCKS, MD;  Location: [facility] ORS;  Service: Urology;  Laterality: Left;   DIAGNOSTIC LAPAROSCOPY  2006   EXCISION OF ABDOMINAL WALL ENDOMETRIOMA   EXTRACORPOREAL SHOCK WAVE LITHOTRIPSY Left 07/12/2022   Procedure: EXTRACORPOREAL SHOCK WAVE LITHOTRIPSY (ESWL);  Surgeon: Penne Knee, MD;  Location: [facility] ORS;  Service: Urology;  Laterality: Left;   SPINAL FUSION  2012   C3 and C4   TOTAL ABDOMINAL HYSTERECTOMY     Partial   Patient Active Problem List   Diagnosis Date Noted   Chronic anterior uveitis of left eye 02/05/2024   Chronic low back pain 12/03/2023   Disorder of right median nerve 12/03/2023   Lumbar radiculopathy 12/03/2023   Numbness of hand 12/03/2023   History of carpal tunnel surgery of right wrist 10/01/2023   Uveitis 07/11/2023   Numbness of upper limb 05/28/2023   Pain in right foot 05/28/2023   Cervical spondylosis with myelopathy and radiculopathy 04/13/2022   Osteoarthritis of spine with radiculopathy, cervical region 02/08/2022   Osteoarthritis of spine with radiculopathy, thoracic region 02/08/2022   Anxiety 09/05/2021   HTN (hypertension) 04/16/2018   Fatty liver 12/07/2016   Microscopic hematuria 05/26/2015   OSA (obstructive sleep apnea) 01/31/2015   S/P hysterectomy 01/31/2015   Controlled diabetes mellitus type 2 with complications (HCC) 01/31/2015   Morbid obesity (HCC) 01/31/2015   Hyperlipidemia associated  with type 2 diabetes mellitus (HCC) 01/31/2015    PCP: Vicci Duwaine SQUIBB, DO  REFERRING PROVIDER: Louis Shove, MD (neurosurgery)  REFERRING DIAG: lumbar radiculopathy  Rationale for Evaluation and Treatment: Rehabilitation  THERAPY DIAG:  Radiculopathy, lumbar region  Muscle weakness (generalized)  Other abnormalities of gait and mobility  ONSET DATE: for > 5 years but legs worse over the last few months, especially since carpal tunnel surgery  SUBJECTIVE:                                                                                                                                                                                            SUBJECTIVE STATEMENT: Patient is here   PERTINENT HISTORY:  Patient is a 53 y.o. female who presents to outpatient physical therapy with a referral for medical diagnosis lumbar radiculopathy. This patient's chief complaints consist of low back and leg pain and leg weakness leading to the following functional deficits: difficulty with usual activities such as sitting, walking, bed mobility, transfers, bending, dressing, lifting, personal care, traveling, social life, working as a Engineer, civil (consulting), homemaking, sleeping, and leisure. Relevant past medical history and comorbidities include the following: she has OSA (obstructive sleep apnea); S/P hysterectomy; Controlled diabetes mellitus type 2 with complications (HCC); Morbid obesity (HCC); Hyperlipidemia associated with type 2 diabetes mellitus (HCC); Microscopic hematuria; Fatty liver; HTN (hypertension); Anxiety; Osteoarthritis of spine with radiculopathy, cervical region; Osteoarthritis of spine with radiculopathy, thoracic region; Cervical spondylosis with myelopathy and radiculopathy; Uveitis; Chronic low back pain; Disorder of right median nerve; History of carpal tunnel surgery of right wrist; Lumbar radiculopathy; Numbness of hand; Numbness of upper limb; Pain in right foot; and Chronic anterior uveitis of left eye on their problem list. she  has a past medical history of Allergy (2012), Anxiety, Arthritis, Asthma, Diabetes mellitus without complication (HCC) (2016), GERD (gastroesophageal reflux disease), Heart murmur, History of kidney stones, Hypertension, Neuromuscular disorder (HCC), PONV (postoperative nausea and vomiting), Shingles, and Sleep apnea. she  has a past surgical history that includes Cesarean section; Spinal fusion (2012); Total abdominal hysterectomy; Diagnostic  laparoscopy (2006); Cholecystectomy (N/A, 01/18/2017); Colonoscopy with propofol  (N/A, 10/13/2020); Anterior cervical decomp/discectomy fusion (N/A, 04/13/2022); Extracorporeal shock wave lithotripsy (Left, 07/12/2022); Cystoscopy/ureteroscopy/holmium laser/stent placement (Left, 08/21/2022); and Carpel Tunnel Surgery (Right, 10/01/2023). Patient denies hx of cancer, stroke, seizures, lung problems, and unexplained stumbling or dropping things. Drops things. Has had 2 neck surgeries but no low back surgeries.   She states she has urinary incontinence and this has worsened over time. She is always changing her underwear or wearing a pad. Usually it is when coughing and sneezing. Also has urgency and it  can start leaking before she gets to the bathroom. It is harder to hold the closer she gets to the toilet.   Exercise history: joined the gym in hopes to lose weight but cannot go because of low back pian. She wanted to do weight lifting.   She is terrified because her mom has similar back problems and cannot do anything now and has sort of given up. Her sister is also going through the same thing with her back. Patient's kids are getting married and she wants to be active and do things with her grandchildren.   What are your expectations for today? Get to know each other and the PT would ask questions about where hurting.   She has had 2 neck surgeries, so she feels like her spine doesn't like her. She had one in 2012 and one in 2023. Since the last neck surgery she has had issues with her right hand, arm, and lower back. She gets numbness and tingling in bilateral hands. A cervical spine MRI showed an area above C3 that the neurosurgeon is concerned about so she is getting a cervical CT scan now. The spot of concern is just above her last surgical site. She had R carpal tunnel release on the right side 09/2023 and is in OT for that. Lumbar MRI showed some bulging discs at L4 and L5. She feels like it is  something different. She works 12 hours shifts so she is used to her back aching at the end of shifts. This hurts all the time. It has been affecting her legs for the last few weeks. She had an injection in her back and it helped slightly (mostly with pain when moving in bed). She has also had problems with her eyes, that have affected her vision. She feels like her body has started falling apart since turing 51.   What is presently your main problem? Low back pain and leg pain/weakness How long has this episode been going on? It has been hurting for a long time, but it has really started to act up in April or May after her carpal tunnel release. It has bothered her for at least 5 years but not this bad.   Onset? gradual When did you first notice it? Just walking and by end of day she could hardly stand. Got worse after carpal tunnel surgery. She stopped working at that time and has not yet returned to work  Have you had this before? For over 5 years Activities that caused episodes? Working 12 hour shifts? unsure Location? Across top of pelvis (was more on R but now more L). This is the starting area. It would ache and get better. Now it is constantly there. Pain on top of her right foot (took her to the ER). This can be when she has pain down the leg or separate from that.   She feels the pain down her legs when she is sitting on the couch with her feet up. Getting up and down from a sitting position hurts a lot. She has to stand while holding something for a while before she can walk. It is better when she is up walking around for a while. The pain in her legs comes and goes, is down the back of both thighs and then felt at the top of both feet. R has been worse lately.   This morning is pain in the left leg to the posterior knee. Yesterday when she got out of the  care she felt a zap in her leg and almost fell like her foot was going to buckle.   She had an issue in the top of both feet have been  hurting for over a year. She ended up at the ED for the R (which is worse) sometime last year. There was no injury to her foot and xrays at the time were negative. Back pain onset was insidious.    Where do you feel most symptoms? Across entire low back Anything other than pain (weakness or paresthesias)? Intermittent weakness in both legs, not noticed paresthesia.  How would you describe the pain? Dull, occasionally sharp like a zap behind left knee.  Describe your symptoms on a 24 hours clock: unsure if pain moving wakes her up or if staying one position wakes her up. She has awoken and felt like she could not move and guesses that moving makes it worse. She had to grab her head board to shift her trunk. When she opens her eyes in the morning, no pain. When she goes to move it is painful.    what time of day is the worst for you? Mornings and evenings are worse. The best? Midday is less bad Aggravating Factors: What Movements, Activities, Positions, Situations (MAPS) make you feel worse or bring on your symptoms? Getting up from sitting, prolonged standing ( ), prolonged walking (10 min), sitting (legs hurt), standing legs and back hurt.  Tolerance: "How long can you stand/walk before symptoms get worse?" 10 minutes, can sometimes push herself 10 more minutes.  Reactivity/Irritability- How long does a flare up last? 30 minutes to ease off  Special Questions:  . episodes of extreme dizziness, fainting or blackouts? no  . extreme apprehension of flexing the head forward or unable to lie down flat? no  . bilateral numbness in the hands and/or feet with movement of the head? Has bilateral numbness in the hands (sometimes one or both) but not necessarily connected to neck movement.   PAIN: Are you having pain? Yes NPRS: Current: 6/10,  Best: 0/10, Worst: 10/10. Pain location: Across top of pelvis (was more on R but now more L). This is the starting area. It would ache and get better. Now it  is constantly there. She now has intermittent pain down the back of both thighs to the tops of both feet which can happen together or be one side or the other.  Pain description: Dull, occasionally sharp like a zap behind left knee Aggravating factors: Getting up from sitting, prolonged standing ( ), prolonged walking (10 min), sitting (legs hurt), standing legs and back hurt, sitting with legs propped up in front of her makes her legs hurt more, bending.  Relieving factors: injection partially, sitting can help her back (but makes her legs hurt)  FUNCTIONAL LIMITATIONS: cleaning, gardening, moving stuff, pulling trash cans to curb, mow the yard.   LEISURE: read, go out with friends, bible study group, going to church   PRECAUTIONS: No lifting of 10# or more due to R hand (recommended by OT so it doesn't damage her neck and arm until she gets more strength in her arm).   WEIGHT BEARING RESTRICTIONS: No  FALLS:  Has patient fallen in last 6 months? 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 Has been out April (with carpal tunnel surgery).   PATIENT GOALS:  I just want the pain to be more bearable. Be able to do some of the  things I used to do.   OBJECTIVE  DIAGNOSTIC FINDINGS:  Note from Dr. Malcolm on 10/31/2023 states Lumbar MRI scan shows evidence of early multilevel disc degeneration with disc bulging at L2-3 L3-4 and L4-5 without evidence of significant disc herniation or stenosis nor is there any evidence instability  Lumbar spine CT report from 08/21/2023:  CLINICAL DATA:  Lumbar radiculopathy with increased fracture risk.   EXAM: CT LUMBAR SPINE WITHOUT CONTRAST   TECHNIQUE: Multidetector CT imaging of the lumbar spine was performed without intravenous contrast administration. Multiplanar CT image reconstructions were also generated.   RADIATION DOSE REDUCTION: This exam was performed according to the departmental  dose-optimization program which includes automated exposure control, adjustment of the mA and/or kV according to patient size and/or use of iterative reconstruction technique.   COMPARISON:  Lumbar radiography 02/04/2022   FINDINGS: Segmentation: 5 lumbar type vertebrae.   Alignment: Normal.   Vertebrae: No acute fracture or focal pathologic process. Limbus vertebra at the anterior superior corner of L4. Sclerosis at the anterior superior corner of T12.   Paraspinal and other soft tissues: No perispinal mass or inflammation.   Disc levels:   T12- L1: Unremarkable.   L1-L2: Disc narrowing with gas containing fissure and disc bulging. Small facet spurs.   L2-L3: Disc narrowing with annular calcification and minor bulge. No neural impingement   L3-L4: Disc narrowing with gas containing fissure also extending into a upward pointing central disc herniation that is small. No neural compression.   L4-L5: Mild annulus bulging.  Negative facets.   L5-S1:Mild disc bulging. Moderate degenerative facet spurring. Suspect a synovial cyst projecting anterior to the left facet which would impact the left L5 nerve root.   IMPRESSION: 1. No acute finding. 2. Lumbar spine degeneration as described. Possible anterior synovial cyst at the degenerated left L5-S1 facet, which would impinge on the left L5 foraminal nerve root.  SELF-REPORTED FUNCTION Modified Oswestry Disability Index (mODI): 56% (range 0-100%)   STANDING Observation Posture: possible increased lumbar lordosis, obesity present.  Movement patterns and painful movements Pain and abberant motion with sit to stand, antalgic gait on L LE with first step, aberant motion when returning from retrieving glasses from floor and pain/guarding when reaching down.  AROM Lumbar AROM: Flexion: fingers to top of knees, pain to B lateral hips Extension: 50% central back pain  No effect when put each foot up on a stool Rotation:  R:  50% R sided back pain  L: 50% L sided back pain ++ Side Flexion:  R: 50%  L: 50%  Traction Alleviation Test Negative in neutral  Standing Lower Myotome Screen 10 SL heel raise:  R painful and unable L x10 much better control, still very low heel lift SITTING Neural Tension Testing Slump Flexion: positive L, negative R Extension: R positive with no change in head position L positive (worse than R) with worsening with head extension Difficulty controlling low back position  TREATMENT  Self-Care/Home Management Training: to educate patient in self care including ADL training, meal preparation, compensatory training, safety procedures/instructions, use of assistive technology devices or adaptive equipment.   Attempted lumbar self traction with hands on plinth allowing hips to hang but unable to tolerate on R wrist.   Education on diagnosis, prognosis, POC, anatomy and physiology of current condition.   Education on mechanical stressors/sensitivities and how to avoid nerve stretching/tension and modify some activities to avoid load and nerve tension sensitivities.    PATIENT EDUCATION:  Education details: Education on diagnosis, prognosis, POC, anatomy and physiology of current condition.  Person educated: Patient Education method: Explanation, Demonstration, and Verbal cues Education comprehension: verbalized understanding, returned demonstration, and needs further education  HOME EXERCISE PROGRAM: TBD Avoid L shaped positions Lay down on back with knees flexed for 5-10 min periodically throughout day  ASSESSMENT:  CLINICAL IMPRESSION: Patient is a 53 y.o. female referred to outpatient physical therapy with a medical diagnosis of lumbar radiculopathy who presents with signs and symptoms consistent with low back pain with radiation to bilateral LE with  mechanical sensitivities to nerve tension, compressive load, and possibly multidirectional ROM. Also likely sensitive to shear forces at the lumbar spine. Patient presents with significant pain, ROM, muscle performance (strength/power/endurance), balance, motor control, knowledge, nerve tension, gait, and activity tolerance impairments that are limiting ability to complete  usual activities such as sitting, walking, bed mobility, transfers, bending, dressing, lifting, personal care, traveling, social life, working as a Engineer, civil (consulting), homemaking, sleeping, and leisure without difficulty. Patient will benefit from skilled physical therapy intervention to address current body structure impairments and activity limitations to improve function and work towards goals set in current POC in order to return to prior level of function or maximal functional improvement.   Mechanical sensitivities: load, nerve tension (L > R), possibly multidirectional (but only tested under load).    OBJECTIVE IMPAIRMENTS: Abnormal gait, decreased activity tolerance, decreased balance, decreased coordination, decreased endurance, decreased knowledge of condition, decreased mobility, difficulty walking, decreased ROM, decreased strength, increased muscle spasms, impaired flexibility, improper body mechanics, obesity, and pain.   ACTIVITY LIMITATIONS: carrying, lifting, bending, sitting, standing, squatting, sleeping, stairs, transfers, bed mobility, bathing, dressing, locomotion level, and caring for others  PARTICIPATION LIMITATIONS: meal prep, cleaning, laundry, interpersonal relationship, driving, shopping, community activity, occupation, yard work, and church  PERSONAL FACTORS: Fitness, Past/current experiences, Time since onset of injury/illness/exacerbation, and 3+ comorbidities:   OSA (obstructive sleep apnea); S/P hysterectomy; Controlled diabetes mellitus type 2 with complications (HCC); Morbid obesity (HCC); Hyperlipidemia  associated with type 2 diabetes mellitus (HCC); Microscopic hematuria; Fatty liver; HTN (hypertension); Anxiety; Osteoarthritis of spine with radiculopathy, cervical region; Osteoarthritis of spine with radiculopathy, thoracic region; Cervical spondylosis with myelopathy and radiculopathy; Uveitis; Chronic low back pain; Disorder of right median nerve; History of carpal tunnel surgery of right wrist; Lumbar radiculopathy; Numbness of hand; Numbness of upper limb; Pain in right foot; and Chronic anterior uveitis of left eye on their problem list. she  has a past medical history of Allergy (2012), Anxiety, Arthritis, Asthma, Diabetes mellitus without complication (HCC) (2016), GERD (gastroesophageal reflux disease), Heart murmur, History of kidney stones, Hypertension, Neuromuscular disorder (HCC), PONV (postoperative nausea and vomiting), Shingles, and Sleep apnea. she  has a past surgical history that includes Cesarean section; Spinal fusion (2012); Total abdominal hysterectomy; Diagnostic laparoscopy (2006); Cholecystectomy (N/A, 01/18/2017); Colonoscopy with propofol  (N/A, 10/13/2020); Anterior cervical decomp/discectomy fusion (N/A, 04/13/2022); Extracorporeal shock wave lithotripsy (Left, 07/12/2022); Cystoscopy/ureteroscopy/holmium laser/stent placement (Left, 08/21/2022); and Carpel Tunnel  Surgery (Right, 10/01/2023). are also affecting patient's functional outcome.   REHAB POTENTIAL: Good  CLINICAL DECISION MAKING: Evolving/moderate complexity  EVALUATION COMPLEXITY: Moderate   GOALS: Goals reviewed with patient? No  SHORT TERM GOALS: Target date: 02/26/2024  Patient will be independent with initial home exercise program for self-management of symptoms. Baseline: Initial HEP provided at IE (02/12/24); Goal status: INITIAL   LONG TERM GOALS: Target date: 05/06/2024  Patient will be independent with a long-term home exercise program for self-management of symptoms.  Baseline: Initial HEP  provided at IE (02/12/24); Goal status: INITIAL  2.  Patient will demonstrate improved Modified Oswestry Disability Index (mODI) to equal or less than 10% to demonstrate improvement in overall condition and self-reported functional ability.  Baseline: 56% (02/12/24); Goal status: INITIAL  3.  Patient will demonstrate the ability to perform sit <> stand and bed mobility without increased pain and without pain behaviars to improve her ability to work and sleep.  Baseline: painful and altered movement pattern (02/12/24); Goal status: INITIAL  4.  Patient will demonstrate 10 rep heel raise equal bilaterally to demonstrate improved LE function for working, ADLs, and IADLs.  Baseline: unable to do heel raises on R side but able on L (02/12/24); Goal status: INITIAL  5.  Patient will demonstrate improvement in Patient Specific Functional Scale (PSFS) of equal or greater than 8/10 points to reflect clinically significant improvement in patient's most valued functional activities. Baseline: to be measured at visit 2 as appropriate (02/12/24); Goal status: INITIAL  6.  Patient will report NPRS equal or less than 3/10 during functional activities during the last 2 weeks to improve their abilitly to complete community, work and/or recreational activities with less limitation. Baseline: up to 10/10 (02/12/24); Goal status: INITIAL   PLAN:  PT FREQUENCY: 2x/week  PT DURATION: 8-12 weeks  PLANNED INTERVENTIONS: 97164- PT Re-evaluation, 97750- Physical Performance Testing, 97110-Therapeutic exercises, 97530- Therapeutic activity, [id number]- Neuromuscular re-education, 97535- Self Care, 02859- Manual therapy, [id number]- Electrical stimulation (unattended), 20560 (1-2 muscles), 20561 (3+ muscles)- Dry Needling, Patient/Family education, Balance training, Joint mobilization, Spinal mobilization, Cryotherapy, and Moist heat.  PLAN FOR NEXT SESSION: update HEP as appropriate, education on mechanical stressors and  modifications of activities to avoid them, recover motor control and awareness of modifiers to mechanical sensitivities, retrain motor patterns, regain ROM, improve strength and resilience needed for  performing desired functional performance with appropriate ROM, strength, power, and endurance. Manual therapy as needed.   Camie SAUNDERS. Juli, PT, DPT, Cert. MDT 02/12/24, 8:09 PM  [facility] [address] [place], [place] [address] P: [phone] I F: [phone]

## 2024-02-14 ENCOUNTER — Encounter: Payer: Self-pay | Admitting: Family Medicine

## 2024-02-17 ENCOUNTER — Encounter: Payer: Self-pay | Admitting: Physical Therapy

## 2024-02-17 ENCOUNTER — Ambulatory Visit: Admitting: Physical Therapy

## 2024-02-17 ENCOUNTER — Other Ambulatory Visit: Payer: Self-pay

## 2024-02-17 VITALS — BP 122/60 | HR 70

## 2024-02-17 DIAGNOSIS — M25631 Stiffness of right wrist, not elsewhere classified: Secondary | ICD-10-CM | POA: Diagnosis not present

## 2024-02-17 DIAGNOSIS — R2689 Other abnormalities of gait and mobility: Secondary | ICD-10-CM | POA: Diagnosis not present

## 2024-02-17 DIAGNOSIS — M5416 Radiculopathy, lumbar region: Secondary | ICD-10-CM

## 2024-02-17 DIAGNOSIS — L905 Scar conditions and fibrosis of skin: Secondary | ICD-10-CM | POA: Diagnosis not present

## 2024-02-17 DIAGNOSIS — M6281 Muscle weakness (generalized): Secondary | ICD-10-CM | POA: Diagnosis not present

## 2024-02-17 DIAGNOSIS — R208 Other disturbances of skin sensation: Secondary | ICD-10-CM | POA: Diagnosis not present

## 2024-02-17 NOTE — Therapy (Signed)
 OUTPATIENT PHYSICAL THERAPY  TREATMENT   Patient Name: Tricia Hill MRN: [id number] DOB:14-Sep-1970, 53 y.o., female Today's Date: 02/17/2024  END OF SESSION:  PT End of Session - 02/17/24 1040     Visit Number 2    Number of Visits 17    Date for PT Re-Evaluation 05/06/24    Authorization Type [place] [employer] P[employer] reporting from 02/12/2024    Progress Note Due on Visit 10    PT Start Time 1034    PT Stop Time 1134    PT Time Calculation (min) 60 min    Activity Tolerance Patient limited by pain;Patient tolerated treatment well    Behavior During Therapy [facility] for tasks assessed/performed           Past Medical History:  Diagnosis Date   Allergy 2012   Anxiety    Arthritis    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   Asthma    Diabetes mellitus without complication (HCC) 2016   GERD (gastroesophageal reflux disease)    Heart murmur    child   History of kidney stones    Hypertension    Neuromuscular disorder (HCC)    right hands tingling and numbness   PONV (postoperative nausea and vomiting)    Shingles    Sleep apnea    USE CPAP   Past Surgical History:  Procedure Laterality Date   ANTERIOR CERVICAL DECOMP/DISCECTOMY FUSION N/A 04/13/2022   Procedure: Anterior Cervical Discectomy Fusion - Cervical seven-Thoracic one removal of plate;  Surgeon: Louis Shove, MD;  Location: [facility] OR;  Service: Neurosurgery;  Laterality: N/A;   Carpel Tunnel Surgery Right 10/01/2023   CESAREAN SECTION     X 2   CHOLECYSTECTOMY N/A 01/18/2017   Procedure: LAPAROSCOPIC CHOLECYSTECTOMY WITH INTRAOPERATIVE CHOLANGIOGRAM;  Surgeon: Dessa Reyes ORN, MD;  Location: [facility] ORS;  Service: General;  Laterality: N/A;   COLONOSCOPY WITH PROPOFOL  N/A 10/13/2020   Procedure: COLONOSCOPY WITH PROPOFOL ;  Surgeon: Unk Corinn Skiff, MD;  Location: [facility] ENDOSCOPY;  Service: Gastroenterology;  Laterality: N/A;  COVID POSITIVE 09/30/2020   CYSTOSCOPY/URETEROSCOPY/HOLMIUM LASER/STENT PLACEMENT Left 08/21/2022    Procedure: CYSTOSCOPY/URETEROSCOPY/HOLMIUM LASER/STENT PLACEMENT;  Surgeon: Twylla Glendia BROCKS, MD;  Location: [facility] ORS;  Service: Urology;  Laterality: Left;   DIAGNOSTIC LAPAROSCOPY  2006   EXCISION OF ABDOMINAL WALL ENDOMETRIOMA   EXTRACORPOREAL SHOCK WAVE LITHOTRIPSY Left 07/12/2022   Procedure: EXTRACORPOREAL SHOCK WAVE LITHOTRIPSY (ESWL);  Surgeon: Penne Knee, MD;  Location: [facility] ORS;  Service: Urology;  Laterality: Left;   SPINAL FUSION  2012   C3 and C4   TOTAL ABDOMINAL HYSTERECTOMY     Partial   Patient Active Problem List   Diagnosis Date Noted   Chronic anterior uveitis of left eye 02/05/2024   Chronic low back pain 12/03/2023   Disorder of right median nerve 12/03/2023   Lumbar radiculopathy 12/03/2023   Numbness of hand 12/03/2023   History of carpal tunnel surgery of right wrist 10/01/2023   Uveitis 07/11/2023   Numbness of upper limb 05/28/2023   Pain in right foot 05/28/2023   Cervical spondylosis with myelopathy and radiculopathy 04/13/2022   Osteoarthritis of spine with radiculopathy, cervical region 02/08/2022   Osteoarthritis of spine with radiculopathy, thoracic region 02/08/2022   Anxiety 09/05/2021   HTN (hypertension) 04/16/2018   Fatty liver 12/07/2016   Microscopic hematuria 05/26/2015   OSA (obstructive sleep apnea) 01/31/2015   S/P hysterectomy 01/31/2015   Controlled diabetes mellitus type 2 with complications (HCC) 01/31/2015   Morbid obesity (HCC) 01/31/2015  Hyperlipidemia associated with type 2 diabetes mellitus (HCC) 01/31/2015    PCP: Vicci Duwaine SQUIBB, DO  REFERRING PROVIDER: Louis Shove, MD (neurosurgery)  REFERRING DIAG: lumbar radiculopathy  Rationale for Evaluation and Treatment: Rehabilitation  THERAPY DIAG:  Radiculopathy, lumbar region  Muscle weakness (generalized)  Other abnormalities of gait and mobility  ONSET DATE: for > 5 years but legs worse over the last few months, especially since carpal tunnel  surgery  SUBJECTIVE:                                                                                                                                                                                            PERTINENT HISTORY:  Patient is a 53 y.o. female who presents to outpatient physical therapy with a referral for medical diagnosis lumbar radiculopathy. This patient's chief complaints consist of low back and leg pain and leg weakness leading to the following functional deficits: difficulty with usual activities such as sitting, walking, bed mobility, transfers, bending, dressing, lifting, personal care, traveling, social life, working as a Engineer, civil (consulting), homemaking, sleeping, and leisure. Relevant past medical history and comorbidities include the following: she has OSA (obstructive sleep apnea); S/P hysterectomy; Controlled diabetes mellitus type 2 with complications (HCC); Morbid obesity (HCC); Hyperlipidemia associated with type 2 diabetes mellitus (HCC); Microscopic hematuria; Fatty liver; HTN (hypertension); Anxiety; Osteoarthritis of spine with radiculopathy, cervical region; Osteoarthritis of spine with radiculopathy, thoracic region; Cervical spondylosis with myelopathy and radiculopathy; Uveitis; Chronic low back pain; Disorder of right median nerve; History of carpal tunnel surgery of right wrist; Lumbar radiculopathy; Numbness of hand; Numbness of upper limb; Pain in right foot; and Chronic anterior uveitis of left eye on their problem list. she  has a past medical history of Allergy (2012), Anxiety, Arthritis, Asthma, Diabetes mellitus without complication (HCC) (2016), GERD (gastroesophageal reflux disease), Heart murmur, History of kidney stones, Hypertension, Neuromuscular disorder (HCC), PONV (postoperative nausea and vomiting), Shingles, and Sleep apnea. she  has a past surgical history that includes Cesarean section; Spinal fusion (2012); Total abdominal hysterectomy; Diagnostic laparoscopy  (2006); Cholecystectomy (N/A, 01/18/2017); Colonoscopy with propofol  (N/A, 10/13/2020); Anterior cervical decomp/discectomy fusion (N/A, 04/13/2022); Extracorporeal shock wave lithotripsy (Left, 07/12/2022); Cystoscopy/ureteroscopy/holmium laser/stent placement (Left, 08/21/2022); and Carpel Tunnel Surgery (Right, 10/01/2023). Patient denies hx of cancer, stroke, seizures, lung problems, and unexplained stumbling or dropping things. Drops things. Has had 2 neck surgeries but no low back surgeries.   She states she has urinary incontinence and this has worsened over time. She is always changing her underwear or wearing a pad. Usually it is when coughing and sneezing. Also has urgency and it can start leaking before  she gets to the bathroom. It is harder to hold the closer she gets to the toilet.   Exercise history: joined the gym in hopes to lose weight but cannot go because of low back pian. She wanted to do weight lifting.   She is terrified because her mom has similar back problems and cannot do anything now and has sort of given up. Her sister is also going through the same thing with her back. Patient's kids are getting married and she wants to be active and do things with her grandchildren.   SUBJECTIVE STATEMENT: Patient states she is a little sore after getting off work at 8am. She started back to work Friday night and last night. She had the same response on Friday. She has been feeling it more since working. She does not feel it as much on the days she doesn't work. 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 She also had a lot of arm pain after going back to work and her neck has been bothering her more.   PAIN: NPRS: Current: 6/10 gluteal fold to top of feet.   From initial PT evaluation on 02/12/24:  Best: 0/10, Worst: 10/10. Pain location: Across top of pelvis (was more on R but now more L). This is the starting area. It would ache and get better. Now it is constantly there. She now  has intermittent pain down the back of both thighs to the tops of both feet which can happen together or be one side or the other.  Pain description: Dull, occasionally sharp like a zap behind left knee Aggravating factors: Getting up from sitting, prolonged standing ( ), prolonged walking (10 min), sitting (legs hurt), standing legs and back hurt, sitting with legs propped up in front of her makes her legs hurt more, bending.  Relieving factors: injection partially, sitting can help her back (but makes her legs hurt)   PRECAUTIONS: No lifting of 10# or more due to R hand (recommended by OT so it doesn't damage her neck and arm until she gets more strength in her arm).   WEIGHT BEARING RESTRICTIONS: No  PATIENT GOALS:  I just want the pain to be more bearable. Be able to do some of the things I used to do. Walking for fitness with husband.   OBJECTIVE  Vitals:   02/17/24 1044  BP: 122/60  Pulse: 70  SpO2: 97%    SELF-REPORTED FUNCTION Patient Specific Functional Scale (PSFS)  Walking: 3/10 Go out with friends shopping: 4/10 Sitting: 3.5/10 Average: 3.5/10  PRONE Psoas stiffness and end feel Compare sides  Prone Iliopsoas Length Assessment (manual)  R: increased stiffness compared to left, difficult to tell difference with sidebending due to pt difficulty with the position and reporting, appeared to be increasing discomfort over right low back and lateral pelvis with moving around and retesting, so discontinued attempts to differentiate this way. If it is hip capsule, it is remarkably limited.   L: less stiffness compared to right, anterior hip discomfort, did not test with trunk sidebending due to distress from patient trying to test the right.   TREATMENT  Self-Care/Home Management Training: to educate patient in self care including ADL training, meal  preparation, compensatory training, safety procedures/instructions, use of assistive technology devices or adaptive equipment.   Education and practice of log roll technique for supine <> sit for neck and lumbar load sensitivity so pt can get in and out of the bed with less irritation to her spine. Added to HEP  Manual therapy: to reduce pain and tissue tension, improve range of motion, neuromodulation, in order to promote improved ability to complete functional activities. Hooklying intermittent lumbar traction with belt around knees 10x10 seconds on/off with decreasing speed of pull/release and decreased force improving comfort (originally slightly increased discomfort with pull compared to not pulling). Pausing to better communicate   Neuromuscular Re-education: a technique or exercise performed with the goal of improving the level of communication between the body and the brain, such as for balance, motor control, muscle activation patterns, coordination, desensitization, quality of muscle contraction, proprioception, and/or kinesthetic sense needed for successful and safe completion of functional activities.   Vitals for system's review prior to exercise (see above)  Hooklying pelvic tilt AROM in pain limited/free range 1x10 after finding pain limited range and learning how to perform Tactile cuing under lumbar spine Feels better in PPT  Sidelying pelvic tilt AROM in pain limited/free range 1x10 after finding pain limited range and learning how to perform Feels better than in supine  Prone pelvic tilt AROM in pain limited/free range with folded pillow under abdomen 1x10 after finding pain limited range and learning how to perform Minimal movement  Prone (with folded pillow under abdomen) alternating hip extension with slight PPT, focused on stabilizing lumbar spine with TrA contraction and pelvic floor contraction, cuing thought of hip extension and moving into hip extension only as far  as possible without pain provocation and while keeping lumbopelvic control locked in.  Reps to learn technique with step by step and multimodal cuing, then  1x10 each side with 5 second hold Added to HEP  Education with visual aide of muscles targeted and purpose of prone exercise  Pt required multimodal cuing for proper technique and to facilitate improved neuromuscular control, strength, range of motion, and functional ability resulting in improved performance and form.  PATIENT EDUCATION:  Education details: Exercise purpose/form. Self management techniques. Education on diagnosis, prognosis, POC, anatomy and physiology of current condition. Education on HEP including handout. Person educated: Patient Education method: Explanation, Demonstration, and Verbal cues, handouts Education comprehension: verbalized understanding, returned demonstration, and needs further education  HOME EXERCISE PROGRAM: Access Code: [email or web address] URL: [email or web address] Date: 02/17/2024 Prepared by: Camie Cleverly  Exercises - Sitting to Supine Roll  - Prone Hip Extension with Pillow Under Abdomen  - 1 x daily - 3 sets - 5 reps - 5 second hold hold  Avoid L shaped positions Lay down on back with knees flexed for 5-10 min periodically throughout day  ASSESSMENT:  CLINICAL IMPRESSION: Patient returns with continued pain but not significantly worse after return to work. She continues to demonstrate pain with transitional movements and extension of her lumbar spine (also in offloaded position) suggesting lumbar extension sensitivity. Today's session focused on improving patient's ability to perform bed mobility with less irritation to the spine and starting to gain awareness and control of the lumbopelvic region. She required a lot of concentration to coordinate her lumbar spine while moving her legs but was able to perform this with less/no increased pain with proper control and limitation of ROM at  the lumbar  spine. She also demonstrates bilateral hip extension limitations, likely with iliopsoas tightness that would benefit from intervention to decrease the anterior pull on the lumbar spine where it attaches. Patient mostly kept in an unloaded position this session and reported feeling a bit better by end of session, despite having some sensitivity to traction (this was improved with decreased traction pull and rate of pull/release). Patient needs continued education on activity modification techniques and interventions for pain relief and improved activity tolerance to improve her activity tolerance and restore function.  Patient would benefit from continued management of limiting condition by skilled physical therapist to address remaining impairments and functional limitations to work towards stated goals and return to PLOF or maximal functional independence.    From initial PT evaluation on 02/12/2024:  Patient is a 53 y.o. female referred to outpatient physical therapy with a medical diagnosis of lumbar radiculopathy who presents with signs and symptoms consistent with low back pain with radiation to bilateral LE with mechanical sensitivities to nerve tension, compressive load, and possibly multidirectional ROM. Also likely sensitive to shear forces at the lumbar spine. Patient presents with significant pain, ROM, muscle performance (strength/power/endurance), balance, motor control, knowledge, nerve tension, gait, and activity tolerance impairments that are limiting ability to complete usual activities such as sitting, walking, bed mobility, transfers, bending, dressing, lifting, personal care, traveling, social life, working as a Engineer, civil (consulting), homemaking, sleeping, and leisure without difficulty. Patient will benefit from skilled physical therapy intervention to address current body structure impairments and activity limitations to improve function and work towards goals set in current POC in order to return  to prior level of function or maximal functional improvement.   Mechanical sensitivities: load, nerve tension (L > R), possibly multidirectional (but only tested under load).    OBJECTIVE IMPAIRMENTS: Abnormal gait, decreased activity tolerance, decreased balance, decreased coordination, decreased endurance, decreased knowledge of condition, decreased mobility, difficulty walking, decreased ROM, decreased strength, increased muscle spasms, impaired flexibility, improper body mechanics, obesity, and pain.   ACTIVITY LIMITATIONS: carrying, lifting, bending, sitting, standing, squatting, sleeping, stairs, transfers, bed mobility, bathing, dressing, locomotion level, and caring for others  PARTICIPATION LIMITATIONS: meal prep, cleaning, laundry, interpersonal relationship, driving, shopping, community activity, occupation, yard work, and church  PERSONAL FACTORS: Fitness, Past/current experiences, Time since onset of injury/illness/exacerbation, and 3+ comorbidities:   OSA (obstructive sleep apnea); S/P hysterectomy; Controlled diabetes mellitus type 2 with complications (HCC); Morbid obesity (HCC); Hyperlipidemia associated with type 2 diabetes mellitus (HCC); Microscopic hematuria; Fatty liver; HTN (hypertension); Anxiety; Osteoarthritis of spine with radiculopathy, cervical region; Osteoarthritis of spine with radiculopathy, thoracic region; Cervical spondylosis with myelopathy and radiculopathy; Uveitis; Chronic low back pain; Disorder of right median nerve; History of carpal tunnel surgery of right wrist; Lumbar radiculopathy; Numbness of hand; Numbness of upper limb; Pain in right foot; and Chronic anterior uveitis of left eye on their problem list. she  has a past medical history of Allergy (2012), Anxiety, Arthritis, Asthma, Diabetes mellitus without complication (HCC) (2016), GERD (gastroesophageal reflux disease), Heart murmur, History of kidney stones, Hypertension, Neuromuscular disorder (HCC),  PONV (postoperative nausea and vomiting), Shingles, and Sleep apnea. she  has a past surgical history that includes Cesarean section; Spinal fusion (2012); Total abdominal hysterectomy; Diagnostic laparoscopy (2006); Cholecystectomy (N/A, 01/18/2017); Colonoscopy with propofol  (N/A, 10/13/2020); Anterior cervical decomp/discectomy fusion (N/A, 04/13/2022); Extracorporeal shock wave lithotripsy (Left, 07/12/2022); Cystoscopy/ureteroscopy/holmium laser/stent placement (Left, 08/21/2022); and Carpel Tunnel Surgery (Right, 10/01/2023). are also affecting patient's functional outcome.   REHAB POTENTIAL: Good  CLINICAL DECISION  MAKING: Evolving/moderate complexity  EVALUATION COMPLEXITY: Moderate   GOALS: Goals reviewed with patient? Yes  SHORT TERM GOALS: Target date: 02/26/2024  Patient will be independent with initial home exercise program for self-management of symptoms. Baseline: Initial HEP provided at IE (02/12/24); Goal status: In progress   LONG TERM GOALS: Target date: 05/06/2024  Patient will be independent with a long-term home exercise program for self-management of symptoms.  Baseline: Initial HEP provided at IE (02/12/24); Goal status: In progress  2.  Patient will demonstrate improved Modified Oswestry Disability Index (mODI) to equal or less than 10% to demonstrate improvement in overall condition and self-reported functional ability.  Baseline: 56% (02/12/24); Goal status: In progress  3.  Patient will demonstrate the ability to perform sit <> stand and bed mobility without increased pain and without pain behaviars to improve her ability to work and sleep.  Baseline: painful and altered movement pattern (02/12/24); Goal status: In progress  4.  Patient will demonstrate 10 rep heel raise equal bilaterally to demonstrate improved LE function for working, ADLs, and IADLs.  Baseline: unable to do heel raises on R side but able on L (02/12/24); Goal status: In progress  5.   Patient will demonstrate improvement in Patient Specific Functional Scale (PSFS) of equal or greater than 8/10 points to reflect clinically significant improvement in patient's most valued functional activities. Baseline: to be measured at visit 2 as appropriate (02/12/24); 3.5/10 (02/17/2024);  Goal status: in progress  6.  Patient will report NPRS equal or less than 3/10 during functional activities during the last 2 weeks to improve their abilitly to complete community, work and/or recreational activities with less limitation. Baseline: up to 10/10 (02/12/24); Goal status: In progress   PLAN:  PT FREQUENCY: 2x/week  PT DURATION: 8-12 weeks  PLANNED INTERVENTIONS: 97164- PT Re-evaluation, 97750- Physical Performance Testing, 97110-Therapeutic exercises, 97530- Therapeutic activity, 97112- Neuromuscular re-education, 97535- Self Care, 02859- Manual therapy, [id number]- Electrical stimulation (unattended), 20560 (1-2 muscles), 20561 (3+ muscles)- Dry Needling, Patient/Family education, Balance training, Joint mobilization, Spinal mobilization, Cryotherapy, and Moist heat.  PLAN FOR NEXT SESSION: update HEP as appropriate, education on mechanical stressors and modifications of activities to avoid them, recover motor control and awareness of modifiers to mechanical sensitivities, retrain motor patterns, regain ROM, improve strength and resilience needed for  performing desired functional performance with appropriate ROM, strength, power, and endurance. Manual therapy as needed.   Camie SAUNDERS. Juli, PT, DPT, Cert. MDT 02/17/24, 12:29 PM  [facility] [address] [place], [place] [address] P: [phone] I F: [phone]

## 2024-02-18 ENCOUNTER — Ambulatory Visit: Admitting: Occupational Therapy

## 2024-02-18 ENCOUNTER — Encounter: Admitting: Physical Therapy

## 2024-02-19 DIAGNOSIS — M5416 Radiculopathy, lumbar region: Secondary | ICD-10-CM | POA: Diagnosis not present

## 2024-02-24 ENCOUNTER — Ambulatory Visit: Admitting: Physical Therapy

## 2024-02-24 ENCOUNTER — Ambulatory Visit: Admitting: Occupational Therapy

## 2024-02-24 ENCOUNTER — Encounter: Payer: Self-pay | Admitting: Physical Therapy

## 2024-02-24 DIAGNOSIS — R208 Other disturbances of skin sensation: Secondary | ICD-10-CM | POA: Diagnosis not present

## 2024-02-24 DIAGNOSIS — M5416 Radiculopathy, lumbar region: Secondary | ICD-10-CM | POA: Diagnosis not present

## 2024-02-24 DIAGNOSIS — L905 Scar conditions and fibrosis of skin: Secondary | ICD-10-CM | POA: Diagnosis not present

## 2024-02-24 DIAGNOSIS — R2689 Other abnormalities of gait and mobility: Secondary | ICD-10-CM | POA: Diagnosis not present

## 2024-02-24 DIAGNOSIS — M6281 Muscle weakness (generalized): Secondary | ICD-10-CM | POA: Diagnosis not present

## 2024-02-24 DIAGNOSIS — M25631 Stiffness of right wrist, not elsewhere classified: Secondary | ICD-10-CM | POA: Diagnosis not present

## 2024-02-24 NOTE — Therapy (Signed)
 OUTPATIENT PHYSICAL THERAPY  TREATMENT   Patient Name: Tricia Hill MRN: [id number] DOB:1970/10/25, 53 y.o., female Today's Date: 02/24/2024  END OF SESSION:  PT End of Session - 02/24/24 2004     Visit Number 3    Number of Visits 17    Date for PT Re-Evaluation 05/06/24    Authorization Type [place] [employer] P[employer] reporting from 02/12/2024    Progress Note Due on Visit 10    PT Start Time 1819    PT Stop Time 1902    PT Time Calculation (min) 43 min    Activity Tolerance Patient limited by pain;Patient tolerated treatment well    Behavior During Therapy [facility] for tasks assessed/performed            Past Medical History:  Diagnosis Date   Allergy 2012   Anxiety    Arthritis    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   Asthma    Diabetes mellitus without complication (HCC) 2016   GERD (gastroesophageal reflux disease)    Heart murmur    child   History of kidney stones    Hypertension    Neuromuscular disorder (HCC)    right hands tingling and numbness   PONV (postoperative nausea and vomiting)    Shingles    Sleep apnea    USE CPAP   Past Surgical History:  Procedure Laterality Date   ANTERIOR CERVICAL DECOMP/DISCECTOMY FUSION N/A 04/13/2022   Procedure: Anterior Cervical Discectomy Fusion - Cervical seven-Thoracic one removal of plate;  Surgeon: Louis Shove, MD;  Location: [facility];  Service: Neurosurgery;  Laterality: N/A;   Carpel Tunnel Surgery Right 10/01/2023   CESAREAN SECTION     X 2   CHOLECYSTECTOMY N/A 01/18/2017   Procedure: LAPAROSCOPIC CHOLECYSTECTOMY WITH INTRAOPERATIVE CHOLANGIOGRAM;  Surgeon: Dessa Reyes ORN, MD;  Location: [facility] ORS;  Service: General;  Laterality: N/A;   COLONOSCOPY WITH PROPOFOL  N/A 10/13/2020   Procedure: COLONOSCOPY WITH PROPOFOL ;  Surgeon: Unk Corinn Skiff, MD;  Location: [facility] ENDOSCOPY;  Service: Gastroenterology;  Laterality: N/A;  COVID POSITIVE 09/30/2020   CYSTOSCOPY/URETEROSCOPY/HOLMIUM LASER/STENT PLACEMENT Left  08/21/2022   Procedure: CYSTOSCOPY/URETEROSCOPY/HOLMIUM LASER/STENT PLACEMENT;  Surgeon: Twylla Glendia BROCKS, MD;  Location: [facility] ORS;  Service: Urology;  Laterality: Left;   DIAGNOSTIC LAPAROSCOPY  2006   EXCISION OF ABDOMINAL WALL ENDOMETRIOMA   EXTRACORPOREAL SHOCK WAVE LITHOTRIPSY Left 07/12/2022   Procedure: EXTRACORPOREAL SHOCK WAVE LITHOTRIPSY (ESWL);  Surgeon: Penne Knee, MD;  Location: [facility] ORS;  Service: Urology;  Laterality: Left;   SPINAL FUSION  2012   C3 and C4   TOTAL ABDOMINAL HYSTERECTOMY     Partial   Patient Active Problem List   Diagnosis Date Noted   Chronic anterior uveitis of left eye 02/05/2024   Chronic low back pain 12/03/2023   Disorder of right median nerve 12/03/2023   Lumbar radiculopathy 12/03/2023   Numbness of hand 12/03/2023   History of carpal tunnel surgery of right wrist 10/01/2023   Uveitis 07/11/2023   Numbness of upper limb 05/28/2023   Pain in right foot 05/28/2023   Cervical spondylosis with myelopathy and radiculopathy 04/13/2022   Osteoarthritis of spine with radiculopathy, cervical region 02/08/2022   Osteoarthritis of spine with radiculopathy, thoracic region 02/08/2022   Anxiety 09/05/2021   HTN (hypertension) 04/16/2018   Fatty liver 12/07/2016   Microscopic hematuria 05/26/2015   OSA (obstructive sleep apnea) 01/31/2015   S/P hysterectomy 01/31/2015   Controlled diabetes mellitus type 2 with complications (HCC) 01/31/2015   Morbid obesity (HCC)  01/31/2015   Hyperlipidemia associated with type 2 diabetes mellitus (HCC) 01/31/2015    PCP: Vicci Duwaine SQUIBB, DO  REFERRING PROVIDER: Louis Shove, MD (neurosurgery)  REFERRING DIAG: lumbar radiculopathy  Rationale for Evaluation and Treatment: Rehabilitation  THERAPY DIAG:  Radiculopathy, lumbar region  Muscle weakness (generalized)  ONSET DATE: for > 5 years but legs worse over the last few months, especially since carpal tunnel surgery  SUBJECTIVE:                                                                                                                                                                                             PERTINENT HISTORY:  Patient is a 53 y.o. female who presents to outpatient physical therapy with a referral for medical diagnosis lumbar radiculopathy. This patient's chief complaints consist of low back and leg pain and leg weakness leading to the following functional deficits: difficulty with usual activities such as sitting, walking, bed mobility, transfers, bending, dressing, lifting, personal care, traveling, social life, working as a Engineer, civil (consulting), homemaking, sleeping, and leisure. Relevant past medical history and comorbidities include the following: she has OSA (obstructive sleep apnea); S/P hysterectomy; Controlled diabetes mellitus type 2 with complications (HCC); Morbid obesity (HCC); Hyperlipidemia associated with type 2 diabetes mellitus (HCC); Microscopic hematuria; Fatty liver; HTN (hypertension); Anxiety; Osteoarthritis of spine with radiculopathy, cervical region; Osteoarthritis of spine with radiculopathy, thoracic region; Cervical spondylosis with myelopathy and radiculopathy; Uveitis; Chronic low back pain; Disorder of right median nerve; History of carpal tunnel surgery of right wrist; Lumbar radiculopathy; Numbness of hand; Numbness of upper limb; Pain in right foot; and Chronic anterior uveitis of left eye on their problem list. she  has a past medical history of Allergy (2012), Anxiety, Arthritis, Asthma, Diabetes mellitus without complication (HCC) (2016), GERD (gastroesophageal reflux disease), Heart murmur, History of kidney stones, Hypertension, Neuromuscular disorder (HCC), PONV (postoperative nausea and vomiting), Shingles, and Sleep apnea. she  has a past surgical history that includes Cesarean section; Spinal fusion (2012); Total abdominal hysterectomy; Diagnostic laparoscopy (2006); Cholecystectomy (N/A, 01/18/2017);  Colonoscopy with propofol  (N/A, 10/13/2020); Anterior cervical decomp/discectomy fusion (N/A, 04/13/2022); Extracorporeal shock wave lithotripsy (Left, 07/12/2022); Cystoscopy/ureteroscopy/holmium laser/stent placement (Left, 08/21/2022); and Carpel Tunnel Surgery (Right, 10/01/2023). Patient denies hx of cancer, stroke, seizures, lung problems, and unexplained stumbling or dropping things. Drops things. Has had 2 neck surgeries but no low back surgeries.   She states she has urinary incontinence and this has worsened over time. She is always changing her underwear or wearing a pad. Usually it is when coughing and sneezing. Also has urgency and it can start leaking before she gets to the  bathroom. It is harder to hold the closer she gets to the toilet.   Exercise history: joined the gym in hopes to lose weight but cannot go because of low back pian. She wanted to do weight lifting.   She is terrified because her mom has similar back problems and cannot do anything now and has sort of given up. Her sister is also going through the same thing with her back. Patient's kids are getting married and she wants to be active and do things with her grandchildren.   SUBJECTIVE STATEMENT: Patient states she got two lumbar injections the the 10th, and the next day her legs started feeling better. She state she still has pain across her low back and near the bilateral gluteal folds. She worked last night and it was really bothering her, so she rotated between a heat and cold pack while sitting in her chair. Standing for 10 minutes makes it hurt. Sitting too long hurts too. It helps if she can lean on something when standing. She states she was a little sore after her last PT session but each day was a little better. She thinks the soreness was from working muscles, but she is not sure if it was the same pain or not. She was sick for a few days, then she did her HEP 3 days since last PT session.    PAIN: NPRS: Current:  5/10 across low back and B gluteal fold  From initial PT evaluation on 02/12/24:  Best: 0/10, Worst: 10/10. Pain location: Across top of pelvis (was more on R but now more L). This is the starting area. It would ache and get better. Now it is constantly there. She now has intermittent pain down the back of both thighs to the tops of both feet which can happen together or be one side or the other.  Pain description: Dull, occasionally sharp like a zap behind left knee Aggravating factors: Getting up from sitting, prolonged standing ( ), prolonged walking (10 min), sitting (legs hurt), standing legs and back hurt, sitting with legs propped up in front of her makes her legs hurt more, bending.  Relieving factors: injection partially, sitting can help her back (but makes her legs hurt)   PRECAUTIONS: No lifting of 10# or more due to R hand (recommended by OT so it doesn't damage her neck and arm until she gets more strength in her arm).   WEIGHT BEARING RESTRICTIONS: No  PATIENT GOALS:  I just want the pain to be more bearable. Be able to do some of the things I used to do. Walking for fitness with husband.   OBJECTIVE   TREATMENT                                                                                                                          Manual therapy: to reduce pain and tissue tension, improve range of motion, neuromodulation, in order to promote improved ability to complete functional activities.  PRONE with folded pillow supporting lumbar spine and contralateral LE on floor with wedge under heel PROM psoas stretch 3-4x30 seconds each side with  2-3x 7 second isometric hip flexion contraction for contract-relax between PROM stretches Accidentally placed wedge in wrong place on L side at first (increasing not decreasing neural tension)  Hooklying intermittent lumbar traction with belt around knees 6x10 seconds on/off   Therapeutic exercise: therapeutic exercises that  incorporate ONE parameter at one or more areas of the body to centralize symptoms, develop strength and endurance, range of motion, and flexibility required for successful completion of functional activities. Attempted half kneeling psoas self-stretch, but pt unable to perform pelvic tilt in this position on either side despite multiple attempts and cuing. Discontinued for now.   Neuromuscular Re-education: a technique or exercise performed with the goal of improving the level of communication between the body and the brain, such as for balance, motor control, muscle activation patterns, coordination, desensitization, quality of muscle contraction, proprioception, and/or kinesthetic sense needed for successful and safe completion of functional activities.   Prone (with folded pillow under abdomen) alternating hip extension with slight PPT, focused on stabilizing lumbar spine with TrA contraction and pelvic floor contraction, cuing thought of hip extension and moving into hip extension only as far as possible without pain provocation and while keeping lumbopelvic control locked in.  1x10 each side with 5 second hold Good carry over  Hooklying pelvic tilt AROM in pain limited/free range 1x10 after finding pain limited range and learning how to perform Tactile cuing under lumbar spine Added to HEP  Self-Care/Home Management Training: to educate patient in self care including ADL training, meal preparation, compensatory training, safety procedures/instructions, use of assistive technology devices or adaptive equipment.  Education with handouts about mechanical stressors and how to modify activities to avoid load and neural tension sensitivities.    Pt required multimodal cuing for proper technique and to facilitate improved neuromuscular control, strength, range of motion, and functional ability resulting in improved performance and form.  PATIENT EDUCATION:  Education details: Exercise  purpose/form. Self management techniques. Education on diagnosis, prognosis, POC, anatomy and physiology of current condition. Education on HEP including handout. Person educated: Patient Education method: Explanation, Demonstration, and Verbal cues, handouts Education comprehension: verbalized understanding, returned demonstration, and needs further education  HOME EXERCISE PROGRAM: Access Code: [email or web address] URL: [email or web address] Date: 02/24/2024 Prepared by: Camie Cleverly  Exercises - Sitting to Supine Roll  - Prone Hip Extension with Pillow Under Abdomen  - 1 x daily - 3 sets - 5-10 reps - 5 second hold hold - Supine Pelvic Tilt  - 1 x daily - 1 sets - 20 reps  Avoid L shaped positions Lay down on back with knees flexed for 5-10 min periodically throughout day  ASSESSMENT:  CLINICAL IMPRESSION: Patient's legs feeling better after recent injection but still painful in low back and glutes. Manual psoas stretch improved low back pain and patient was able to tolerate hooklying where she found lumbar extension painful but not lumbar flexion via pelvic tilt. She was unable to coordinate pelvic tilt in half kneeling for psoas self-stretch so this was deferred until she has better lumbopelvic control. Hooklying did irritate her coccyx region while her low back felt better. Plan to continue with progression of learning how to control and stabilize lumbopelvic region, then move to more functional positions and activities as tolerated. Currently appears to be unable to avoid aggravating load and extension sensitivities. Patient would benefit from continued management of  limiting condition by skilled physical therapist to address remaining impairments and functional limitations to work towards stated goals and return to PLOF or maximal functional independence.   Mechanical sensitivities: load, nerve tension (L > R), extension (possibly multidirectional but other motions only tested under  load).    From initial PT evaluation on 02/12/2024:  Patient is a 53 y.o. female referred to outpatient physical therapy with a medical diagnosis of lumbar radiculopathy who presents with signs and symptoms consistent with low back pain with radiation to bilateral LE with mechanical sensitivities to nerve tension, compressive load, and possibly multidirectional ROM. Also likely sensitive to shear forces at the lumbar spine. Patient presents with significant pain, ROM, muscle performance (strength/power/endurance), balance, motor control, knowledge, nerve tension, gait, and activity tolerance impairments that are limiting ability to complete usual activities such as sitting, walking, bed mobility, transfers, bending, dressing, lifting, personal care, traveling, social life, working as a Engineer, civil (consulting), homemaking, sleeping, and leisure without difficulty. Patient will benefit from skilled physical therapy intervention to address current body structure impairments and activity limitations to improve function and work towards goals set in current POC in order to return to prior level of function or maximal functional improvement.      OBJECTIVE IMPAIRMENTS: Abnormal gait, decreased activity tolerance, decreased balance, decreased coordination, decreased endurance, decreased knowledge of condition, decreased mobility, difficulty walking, decreased ROM, decreased strength, increased muscle spasms, impaired flexibility, improper body mechanics, obesity, and pain.   ACTIVITY LIMITATIONS: carrying, lifting, bending, sitting, standing, squatting, sleeping, stairs, transfers, bed mobility, bathing, dressing, locomotion level, and caring for others  PARTICIPATION LIMITATIONS: meal prep, cleaning, laundry, interpersonal relationship, driving, shopping, community activity, occupation, yard work, and church  PERSONAL FACTORS: Fitness, Past/current experiences, Time since onset of injury/illness/exacerbation, and 3+  comorbidities:   OSA (obstructive sleep apnea); S/P hysterectomy; Controlled diabetes mellitus type 2 with complications (HCC); Morbid obesity (HCC); Hyperlipidemia associated with type 2 diabetes mellitus (HCC); Microscopic hematuria; Fatty liver; HTN (hypertension); Anxiety; Osteoarthritis of spine with radiculopathy, cervical region; Osteoarthritis of spine with radiculopathy, thoracic region; Cervical spondylosis with myelopathy and radiculopathy; Uveitis; Chronic low back pain; Disorder of right median nerve; History of carpal tunnel surgery of right wrist; Lumbar radiculopathy; Numbness of hand; Numbness of upper limb; Pain in right foot; and Chronic anterior uveitis of left eye on their problem list. she  has a past medical history of Allergy (2012), Anxiety, Arthritis, Asthma, Diabetes mellitus without complication (HCC) (2016), GERD (gastroesophageal reflux disease), Heart murmur, History of kidney stones, Hypertension, Neuromuscular disorder (HCC), PONV (postoperative nausea and vomiting), Shingles, and Sleep apnea. she  has a past surgical history that includes Cesarean section; Spinal fusion (2012); Total abdominal hysterectomy; Diagnostic laparoscopy (2006); Cholecystectomy (N/A, 01/18/2017); Colonoscopy with propofol  (N/A, 10/13/2020); Anterior cervical decomp/discectomy fusion (N/A, 04/13/2022); Extracorporeal shock wave lithotripsy (Left, 07/12/2022); Cystoscopy/ureteroscopy/holmium laser/stent placement (Left, 08/21/2022); and Carpel Tunnel Surgery (Right, 10/01/2023). are also affecting patient's functional outcome.   REHAB POTENTIAL: Good  CLINICAL DECISION MAKING: Evolving/moderate complexity  EVALUATION COMPLEXITY: Moderate   GOALS: Goals reviewed with patient? Yes  SHORT TERM GOALS: Target date: 02/26/2024  Patient will be independent with initial home exercise program for self-management of symptoms. Baseline: Initial HEP provided at IE (02/12/24); Goal status: In  progress   LONG TERM GOALS: Target date: 05/06/2024  Patient will be independent with a long-term home exercise program for self-management of symptoms.  Baseline: Initial HEP provided at IE (02/12/24); Goal status: In progress  2.  Patient will demonstrate improved Modified Oswestry Disability  Index (mODI) to equal or less than 10% to demonstrate improvement in overall condition and self-reported functional ability.  Baseline: 56% (02/12/24); Goal status: In progress  3.  Patient will demonstrate the ability to perform sit <> stand and bed mobility without increased pain and without pain behaviars to improve her ability to work and sleep.  Baseline: painful and altered movement pattern (02/12/24); Goal status: In progress  4.  Patient will demonstrate 10 rep heel raise equal bilaterally to demonstrate improved LE function for working, ADLs, and IADLs.  Baseline: unable to do heel raises on R side but able on L (02/12/24); Goal status: In progress  5.  Patient will demonstrate improvement in Patient Specific Functional Scale (PSFS) of equal or greater than 8/10 points to reflect clinically significant improvement in patient's most valued functional activities. Baseline: to be measured at visit 2 as appropriate (02/12/24); 3.5/10 (02/17/2024);  Goal status: in progress  6.  Patient will report NPRS equal or less than 3/10 during functional activities during the last 2 weeks to improve their abilitly to complete community, work and/or recreational activities with less limitation. Baseline: up to 10/10 (02/12/24); Goal status: In progress   PLAN:  PT FREQUENCY: 2x/week  PT DURATION: 8-12 weeks  PLANNED INTERVENTIONS: 97164- PT Re-evaluation, 97750- Physical Performance Testing, 97110-Therapeutic exercises, 97530- Therapeutic activity, 97112- Neuromuscular re-education, 97535- Self Care, 02859- Manual therapy, [id number]- Electrical stimulation (unattended), 20560 (1-2 muscles), 20561 (3+  muscles)- Dry Needling, Patient/Family education, Balance training, Joint mobilization, Spinal mobilization, Cryotherapy, and Moist heat.  PLAN FOR NEXT SESSION: update HEP as appropriate, education on mechanical stressors and modifications of activities to avoid them, recover motor control and awareness of modifiers to mechanical sensitivities, retrain motor patterns, regain ROM, improve strength and resilience needed for  performing desired functional performance with appropriate ROM, strength, power, and endurance. Manual therapy as needed.   Camie SAUNDERS. Juli, PT, DPT, Cert. MDT 02/24/24, 8:16 PM  [facility] [address] [place], [place] [address] P: [phone] I F: [phone]

## 2024-02-26 ENCOUNTER — Ambulatory Visit: Admitting: Physical Therapy

## 2024-02-26 ENCOUNTER — Encounter: Payer: Self-pay | Admitting: Physical Therapy

## 2024-02-26 DIAGNOSIS — M5416 Radiculopathy, lumbar region: Secondary | ICD-10-CM

## 2024-02-26 DIAGNOSIS — R2689 Other abnormalities of gait and mobility: Secondary | ICD-10-CM | POA: Diagnosis not present

## 2024-02-26 DIAGNOSIS — M6281 Muscle weakness (generalized): Secondary | ICD-10-CM | POA: Diagnosis not present

## 2024-02-26 DIAGNOSIS — M25631 Stiffness of right wrist, not elsewhere classified: Secondary | ICD-10-CM | POA: Diagnosis not present

## 2024-02-26 DIAGNOSIS — R208 Other disturbances of skin sensation: Secondary | ICD-10-CM | POA: Diagnosis not present

## 2024-02-26 DIAGNOSIS — L905 Scar conditions and fibrosis of skin: Secondary | ICD-10-CM | POA: Diagnosis not present

## 2024-02-26 NOTE — Therapy (Signed)
 OUTPATIENT PHYSICAL THERAPY  TREATMENT   Patient Name: Tricia Hill MRN: [id number] DOB:10-18-1970, 53 y.o., female Today's Date: 02/26/2024  END OF SESSION:  PT End of Session - 02/26/24 1204     Visit Number 4    Number of Visits 17    Date for PT Re-Evaluation 05/06/24    Authorization Type [place] [employer] PPO reporting from 02/12/2024    Progress Note Due on Visit 10    PT Start Time 1118    PT Stop Time 1204    PT Time Calculation (min) 46 min    Activity Tolerance Patient limited by pain;Patient tolerated treatment well    Behavior During Therapy [facility] for tasks assessed/performed             Past Medical History:  Diagnosis Date   Allergy 2012   Anxiety    Arthritis    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   Asthma    Diabetes mellitus without complication (HCC) 2016   GERD (gastroesophageal reflux disease)    Heart murmur    child   History of kidney stones    Hypertension    Neuromuscular disorder (HCC)    right hands tingling and numbness   PONV (postoperative nausea and vomiting)    Shingles    Sleep apnea    USE CPAP   Past Surgical History:  Procedure Laterality Date   ANTERIOR CERVICAL DECOMP/DISCECTOMY FUSION N/A 04/13/2022   Procedure: Anterior Cervical Discectomy Fusion - Cervical seven-Thoracic one removal of plate;  Surgeon: Louis Shove, MD;  Location: [facility] OR;  Service: Neurosurgery;  Laterality: N/A;   Carpel Tunnel Surgery Right 10/01/2023   CESAREAN SECTION     X 2   CHOLECYSTECTOMY N/A 01/18/2017   Procedure: LAPAROSCOPIC CHOLECYSTECTOMY WITH INTRAOPERATIVE CHOLANGIOGRAM;  Surgeon: Dessa Reyes ORN, MD;  Location: [facility] ORS;  Service: General;  Laterality: N/A;   COLONOSCOPY WITH PROPOFOL  N/A 10/13/2020   Procedure: COLONOSCOPY WITH PROPOFOL ;  Surgeon: Unk Corinn Skiff, MD;  Location: [facility] ENDOSCOPY;  Service: Gastroenterology;  Laterality: N/A;  COVID POSITIVE 09/30/2020   CYSTOSCOPY/URETEROSCOPY/HOLMIUM LASER/STENT PLACEMENT Left  08/21/2022   Procedure: CYSTOSCOPY/URETEROSCOPY/HOLMIUM LASER/STENT PLACEMENT;  Surgeon: Twylla Glendia BROCKS, MD;  Location: [facility] ORS;  Service: Urology;  Laterality: Left;   DIAGNOSTIC LAPAROSCOPY  2006   EXCISION OF ABDOMINAL WALL ENDOMETRIOMA   EXTRACORPOREAL SHOCK WAVE LITHOTRIPSY Left 07/12/2022   Procedure: EXTRACORPOREAL SHOCK WAVE LITHOTRIPSY (ESWL);  Surgeon: Penne Knee, MD;  Location: [facility] ORS;  Service: Urology;  Laterality: Left;   SPINAL FUSION  2012   C3 and C4   TOTAL ABDOMINAL HYSTERECTOMY     Partial   Patient Active Problem List   Diagnosis Date Noted   Chronic anterior uveitis of left eye 02/05/2024   Chronic low back pain 12/03/2023   Disorder of right median nerve 12/03/2023   Lumbar radiculopathy 12/03/2023   Numbness of hand 12/03/2023   History of carpal tunnel surgery of right wrist 10/01/2023   Uveitis 07/11/2023   Numbness of upper limb 05/28/2023   Pain in right foot 05/28/2023   Cervical spondylosis with myelopathy and radiculopathy 04/13/2022   Osteoarthritis of spine with radiculopathy, cervical region 02/08/2022   Osteoarthritis of spine with radiculopathy, thoracic region 02/08/2022   Anxiety 09/05/2021   HTN (hypertension) 04/16/2018   Fatty liver 12/07/2016   Microscopic hematuria 05/26/2015   OSA (obstructive sleep apnea) 01/31/2015   S/P hysterectomy 01/31/2015   Controlled diabetes mellitus type 2 with complications (HCC) 01/31/2015   Morbid obesity (  HCC) 01/31/2015   Hyperlipidemia associated with type 2 diabetes mellitus (HCC) 01/31/2015    PCP: Vicci Duwaine SQUIBB, DO  REFERRING PROVIDER: Louis Shove, MD (neurosurgery)  REFERRING DIAG: lumbar radiculopathy  Rationale for Evaluation and Treatment: Rehabilitation  THERAPY DIAG:  Radiculopathy, lumbar region  Muscle weakness (generalized)  Other abnormalities of gait and mobility  ONSET DATE: for > 5 years but legs worse over the last few months, especially since carpal tunnel  surgery  SUBJECTIVE:                                                                                                                                                                                            PERTINENT HISTORY:  Patient is a 53 y.o. female who presents to outpatient physical therapy with a referral for medical diagnosis lumbar radiculopathy. This patient's chief complaints consist of low back and leg pain and leg weakness leading to the following functional deficits: difficulty with usual activities such as sitting, walking, bed mobility, transfers, bending, dressing, lifting, personal care, traveling, social life, working as a Engineer, civil (consulting), homemaking, sleeping, and leisure. Relevant past medical history and comorbidities include the following: she has OSA (obstructive sleep apnea); S/P hysterectomy; Controlled diabetes mellitus type 2 with complications (HCC); Morbid obesity (HCC); Hyperlipidemia associated with type 2 diabetes mellitus (HCC); Microscopic hematuria; Fatty liver; HTN (hypertension); Anxiety; Osteoarthritis of spine with radiculopathy, cervical region; Osteoarthritis of spine with radiculopathy, thoracic region; Cervical spondylosis with myelopathy and radiculopathy; Uveitis; Chronic low back pain; Disorder of right median nerve; History of carpal tunnel surgery of right wrist; Lumbar radiculopathy; Numbness of hand; Numbness of upper limb; Pain in right foot; and Chronic anterior uveitis of left eye on their problem list. she  has a past medical history of Allergy (2012), Anxiety, Arthritis, Asthma, Diabetes mellitus without complication (HCC) (2016), GERD (gastroesophageal reflux disease), Heart murmur, History of kidney stones, Hypertension, Neuromuscular disorder (HCC), PONV (postoperative nausea and vomiting), Shingles, and Sleep apnea. she  has a past surgical history that includes Cesarean section; Spinal fusion (2012); Total abdominal hysterectomy; Diagnostic laparoscopy  (2006); Cholecystectomy (N/A, 01/18/2017); Colonoscopy with propofol  (N/A, 10/13/2020); Anterior cervical decomp/discectomy fusion (N/A, 04/13/2022); Extracorporeal shock wave lithotripsy (Left, 07/12/2022); Cystoscopy/ureteroscopy/holmium laser/stent placement (Left, 08/21/2022); and Carpel Tunnel Surgery (Right, 10/01/2023). Patient denies hx of cancer, stroke, seizures, lung problems, and unexplained stumbling or dropping things. Drops things. Has had 2 neck surgeries but no low back surgeries.   She states she has urinary incontinence and this has worsened over time. She is always changing her underwear or wearing a pad. Usually it is when coughing and sneezing. Also has urgency and it  can start leaking before she gets to the bathroom. It is harder to hold the closer she gets to the toilet.   Exercise history: joined the gym in hopes to lose weight but cannot go because of low back pian. She wanted to do weight lifting.   She is terrified because her mom has similar back problems and cannot do anything now and has sort of given up. Her sister is also going through the same thing with her back. Patient's kids are getting married and she wants to be active and do things with her grandchildren.   SUBJECTIVE STATEMENT: Patient states she had increased pain yesterday in the central and right low back and lateral hip, and she still has pain there today but not as bad. The pain near the gluteal folds has been better. She did her HEP 2 times yesterday and once this morning. They are going not bad. She thinks she can raise her leg a little longer. The right one is still harder to hold than the left. Not causing any extra pain.   PAIN: NPRS: Current: 6/10 at central and right low back and lateral R hip  From initial PT evaluation on 02/12/24:  Best: 0/10, Worst: 10/10. Pain location: Across top of pelvis (was more on R but now more L). This is the starting area. It would ache and get better. Now it is  constantly there. She now has intermittent pain down the back of both thighs to the tops of both feet which can happen together or be one side or the other.  Pain description: Dull, occasionally sharp like a zap behind left knee Aggravating factors: Getting up from sitting, prolonged standing ( ), prolonged walking (10 min), sitting (legs hurt), standing legs and back hurt, sitting with legs propped up in front of her makes her legs hurt more, bending.  Relieving factors: injection partially, sitting can help her back (but makes her legs hurt)   PRECAUTIONS: No lifting of 10# or more due to R hand (recommended by OT so it doesn't damage her neck and arm until she gets more strength in her arm).   WEIGHT BEARING RESTRICTIONS: No  PATIENT GOALS:  I just want the pain to be more bearable. Be able to do some of the things I used to do. Walking for fitness with husband.   OBJECTIVE  TREATMENT                                                                                                                          Manual therapy: to reduce pain and tissue tension, improve range of motion, neuromodulation, in order to promote improved ability to complete functional activities.  Hooklying intermittent lumbar traction with belt around knees 10x10 seconds on/off   PRONE with folded pillow supporting lumbar spine and contralateral LE on floor with wedge under heel PROM psoas stretch 3x30 seconds each side with  2x 7 second isometric hip flexion contraction for contract-relax between PROM stretches  Self-Care/Home Management Training: to educate patient in self care including ADL training, meal preparation, compensatory training, safety procedures/instructions, use of assistive technology devices or adaptive equipment.  Education with trial/practice on how to modify bed mobility, sitting, standing, dish-washing, and sit<> stand transfers to decrease mechanical stressors that patient is sensitive to.    Neuromuscular Re-education: a technique or exercise performed with the goal of improving the level of communication between the body and the brain, such as for balance, motor control, muscle activation patterns, coordination, desensitization, quality of muscle contraction, proprioception, and/or kinesthetic sense needed for successful and safe completion of functional activities.   Prone (with folded pillow under abdomen) alternating hip extension with slight PPT, focused on stabilizing lumbar spine with TrA contraction and pelvic floor contraction, cuing thought of hip extension and moving into hip extension only as far as possible without pain provocation and while keeping lumbopelvic control locked in.  1x5 each side with 5 second hold Good carry over Some discomfort at right glute/low back with R lift so worked on modifying pelvic position with lsight tilt to decrease this. It was actually more painful with more tilt so discontinued the increased tilt.   Standing posterior pelvic tilt AROM against wall and in standing Spaced repetitions progressing to 5-10 second holds Patient able to perform well when leaning against wall by end of session but not in standing  Pt required multimodal cuing for proper technique and to facilitate improved neuromuscular control, strength, range of motion, and functional ability resulting in improved performance and form.  PATIENT EDUCATION:  Education details: Exercise purpose/form. Self management techniques. Education on diagnosis, prognosis, POC, anatomy and physiology of current condition. Education on HEP including handout. Person educated: Patient Education method: Explanation, Demonstration, and Verbal cues, handouts Education comprehension: verbalized understanding, returned demonstration, and needs further education  HOME EXERCISE PROGRAM: Access Code: [email or web address] URL: [email or web address] Date: 02/24/2024 Prepared by: Camie Cleverly  Exercises - Sitting to Supine Roll  - Prone Hip Extension with Pillow Under Abdomen  - 1 x daily - 3 sets - 5-10 reps - 5 second hold hold - Supine Pelvic Tilt  - 1 x daily - 1 sets - 20 reps  Avoid L shaped positions Lay down on back with knees flexed for 5-10 min periodically throughout day  ASSESSMENT:  CLINICAL IMPRESSION: Continued with education on modifying activities that are most bothersome to patient in her daily life to decrease irritation to mechanical stressors. Patient felt better with sit <> stand modification, and appeared to understand options for modifying standing, bed mobility, and sitting positions at work and at home. Continued with manual stretching of iliopsoas muscle since pt does not yet have the lumbopelvic motor control to perform an effective PPT during the stretch. Plan to re-attempt next session. She does not have any pain during the manual stretch, but reported increased pain in R low back and gluteal fold after stretch. Patient also has some pain in the right low back with prone hip extension exercise despite attempts to modify it. Plan to progress this exercise or try a different position next session as appropriate. She cannot tolerate quadruped due to her UE issues, so may try elbows to knees or kneeling on bench while resting upper body over plinth (but that will be hard to replicate at home). May also explore bridge variations. Patient would benefit from continued management of limiting condition by skilled physical therapist to address remaining impairments and functional limitations to work towards stated goals and return to  PLOF or maximal functional independence.    Mechanical sensitivities: load, nerve tension (L > R), extension (possibly multidirectional but other motions only tested under load).    From initial PT evaluation on 02/12/2024:  Patient is a 53 y.o. female referred to outpatient physical therapy with a medical diagnosis of lumbar  radiculopathy who presents with signs and symptoms consistent with low back pain with radiation to bilateral LE with mechanical sensitivities to nerve tension, compressive load, and possibly multidirectional ROM. Also likely sensitive to shear forces at the lumbar spine. Patient presents with significant pain, ROM, muscle performance (strength/power/endurance), balance, motor control, knowledge, nerve tension, gait, and activity tolerance impairments that are limiting ability to complete usual activities such as sitting, walking, bed mobility, transfers, bending, dressing, lifting, personal care, traveling, social life, working as a Engineer, civil (consulting), homemaking, sleeping, and leisure without difficulty. Patient will benefit from skilled physical therapy intervention to address current body structure impairments and activity limitations to improve function and work towards goals set in current POC in order to return to prior level of function or maximal functional improvement.      OBJECTIVE IMPAIRMENTS: Abnormal gait, decreased activity tolerance, decreased balance, decreased coordination, decreased endurance, decreased knowledge of condition, decreased mobility, difficulty walking, decreased ROM, decreased strength, increased muscle spasms, impaired flexibility, improper body mechanics, obesity, and pain.   ACTIVITY LIMITATIONS: carrying, lifting, bending, sitting, standing, squatting, sleeping, stairs, transfers, bed mobility, bathing, dressing, locomotion level, and caring for others  PARTICIPATION LIMITATIONS: meal prep, cleaning, laundry, interpersonal relationship, driving, shopping, community activity, occupation, yard work, and church  PERSONAL FACTORS: Fitness, Past/current experiences, Time since onset of injury/illness/exacerbation, and 3+ comorbidities:   OSA (obstructive sleep apnea); S/P hysterectomy; Controlled diabetes mellitus type 2 with complications (HCC); Morbid obesity (HCC); Hyperlipidemia  associated with type 2 diabetes mellitus (HCC); Microscopic hematuria; Fatty liver; HTN (hypertension); Anxiety; Osteoarthritis of spine with radiculopathy, cervical region; Osteoarthritis of spine with radiculopathy, thoracic region; Cervical spondylosis with myelopathy and radiculopathy; Uveitis; Chronic low back pain; Disorder of right median nerve; History of carpal tunnel surgery of right wrist; Lumbar radiculopathy; Numbness of hand; Numbness of upper limb; Pain in right foot; and Chronic anterior uveitis of left eye on their problem list. she  has a past medical history of Allergy (2012), Anxiety, Arthritis, Asthma, Diabetes mellitus without complication (HCC) (2016), GERD (gastroesophageal reflux disease), Heart murmur, History of kidney stones, Hypertension, Neuromuscular disorder (HCC), PONV (postoperative nausea and vomiting), Shingles, and Sleep apnea. she  has a past surgical history that includes Cesarean section; Spinal fusion (2012); Total abdominal hysterectomy; Diagnostic laparoscopy (2006); Cholecystectomy (N/A, 01/18/2017); Colonoscopy with propofol  (N/A, 10/13/2020); Anterior cervical decomp/discectomy fusion (N/A, 04/13/2022); Extracorporeal shock wave lithotripsy (Left, 07/12/2022); Cystoscopy/ureteroscopy/holmium laser/stent placement (Left, 08/21/2022); and Carpel Tunnel Surgery (Right, 10/01/2023). are also affecting patient's functional outcome.   REHAB POTENTIAL: Good  CLINICAL DECISION MAKING: Evolving/moderate complexity  EVALUATION COMPLEXITY: Moderate   GOALS: Goals reviewed with patient? Yes  SHORT TERM GOALS: Target date: 02/26/2024  Patient will be independent with initial home exercise program for self-management of symptoms. Baseline: Initial HEP provided at IE (02/12/24); Goal status: In progress   LONG TERM GOALS: Target date: 05/06/2024  Patient will be independent with a long-term home exercise program for self-management of symptoms.  Baseline: Initial  HEP provided at IE (02/12/24); Goal status: In progress  2.  Patient will demonstrate improved Modified Oswestry Disability Index (mODI) to equal or less than 10% to demonstrate improvement in overall condition and self-reported functional ability.  Baseline:  56% (02/12/24); Goal status: In progress  3.  Patient will demonstrate the ability to perform sit <> stand and bed mobility without increased pain and without pain behaviars to improve her ability to work and sleep.  Baseline: painful and altered movement pattern (02/12/24); Goal status: In progress  4.  Patient will demonstrate 10 rep heel raise equal bilaterally to demonstrate improved LE function for working, ADLs, and IADLs.  Baseline: unable to do heel raises on R side but able on L (02/12/24); Goal status: In progress  5.  Patient will demonstrate improvement in Patient Specific Functional Scale (PSFS) of equal or greater than 8/10 points to reflect clinically significant improvement in patient's most valued functional activities. Baseline: to be measured at visit 2 as appropriate (02/12/24); 3.5/10 (02/17/2024);  Goal status: in progress  6.  Patient will report NPRS equal or less than 3/10 during functional activities during the last 2 weeks to improve their abilitly to complete community, work and/or recreational activities with less limitation. Baseline: up to 10/10 (02/12/24); Goal status: In progress   PLAN:  PT FREQUENCY: 2x/week  PT DURATION: 8-12 weeks  PLANNED INTERVENTIONS: 97164- PT Re-evaluation, 97750- Physical Performance Testing, 97110-Therapeutic exercises, 97530- Therapeutic activity, 97112- Neuromuscular re-education, 97535- Self Care, 02859- Manual therapy, [id number]- Electrical stimulation (unattended), 20560 (1-2 muscles), 20561 (3+ muscles)- Dry Needling, Patient/Family education, Balance training, Joint mobilization, Spinal mobilization, Cryotherapy, and Moist heat.  PLAN FOR NEXT SESSION: update HEP as  appropriate, education on mechanical stressors and modifications of activities to avoid them, recover motor control and awareness of modifiers to mechanical sensitivities, retrain motor patterns, regain ROM, improve strength and resilience needed for  performing desired functional performance with appropriate ROM, strength, power, and endurance. Manual therapy as needed.   Camie SAUNDERS. Juli, PT, DPT, Cert. MDT 02/26/24, 12:35 PM  [facility] [address] [place], [place] [address] P: [phone] I F: [phone]

## 2024-03-02 ENCOUNTER — Encounter: Admitting: Physical Therapy

## 2024-03-02 DIAGNOSIS — H3023 Posterior cyclitis, bilateral: Secondary | ICD-10-CM | POA: Diagnosis not present

## 2024-03-02 DIAGNOSIS — H35351 Cystoid macular degeneration, right eye: Secondary | ICD-10-CM | POA: Diagnosis not present

## 2024-03-02 DIAGNOSIS — E119 Type 2 diabetes mellitus without complications: Secondary | ICD-10-CM | POA: Diagnosis not present

## 2024-03-03 ENCOUNTER — Encounter: Payer: Self-pay | Admitting: Physical Therapy

## 2024-03-03 ENCOUNTER — Ambulatory Visit: Admitting: Occupational Therapy

## 2024-03-03 ENCOUNTER — Ambulatory Visit: Admitting: Physical Therapy

## 2024-03-03 DIAGNOSIS — M6281 Muscle weakness (generalized): Secondary | ICD-10-CM | POA: Diagnosis not present

## 2024-03-03 DIAGNOSIS — R2689 Other abnormalities of gait and mobility: Secondary | ICD-10-CM

## 2024-03-03 DIAGNOSIS — M5416 Radiculopathy, lumbar region: Secondary | ICD-10-CM

## 2024-03-03 DIAGNOSIS — M25631 Stiffness of right wrist, not elsewhere classified: Secondary | ICD-10-CM

## 2024-03-03 DIAGNOSIS — L905 Scar conditions and fibrosis of skin: Secondary | ICD-10-CM

## 2024-03-03 DIAGNOSIS — R208 Other disturbances of skin sensation: Secondary | ICD-10-CM

## 2024-03-03 NOTE — Therapy (Signed)
 OUTPATIENT OCCUPATIONAL THERAPY ORTHO TREATMENT  Patient Name: Tricia Hill MRN: [id number] DOB:July 04, 1970, 53 y.o., female Today's Date: 03/03/2024  PCP: Dr Vicci MART PROVIDER: Dr Louis  END OF SESSION:  OT End of Session - 03/03/24 1207     Visit Number 5    Number of Visits 12    Date for Recertification  03/24/24    OT Start Time 1205    Activity Tolerance Patient tolerated treatment well    Behavior During Therapy [facility] for tasks assessed/performed          Past Medical History:  Diagnosis Date   Allergy 2012   Anxiety    Arthritis    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   Asthma    Diabetes mellitus without complication (HCC) 2016   GERD (gastroesophageal reflux disease)    Heart murmur    child   History of kidney stones    Hypertension    Neuromuscular disorder (HCC)    right hands tingling and numbness   PONV (postoperative nausea and vomiting)    Shingles    Sleep apnea    USE CPAP   Past Surgical History:  Procedure Laterality Date   ANTERIOR CERVICAL DECOMP/DISCECTOMY FUSION N/A 04/13/2022   Procedure: Anterior Cervical Discectomy Fusion - Cervical seven-Thoracic one removal of plate;  Surgeon: Louis Shove, MD;  Location: [facility] OR;  Service: Neurosurgery;  Laterality: N/A;   Carpel Tunnel Surgery Right 10/01/2023   CESAREAN SECTION     X 2   CHOLECYSTECTOMY N/A 01/18/2017   Procedure: LAPAROSCOPIC CHOLECYSTECTOMY WITH INTRAOPERATIVE CHOLANGIOGRAM;  Surgeon: Dessa Reyes ORN, MD;  Location: [facility] ORS;  Service: General;  Laterality: N/A;   COLONOSCOPY WITH PROPOFOL  N/A 10/13/2020   Procedure: COLONOSCOPY WITH PROPOFOL ;  Surgeon: Unk Corinn Skiff, MD;  Location: [facility] ENDOSCOPY;  Service: Gastroenterology;  Laterality: N/A;  COVID POSITIVE 09/30/2020   CYSTOSCOPY/URETEROSCOPY/HOLMIUM LASER/STENT PLACEMENT Left 08/21/2022   Procedure: CYSTOSCOPY/URETEROSCOPY/HOLMIUM LASER/STENT PLACEMENT;  Surgeon: Twylla Glendia BROCKS, MD;  Location: [facility] ORS;   Service: Urology;  Laterality: Left;   DIAGNOSTIC LAPAROSCOPY  2006   EXCISION OF ABDOMINAL WALL ENDOMETRIOMA   EXTRACORPOREAL SHOCK WAVE LITHOTRIPSY Left 07/12/2022   Procedure: EXTRACORPOREAL SHOCK WAVE LITHOTRIPSY (ESWL);  Surgeon: Penne Knee, MD;  Location: [facility] ORS;  Service: Urology;  Laterality: Left;   SPINAL FUSION  2012   C3 and C4   TOTAL ABDOMINAL HYSTERECTOMY     Partial   Patient Active Problem List   Diagnosis Date Noted   Chronic anterior uveitis of left eye 02/05/2024   Chronic low back pain 12/03/2023   Disorder of right median nerve 12/03/2023   Lumbar radiculopathy 12/03/2023   Numbness of hand 12/03/2023   History of carpal tunnel surgery of right wrist 10/01/2023   Uveitis 07/11/2023   Numbness of upper limb 05/28/2023   Pain in right foot 05/28/2023   Cervical spondylosis with myelopathy and radiculopathy 04/13/2022   Osteoarthritis of spine with radiculopathy, cervical region 02/08/2022   Osteoarthritis of spine with radiculopathy, thoracic region 02/08/2022   Anxiety 09/05/2021   HTN (hypertension) 04/16/2018   Fatty liver 12/07/2016   Microscopic hematuria 05/26/2015   OSA (obstructive sleep apnea) 01/31/2015   S/P hysterectomy 01/31/2015   Controlled diabetes mellitus type 2 with complications (HCC) 01/31/2015   Morbid obesity (HCC) 01/31/2015   Hyperlipidemia associated with type 2 diabetes mellitus (HCC) 01/31/2015    ONSET DATE: 10/01/23  REFERRING DIAG: R CTR  THERAPY DIAG:  Radiculopathy, lumbar region  Muscle weakness (generalized)  Scar tissue  Other disturbances of skin sensation  Stiffness of right wrist, not elsewhere classified  Rationale for Evaluation and Treatment: Rehabilitation  SUBJECTIVE:   SUBJECTIVE STATEMENT: I went back to modified duty earlier this month.  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  And then helping the nurses with medicine and staff.  I did notice my side of my  hand on the pinky side hurts more at times and unlike scooping food for the dog and on a rotating of some pain in my thumb into my wrist.  He just feels like my middle finger when I am doing pinching is not strong.  But I can tell the difference that I am stronger my hand overall.  PERTINENT HISTORY: DR Louis note: The patient returns today in follow-up. She continues to have some right him discomfort as she recover some carpal tunnel surgery. She has not started her physical therapy. She also notes persistent posterior cervical pain with some radiation. She is having no definite weakness. Her lumbar pain is stable. She did not get much improvement following recent epidural steroid injection   PRECAUTIONS: None    WEIGHT BEARING RESTRICTIONS: No  PAIN:  Are you having pain? 2/10 dorsal thumb as well as hypothenar eminence  FALLS: Has patient fallen in last 6 months? 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  But not at the moment on medical leave.  Does house stuff around with cooking and cleaning and laundry.  On her phone.  Reads some.  Has 2 grownup kids and a 86 -year-old-does a little flowers  PATIENT GOALS: I just want the numbness better and the pain but then also more strength so I can do my job as a Engineer, civil (consulting)  NEXT MD VISIT: 21 August  OBJECTIVE:  Note: Objective measures were completed at Evaluation unless otherwise noted.  HAND DOMINANCE: Right  ADLs: Difficulty with cutting food, cooking doing laundry.  Putting on jewelry, do buttons, opening jars or packages or Ziploc bags: Cannot grip or hold things I drop things  FUNCTIONAL OUTCOME MEASURES: To be done excision  UPPER EXTREMITY ROM:     Active ROM Right eval Left eval  Shoulder flexion    Shoulder abduction    Shoulder adduction    Shoulder extension    Shoulder internal rotation    Shoulder external rotation    Elbow flexion    Elbow  extension    Wrist flexion 70   Wrist extension 64   Wrist ulnar deviation    Wrist radial deviation    Wrist pronation    Wrist supination    (Blank rows = not tested)  Active ROM Right eval Left eval  Thumb MCP (0-60)    Thumb IP (0-80)    Thumb Radial abd/add (0-55)     Thumb Palmar abd/add (0-45)     Thumb Opposition to Small Finger     Index MCP (0-90)     Index PIP (0-100)     Index DIP (0-70)      Long MCP (0-90)      Long PIP (0-100)      Long DIP (0-70)      Ring MCP (0-90)      Ring PIP (0-100)      Ring DIP (0-70)      Little MCP (0-90)      Little PIP (0-100)  Little DIP (0-70)      Digit flexion within normal limits but tight when attempting to make a fist. Opposition within normal limits. Thumb palmar radial abduction within normal limits  HAND FUNCTION: Grip strength: Right: 27 lbs; Left: 50 lbs, Lateral pinch: Right: 4 lbs, Left: 12 lbs, and 3 point pinch: Right: 6 lbs, Left: 14 lbs 02/06/24: Grip strength: Right: 29 lbs; Left: 50 lbs, Lateral pinch: Right: 4 lbs, Left: 12 lbs, and 3 point pinch: Right: 6 lbs, Left: 14 lbs  02/11/24: Grip strength: Right: 42 lbs; Left: 50 lbs, Lateral pinch: Right: 5 lbs, Left: 12 lbs, and 3 point pinch: Right: 7 lbs, Left: 14 lbs 03/03/24: Grip strength: Right: 44 lbs; Left: 50 lbs, Lateral pinch: Right: 6 lbs, Left: 12 lbs, and 3 point pinch: Right: 7 lbs, Left: 14 lbs COORDINATION: Decrease in limited because of numbness in digits at times  SENSATION: Patient report increased numbness from thumb through fourth digit on and off during the day and night  EDEMA: Not noticeable.  COGNITION: Overall cognitive status: Within functional limits for tasks assessed      TREATMENT DATE: 03/03/24                                                                                                                             Patient return after not being seen for about 2 to 3 weeks.  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   Doing a lot of writing and on the computer.  Helping the nurses with medication. Reports some discomfort and pain over the dorsal thumb as well as wrist with and scooping dog food in combination with pronation supination and lateral pinch Also some pain at the hypothenar eminence.  Upon assessment patient's grip improved but lateral 3-point pinch maintained and actually decreased a pound. Patient also with increased wrist flexion extension and ulnar deviation. But decreased radial deviation strength as well as supination pronation Upon assessment when patient does a grip or lateral pinch patient ulnar deviate forearm unable to maintain midline.   Modalities: Paraffin:  Time: 8 Location right hand, wrist and forearm Decrease scar tissue as well as pain and stiffness prior to manual therapy and scar massage   Soft tissue mobilization done with Graston tool #2 over right volar forearm as well as palm.  I  Reviewed with patient scar mobilization again.  Ongoing Cica -Care scar pad use at nighttime. Scar mobilization done with gentle brushing strokes of Graston tool #2.  Tolerated mini massager as well as MC and Carpal spreads x12 reps.  Reports family assisting with scar massage at home and foam roller on palm and volar forearm prior to range of motion - reviewed technique. Patient scar still tender and thick.  Recommend to continue  Continue with: tendon glides 12 reps Appear patient was not doing any strengthening for palmar radial abduction. -Restart patient to do rubber band for resistance for palmar radial abduction 3  sets of 12 2 times a day.  Patient have to focus to maintain position of palmar abduction Also added an upgrade for to 2 pound weight for radial deviation on the armrest from neutral 12-15 reps 2 times a day And 2 pound weight for supination and pronation 15 reps 2 times a day maintaining third metacarpal in line with middle line of wrist with supination  Upgrade patient  to green medium firm putty   -12 to 15 reps each: gripping ,  key pinch and 3 pt pinch. - can roll inbetween if feel some tightness or spasm   - Patient to maintain wrist in neutral with key pinch as well as lift putty on table with 3-point pinch maintaining third MCP and midline of wrist Quality more than quantity Plan to complete HEP 2-3 times a day after contrast bath Patient to make a list of things that brings on numbness during the day.       PATIENT EDUCATION: Education details: findings of eval and HEP  Person educated: Patient Education method: Explanation, Demonstration, Tactile cues, Verbal cues, and Handouts Education comprehension: verbalized understanding, returned demonstration, verbal cues required, and needs further education   GOALS: Goals reviewed with patient? Yes LONG TERM GOALS: Target date: 8 weeks  Patient to be independent in home program to decrease scar tissue and tenderness to report decrease stiffness tightness and digit range of motion as well as individually tendon glides Baseline: Increased tightness and stiffness in digit flexion.  Scar thick and adhere as well as tender and pain when hitting no knowledge of scar mobilization and massage Goal status: INITIAL  2.  Wrist active range of motion improved to within normal limits symptom-free today able to push and pull heavy door. Baseline: Patient report pain and tenderness over carpal tunnel scar with pressure or hitting it as well as decreased wrist flexion extension unable to push or pull with hand has increased symptoms in the wrist and forearm Goal status: INITIAL  3.  Grip and prehension strength in the right improved to within normal range for her age for patient to be able to turn doorknob, cut with a knife, open Ziploc bags and do buttons symptom-free Baseline: Right grip 27 pounds left 50 pounds, lateral pinch right 4 pounds left 12 pounds and 3 point pinch right 6 pounds left 14 pounds Goal  status: INITIAL  4.  Patient function on PRWHE improved with more than 10 points in ADLs and IADLs Baseline:  PRWHE score to be done next session Goal status: INITIAL   ASSESSMENT:  CLINICAL IMPRESSION: Continues to wear brace at nighttime, reports mild numbness to R 1-3 digits and pain with driving. Patient present at OT evaluation with increased scar tissue with increased tenderness and pain in palm to volar forearm with use.  Patient made great progress from start of care and wrist range of motion and strength as well as grip and pinch strength.  Patient was not seen for about 2 to 3 weeks.  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  Writing and on the computer as well as distributing medication.  Upon assessment patient still continues to have decreased radial deviation and supination pronation.  Unable to maintain midline with gripping.  Patient's grip improved but prehension did not improve actually decreased with a pound.  Patient compensating with ulnar side of hand.  Scar needs to continue to do scar mobilization.  Change home program to focus on 3  point and lateral pinch in combination with grip maintaining wrist neutral adding also and upgrade 2 pound weight focusing on radial deviation and supination and pronation.  As well as reinforced importance of strengthening palmar radial abduction.  Patient can benefit from skilled OT services to decrease scar tissue, pain and stiffness and increased motion and strength to return to prior level of function.  Patient did had a MRI to her cervical spine and is meeting with Dr. Louis this Thursday to review results.  PERFORMANCE DEFICITS: in functional skills including ADLs, IADLs, ROM, strength, pain, flexibility, decreased knowledge of use of DME, and UE functional use,   and psychosocial skills including environmental adaptation and routines and behaviors.   IMPAIRMENTS: are limiting patient from ADLs, IADLs,  rest and sleep, play, leisure, and social participation.   COMORBIDITIES: has no other co-morbidities that affects occupational performance. Patient will benefit from skilled OT to address above impairments and improve overall function.  MODIFICATION OR ASSISTANCE TO COMPLETE EVALUATION: No modification of tasks or assist necessary to complete an evaluation.  OT OCCUPATIONAL PROFILE AND HISTORY: Problem focused assessment: Including review of records relating to presenting problem.  CLINICAL DECISION MAKING: LOW - limited treatment options, no task modification necessary  REHAB POTENTIAL: Good for goals  EVALUATION COMPLEXITY: Low      PLAN:  OT FREQUENCY: 1-2x/week  OT DURATION: 6 weeks  PLANNED INTERVENTIONS: 97168 OT Re-evaluation, 97535 self care/ADL training, 02889 therapeutic exercise, 97530 therapeutic activity, 97112 neuromuscular re-education, 97140 manual therapy, 97035 ultrasound, 97018 paraffin, 02960 fluidotherapy, 97034 contrast bath, scar mobilization, passive range of motion, patient/family education, and DME and/or AE instructions    CONSULTED AND AGREED WITH PLAN OF CARE: Patient    Ancel Peters, OTR/L,CLT 03/03/2024, 12:08 PM

## 2024-03-03 NOTE — Therapy (Signed)
 OUTPATIENT PHYSICAL THERAPY  TREATMENT   Patient Name: Tricia Hill MRN: [id number] DOB:Oct 19, 1970, 53 y.o., female Today's Date: 03/03/2024  END OF SESSION:  PT End of Session - 03/03/24 1116     Visit Number 5    Number of Visits 17    Date for Recertification  05/06/24    Authorization Type [place] [employer] PPO reporting from 02/12/2024    Progress Note Due on Visit 10    PT Start Time 1116    PT Stop Time 1204    PT Time Calculation (min) 48 min    Activity Tolerance Patient limited by pain;Patient tolerated treatment well    Behavior During Therapy [facility] for tasks assessed/performed              Past Medical History:  Diagnosis Date   Allergy 2012   Anxiety    Arthritis    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   Asthma    Diabetes mellitus without complication (HCC) 2016   GERD (gastroesophageal reflux disease)    Heart murmur    child   History of kidney stones    Hypertension    Neuromuscular disorder (HCC)    right hands tingling and numbness   PONV (postoperative nausea and vomiting)    Shingles    Sleep apnea    USE CPAP   Past Surgical History:  Procedure Laterality Date   ANTERIOR CERVICAL DECOMP/DISCECTOMY FUSION N/A 04/13/2022   Procedure: Anterior Cervical Discectomy Fusion - Cervical seven-Thoracic one removal of plate;  Surgeon: Louis Shove, MD;  Location: [facility] OR;  Service: Neurosurgery;  Laterality: N/A;   Carpel Tunnel Surgery Right 10/01/2023   CESAREAN SECTION     X 2   CHOLECYSTECTOMY N/A 01/18/2017   Procedure: LAPAROSCOPIC CHOLECYSTECTOMY WITH INTRAOPERATIVE CHOLANGIOGRAM;  Surgeon: Dessa Reyes ORN, MD;  Location: [facility] ORS;  Service: General;  Laterality: N/A;   COLONOSCOPY WITH PROPOFOL  N/A 10/13/2020   Procedure: COLONOSCOPY WITH PROPOFOL ;  Surgeon: Unk Corinn Skiff, MD;  Location: [facility] ENDOSCOPY;  Service: Gastroenterology;  Laterality: N/A;  COVID POSITIVE 09/30/2020   CYSTOSCOPY/URETEROSCOPY/HOLMIUM LASER/STENT PLACEMENT Left  08/21/2022   Procedure: CYSTOSCOPY/URETEROSCOPY/HOLMIUM LASER/STENT PLACEMENT;  Surgeon: Twylla Glendia BROCKS, MD;  Location: [facility] ORS;  Service: Urology;  Laterality: Left;   DIAGNOSTIC LAPAROSCOPY  2006   EXCISION OF ABDOMINAL WALL ENDOMETRIOMA   EXTRACORPOREAL SHOCK WAVE LITHOTRIPSY Left 07/12/2022   Procedure: EXTRACORPOREAL SHOCK WAVE LITHOTRIPSY (ESWL);  Surgeon: Penne Knee, MD;  Location: [facility] ORS;  Service: Urology;  Laterality: Left;   SPINAL FUSION  2012   C3 and C4   TOTAL ABDOMINAL HYSTERECTOMY     Partial   Patient Active Problem List   Diagnosis Date Noted   Chronic anterior uveitis of left eye 02/05/2024   Chronic low back pain 12/03/2023   Disorder of right median nerve 12/03/2023   Lumbar radiculopathy 12/03/2023   Numbness of hand 12/03/2023   History of carpal tunnel surgery of right wrist 10/01/2023   Uveitis 07/11/2023   Numbness of upper limb 05/28/2023   Pain in right foot 05/28/2023   Cervical spondylosis with myelopathy and radiculopathy 04/13/2022   Osteoarthritis of spine with radiculopathy, cervical region 02/08/2022   Osteoarthritis of spine with radiculopathy, thoracic region 02/08/2022   Anxiety 09/05/2021   HTN (hypertension) 04/16/2018   Fatty liver 12/07/2016   Microscopic hematuria 05/26/2015   OSA (obstructive sleep apnea) 01/31/2015   S/P hysterectomy 01/31/2015   Controlled diabetes mellitus type 2 with complications (HCC) 01/31/2015   Morbid  obesity (HCC) 01/31/2015   Hyperlipidemia associated with type 2 diabetes mellitus (HCC) 01/31/2015    PCP: Vicci Duwaine SQUIBB, DO  REFERRING PROVIDER: Louis Shove, MD (neurosurgery)  REFERRING DIAG: lumbar radiculopathy  Rationale for Evaluation and Treatment: Rehabilitation  THERAPY DIAG:  Radiculopathy, lumbar region  Muscle weakness (generalized)  Other abnormalities of gait and mobility  ONSET DATE: for > 5 years but legs worse over the last few months, especially since carpal tunnel  surgery  SUBJECTIVE:                                                                                                                                                                                            PERTINENT HISTORY:  Patient is a 53 y.o. female who presents to outpatient physical therapy with a referral for medical diagnosis lumbar radiculopathy. This patient's chief complaints consist of low back and leg pain and leg weakness leading to the following functional deficits: difficulty with usual activities such as sitting, walking, bed mobility, transfers, bending, dressing, lifting, personal care, traveling, social life, working as a Engineer, civil (consulting), homemaking, sleeping, and leisure. Relevant past medical history and comorbidities include the following: she has OSA (obstructive sleep apnea); S/P hysterectomy; Controlled diabetes mellitus type 2 with complications (HCC); Morbid obesity (HCC); Hyperlipidemia associated with type 2 diabetes mellitus (HCC); Microscopic hematuria; Fatty liver; HTN (hypertension); Anxiety; Osteoarthritis of spine with radiculopathy, cervical region; Osteoarthritis of spine with radiculopathy, thoracic region; Cervical spondylosis with myelopathy and radiculopathy; Uveitis; Chronic low back pain; Disorder of right median nerve; History of carpal tunnel surgery of right wrist; Lumbar radiculopathy; Numbness of hand; Numbness of upper limb; Pain in right foot; and Chronic anterior uveitis of left eye on their problem list. she  has a past medical history of Allergy (2012), Anxiety, Arthritis, Asthma, Diabetes mellitus without complication (HCC) (2016), GERD (gastroesophageal reflux disease), Heart murmur, History of kidney stones, Hypertension, Neuromuscular disorder (HCC), PONV (postoperative nausea and vomiting), Shingles, and Sleep apnea. she  has a past surgical history that includes Cesarean section; Spinal fusion (2012); Total abdominal hysterectomy; Diagnostic laparoscopy  (2006); Cholecystectomy (N/A, 01/18/2017); Colonoscopy with propofol  (N/A, 10/13/2020); Anterior cervical decomp/discectomy fusion (N/A, 04/13/2022); Extracorporeal shock wave lithotripsy (Left, 07/12/2022); Cystoscopy/ureteroscopy/holmium laser/stent placement (Left, 08/21/2022); and Carpel Tunnel Surgery (Right, 10/01/2023). Patient denies hx of cancer, stroke, seizures, lung problems, and unexplained stumbling or dropping things. Drops things. Has had 2 neck surgeries but no low back surgeries.   She states she has urinary incontinence and this has worsened over time. She is always changing her underwear or wearing a pad. Usually it is when coughing and sneezing. Also has urgency and  it can start leaking before she gets to the bathroom. It is harder to hold the closer she gets to the toilet.   Exercise history: joined the gym in hopes to lose weight but cannot go because of low back pian. She wanted to do weight lifting.   She is terrified because her mom has similar back problems and cannot do anything now and has sort of given up. Her sister is also going through the same thing with her back. Patient's kids are getting married and she wants to be active and do things with her grandchildren.   SUBJECTIVE STATEMENT: Patient states her back and hips and legs were really bothering her over the weekend. She states she could not get comfortable on Saturday night (worked Friday and Sunday night). The region just distal to her her gluteal fold hurts all the time. She is also still getting the pain to the tops of her feet. She worked a lot on trying to sit at the edge of things and put her feet back, but she feels like it makes her knees ache. She states when she gets done with PT exercises she has a terrible headache. This happens at home and in the clinic. It lasts 30 minutes. She thinks it is possible she is holding her breath but she is not thinking about it. She currently has a slight headache. She had an  injection in her right eye and it leaked some so they had to put a contact in, which she will get removed on Friday. She was not given any precautions.   PAIN: NPRS: Current: 8/10 at central low back to posterior thighs and right groin region. 4/10 across the frontal region.   From initial PT evaluation on 02/12/24:  Best: 0/10, Worst: 10/10. Pain location: Across top of pelvis (was more on R but now more L). This is the starting area. It would ache and get better. Now it is constantly there. She now has intermittent pain down the back of both thighs to the tops of both feet which can happen together or be one side or the other.  Pain description: Dull, occasionally sharp like a zap behind left knee Aggravating factors: Getting up from sitting, prolonged standing ( ), prolonged walking (10 min), sitting (legs hurt), standing legs and back hurt, sitting with legs propped up in front of her makes her legs hurt more, bending.  Relieving factors: injection partially, sitting can help her back (but makes her legs hurt)   PRECAUTIONS: No lifting of 10# or more due to R hand (recommended by OT so it doesn't damage her neck and arm until she gets more strength in her arm).   WEIGHT BEARING RESTRICTIONS: No  PATIENT GOALS:  I just want the pain to be more bearable. Be able to do some of the things I used to do. Walking for fitness with husband.   OBJECTIVE  TREATMENT  Manual therapy: to reduce pain and tissue tension, improve range of motion, neuromodulation, in order to promote improved ability to complete functional activities.  Hooklying intermittent lumbar traction with belt around knees 5 min x10 seconds on/off  Increased pain in lateral hips from glutes  Sidelying  STM to QL and lumbar paraspinals bilaterally (very tender to QL R > L, with palpable relaxation with  sustained hold).   Neuromuscular Re-education: a technique or exercise performed with the goal of improving the level of communication between the body and the brain, such as for balance, motor control, muscle activation patterns, coordination, desensitization, quality of muscle contraction, proprioception, and/or kinesthetic sense needed for successful and safe completion of functional activities.   Half hooklying PPT/TrA/pelvic floor contraction with isometric hip extension with knee extended, focusing on coordinated movement to activate the lumbar multifidi for improved segmental control 2x5 each side with 5 seconds and ankle over folded over pillow Added to HEP  Therapeutic exercise: therapeutic exercises that incorporate ONE parameter at one or more areas of the body to centralize symptoms, develop strength and endurance, range of motion, and flexibility required for successful completion of functional activities. Seated lumbar fleixon roll out 2 min forwards with self selected pace  Pt required multimodal cuing for proper technique and to facilitate improved neuromuscular control, strength, range of motion, and functional ability resulting in improved performance and form.  PATIENT EDUCATION:  Education details: Exercise purpose/form. Self management techniques. Education on diagnosis, prognosis, POC, anatomy and physiology of current condition. Education on HEP including handout. Person educated: Patient Education method: Explanation, Demonstration, and Verbal cues, handouts Education comprehension: verbalized understanding, returned demonstration, and needs further education  HOME EXERCISE PROGRAM: Access Code: [email or web address] URL: [email or web address] Date: 02/24/2024 Prepared by: Camie Cleverly  Exercises - Sitting to Supine Roll  - Prone Hip Extension with Pillow Under Abdomen  - 1 x daily - 3 sets - 5-10 reps - 5 second hold hold - Supine Pelvic Tilt  - 1 x daily - 1  sets - 20 reps  Avoid L shaped positions Lay down on back with knees flexed for 5-10 min periodically throughout day  ASSESSMENT:  CLINICAL IMPRESSION: Patient with continued high levels of pain in her low back and legs as well as a recent leak in her eye after an injection there and report of headaches after lying HEP. Today's session focused on pain control and gentle neuromuscular reeducation and stretching for the low back region. Patient seemed to tolerate half hooklying position better than prone and did not feel worse after this multifidus exercise. She is showing good ability to perform PPT in hooklyng and half hooklying. She had very tender and tight QL bilaterally that softened some to sustained pressure during manual therapy. She felt a little better after manual therapy. Plan to continue with activity modifications, interventions to calm things down, and tolerated progressive exercises for improved motor control and lumbar support and mobility as tolerated. No increase in headache today during or after interventions. Pt to track if she gets one later on (she usually notices it a bit after sitting back up). She is also planning to monitor her breathing to ensure she is not holding her breath during exercises. Patient would benefit from continued management of limiting condition by skilled physical therapist to address remaining impairments and functional limitations to work towards stated goals and return to PLOF or maximal functional independence.   Mechanical sensitivities: load, nerve tension (L > R), extension (possibly multidirectional but other motions  only tested under load).    From initial PT evaluation on 02/12/2024:  Patient is a 53 y.o. female referred to outpatient physical therapy with a medical diagnosis of lumbar radiculopathy who presents with signs and symptoms consistent with low back pain with radiation to bilateral LE with mechanical sensitivities to nerve tension,  compressive load, and possibly multidirectional ROM. Also likely sensitive to shear forces at the lumbar spine. Patient presents with significant pain, ROM, muscle performance (strength/power/endurance), balance, motor control, knowledge, nerve tension, gait, and activity tolerance impairments that are limiting ability to complete usual activities such as sitting, walking, bed mobility, transfers, bending, dressing, lifting, personal care, traveling, social life, working as a Engineer, civil (consulting), homemaking, sleeping, and leisure without difficulty. Patient will benefit from skilled physical therapy intervention to address current body structure impairments and activity limitations to improve function and work towards goals set in current POC in order to return to prior level of function or maximal functional improvement.      OBJECTIVE IMPAIRMENTS: Abnormal gait, decreased activity tolerance, decreased balance, decreased coordination, decreased endurance, decreased knowledge of condition, decreased mobility, difficulty walking, decreased ROM, decreased strength, increased muscle spasms, impaired flexibility, improper body mechanics, obesity, and pain.   ACTIVITY LIMITATIONS: carrying, lifting, bending, sitting, standing, squatting, sleeping, stairs, transfers, bed mobility, bathing, dressing, locomotion level, and caring for others  PARTICIPATION LIMITATIONS: meal prep, cleaning, laundry, interpersonal relationship, driving, shopping, community activity, occupation, yard work, and church  PERSONAL FACTORS: Fitness, Past/current experiences, Time since onset of injury/illness/exacerbation, and 3+ comorbidities:   OSA (obstructive sleep apnea); S/P hysterectomy; Controlled diabetes mellitus type 2 with complications (HCC); Morbid obesity (HCC); Hyperlipidemia associated with type 2 diabetes mellitus (HCC); Microscopic hematuria; Fatty liver; HTN (hypertension); Anxiety; Osteoarthritis of spine with radiculopathy,  cervical region; Osteoarthritis of spine with radiculopathy, thoracic region; Cervical spondylosis with myelopathy and radiculopathy; Uveitis; Chronic low back pain; Disorder of right median nerve; History of carpal tunnel surgery of right wrist; Lumbar radiculopathy; Numbness of hand; Numbness of upper limb; Pain in right foot; and Chronic anterior uveitis of left eye on their problem list. she  has a past medical history of Allergy (2012), Anxiety, Arthritis, Asthma, Diabetes mellitus without complication (HCC) (2016), GERD (gastroesophageal reflux disease), Heart murmur, History of kidney stones, Hypertension, Neuromuscular disorder (HCC), PONV (postoperative nausea and vomiting), Shingles, and Sleep apnea. she  has a past surgical history that includes Cesarean section; Spinal fusion (2012); Total abdominal hysterectomy; Diagnostic laparoscopy (2006); Cholecystectomy (N/A, 01/18/2017); Colonoscopy with propofol  (N/A, 10/13/2020); Anterior cervical decomp/discectomy fusion (N/A, 04/13/2022); Extracorporeal shock wave lithotripsy (Left, 07/12/2022); Cystoscopy/ureteroscopy/holmium laser/stent placement (Left, 08/21/2022); and Carpel Tunnel Surgery (Right, 10/01/2023). are also affecting patient's functional outcome.   REHAB POTENTIAL: Good  CLINICAL DECISION MAKING: Evolving/moderate complexity  EVALUATION COMPLEXITY: Moderate   GOALS: Goals reviewed with patient? Yes  SHORT TERM GOALS: Target date: 02/26/2024  Patient will be independent with initial home exercise program for self-management of symptoms. Baseline: Initial HEP provided at IE (02/12/24); Goal status: In progress   LONG TERM GOALS: Target date: 05/06/2024  Patient will be independent with a long-term home exercise program for self-management of symptoms.  Baseline: Initial HEP provided at IE (02/12/24); Goal status: In progress  2.  Patient will demonstrate improved Modified Oswestry Disability Index (mODI) to equal or less  than 10% to demonstrate improvement in overall condition and self-reported functional ability.  Baseline: 56% (02/12/24); Goal status: In progress  3.  Patient will demonstrate the ability to perform sit <> stand and bed mobility  without increased pain and without pain behaviars to improve her ability to work and sleep.  Baseline: painful and altered movement pattern (02/12/24); Goal status: In progress  4.  Patient will demonstrate 10 rep heel raise equal bilaterally to demonstrate improved LE function for working, ADLs, and IADLs.  Baseline: unable to do heel raises on R side but able on L (02/12/24); Goal status: In progress  5.  Patient will demonstrate improvement in Patient Specific Functional Scale (PSFS) of equal or greater than 8/10 points to reflect clinically significant improvement in patient's most valued functional activities. Baseline: to be measured at visit 2 as appropriate (02/12/24); 3.5/10 (02/17/2024);  Goal status: in progress  6.  Patient will report NPRS equal or less than 3/10 during functional activities during the last 2 weeks to improve their abilitly to complete community, work and/or recreational activities with less limitation. Baseline: up to 10/10 (02/12/24); Goal status: In progress   PLAN:  PT FREQUENCY: 2x/week  PT DURATION: 8-12 weeks  PLANNED INTERVENTIONS: 97164- PT Re-evaluation, 97750- Physical Performance Testing, 97110-Therapeutic exercises, 97530- Therapeutic activity, 97112- Neuromuscular re-education, 97535- Self Care, 02859- Manual therapy, [id number]- Electrical stimulation (unattended), 20560 (1-2 muscles), 20561 (3+ muscles)- Dry Needling, Patient/Family education, Balance training, Joint mobilization, Spinal mobilization, Cryotherapy, and Moist heat.  PLAN FOR NEXT SESSION: update HEP as appropriate, education on mechanical stressors and modifications of activities to avoid them, recover motor control and awareness of modifiers to mechanical  sensitivities, retrain motor patterns, regain ROM, improve strength and resilience needed for  performing desired functional performance with appropriate ROM, strength, power, and endurance. Manual therapy as needed.   Camie SAUNDERS. Juli, PT, DPT, Cert. MDT 03/03/24, 12:12 PM  [facility] [address] [place], [place] [address] P: [phone] I F: [phone]

## 2024-03-04 ENCOUNTER — Encounter: Admitting: Physical Therapy

## 2024-03-05 ENCOUNTER — Ambulatory Visit: Admitting: Occupational Therapy

## 2024-03-05 ENCOUNTER — Ambulatory Visit: Admitting: Physical Therapy

## 2024-03-05 ENCOUNTER — Encounter: Payer: Self-pay | Admitting: Physical Therapy

## 2024-03-05 DIAGNOSIS — M25631 Stiffness of right wrist, not elsewhere classified: Secondary | ICD-10-CM | POA: Diagnosis not present

## 2024-03-05 DIAGNOSIS — M5416 Radiculopathy, lumbar region: Secondary | ICD-10-CM

## 2024-03-05 DIAGNOSIS — R2689 Other abnormalities of gait and mobility: Secondary | ICD-10-CM | POA: Diagnosis not present

## 2024-03-05 DIAGNOSIS — R208 Other disturbances of skin sensation: Secondary | ICD-10-CM | POA: Diagnosis not present

## 2024-03-05 DIAGNOSIS — L905 Scar conditions and fibrosis of skin: Secondary | ICD-10-CM | POA: Diagnosis not present

## 2024-03-05 DIAGNOSIS — M6281 Muscle weakness (generalized): Secondary | ICD-10-CM | POA: Diagnosis not present

## 2024-03-05 NOTE — Therapy (Signed)
 OUTPATIENT PHYSICAL THERAPY  TREATMENT   Patient Name: Tricia Hill MRN: [id number] DOB:1970/09/23, 53 y.o., female Today's Date: 03/05/2024  END OF SESSION:  PT End of Session - 03/05/24 1117     Visit Number 6    Number of Visits 17    Date for Recertification  05/06/24    Authorization Type [place] [employer] PPO reporting from 02/12/2024    Progress Note Due on Visit 10    PT Start Time 1115    PT Stop Time 1200    PT Time Calculation (min) 45 min    Activity Tolerance Patient limited by pain;Patient tolerated treatment well    Behavior During Therapy [facility] for tasks assessed/performed               Past Medical History:  Diagnosis Date   Allergy 2012   Anxiety    Arthritis    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   Asthma    Diabetes mellitus without complication (HCC) 2016   GERD (gastroesophageal reflux disease)    Heart murmur    child   History of kidney stones    Hypertension    Neuromuscular disorder (HCC)    right hands tingling and numbness   PONV (postoperative nausea and vomiting)    Shingles    Sleep apnea    USE CPAP   Past Surgical History:  Procedure Laterality Date   ANTERIOR CERVICAL DECOMP/DISCECTOMY FUSION N/A 04/13/2022   Procedure: Anterior Cervical Discectomy Fusion - Cervical seven-Thoracic one removal of plate;  Surgeon: Louis Shove, MD;  Location: [facility] OR;  Service: Neurosurgery;  Laterality: N/A;   Carpel Tunnel Surgery Right 10/01/2023   CESAREAN SECTION     X 2   CHOLECYSTECTOMY N/A 01/18/2017   Procedure: LAPAROSCOPIC CHOLECYSTECTOMY WITH INTRAOPERATIVE CHOLANGIOGRAM;  Surgeon: Dessa Reyes ORN, MD;  Location: [facility] ORS;  Service: General;  Laterality: N/A;   COLONOSCOPY WITH PROPOFOL  N/A 10/13/2020   Procedure: COLONOSCOPY WITH PROPOFOL ;  Surgeon: Unk Corinn Skiff, MD;  Location: [facility] ENDOSCOPY;  Service: Gastroenterology;  Laterality: N/A;  COVID POSITIVE 09/30/2020   CYSTOSCOPY/URETEROSCOPY/HOLMIUM LASER/STENT PLACEMENT Left  08/21/2022   Procedure: CYSTOSCOPY/URETEROSCOPY/HOLMIUM LASER/STENT PLACEMENT;  Surgeon: Twylla Glendia BROCKS, MD;  Location: [facility] ORS;  Service: Urology;  Laterality: Left;   DIAGNOSTIC LAPAROSCOPY  2006   EXCISION OF ABDOMINAL WALL ENDOMETRIOMA   EXTRACORPOREAL SHOCK WAVE LITHOTRIPSY Left 07/12/2022   Procedure: EXTRACORPOREAL SHOCK WAVE LITHOTRIPSY (ESWL);  Surgeon: Penne Knee, MD;  Location: [facility] ORS;  Service: Urology;  Laterality: Left;   SPINAL FUSION  2012   C3 and C4   TOTAL ABDOMINAL HYSTERECTOMY     Partial   Patient Active Problem List   Diagnosis Date Noted   Chronic anterior uveitis of left eye 02/05/2024   Chronic low back pain 12/03/2023   Disorder of right median nerve 12/03/2023   Lumbar radiculopathy 12/03/2023   Numbness of hand 12/03/2023   History of carpal tunnel surgery of right wrist 10/01/2023   Uveitis 07/11/2023   Numbness of upper limb 05/28/2023   Pain in right foot 05/28/2023   Cervical spondylosis with myelopathy and radiculopathy 04/13/2022   Osteoarthritis of spine with radiculopathy, cervical region 02/08/2022   Osteoarthritis of spine with radiculopathy, thoracic region 02/08/2022   Anxiety 09/05/2021   HTN (hypertension) 04/16/2018   Fatty liver 12/07/2016   Microscopic hematuria 05/26/2015   OSA (obstructive sleep apnea) 01/31/2015   S/P hysterectomy 01/31/2015   Controlled diabetes mellitus type 2 with complications (HCC) 01/31/2015  Morbid obesity (HCC) 01/31/2015   Hyperlipidemia associated with type 2 diabetes mellitus (HCC) 01/31/2015    PCP: Vicci Duwaine SQUIBB, DO  REFERRING PROVIDER: Louis Shove, MD (neurosurgery)  REFERRING DIAG: lumbar radiculopathy  Rationale for Evaluation and Treatment: Rehabilitation  THERAPY DIAG:  Radiculopathy, lumbar region  Muscle weakness (generalized)  Other abnormalities of gait and mobility  ONSET DATE: for > 5 years but legs worse over the last few months, especially since carpal tunnel  surgery  SUBJECTIVE:                                                                                                                                                                                            PERTINENT HISTORY:  Patient is a 53 y.o. female who presents to outpatient physical therapy with a referral for medical diagnosis lumbar radiculopathy. This patient's chief complaints consist of low back and leg pain and leg weakness leading to the following functional deficits: difficulty with usual activities such as sitting, walking, bed mobility, transfers, bending, dressing, lifting, personal care, traveling, social life, working as a Engineer, civil (consulting), homemaking, sleeping, and leisure. Relevant past medical history and comorbidities include the following: she has OSA (obstructive sleep apnea); S/P hysterectomy; Controlled diabetes mellitus type 2 with complications (HCC); Morbid obesity (HCC); Hyperlipidemia associated with type 2 diabetes mellitus (HCC); Microscopic hematuria; Fatty liver; HTN (hypertension); Anxiety; Osteoarthritis of spine with radiculopathy, cervical region; Osteoarthritis of spine with radiculopathy, thoracic region; Cervical spondylosis with myelopathy and radiculopathy; Uveitis; Chronic low back pain; Disorder of right median nerve; History of carpal tunnel surgery of right wrist; Lumbar radiculopathy; Numbness of hand; Numbness of upper limb; Pain in right foot; and Chronic anterior uveitis of left eye on their problem list. she  has a past medical history of Allergy (2012), Anxiety, Arthritis, Asthma, Diabetes mellitus without complication (HCC) (2016), GERD (gastroesophageal reflux disease), Heart murmur, History of kidney stones, Hypertension, Neuromuscular disorder (HCC), PONV (postoperative nausea and vomiting), Shingles, and Sleep apnea. she  has a past surgical history that includes Cesarean section; Spinal fusion (2012); Total abdominal hysterectomy; Diagnostic laparoscopy  (2006); Cholecystectomy (N/A, 01/18/2017); Colonoscopy with propofol  (N/A, 10/13/2020); Anterior cervical decomp/discectomy fusion (N/A, 04/13/2022); Extracorporeal shock wave lithotripsy (Left, 07/12/2022); Cystoscopy/ureteroscopy/holmium laser/stent placement (Left, 08/21/2022); and Carpel Tunnel Surgery (Right, 10/01/2023). Patient denies hx of cancer, stroke, seizures, lung problems, and unexplained stumbling or dropping things. Drops things. Has had 2 neck surgeries but no low back surgeries.   She states she has urinary incontinence and this has worsened over time. She is always changing her underwear or wearing a pad. Usually it is when coughing and sneezing. Also has urgency  and it can start leaking before she gets to the bathroom. It is harder to hold the closer she gets to the toilet.   Exercise history: joined the gym in hopes to lose weight but cannot go because of low back pian. She wanted to do weight lifting.   She is terrified because her mom has similar back problems and cannot do anything now and has sort of given up. Her sister is also going through the same thing with her back. Patient's kids are getting married and she wants to be active and do things with her grandchildren.   SUBJECTIVE STATEMENT: Patient states she felt better after last PT session including after walking around the grocery store following her OT appointment. She states things are not as bad today, but she still has pain in her right thigh and central low back.   PAIN: NPRS: Current: 5/10 at central low back R posterior thigh.   From initial PT evaluation on 02/12/24:  Best: 0/10, Worst: 10/10. Pain location: Across top of pelvis (was more on R but now more L). This is the starting area. It would ache and get better. Now it is constantly there. She now has intermittent pain down the back of both thighs to the tops of both feet which can happen together or be one side or the other.  Pain description: Dull,  occasionally sharp like a zap behind left knee Aggravating factors: Getting up from sitting, prolonged standing ( ), prolonged walking (10 min), sitting (legs hurt), standing legs and back hurt, sitting with legs propped up in front of her makes her legs hurt more, bending.  Relieving factors: injection partially, sitting can help her back (but makes her legs hurt)   PRECAUTIONS: No lifting of 10# or more due to R hand (recommended by OT so it doesn't damage her neck and arm until she gets more strength in her arm).   WEIGHT BEARING RESTRICTIONS: No  PATIENT GOALS:  I just want the pain to be more bearable. Be able to do some of the things I used to do. Walking for fitness with husband.   OBJECTIVE  TREATMENT                                                                                                                          Manual therapy: to reduce pain and tissue tension, improve range of motion, neuromodulation, in order to promote improved ability to complete functional activities.  Hooklying intermittent lumbar traction with belt around knees 5 min x10 seconds on/off  Increased pain in lateral hips from glutes  Sidelying  STM to QL and lumbar paraspinals bilaterally (very tender to QL with some palpable relaxation with sustained hold).   Neuromuscular Re-education: a technique or exercise performed with the goal of improving the level of communication between the body and the brain, such as for balance, motor control, muscle activation patterns, coordination, desensitization, quality of muscle contraction, proprioception, and/or kinesthetic sense needed for successful  and safe completion of functional activities.   Half hooklying PPT/TrA/pelvic floor contraction with isometric hip extension with knee extended, focusing on coordinated movement to activate the lumbar multifidi for improved segmental control 2x5 each side with 5 seconds and ankle over folded over pillow Tactile  feedback to help improve pelvic tilt   Therapeutic exercise: therapeutic exercises that incorporate ONE parameter at one or more areas of the body to centralize symptoms, develop strength and endurance, range of motion, and flexibility required for successful completion of functional activities. Seated lumbar flexion roll out 3 min forwards with self selected pace  Education and trial of ways to do self-traction with elbows or body on table top from standing  Pt required multimodal cuing for proper technique and to facilitate improved neuromuscular control, strength, range of motion, and functional ability resulting in improved performance and form.  PATIENT EDUCATION:  Education details: Exercise purpose/form. Self management techniques. Education on diagnosis, prognosis, POC, anatomy and physiology of current condition. Education on HEP including handout. Person educated: Patient Education method: Explanation, Demonstration, and Verbal cues, handouts Education comprehension: verbalized understanding, returned demonstration, and needs further education  HOME EXERCISE PROGRAM: Access Code: [email or web address] URL: [email or web address] Date: 02/24/2024 Prepared by: Camie Cleverly  Exercises - Sitting to Supine Roll  - Prone Hip Extension with Pillow Under Abdomen  - 1 x daily - 3 sets - 5-10 reps - 5 second hold hold - Supine Pelvic Tilt  - 1 x daily - 1 sets - 20 reps  Avoid L shaped positions Lay down on back with knees flexed for 5-10 min periodically throughout day  ASSESSMENT:  CLINICAL IMPRESSION: Patient with good results after last PT session so repeated similar interventions. Patient with increased low back discomfort by end of session but decreased R thigh pain. Plan to try teaching self psoas stretch next session. Patient still unsuccessful with self-traction variations. Patient would benefit from continued management of limiting condition by skilled physical therapist to  address remaining impairments and functional limitations to work towards stated goals and return to PLOF or maximal functional independence.   Mechanical sensitivities: load, nerve tension (L > R), extension (possibly multidirectional but other motions only tested under load).    From initial PT evaluation on 02/12/2024:  Patient is a 53 y.o. female referred to outpatient physical therapy with a medical diagnosis of lumbar radiculopathy who presents with signs and symptoms consistent with low back pain with radiation to bilateral LE with mechanical sensitivities to nerve tension, compressive load, and possibly multidirectional ROM. Also likely sensitive to shear forces at the lumbar spine. Patient presents with significant pain, ROM, muscle performance (strength/power/endurance), balance, motor control, knowledge, nerve tension, gait, and activity tolerance impairments that are limiting ability to complete usual activities such as sitting, walking, bed mobility, transfers, bending, dressing, lifting, personal care, traveling, social life, working as a Engineer, civil (consulting), homemaking, sleeping, and leisure without difficulty. Patient will benefit from skilled physical therapy intervention to address current body structure impairments and activity limitations to improve function and work towards goals set in current POC in order to return to prior level of function or maximal functional improvement.      OBJECTIVE IMPAIRMENTS: Abnormal gait, decreased activity tolerance, decreased balance, decreased coordination, decreased endurance, decreased knowledge of condition, decreased mobility, difficulty walking, decreased ROM, decreased strength, increased muscle spasms, impaired flexibility, improper body mechanics, obesity, and pain.   ACTIVITY LIMITATIONS: carrying, lifting, bending, sitting, standing, squatting, sleeping, stairs, transfers, bed mobility, bathing, dressing, locomotion level, and caring for  others  PARTICIPATION LIMITATIONS: meal prep, cleaning, laundry, interpersonal relationship, driving, shopping, community activity, occupation, yard work, and church  PERSONAL FACTORS: Fitness, Past/current experiences, Time since onset of injury/illness/exacerbation, and 3+ comorbidities:   OSA (obstructive sleep apnea); S/P hysterectomy; Controlled diabetes mellitus type 2 with complications (HCC); Morbid obesity (HCC); Hyperlipidemia associated with type 2 diabetes mellitus (HCC); Microscopic hematuria; Fatty liver; HTN (hypertension); Anxiety; Osteoarthritis of spine with radiculopathy, cervical region; Osteoarthritis of spine with radiculopathy, thoracic region; Cervical spondylosis with myelopathy and radiculopathy; Uveitis; Chronic low back pain; Disorder of right median nerve; History of carpal tunnel surgery of right wrist; Lumbar radiculopathy; Numbness of hand; Numbness of upper limb; Pain in right foot; and Chronic anterior uveitis of left eye on their problem list. she  has a past medical history of Allergy (2012), Anxiety, Arthritis, Asthma, Diabetes mellitus without complication (HCC) (2016), GERD (gastroesophageal reflux disease), Heart murmur, History of kidney stones, Hypertension, Neuromuscular disorder (HCC), PONV (postoperative nausea and vomiting), Shingles, and Sleep apnea. she  has a past surgical history that includes Cesarean section; Spinal fusion (2012); Total abdominal hysterectomy; Diagnostic laparoscopy (2006); Cholecystectomy (N/A, 01/18/2017); Colonoscopy with propofol  (N/A, 10/13/2020); Anterior cervical decomp/discectomy fusion (N/A, 04/13/2022); Extracorporeal shock wave lithotripsy (Left, 07/12/2022); Cystoscopy/ureteroscopy/holmium laser/stent placement (Left, 08/21/2022); and Carpel Tunnel Surgery (Right, 10/01/2023). are also affecting patient's functional outcome.   REHAB POTENTIAL: Good  CLINICAL DECISION MAKING: Evolving/moderate complexity  EVALUATION COMPLEXITY:  Moderate   GOALS: Goals reviewed with patient? Yes  SHORT TERM GOALS: Target date: 02/26/2024  Patient will be independent with initial home exercise program for self-management of symptoms. Baseline: Initial HEP provided at IE (02/12/24); Goal status: In progress   LONG TERM GOALS: Target date: 05/06/2024  Patient will be independent with a long-term home exercise program for self-management of symptoms.  Baseline: Initial HEP provided at IE (02/12/24); Goal status: In progress  2.  Patient will demonstrate improved Modified Oswestry Disability Index (mODI) to equal or less than 10% to demonstrate improvement in overall condition and self-reported functional ability.  Baseline: 56% (02/12/24); Goal status: In progress  3.  Patient will demonstrate the ability to perform sit <> stand and bed mobility without increased pain and without pain behaviars to improve her ability to work and sleep.  Baseline: painful and altered movement pattern (02/12/24); Goal status: In progress  4.  Patient will demonstrate 10 rep heel raise equal bilaterally to demonstrate improved LE function for working, ADLs, and IADLs.  Baseline: unable to do heel raises on R side but able on L (02/12/24); Goal status: In progress  5.  Patient will demonstrate improvement in Patient Specific Functional Scale (PSFS) of equal or greater than 8/10 points to reflect clinically significant improvement in patient's most valued functional activities. Baseline: to be measured at visit 2 as appropriate (02/12/24); 3.5/10 (02/17/2024);  Goal status: in progress  6.  Patient will report NPRS equal or less than 3/10 during functional activities during the last 2 weeks to improve their abilitly to complete community, work and/or recreational activities with less limitation. Baseline: up to 10/10 (02/12/24); Goal status: In progress   PLAN:  PT FREQUENCY: 2x/week  PT DURATION: 8-12 weeks  PLANNED INTERVENTIONS: [id number]-  PT Re-evaluation, 97750- Physical Performance Testing, 97110-Therapeutic exercises, 97530- Therapeutic activity, 97112- Neuromuscular re-education, 97535- Self Care, 02859- Manual therapy, [id number]- Electrical stimulation (unattended), 20560 (1-2 muscles), 20561 (3+ muscles)- Dry Needling, Patient/Family education, Balance training, Joint mobilization, Spinal mobilization, Cryotherapy, and Moist heat.  PLAN FOR NEXT SESSION: update HEP as appropriate,  education on mechanical stressors and modifications of activities to avoid them, recover motor control and awareness of modifiers to mechanical sensitivities, retrain motor patterns, regain ROM, improve strength and resilience needed for  performing desired functional performance with appropriate ROM, strength, power, and endurance. Manual therapy as needed.   Camie SAUNDERS. Juli, PT, DPT, Cert. MDT 03/05/24, 12:13 PM  [facility] [address] [place], [place] [address] P: [phone] I F: [phone]

## 2024-03-09 ENCOUNTER — Ambulatory Visit: Admitting: Occupational Therapy

## 2024-03-09 ENCOUNTER — Ambulatory Visit: Admitting: Physical Therapy

## 2024-03-09 ENCOUNTER — Encounter: Payer: Self-pay | Admitting: Physical Therapy

## 2024-03-09 DIAGNOSIS — M5416 Radiculopathy, lumbar region: Secondary | ICD-10-CM

## 2024-03-09 DIAGNOSIS — L905 Scar conditions and fibrosis of skin: Secondary | ICD-10-CM | POA: Diagnosis not present

## 2024-03-09 DIAGNOSIS — M6281 Muscle weakness (generalized): Secondary | ICD-10-CM | POA: Diagnosis not present

## 2024-03-09 DIAGNOSIS — R2689 Other abnormalities of gait and mobility: Secondary | ICD-10-CM

## 2024-03-09 DIAGNOSIS — R208 Other disturbances of skin sensation: Secondary | ICD-10-CM | POA: Diagnosis not present

## 2024-03-09 DIAGNOSIS — M25631 Stiffness of right wrist, not elsewhere classified: Secondary | ICD-10-CM | POA: Diagnosis not present

## 2024-03-09 NOTE — Therapy (Signed)
 OUTPATIENT OCCUPATIONAL THERAPY ORTHO TREATMENT  Patient Name: Tricia Hill MRN: [id number] DOB:[birth year], 53 y.o., female Today's Date: 03/09/2024  PCP: Dr Vicci MART PROVIDER: Dr Louis  END OF SESSION:  OT End of Session - 03/09/24 1211     Visit Number 6    Number of Visits 12    Date for Recertification  03/24/24    OT Start Time 1204    OT Stop Time 1233    OT Time Calculation (min) 29 min    Activity Tolerance Patient tolerated treatment well    Behavior During Therapy [facility] for tasks assessed/performed          Past Medical History:  Diagnosis Date   Allergy 2012   Anxiety    Arthritis    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   Asthma    Diabetes mellitus without complication (HCC) 2016   GERD (gastroesophageal reflux disease)    Heart murmur    child   History of kidney stones    Hypertension    Neuromuscular disorder (HCC)    right hands tingling and numbness   PONV (postoperative nausea and vomiting)    Shingles    Sleep apnea    USE CPAP   Past Surgical History:  Procedure Laterality Date   ANTERIOR CERVICAL DECOMP/DISCECTOMY FUSION N/A 04/13/2022   Procedure: Anterior Cervical Discectomy Fusion - Cervical seven-Thoracic one removal of plate;  Surgeon: Louis Shove, MD;  Location: [facility] OR;  Service: Neurosurgery;  Laterality: N/A;   Carpel Tunnel Surgery Right 10/01/2023   CESAREAN SECTION     X 2   CHOLECYSTECTOMY N/A 01/18/2017   Procedure: LAPAROSCOPIC CHOLECYSTECTOMY WITH INTRAOPERATIVE CHOLANGIOGRAM;  Surgeon: Dessa Reyes ORN, MD;  Location: [facility] ORS;  Service: General;  Laterality: N/A;   COLONOSCOPY WITH PROPOFOL  N/A 10/13/2020   Procedure: COLONOSCOPY WITH PROPOFOL ;  Surgeon: Unk Corinn Skiff, MD;  Location: [facility] ENDOSCOPY;  Service: Gastroenterology;  Laterality: N/A;  COVID POSITIVE 09/30/2020   CYSTOSCOPY/URETEROSCOPY/HOLMIUM LASER/STENT PLACEMENT Left 08/21/2022   Procedure: CYSTOSCOPY/URETEROSCOPY/HOLMIUM LASER/STENT PLACEMENT;   Surgeon: Twylla Glendia BROCKS, MD;  Location: [facility] ORS;  Service: Urology;  Laterality: Left;   DIAGNOSTIC LAPAROSCOPY  2006   EXCISION OF ABDOMINAL WALL ENDOMETRIOMA   EXTRACORPOREAL SHOCK WAVE LITHOTRIPSY Left 07/12/2022   Procedure: EXTRACORPOREAL SHOCK WAVE LITHOTRIPSY (ESWL);  Surgeon: Penne Knee, MD;  Location: [facility] ORS;  Service: Urology;  Laterality: Left;   SPINAL FUSION  2012   C3 and C4   TOTAL ABDOMINAL HYSTERECTOMY     Partial   Patient Active Problem List   Diagnosis Date Noted   Chronic anterior uveitis of left eye 02/05/2024   Chronic low back pain 12/03/2023   Disorder of right median nerve 12/03/2023   Lumbar radiculopathy 12/03/2023   Numbness of hand 12/03/2023   History of carpal tunnel surgery of right wrist 10/01/2023   Uveitis 07/11/2023   Numbness of upper limb 05/28/2023   Pain in right foot 05/28/2023   Cervical spondylosis with myelopathy and radiculopathy 04/13/2022   Osteoarthritis of spine with radiculopathy, cervical region 02/08/2022   Osteoarthritis of spine with radiculopathy, thoracic region 02/08/2022   Anxiety 09/05/2021   HTN (hypertension) 04/16/2018   Fatty liver 12/07/2016   Microscopic hematuria 05/26/2015   OSA (obstructive sleep apnea) 01/31/2015   S/P hysterectomy 01/31/2015   Controlled diabetes mellitus type 2 with complications (HCC) 01/31/2015   Morbid obesity (HCC) 01/31/2015   Hyperlipidemia associated with type 2 diabetes mellitus (HCC) 01/31/2015    ONSET DATE: 10/01/23  REFERRING DIAG: R CTR  THERAPY DIAG:  Radiculopathy, lumbar region  Muscle weakness (generalized)  Scar tissue  Other disturbances of skin sensation  Stiffness of right wrist, not elsewhere classified  Rationale for Evaluation and Treatment: Rehabilitation  SUBJECTIVE:   SUBJECTIVE STATEMENT: I worked last night.  I do take my putty for gripping.  But no pinching.  My thumb still hurt when I scoop the dog's food in the bowl  PERTINENT  HISTORY: DR Louis note: The patient returns today in follow-up. She continues to have some right him discomfort as she recover some carpal tunnel surgery. She has not started her physical therapy. She also notes persistent posterior cervical pain with some radiation. She is having no definite weakness. Her lumbar pain is stable. She did not get much improvement following recent epidural steroid injection   PRECAUTIONS: None    WEIGHT BEARING RESTRICTIONS: No  PAIN:  Are you having pain? 2/10 dorsal thumb as well as hypothenar eminence  FALLS: Has patient fallen in last 6 months? 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  But not at the moment on medical leave.  Does house stuff around with cooking and cleaning and laundry.  On her phone.  Reads some.  Has 2 grownup kids and a 49 -year-old-does a little flowers  PATIENT GOALS: I just want the numbness better and the pain but then also more strength so I can do my job as a Engineer, civil (consulting)  NEXT MD VISIT: 21 August  OBJECTIVE:  Note: Objective measures were completed at Evaluation unless otherwise noted.  HAND DOMINANCE: Right  ADLs: Difficulty with cutting food, cooking doing laundry.  Putting on jewelry, do buttons, opening jars or packages or Ziploc bags: Cannot grip or hold things I drop things  FUNCTIONAL OUTCOME MEASURES: To be done excision  UPPER EXTREMITY ROM:     Active ROM Right eval Left eval  Shoulder flexion    Shoulder abduction    Shoulder adduction    Shoulder extension    Shoulder internal rotation    Shoulder external rotation    Elbow flexion    Elbow extension    Wrist flexion 70   Wrist extension 64   Wrist ulnar deviation    Wrist radial deviation    Wrist pronation    Wrist supination    (Blank rows = not tested)  Active ROM Right eval Left eval  Thumb MCP (0-60)    Thumb IP (0-80)    Thumb Radial abd/add (0-55)     Thumb  Palmar abd/add (0-45)     Thumb Opposition to Small Finger     Index MCP (0-90)     Index PIP (0-100)     Index DIP (0-70)      Long MCP (0-90)      Long PIP (0-100)      Long DIP (0-70)      Ring MCP (0-90)      Ring PIP (0-100)      Ring DIP (0-70)      Little MCP (0-90)      Little PIP (0-100)      Little DIP (0-70)      Digit flexion within normal limits but tight when attempting to make a fist. Opposition within normal limits. Thumb palmar radial abduction within normal limits  HAND FUNCTION: Grip strength: Right: 27 lbs; Left: 50 lbs, Lateral pinch: Right: 4 lbs, Left:  12 lbs, and 3 point pinch: Right: 6 lbs, Left: 14 lbs 02/06/24: Grip strength: Right: 29 lbs; Left: 50 lbs, Lateral pinch: Right: 4 lbs, Left: 12 lbs, and 3 point pinch: Right: 6 lbs, Left: 14 lbs  02/11/24: Grip strength: Right: 42 lbs; Left: 50 lbs, Lateral pinch: Right: 5 lbs, Left: 12 lbs, and 3 point pinch: Right: 7 lbs, Left: 14 lbs 03/03/24: Grip strength: Right: 44 lbs; Left: 50 lbs, Lateral pinch: Right: 6 lbs, Left: 12 lbs, and 3 point pinch: Right: 7 lbs, Left: 14 lbs COORDINATION: Decrease in limited because of numbness in digits at times  SENSATION: Patient report increased numbness from thumb through fourth digit on and off during the day and night  EDEMA: Not noticeable.  COGNITION: Overall cognitive status: Within functional limits for tasks assessed      TREATMENT DATE: 03/09/24                                                                                                                             Patient return after doing some home exercises for a week.  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  Doing a lot of writing and on the computer.  Helping the nurses with medication. Reports some discomfort and pain over the dorsal thumb as well as wrist with and scooping dog food in bowl performing lateral pinch with pronation.  Patient continues to have decreased radial deviation and ECR  strength causing ulnar deviation with a lateral pinch when scooping the foot  Simulated activity could not elicit same pain consistently. Had no pain with resistance to thumb in all range of motion's Also some pain at the hypothenar eminence.  Upon assessment patient's grip improved last visit but all measurements was the same this date for grip and prehension Patient also with increased wrist flexion extension and ulnar deviation. Continues to show decreased radial deviation strength as well as supination pronation Upon assessment when patient does a grip or lateral pinch patient ulnar deviate forearm unable to maintain midline.   Patient continues to do scar massage and daughter assist with soft tissue mobilization  Continue with: tendon glides 12 reps Patient to continue with rubber band for resistance for palmar radial abduction 3 sets of 12 2 times a day.  Patient have to focus to maintain position of palmar abduction Reviewing patient to continue with 2 pound weight for radial deviation on the armrest from neutral 2 sets of 12-15 reps 2 times a day And 2 pound weight for supination and pronation 15 reps 2 times a day maintaining third metacarpal in line with middle line of wrist with supination As well as wrist extension maintaining 2 sets of 12  Continue with green medium firm putty   - 2 sets 12 to 15 reps each: gripping ,  key pinch and 3 pt pinch. - can roll inbetween if feel some tightness or spasm   - Patient to maintain wrist in  neutral with key pinch  Patient had a hard time doing 3-point pinch.  Patient hyperextends DIP of second third.  Left hand patient maintains flexion and precision pinch  Change patient's 3-point pinch to 2 point precision pinch removing small objects out of green putty alternating 2nd and 3rd digit.  Focus on IP flexion  2 sets of 12  quality more than quantity Patient upgrade to 2 sets of strengthening twice a day Plan to complete HEP 2 times a day  after contrast bath Patient to make a list of things that brings on numbness during the day.       PATIENT EDUCATION: Education details: findings of eval and HEP  Person educated: Patient Education method: Explanation, Demonstration, Tactile cues, Verbal cues, and Handouts Education comprehension: verbalized understanding, returned demonstration, verbal cues required, and needs further education   GOALS: Goals reviewed with patient? Yes LONG TERM GOALS: Target date: 8 weeks  Patient to be independent in home program to decrease scar tissue and tenderness to report decrease stiffness tightness and digit range of motion as well as individually tendon glides Baseline: Increased tightness and stiffness in digit flexion.  Scar thick and adhere as well as tender and pain when hitting no knowledge of scar mobilization and massage Goal status: INITIAL  2.  Wrist active range of motion improved to within normal limits symptom-free today able to push and pull heavy door. Baseline: Patient report pain and tenderness over carpal tunnel scar with pressure or hitting it as well as decreased wrist flexion extension unable to push or pull with hand has increased symptoms in the wrist and forearm Goal status: INITIAL  3.  Grip and prehension strength in the right improved to within normal range for her age for patient to be able to turn doorknob, cut with a knife, open Ziploc bags and do buttons symptom-free Baseline: Right grip 27 pounds left 50 pounds, lateral pinch right 4 pounds left 12 pounds and 3 point pinch right 6 pounds left 14 pounds Goal status: INITIAL  4.  Patient function on PRWHE improved with more than 10 points in ADLs and IADLs Baseline:  PRWHE score to be done next session Goal status: INITIAL   ASSESSMENT:  CLINICAL IMPRESSION: Continues to wear brace at nighttime, reports mild numbness to R 1-3 digits and pain with driving. Patient present at OT evaluation with increased  scar tissue with increased tenderness and pain in palm to volar forearm with use.  Patient made great progress from start of care and wrist range of motion and strength as well as grip and pinch strength.  Patient was not seen for about 2 to 3 weeks.  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  Writing and on the computer as well as distributing medication.  Patient continues to show decreased radial deviation and supination pronation.  Unable to maintain midline with gripping.  Patient's grip improved last visit but this date grip and prehension same than beginning of last week reviewed with patient putty and 2 pound weight exercises..  Patient compensating with ulnar side of hand.  Scar needs to continue to do scar mobilization.  Change home program to focus on 3 point and lateral pinch in combination with grip maintaining wrist neutral adding also 2 pound weight focusing on radial deviation, wrist extension and supination and pronation.  As well as reinforced importance of strengthening palmar radial abduction.  Patient can benefit from skilled OT services to decrease scar  tissue, pain and stiffness and increased motion and strength to return to prior level of function.  Patient did had a MRI to her cervical spine and is meeting with Dr. Louis this Thursday to review results.  PERFORMANCE DEFICITS: in functional skills including ADLs, IADLs, ROM, strength, pain, flexibility, decreased knowledge of use of DME, and UE functional use,   and psychosocial skills including environmental adaptation and routines and behaviors.   IMPAIRMENTS: are limiting patient from ADLs, IADLs, rest and sleep, play, leisure, and social participation.   COMORBIDITIES: has no other co-morbidities that affects occupational performance. Patient will benefit from skilled OT to address above impairments and improve overall function.  MODIFICATION OR ASSISTANCE TO COMPLETE EVALUATION: No  modification of tasks or assist necessary to complete an evaluation.  OT OCCUPATIONAL PROFILE AND HISTORY: Problem focused assessment: Including review of records relating to presenting problem.  CLINICAL DECISION MAKING: LOW - limited treatment options, no task modification necessary  REHAB POTENTIAL: Good for goals  EVALUATION COMPLEXITY: Low      PLAN:  OT FREQUENCY: 1-2x/week  OT DURATION: 6 weeks  PLANNED INTERVENTIONS: 97168 OT Re-evaluation, 97535 self care/ADL training, 02889 therapeutic exercise, 97530 therapeutic activity, 97112 neuromuscular re-education, 97140 manual therapy, 97035 ultrasound, 97018 paraffin, 02960 fluidotherapy, 97034 contrast bath, scar mobilization, passive range of motion, patient/family education, and DME and/or AE instructions    CONSULTED AND AGREED WITH PLAN OF CARE: Patient    Ancel Peters, OTR/L,CLT 03/09/2024, 1:19 PM

## 2024-03-09 NOTE — Therapy (Signed)
 OUTPATIENT PHYSICAL THERAPY  TREATMENT   Patient Name: Tricia Hill MRN: [id number] DOB:05/14/1971, 53 y.o., female Today's Date: 03/09/2024  END OF SESSION:  PT End of Session - 03/09/24 1153     Visit Number 7    Number of Visits 17    Date for Recertification  05/06/24    Authorization Type [place] [employer] reporting from 02/12/2024    Progress Note Due on Visit 10    PT Start Time 1117    PT Stop Time 1156    PT Time Calculation (min) 39 min    Activity Tolerance Patient limited by pain;Patient tolerated treatment well    Behavior During Therapy [facility] for tasks assessed/performed                Past Medical History:  Diagnosis Date   Allergy 2012   Anxiety    Arthritis    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   Asthma    Diabetes mellitus without complication (HCC) 2016   GERD (gastroesophageal reflux disease)    Heart murmur    child   History of kidney stones    Hypertension    Neuromuscular disorder (HCC)    right hands tingling and numbness   PONV (postoperative nausea and vomiting)    Shingles    Sleep apnea    USE CPAP   Past Surgical History:  Procedure Laterality Date   ANTERIOR CERVICAL DECOMP/DISCECTOMY FUSION N/A 04/13/2022   Procedure: Anterior Cervical Discectomy Fusion - Cervical seven-Thoracic one removal of plate;  Surgeon: Louis Shove, MD;  Location: [facility] OR;  Service: Neurosurgery;  Laterality: N/A;   Carpel Tunnel Surgery Right 10/01/2023   CESAREAN SECTION     X 2   CHOLECYSTECTOMY N/A 01/18/2017   Procedure: LAPAROSCOPIC CHOLECYSTECTOMY WITH INTRAOPERATIVE CHOLANGIOGRAM;  Surgeon: Dessa Reyes ORN, MD;  Location: [facility] ORS;  Service: General;  Laterality: N/A;   COLONOSCOPY WITH PROPOFOL  N/A 10/13/2020   Procedure: COLONOSCOPY WITH PROPOFOL ;  Surgeon: Unk Corinn Skiff, MD;  Location: [facility] ENDOSCOPY;  Service: Gastroenterology;  Laterality: N/A;  COVID POSITIVE 09/30/2020   CYSTOSCOPY/URETEROSCOPY/HOLMIUM LASER/STENT PLACEMENT Left  08/21/2022   Procedure: CYSTOSCOPY/URETEROSCOPY/HOLMIUM LASER/STENT PLACEMENT;  Surgeon: Twylla Glendia BROCKS, MD;  Location: [facility] ORS;  Service: Urology;  Laterality: Left;   DIAGNOSTIC LAPAROSCOPY  2006   EXCISION OF ABDOMINAL WALL ENDOMETRIOMA   EXTRACORPOREAL SHOCK WAVE LITHOTRIPSY Left 07/12/2022   Procedure: EXTRACORPOREAL SHOCK WAVE LITHOTRIPSY (ESWL);  Surgeon: Penne Knee, MD;  Location: [facility] ORS;  Service: Urology;  Laterality: Left;   SPINAL FUSION  2012   C3 and C4   TOTAL ABDOMINAL HYSTERECTOMY     Partial   Patient Active Problem List   Diagnosis Date Noted   Chronic anterior uveitis of left eye 02/05/2024   Chronic low back pain 12/03/2023   Disorder of right median nerve 12/03/2023   Lumbar radiculopathy 12/03/2023   Numbness of hand 12/03/2023   History of carpal tunnel surgery of right wrist 10/01/2023   Uveitis 07/11/2023   Numbness of upper limb 05/28/2023   Pain in right foot 05/28/2023   Cervical spondylosis with myelopathy and radiculopathy 04/13/2022   Osteoarthritis of spine with radiculopathy, cervical region 02/08/2022   Osteoarthritis of spine with radiculopathy, thoracic region 02/08/2022   Anxiety 09/05/2021   HTN (hypertension) 04/16/2018   Fatty liver 12/07/2016   Microscopic hematuria 05/26/2015   OSA (obstructive sleep apnea) 01/31/2015   S/P hysterectomy 01/31/2015   Controlled diabetes mellitus type 2 with complications (HCC) 01/31/2015  Morbid obesity (HCC) 01/31/2015   Hyperlipidemia associated with type 2 diabetes mellitus (HCC) 01/31/2015    PCP: Vicci Duwaine SQUIBB, DO  REFERRING PROVIDER: Louis Shove, MD (neurosurgery)  REFERRING DIAG: lumbar radiculopathy  Rationale for Evaluation and Treatment: Rehabilitation  THERAPY DIAG:  Radiculopathy, lumbar region  Muscle weakness (generalized)  Other abnormalities of gait and mobility  ONSET DATE: for > 5 years but legs worse over the last few months, especially since carpal tunnel  surgery  SUBJECTIVE:                                                                                                                                                                                            PERTINENT HISTORY:  Patient is a 53 y.o. female who presents to outpatient physical therapy with a referral for medical diagnosis lumbar radiculopathy. This patient's chief complaints consist of low back and leg pain and leg weakness leading to the following functional deficits: difficulty with usual activities such as sitting, walking, bed mobility, transfers, bending, dressing, lifting, personal care, traveling, social life, working as a Engineer, civil (consulting), homemaking, sleeping, and leisure. Relevant past medical history and comorbidities include the following: she has OSA (obstructive sleep apnea); S/P hysterectomy; Controlled diabetes mellitus type 2 with complications (HCC); Morbid obesity (HCC); Hyperlipidemia associated with type 2 diabetes mellitus (HCC); Microscopic hematuria; Fatty liver; HTN (hypertension); Anxiety; Osteoarthritis of spine with radiculopathy, cervical region; Osteoarthritis of spine with radiculopathy, thoracic region; Cervical spondylosis with myelopathy and radiculopathy; Uveitis; Chronic low back pain; Disorder of right median nerve; History of carpal tunnel surgery of right wrist; Lumbar radiculopathy; Numbness of hand; Numbness of upper limb; Pain in right foot; and Chronic anterior uveitis of left eye on their problem list. she  has a past medical history of Allergy (2012), Anxiety, Arthritis, Asthma, Diabetes mellitus without complication (HCC) (2016), GERD (gastroesophageal reflux disease), Heart murmur, History of kidney stones, Hypertension, Neuromuscular disorder (HCC), PONV (postoperative nausea and vomiting), Shingles, and Sleep apnea. she  has a past surgical history that includes Cesarean section; Spinal fusion (2012); Total abdominal hysterectomy; Diagnostic laparoscopy  (2006); Cholecystectomy (N/A, 01/18/2017); Colonoscopy with propofol  (N/A, 10/13/2020); Anterior cervical decomp/discectomy fusion (N/A, 04/13/2022); Extracorporeal shock wave lithotripsy (Left, 07/12/2022); Cystoscopy/ureteroscopy/holmium laser/stent placement (Left, 08/21/2022); and Carpel Tunnel Surgery (Right, 10/01/2023). Patient denies hx of cancer, stroke, seizures, lung problems, and unexplained stumbling or dropping things. Drops things. Has had 2 neck surgeries but no low back surgeries.   She states she has urinary incontinence and this has worsened over time. She is always changing her underwear or wearing a pad. Usually it is when coughing and sneezing. Also has urgency  and it can start leaking before she gets to the bathroom. It is harder to hold the closer she gets to the toilet.   Exercise history: joined the gym in hopes to lose weight but cannot go because of low back pian. She wanted to do weight lifting.   She is terrified because her mom has similar back problems and cannot do anything now and has sort of given up. Her sister is also going through the same thing with her back. Patient's kids are getting married and she wants to be active and do things with her grandchildren.   SUBJECTIVE STATEMENT: Patient states her friend brought in her TENS unit and patient used it at work last night. It helped control the pain in her low back, but she still feels some pressure at the lateral glutes and pain at the left glute and proximal thigh. She felt okay after her last PT session. She is really tired right now after working last night.   PAIN: NPRS: Current: 2/10 at central low back R posterior thigh.   From initial PT evaluation on 02/12/24:  Best: 0/10, Worst: 10/10. Pain location: Across top of pelvis (was more on R but now more L). This is the starting area. It would ache and get better. Now it is constantly there. She now has intermittent pain down the back of both thighs to the tops  of both feet which can happen together or be one side or the other.  Pain description: Dull, occasionally sharp like a zap behind left knee Aggravating factors: Getting up from sitting, prolonged standing ( ), prolonged walking (10 min), sitting (legs hurt), standing legs and back hurt, sitting with legs propped up in front of her makes her legs hurt more, bending.  Relieving factors: injection partially, sitting can help her back (but makes her legs hurt)   PRECAUTIONS: No lifting of 10# or more due to R hand (recommended by OT so it doesn't damage her neck and arm until she gets more strength in her arm).   WEIGHT BEARING RESTRICTIONS: No  PATIENT GOALS:  I just want the pain to be more bearable. Be able to do some of the things I used to do. Walking for fitness with husband.   OBJECTIVE  TREATMENT                                                                                                                          Manual therapy: to reduce pain and tissue tension, improve range of motion, neuromodulation, in order to promote improved ability to complete functional activities.  Hooklying intermittent lumbar traction with belt around knees 5 min x10 seconds on/off  Increased pain in lateral hips from glutes  Neuromuscular Re-education: a technique or exercise performed with the goal of improving the level of communication between the body and the brain, such as for balance, motor control, muscle activation patterns, coordination, desensitization, quality of muscle contraction, proprioception, and/or kinesthetic sense needed for successful  and safe completion of functional activities.   Half hooklying PPT/TrA/pelvic floor contraction with isometric hip extension with knee extended, focusing on coordinated movement to activate the lumbar multifidi for improved segmental control 2x5 each side with 5 seconds and ankle over bolster Tactile feedback to help improve pelvic tilt  Added to  HEP  Half-sidelying/prone Lock clams to improve isolation of glute/hip ER with trunk braced and with pelvic rotation minimized 1x10 each side with 5 second holds Added to HEP   Therapeutic exercise: therapeutic exercises that incorporate ONE parameter at one or more areas of the body to centralize symptoms, develop strength and endurance, range of motion, and flexibility required for successful completion of functional activities.  Half kneeling psoas stretch with knee on folded yoga mat for comfort 3x45 seconds each side  moderate cuing for pelvic position at first   Seated lumbar flexion roll out 2 min forwards with self selected pace  Pt required multimodal cuing for proper technique and to facilitate improved neuromuscular control, strength, range of motion, and functional ability resulting in improved performance and form.  PATIENT EDUCATION:  Education details: Exercise purpose/form. Self management techniques. Education on diagnosis, prognosis, POC, anatomy and physiology of current condition. Education on HEP including handout. Person educated: Patient Education method: Explanation, Demonstration, and Verbal cues, handouts Education comprehension: verbalized understanding, returned demonstration, and needs further education  HOME EXERCISE PROGRAM: Access Code: [email or web address] URL: [email or web address] Date: 03/09/2024 Prepared by: Camie Cleverly  Exercises - Sitting to Supine Roll  - Half Kneeling Hip Flexor Stretch  - 1 x daily - 1 sets - 3 reps - 45 seconds hold - Supine Single Leg Hip Extension Isometric Into Table  - 1 x daily - 3 sets - 5 reps - 5 seconds hold - Lock Clams  - 1 x daily - 2 sets - 10 reps - 5 seconds hold - 90 seconds time practicing per set  Avoid L shaped positions Lay down on back with knees flexed for 5-10 min periodically throughout day  ASSESSMENT:  CLINICAL IMPRESSION: Progressed patient to more insolation exercises for glute and hip  ER as well as self psoas stretch with good tolerance. However, she did note a little headache by the end of the session. Suggest reminding her to breathe when performing Lock clams. Plan to advance to pallof press next visit as tolerated. Patient given TENS unit recommendation since she got relief from this while at work last night. Patient would benefit from continued management of limiting condition by skilled physical therapist to address remaining impairments and functional limitations to work towards stated goals and return to PLOF or maximal functional independence.     Mechanical sensitivities: load, nerve tension (L > R), extension (possibly multidirectional but other motions only tested under load).    From initial PT evaluation on 02/12/2024:  Patient is a 53 y.o. female referred to outpatient physical therapy with a medical diagnosis of lumbar radiculopathy who presents with signs and symptoms consistent with low back pain with radiation to bilateral LE with mechanical sensitivities to nerve tension, compressive load, and possibly multidirectional ROM. Also likely sensitive to shear forces at the lumbar spine. Patient presents with significant pain, ROM, muscle performance (strength/power/endurance), balance, motor control, knowledge, nerve tension, gait, and activity tolerance impairments that are limiting ability to complete usual activities such as sitting, walking, bed mobility, transfers, bending, dressing, lifting, personal care, traveling, social life, working as a Engineer, civil (consulting), homemaking, sleeping, and leisure without difficulty. Patient will benefit from skilled physical therapy  intervention to address current body structure impairments and activity limitations to improve function and work towards goals set in current POC in order to return to prior level of function or maximal functional improvement.      OBJECTIVE IMPAIRMENTS: Abnormal gait, decreased activity tolerance, decreased balance,  decreased coordination, decreased endurance, decreased knowledge of condition, decreased mobility, difficulty walking, decreased ROM, decreased strength, increased muscle spasms, impaired flexibility, improper body mechanics, obesity, and pain.   ACTIVITY LIMITATIONS: carrying, lifting, bending, sitting, standing, squatting, sleeping, stairs, transfers, bed mobility, bathing, dressing, locomotion level, and caring for others  PARTICIPATION LIMITATIONS: meal prep, cleaning, laundry, interpersonal relationship, driving, shopping, community activity, occupation, yard work, and church  PERSONAL FACTORS: Fitness, Past/current experiences, Time since onset of injury/illness/exacerbation, and 3+ comorbidities:   OSA (obstructive sleep apnea); S/P hysterectomy; Controlled diabetes mellitus type 2 with complications (HCC); Morbid obesity (HCC); Hyperlipidemia associated with type 2 diabetes mellitus (HCC); Microscopic hematuria; Fatty liver; HTN (hypertension); Anxiety; Osteoarthritis of spine with radiculopathy, cervical region; Osteoarthritis of spine with radiculopathy, thoracic region; Cervical spondylosis with myelopathy and radiculopathy; Uveitis; Chronic low back pain; Disorder of right median nerve; History of carpal tunnel surgery of right wrist; Lumbar radiculopathy; Numbness of hand; Numbness of upper limb; Pain in right foot; and Chronic anterior uveitis of left eye on their problem list. she  has a past medical history of Allergy (2012), Anxiety, Arthritis, Asthma, Diabetes mellitus without complication (HCC) (2016), GERD (gastroesophageal reflux disease), Heart murmur, History of kidney stones, Hypertension, Neuromuscular disorder (HCC), PONV (postoperative nausea and vomiting), Shingles, and Sleep apnea. she  has a past surgical history that includes Cesarean section; Spinal fusion (2012); Total abdominal hysterectomy; Diagnostic laparoscopy (2006); Cholecystectomy (N/A, 01/18/2017); Colonoscopy with  propofol  (N/A, 10/13/2020); Anterior cervical decomp/discectomy fusion (N/A, 04/13/2022); Extracorporeal shock wave lithotripsy (Left, 07/12/2022); Cystoscopy/ureteroscopy/holmium laser/stent placement (Left, 08/21/2022); and Carpel Tunnel Surgery (Right, 10/01/2023). are also affecting patient's functional outcome.   REHAB POTENTIAL: Good  CLINICAL DECISION MAKING: Evolving/moderate complexity  EVALUATION COMPLEXITY: Moderate   GOALS: Goals reviewed with patient? Yes  SHORT TERM GOALS: Target date: 02/26/2024  Patient will be independent with initial home exercise program for self-management of symptoms. Baseline: Initial HEP provided at IE (02/12/24); Goal status: In progress   LONG TERM GOALS: Target date: 05/06/2024  Patient will be independent with a long-term home exercise program for self-management of symptoms.  Baseline: Initial HEP provided at IE (02/12/24); Goal status: In progress  2.  Patient will demonstrate improved Modified Oswestry Disability Index (mODI) to equal or less than 10% to demonstrate improvement in overall condition and self-reported functional ability.  Baseline: 56% (02/12/24); Goal status: In progress  3.  Patient will demonstrate the ability to perform sit <> stand and bed mobility without increased pain and without pain behaviars to improve her ability to work and sleep.  Baseline: painful and altered movement pattern (02/12/24); Goal status: In progress  4.  Patient will demonstrate 10 rep heel raise equal bilaterally to demonstrate improved LE function for working, ADLs, and IADLs.  Baseline: unable to do heel raises on R side but able on L (02/12/24); Goal status: In progress  5.  Patient will demonstrate improvement in Patient Specific Functional Scale (PSFS) of equal or greater than 8/10 points to reflect clinically significant improvement in patient's most valued functional activities. Baseline: to be measured at visit 2 as appropriate  (02/12/24); 3.5/10 (02/17/2024);  Goal status: in progress  6.  Patient will report NPRS equal or less than 3/10 during functional  activities during the last 2 weeks to improve their abilitly to complete community, work and/or recreational activities with less limitation. Baseline: up to 10/10 (02/12/24); Goal status: In progress   PLAN:  PT FREQUENCY: 2x/week  PT DURATION: 8-12 weeks  PLANNED INTERVENTIONS: 97164- PT Re-evaluation, 97750- Physical Performance Testing, 97110-Therapeutic exercises, 97530- Therapeutic activity, 97112- Neuromuscular re-education, 97535- Self Care, 02859- Manual therapy, [id number]- Electrical stimulation (unattended), 20560 (1-2 muscles), 20561 (3+ muscles)- Dry Needling, Patient/Family education, Balance training, Joint mobilization, Spinal mobilization, Cryotherapy, and Moist heat.  PLAN FOR NEXT SESSION: update HEP as appropriate, education on mechanical stressors and modifications of activities to avoid them, recover motor control and awareness of modifiers to mechanical sensitivities, retrain motor patterns, regain ROM, improve strength and resilience needed for  performing desired functional performance with appropriate ROM, strength, power, and endurance. Manual therapy as needed.   Camie SAUNDERS. Juli, PT, DPT, Cert. MDT 03/09/24, 12:27 PM  [facility] [address] [place], [place] [address] P: [phone] I F: [phone]

## 2024-03-12 ENCOUNTER — Ambulatory Visit: Admitting: Occupational Therapy

## 2024-03-12 ENCOUNTER — Encounter: Payer: Self-pay | Admitting: Physical Therapy

## 2024-03-12 ENCOUNTER — Ambulatory Visit: Attending: Neurosurgery | Admitting: Physical Therapy

## 2024-03-12 DIAGNOSIS — R208 Other disturbances of skin sensation: Secondary | ICD-10-CM | POA: Insufficient documentation

## 2024-03-12 DIAGNOSIS — L905 Scar conditions and fibrosis of skin: Secondary | ICD-10-CM | POA: Diagnosis present

## 2024-03-12 DIAGNOSIS — M6281 Muscle weakness (generalized): Secondary | ICD-10-CM | POA: Diagnosis present

## 2024-03-12 DIAGNOSIS — M25631 Stiffness of right wrist, not elsewhere classified: Secondary | ICD-10-CM | POA: Diagnosis present

## 2024-03-12 DIAGNOSIS — M5416 Radiculopathy, lumbar region: Secondary | ICD-10-CM | POA: Diagnosis present

## 2024-03-12 DIAGNOSIS — R2689 Other abnormalities of gait and mobility: Secondary | ICD-10-CM | POA: Insufficient documentation

## 2024-03-12 NOTE — Therapy (Signed)
 OUTPATIENT PHYSICAL THERAPY  TREATMENT   Patient Name: Tricia Hill MRN: [id number] DOB:1971/05/23, 53 y.o., female Today's Date: 03/12/2024  END OF SESSION:  PT End of Session - 03/12/24 1121     Visit Number 8    Number of Visits 17    Date for Recertification  05/06/24    Authorization Type [place] [employer] PPO reporting from 02/12/2024    Progress Note Due on Visit 10    PT Start Time 1121    PT Stop Time 1205    PT Time Calculation (min) 44 min    Activity Tolerance Patient limited by pain;Patient tolerated treatment well    Behavior During Therapy [facility] for tasks assessed/performed                 Past Medical History:  Diagnosis Date   Allergy 2012   Anxiety    Arthritis    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   Asthma    Diabetes mellitus without complication (HCC) 2016   GERD (gastroesophageal reflux disease)    Heart murmur    child   History of kidney stones    Hypertension    Neuromuscular disorder (HCC)    right hands tingling and numbness   PONV (postoperative nausea and vomiting)    Shingles    Sleep apnea    USE CPAP   Past Surgical History:  Procedure Laterality Date   ANTERIOR CERVICAL DECOMP/DISCECTOMY FUSION N/A 04/13/2022   Procedure: Anterior Cervical Discectomy Fusion - Cervical seven-Thoracic one removal of plate;  Surgeon: Louis Shove, MD;  Location: [facility] OR;  Service: Neurosurgery;  Laterality: N/A;   Carpel Tunnel Surgery Right 10/01/2023   CESAREAN SECTION     X 2   CHOLECYSTECTOMY N/A 01/18/2017   Procedure: LAPAROSCOPIC CHOLECYSTECTOMY WITH INTRAOPERATIVE CHOLANGIOGRAM;  Surgeon: Dessa Reyes ORN, MD;  Location: [facility] ORS;  Service: General;  Laterality: N/A;   COLONOSCOPY WITH PROPOFOL  N/A 10/13/2020   Procedure: COLONOSCOPY WITH PROPOFOL ;  Surgeon: Unk Corinn Skiff, MD;  Location: [facility] ENDOSCOPY;  Service: Gastroenterology;  Laterality: N/A;  COVID POSITIVE 09/30/2020   CYSTOSCOPY/URETEROSCOPY/HOLMIUM LASER/STENT PLACEMENT  Left 08/21/2022   Procedure: CYSTOSCOPY/URETEROSCOPY/HOLMIUM LASER/STENT PLACEMENT;  Surgeon: Twylla Glendia BROCKS, MD;  Location: [facility] ORS;  Service: Urology;  Laterality: Left;   DIAGNOSTIC LAPAROSCOPY  2006   EXCISION OF ABDOMINAL WALL ENDOMETRIOMA   EXTRACORPOREAL SHOCK WAVE LITHOTRIPSY Left 07/12/2022   Procedure: EXTRACORPOREAL SHOCK WAVE LITHOTRIPSY (ESWL);  Surgeon: Penne Knee, MD;  Location: [facility] ORS;  Service: Urology;  Laterality: Left;   SPINAL FUSION  2012   C3 and C4   TOTAL ABDOMINAL HYSTERECTOMY     Partial   Patient Active Problem List   Diagnosis Date Noted   Chronic anterior uveitis of left eye 02/05/2024   Chronic low back pain 12/03/2023   Disorder of right median nerve 12/03/2023   Lumbar radiculopathy 12/03/2023   Numbness of hand 12/03/2023   History of carpal tunnel surgery of right wrist 10/01/2023   Uveitis 07/11/2023   Numbness of upper limb 05/28/2023   Pain in right foot 05/28/2023   Cervical spondylosis with myelopathy and radiculopathy 04/13/2022   Osteoarthritis of spine with radiculopathy, cervical region 02/08/2022   Osteoarthritis of spine with radiculopathy, thoracic region 02/08/2022   Anxiety 09/05/2021   HTN (hypertension) 04/16/2018   Fatty liver 12/07/2016   Microscopic hematuria 05/26/2015   OSA (obstructive sleep apnea) 01/31/2015   S/P hysterectomy 01/31/2015   Controlled diabetes mellitus type 2 with complications (HCC) 01/31/2015  Morbid obesity (HCC) 01/31/2015   Hyperlipidemia associated with type 2 diabetes mellitus (HCC) 01/31/2015    PCP: Vicci Duwaine SQUIBB, DO  REFERRING PROVIDER: Louis Shove, MD (neurosurgery)  REFERRING DIAG: lumbar radiculopathy  Rationale for Evaluation and Treatment: Rehabilitation  THERAPY DIAG:  Radiculopathy, lumbar region  Muscle weakness (generalized)  Other abnormalities of gait and mobility  ONSET DATE: for > 5 years but legs worse over the last few months, especially since carpal  tunnel surgery  SUBJECTIVE:                                                                                                                                                                                            PERTINENT HISTORY:  Patient is a 53 y.o. female who presents to outpatient physical therapy with a referral for medical diagnosis lumbar radiculopathy. This patient's chief complaints consist of low back and leg pain and leg weakness leading to the following functional deficits: difficulty with usual activities such as sitting, walking, bed mobility, transfers, bending, dressing, lifting, personal care, traveling, social life, working as a Engineer, civil (consulting), homemaking, sleeping, and leisure. Relevant past medical history and comorbidities include the following: she has OSA (obstructive sleep apnea); S/P hysterectomy; Controlled diabetes mellitus type 2 with complications (HCC); Morbid obesity (HCC); Hyperlipidemia associated with type 2 diabetes mellitus (HCC); Microscopic hematuria; Fatty liver; HTN (hypertension); Anxiety; Osteoarthritis of spine with radiculopathy, cervical region; Osteoarthritis of spine with radiculopathy, thoracic region; Cervical spondylosis with myelopathy and radiculopathy; Uveitis; Chronic low back pain; Disorder of right median nerve; History of carpal tunnel surgery of right wrist; Lumbar radiculopathy; Numbness of hand; Numbness of upper limb; Pain in right foot; and Chronic anterior uveitis of left eye on their problem list. she  has a past medical history of Allergy (2012), Anxiety, Arthritis, Asthma, Diabetes mellitus without complication (HCC) (2016), GERD (gastroesophageal reflux disease), Heart murmur, History of kidney stones, Hypertension, Neuromuscular disorder (HCC), PONV (postoperative nausea and vomiting), Shingles, and Sleep apnea. she  has a past surgical history that includes Cesarean section; Spinal fusion (2012); Total abdominal hysterectomy; Diagnostic  laparoscopy (2006); Cholecystectomy (N/A, 01/18/2017); Colonoscopy with propofol  (N/A, 10/13/2020); Anterior cervical decomp/discectomy fusion (N/A, 04/13/2022); Extracorporeal shock wave lithotripsy (Left, 07/12/2022); Cystoscopy/ureteroscopy/holmium laser/stent placement (Left, 08/21/2022); and Carpel Tunnel Surgery (Right, 10/01/2023). Patient denies hx of cancer, stroke, seizures, lung problems, and unexplained stumbling or dropping things. Drops things. Has had 2 neck surgeries but no low back surgeries.   She states she has urinary incontinence and this has worsened over time. She is always changing her underwear or wearing a pad. Usually it is when coughing and sneezing. Also has urgency  and it can start leaking before she gets to the bathroom. It is harder to hold the closer she gets to the toilet.   Exercise history: joined the gym in hopes to lose weight but cannot go because of low back pian. She wanted to do weight lifting.   She is terrified because her mom has similar back problems and cannot do anything now and has sort of given up. Her sister is also going through the same thing with her back. Patient's kids are getting married and she wants to be active and do things with her grandchildren.   SUBJECTIVE STATEMENT: Patient states Monday night her back was really hurting. Tuesday and Wednesday she did not have as much pain but had a lot of pressure in her glutes and low back. The left proximal posterior thigh is still bothering her. She has continued to do her HEP and caught herself losing the pelvic tilt. She had more pain after she slept following last PT session. Getting up from a sitting position has been really hard.   PAIN: NPRS: Current: 4/10 at central low back L posterior thigh.   From initial PT evaluation on 02/12/24:  Best: 0/10, Worst: 10/10. Pain location: Across top of pelvis (was more on R but now more L). This is the starting area. It would ache and get better. Now it is  constantly there. She now has intermittent pain down the back of both thighs to the tops of both feet which can happen together or be one side or the other.  Pain description: Dull, occasionally sharp like a zap behind left knee Aggravating factors: Getting up from sitting, prolonged standing ( ), prolonged walking (10 min), sitting (legs hurt), standing legs and back hurt, sitting with legs propped up in front of her makes her legs hurt more, bending.  Relieving factors: injection partially, sitting can help her back (but makes her legs hurt)   PRECAUTIONS: No lifting of 10# or more due to R hand (recommended by OT so it doesn't damage her neck and arm until she gets more strength in her arm).   WEIGHT BEARING RESTRICTIONS: No  PATIENT GOALS:  I just want the pain to be more bearable. Be able to do some of the things I used to do. Walking for fitness with husband.   OBJECTIVE  TREATMENT                                                                                                                          Neuromuscular Re-education: a technique or exercise performed with the goal of improving the level of communication between the body and the brain, such as for balance, motor control, muscle activation patterns, coordination, desensitization, quality of muscle contraction, proprioception, and/or kinesthetic sense needed for successful and safe completion of functional activities.   Half hooklying PPT/TrA/pelvic floor contraction with isometric hip extension with knee extended, focusing on coordinated movement to activate the lumbar multifidi for improved segmental control 1x10  each side with 5 seconds and ankle over bolster Improved carry over    Manual therapy: to reduce pain and tissue tension, improve range of motion, neuromodulation, in order to promote improved ability to complete functional activities. SIDELYING STM to bilateral QL with sustained holds (very tender, palpable  relaxation and decreased tenderness on L > R)  Educated pt on how to use LAX ball for self mobliization (she tried it on R side and was able to feel similar discomfort to manual pressure from PT).   Therapeutic exercise: therapeutic exercises that incorporate ONE parameter at one or more areas of the body to centralize symptoms, develop strength and endurance, range of motion, and flexibility required for successful completion of functional activities.  Trials of various ways to stretch QL. Ended up with standing version and added to HEP.   Pt required multimodal cuing for proper technique and to facilitate improved neuromuscular control, strength, range of motion, and functional ability resulting in improved performance and form.  PATIENT EDUCATION:  Education details: Exercise purpose/form. Self management techniques. Education on diagnosis, prognosis, POC, anatomy and physiology of current condition. Education on HEP including handout. Person educated: Patient Education method: Explanation, Demonstration, and Verbal cues, handouts Education comprehension: verbalized understanding, returned demonstration, and needs further education  HOME EXERCISE PROGRAM: Access Code: [email or web address] URL: [email or web address] Date: 03/12/2024 Prepared by: Camie Cleverly  Exercises - Sitting to Supine Roll  - Half Kneeling Hip Flexor Stretch  - 1 x daily - 1 sets - 3 reps - 45 seconds hold - Supine Single Leg Hip Extension Isometric Into Table  - 1 x daily - 3 sets - 5 reps - 5 seconds hold - Lock Clams  - 1 x daily - 2 sets - 10 reps - 5 seconds hold - 90 seconds time practicing per set - Seated Flexion Stretch with Swiss Ball  - 1 x daily - 1 sets - 20 reps - 5 seconds hold - Standing Quadratus Lumborum Stretch with Doorway  - 1 x daily - 3 reps - 30 seconds hold -  HOME EXERCISE PROGRAM [[id number]] Quadratus Lumborum (QL) Release -  Perform 1 Times a Day  Avoid L shaped positions Lay down on  back with knees flexed for 5-10 min periodically throughout day  ASSESSMENT:  CLINICAL IMPRESSION: Patient with worse pain since last PT session. Did not perform traction and went back to STM to B QL today since that was not performed last visit. Patient also shown how to perform roll with LAX ball then stretch to QL at home. She had significant tension and tenderness in L > R QL that had palpable decrease with sustained pressure. However, she felt no change in back symptoms by end of session. She did feel some pain in the right side of her head that she associates with lying down for so long. Plan to try more strengthening and motor control exercises next session. Patient would benefit from continued management of limiting condition by skilled physical therapist to address remaining impairments and functional limitations to work towards stated goals and return to PLOF or maximal functional independence.      Mechanical sensitivities: load, nerve tension (L > R), extension (possibly multidirectional but other motions only tested under load).    From initial PT evaluation on 02/12/2024:  Patient is a 53 y.o. female referred to outpatient physical therapy with a medical diagnosis of lumbar radiculopathy who presents with signs and symptoms consistent with low back pain with radiation to bilateral LE with  mechanical sensitivities to nerve tension, compressive load, and possibly multidirectional ROM. Also likely sensitive to shear forces at the lumbar spine. Patient presents with significant pain, ROM, muscle performance (strength/power/endurance), balance, motor control, knowledge, nerve tension, gait, and activity tolerance impairments that are limiting ability to complete usual activities such as sitting, walking, bed mobility, transfers, bending, dressing, lifting, personal care, traveling, social life, working as a Engineer, civil (consulting), homemaking, sleeping, and leisure without difficulty. Patient will benefit from  skilled physical therapy intervention to address current body structure impairments and activity limitations to improve function and work towards goals set in current POC in order to return to prior level of function or maximal functional improvement.      OBJECTIVE IMPAIRMENTS: Abnormal gait, decreased activity tolerance, decreased balance, decreased coordination, decreased endurance, decreased knowledge of condition, decreased mobility, difficulty walking, decreased ROM, decreased strength, increased muscle spasms, impaired flexibility, improper body mechanics, obesity, and pain.   ACTIVITY LIMITATIONS: carrying, lifting, bending, sitting, standing, squatting, sleeping, stairs, transfers, bed mobility, bathing, dressing, locomotion level, and caring for others  PARTICIPATION LIMITATIONS: meal prep, cleaning, laundry, interpersonal relationship, driving, shopping, community activity, occupation, yard work, and church  PERSONAL FACTORS: Fitness, Past/current experiences, Time since onset of injury/illness/exacerbation, and 3+ comorbidities:   OSA (obstructive sleep apnea); S/P hysterectomy; Controlled diabetes mellitus type 2 with complications (HCC); Morbid obesity (HCC); Hyperlipidemia associated with type 2 diabetes mellitus (HCC); Microscopic hematuria; Fatty liver; HTN (hypertension); Anxiety; Osteoarthritis of spine with radiculopathy, cervical region; Osteoarthritis of spine with radiculopathy, thoracic region; Cervical spondylosis with myelopathy and radiculopathy; Uveitis; Chronic low back pain; Disorder of right median nerve; History of carpal tunnel surgery of right wrist; Lumbar radiculopathy; Numbness of hand; Numbness of upper limb; Pain in right foot; and Chronic anterior uveitis of left eye on their problem list. she  has a past medical history of Allergy (2012), Anxiety, Arthritis, Asthma, Diabetes mellitus without complication (HCC) (2016), GERD (gastroesophageal reflux disease), Heart  murmur, History of kidney stones, Hypertension, Neuromuscular disorder (HCC), PONV (postoperative nausea and vomiting), Shingles, and Sleep apnea. she  has a past surgical history that includes Cesarean section; Spinal fusion (2012); Total abdominal hysterectomy; Diagnostic laparoscopy (2006); Cholecystectomy (N/A, 01/18/2017); Colonoscopy with propofol  (N/A, 10/13/2020); Anterior cervical decomp/discectomy fusion (N/A, 04/13/2022); Extracorporeal shock wave lithotripsy (Left, 07/12/2022); Cystoscopy/ureteroscopy/holmium laser/stent placement (Left, 08/21/2022); and Carpel Tunnel Surgery (Right, 10/01/2023). are also affecting patient's functional outcome.   REHAB POTENTIAL: Good  CLINICAL DECISION MAKING: Evolving/moderate complexity  EVALUATION COMPLEXITY: Moderate   GOALS: Goals reviewed with patient? Yes  SHORT TERM GOALS: Target date: 02/26/2024  Patient will be independent with initial home exercise program for self-management of symptoms. Baseline: Initial HEP provided at IE (02/12/24); Goal status: In progress   LONG TERM GOALS: Target date: 05/06/2024  Patient will be independent with a long-term home exercise program for self-management of symptoms.  Baseline: Initial HEP provided at IE (02/12/24); Goal status: In progress  2.  Patient will demonstrate improved Modified Oswestry Disability Index (mODI) to equal or less than 10% to demonstrate improvement in overall condition and self-reported functional ability.  Baseline: 56% (02/12/24); Goal status: In progress  3.  Patient will demonstrate the ability to perform sit <> stand and bed mobility without increased pain and without pain behaviars to improve her ability to work and sleep.  Baseline: painful and altered movement pattern (02/12/24); Goal status: In progress  4.  Patient will demonstrate 10 rep heel raise equal bilaterally to demonstrate improved LE function for working, ADLs, and IADLs.  Baseline: unable to do  heel raises on R side but able on L (02/12/24); Goal status: In progress  5.  Patient will demonstrate improvement in Patient Specific Functional Scale (PSFS) of equal or greater than 8/10 points to reflect clinically significant improvement in patient's most valued functional activities. Baseline: to be measured at visit 2 as appropriate (02/12/24); 3.5/10 (02/17/2024);  Goal status: in progress  6.  Patient will report NPRS equal or less than 3/10 during functional activities during the last 2 weeks to improve their abilitly to complete community, work and/or recreational activities with less limitation. Baseline: up to 10/10 (02/12/24); Goal status: In progress   PLAN:  PT FREQUENCY: 2x/week  PT DURATION: 8-12 weeks  PLANNED INTERVENTIONS: 97164- PT Re-evaluation, 97750- Physical Performance Testing, 97110-Therapeutic exercises, 97530- Therapeutic activity, 97112- Neuromuscular re-education, 97535- Self Care, 02859- Manual therapy, [id number]- Electrical stimulation (unattended), 20560 (1-2 muscles), 20561 (3+ muscles)- Dry Needling, Patient/Family education, Balance training, Joint mobilization, Spinal mobilization, Cryotherapy, and Moist heat.  PLAN FOR NEXT SESSION: update HEP as appropriate, education on mechanical stressors and modifications of activities to avoid them, recover motor control and awareness of modifiers to mechanical sensitivities, retrain motor patterns, regain ROM, improve strength and resilience needed for  performing desired functional performance with appropriate ROM, strength, power, and endurance. Manual therapy as needed.   Camie SAUNDERS. Juli, PT, DPT, Cert. MDT 03/12/24, 12:20 PM  [facility] [address] [place], [place] [address] P: [phone] I F: [id number]

## 2024-03-16 ENCOUNTER — Ambulatory Visit: Admitting: Occupational Therapy

## 2024-03-16 ENCOUNTER — Ambulatory Visit: Admitting: Physical Therapy

## 2024-03-16 DIAGNOSIS — M6281 Muscle weakness (generalized): Secondary | ICD-10-CM

## 2024-03-16 DIAGNOSIS — R2689 Other abnormalities of gait and mobility: Secondary | ICD-10-CM

## 2024-03-16 DIAGNOSIS — M5416 Radiculopathy, lumbar region: Secondary | ICD-10-CM

## 2024-03-16 DIAGNOSIS — L905 Scar conditions and fibrosis of skin: Secondary | ICD-10-CM

## 2024-03-16 NOTE — Therapy (Signed)
 OUTPATIENT PHYSICAL THERAPY  TREATMENT   Patient Name: Tricia Hill MRN: [id number] DOB:1970/07/04, 53 y.o., female Today's Date: 03/17/2024  END OF SESSION:  PT End of Session - 03/16/24 1034     Visit Number 9    Number of Visits 17    Date for Recertification  05/06/24    Authorization Type [place] [employer] PPO reporting from 02/12/2024    Progress Note Due on Visit 10    PT Start Time 1033    PT Stop Time 1111    PT Time Calculation (min) 38 min    Activity Tolerance Patient limited by pain    Behavior During Therapy [facility] for tasks assessed/performed           Past Medical History:  Diagnosis Date   Allergy 2012   Anxiety    Arthritis    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   Asthma    Diabetes mellitus without complication (HCC) 2016   GERD (gastroesophageal reflux disease)    Heart murmur    child   History of kidney stones    Hypertension    Neuromuscular disorder (HCC)    right hands tingling and numbness   PONV (postoperative nausea and vomiting)    Shingles    Sleep apnea    USE CPAP   Past Surgical History:  Procedure Laterality Date   ANTERIOR CERVICAL DECOMP/DISCECTOMY FUSION N/A 04/13/2022   Procedure: Anterior Cervical Discectomy Fusion - Cervical seven-Thoracic one removal of plate;  Surgeon: Louis Shove, MD;  Location: [facility] OR;  Service: Neurosurgery;  Laterality: N/A;   Carpel Tunnel Surgery Right 10/01/2023   CESAREAN SECTION     X 2   CHOLECYSTECTOMY N/A 01/18/2017   Procedure: LAPAROSCOPIC CHOLECYSTECTOMY WITH INTRAOPERATIVE CHOLANGIOGRAM;  Surgeon: Dessa Reyes ORN, MD;  Location: [facility] ORS;  Service: General;  Laterality: N/A;   COLONOSCOPY WITH PROPOFOL  N/A 10/13/2020   Procedure: COLONOSCOPY WITH PROPOFOL ;  Surgeon: Unk Corinn Skiff, MD;  Location: [facility] ENDOSCOPY;  Service: Gastroenterology;  Laterality: N/A;  COVID POSITIVE 09/30/2020   CYSTOSCOPY/URETEROSCOPY/HOLMIUM LASER/STENT PLACEMENT Left 08/21/2022   Procedure:  CYSTOSCOPY/URETEROSCOPY/HOLMIUM LASER/STENT PLACEMENT;  Surgeon: Twylla Glendia BROCKS, MD;  Location: [facility] ORS;  Service: Urology;  Laterality: Left;   DIAGNOSTIC LAPAROSCOPY  2006   EXCISION OF ABDOMINAL WALL ENDOMETRIOMA   EXTRACORPOREAL SHOCK WAVE LITHOTRIPSY Left 07/12/2022   Procedure: EXTRACORPOREAL SHOCK WAVE LITHOTRIPSY (ESWL);  Surgeon: Penne Knee, MD;  Location: [facility] ORS;  Service: Urology;  Laterality: Left;   SPINAL FUSION  2012   C3 and C4   TOTAL ABDOMINAL HYSTERECTOMY     Partial   Patient Active Problem List   Diagnosis Date Noted   Chronic anterior uveitis of left eye 02/05/2024   Chronic low back pain 12/03/2023   Disorder of right median nerve 12/03/2023   Lumbar radiculopathy 12/03/2023   Numbness of hand 12/03/2023   History of carpal tunnel surgery of right wrist 10/01/2023   Uveitis 07/11/2023   Numbness of upper limb 05/28/2023   Pain in right foot 05/28/2023   Cervical spondylosis with myelopathy and radiculopathy 04/13/2022   Osteoarthritis of spine with radiculopathy, cervical region 02/08/2022   Osteoarthritis of spine with radiculopathy, thoracic region 02/08/2022   Anxiety 09/05/2021   HTN (hypertension) 04/16/2018   Fatty liver 12/07/2016   Microscopic hematuria 05/26/2015   OSA (obstructive sleep apnea) 01/31/2015   S/P hysterectomy 01/31/2015   Controlled diabetes mellitus type 2 with complications (HCC) 01/31/2015   Morbid obesity (HCC) 01/31/2015   Hyperlipidemia  associated with type 2 diabetes mellitus (HCC) 01/31/2015    PCP: Vicci Duwaine SQUIBB, DO  REFERRING PROVIDER: Louis Shove, MD (neurosurgery)  REFERRING DIAG: lumbar radiculopathy  Rationale for Evaluation and Treatment: Rehabilitation  THERAPY DIAG:  Radiculopathy, lumbar region  Muscle weakness (generalized)  Other abnormalities of gait and mobility  ONSET DATE: for > 5 years but legs worse over the last few months, especially since carpal tunnel surgery  SUBJECTIVE:                                                                                                                                                                                             PERTINENT HISTORY:  Patient is a 53 y.o. female who presents to outpatient physical therapy with a referral for medical diagnosis lumbar radiculopathy. This patient's chief complaints consist of low back and leg pain and leg weakness leading to the following functional deficits: difficulty with usual activities such as sitting, walking, bed mobility, transfers, bending, dressing, lifting, personal care, traveling, social life, working as a Engineer, civil (consulting), homemaking, sleeping, and leisure. Relevant past medical history and comorbidities include the following: she has OSA (obstructive sleep apnea); S/P hysterectomy; Controlled diabetes mellitus type 2 with complications (HCC); Morbid obesity (HCC); Hyperlipidemia associated with type 2 diabetes mellitus (HCC); Microscopic hematuria; Fatty liver; HTN (hypertension); Anxiety; Osteoarthritis of spine with radiculopathy, cervical region; Osteoarthritis of spine with radiculopathy, thoracic region; Cervical spondylosis with myelopathy and radiculopathy; Uveitis; Chronic low back pain; Disorder of right median nerve; History of carpal tunnel surgery of right wrist; Lumbar radiculopathy; Numbness of hand; Numbness of upper limb; Pain in right foot; and Chronic anterior uveitis of left eye on their problem list. she  has a past medical history of Allergy (2012), Anxiety, Arthritis, Asthma, Diabetes mellitus without complication (HCC) (2016), GERD (gastroesophageal reflux disease), Heart murmur, History of kidney stones, Hypertension, Neuromuscular disorder (HCC), PONV (postoperative nausea and vomiting), Shingles, and Sleep apnea. she  has a past surgical history that includes Cesarean section; Spinal fusion (2012); Total abdominal hysterectomy; Diagnostic laparoscopy (2006); Cholecystectomy (N/A,  01/18/2017); Colonoscopy with propofol  (N/A, 10/13/2020); Anterior cervical decomp/discectomy fusion (N/A, 04/13/2022); Extracorporeal shock wave lithotripsy (Left, 07/12/2022); Cystoscopy/ureteroscopy/holmium laser/stent placement (Left, 08/21/2022); and Carpel Tunnel Surgery (Right, 10/01/2023). Patient denies hx of cancer, stroke, seizures, lung problems, and unexplained stumbling or dropping things. Drops things. Has had 2 neck surgeries but no low back surgeries.   She states she has urinary incontinence and this has worsened over time. She is always changing her underwear or wearing a pad. Usually it is when coughing and sneezing. Also has urgency and it can start leaking before she  gets to the bathroom. It is harder to hold the closer she gets to the toilet.   Exercise history: joined the gym in hopes to lose weight but cannot go because of low back pian. She wanted to do weight lifting.   She is terrified because her mom has similar back problems and cannot do anything now and has sort of given up. Her sister is also going through the same thing with her back. Patient's kids are getting married and she wants to be active and do things with her grandchildren.   SUBJECTIVE STATEMENT: Patient states she got home from last PT session and felt worse in the back of her left thigh. Since then she has had a lot of trouble with increased pain. She worked Friday and Monday night and is limping today. She used the TENS unit Friday night at work but it didn't help as much as the first time she used it. She has not been to bed since working over night and finds standing the most comfortable position, but if she stands too long it gets worse. She tried to do her HEP  (pelvic exercises laying on back, but not as many repetitions). She tried QL stretch on door but could not tolerate it. She also tried to use the LAX ball in her QL but she thought she might be doing more harm than good. She had trouble getting it  into the right place. It was uncomfortable and did not make her feel better afterwards.   PAIN: NPRS: Current: 8/10 at central low back L posterior thigh and lateral knee and proximal lower leg, tops of both feet (both feet same severity).   From initial PT evaluation on 02/12/24:  Best: 0/10, Worst: 10/10. Pain location: Across top of pelvis (was more on R but now more L). This is the starting area. It would ache and get better. Now it is constantly there. She now has intermittent pain down the back of both thighs to the tops of both feet which can happen together or be one side or the other.  Pain description: Dull, occasionally sharp like a zap behind left knee Aggravating factors: Getting up from sitting, prolonged standing ( ), prolonged walking (10 min), sitting (legs hurt), standing legs and back hurt, sitting with legs propped up in front of her makes her legs hurt more, bending.  Relieving factors: injection partially, sitting can help her back (but makes her legs hurt)   PRECAUTIONS: No lifting of 10# or more due to R hand (recommended by OT so it doesn't damage her neck and arm until she gets more strength in her arm).   WEIGHT BEARING RESTRICTIONS: No  PATIENT GOALS:  I just want the pain to be more bearable. Be able to do some of the things I used to do. Walking for fitness with husband.   OBJECTIVE  TREATMENT  Therapeutic exercise: therapeutic exercises that incorporate ONE parameter at one or more areas of the body to centralize symptoms, develop strength and endurance, range of motion, and flexibility required for successful completion of functional activities.  Sidelying knees to chest thoracic rotation with cervical spine rotation with hand on chest, actively retracting scapula for active stretch of thoracic spine.  1x10 each side rotating with exhale   Pillow between knees  Attempted seated lumbar flexion roll out with theraball, but increased pain during first 2 reps for discontinued  Attempted standing lumbar extension, but discontinued since it was painful with 2 reps  Reclined hookling slight PPT with TrA  2x 90 seconds with 10 second holds Pain free range Suggested for current HEP   Manual therapy: to reduce pain and tissue tension, improve range of motion, neuromodulation, in order to promote improved ability to complete functional activities. HOOKLYING Intermittent manual lumbar traction with belt around back of knees 10x10 seconds on/off  SIDELYING STM to L QL and L lumbar paraspinals as tolerated intermixed with PROM stretch with contract relax  Pt required multimodal cuing for proper technique and to facilitate improved neuromuscular control, strength, range of motion, and functional ability resulting in improved performance and form.  PATIENT EDUCATION:  Education details: Exercise purpose/form. Self management techniques. Education on diagnosis, prognosis, POC, anatomy and physiology of current condition. Education on HEP including handout. Person educated: Patient Education method: Explanation, Demonstration, and Verbal cues, handouts Education comprehension: verbalized understanding, returned demonstration, and needs further education  HOME EXERCISE PROGRAM: Access Code: [email or web address] URL: [email or web address] Date: 03/12/2024 Prepared by: Camie Cleverly  Exercises - Sitting to Supine Roll  - Half Kneeling Hip Flexor Stretch  - 1 x daily - 1 sets - 3 reps - 45 seconds hold - Supine Single Leg Hip Extension Isometric Into Table  - 1 x daily - 3 sets - 5 reps - 5 seconds hold - Lock Clams  - 1 x daily - 2 sets - 10 reps - 5 seconds hold - 90 seconds time practicing per set - Seated Flexion Stretch with Swiss Ball  - 1 x daily - 1 sets - 20 reps - 5 seconds hold - Standing Quadratus Lumborum Stretch with  Doorway  - 1 x daily - 3 reps - 30 seconds hold -  HOME EXERCISE PROGRAM [[id number]] Quadratus Lumborum (QL) Release -  Perform 1 Times a Day  Avoid L shaped positions Lay down on back with knees flexed for 5-10 min periodically throughout day  ASSESSMENT:  CLINICAL IMPRESSION: Patient with worsening in the L > R leg since last PT session. Returned to intermittent manual traction, although she does feel this as increased pain slightly during pull (but not during release/rest). Continued working on L QL muscle as tolerated which continues to be sensitive and tight. Less sustained holds this session and more muscle bending and contract-relax. Pt unable to tolerate lumbar flexion roll out that felt better before and also still intolerant to extension. She did well with very gentle reclined PPT and TrA 10 second isometrics. Advised her to complete this as her HEP while her pain is flared. She is showing a pattern of aggravation from work activities, and also reports systemic worsening of symptoms everywhere suggesting an increased allostatic load contributing to symptoms. She did report feeling slight improvement following PT session but continues to ambulate with antalgic and slow gait.  Patient would benefit from continued management of limiting condition by skilled physical therapist to address remaining impairments and functional  limitations to work towards stated goals and return to PLOF or maximal functional independence.   Mechanical sensitivities: load, nerve tension (L > R), extension (possibly multidirectional but other motions only tested under load).    From initial PT evaluation on 02/12/2024:  Patient is a 53 y.o. female referred to outpatient physical therapy with a medical diagnosis of lumbar radiculopathy who presents with signs and symptoms consistent with low back pain with radiation to bilateral LE with mechanical sensitivities to nerve tension, compressive load, and possibly  multidirectional ROM. Also likely sensitive to shear forces at the lumbar spine. Patient presents with significant pain, ROM, muscle performance (strength/power/endurance), balance, motor control, knowledge, nerve tension, gait, and activity tolerance impairments that are limiting ability to complete usual activities such as sitting, walking, bed mobility, transfers, bending, dressing, lifting, personal care, traveling, social life, working as a Engineer, civil (consulting), homemaking, sleeping, and leisure without difficulty. Patient will benefit from skilled physical therapy intervention to address current body structure impairments and activity limitations to improve function and work towards goals set in current POC in order to return to prior level of function or maximal functional improvement.      OBJECTIVE IMPAIRMENTS: Abnormal gait, decreased activity tolerance, decreased balance, decreased coordination, decreased endurance, decreased knowledge of condition, decreased mobility, difficulty walking, decreased ROM, decreased strength, increased muscle spasms, impaired flexibility, improper body mechanics, obesity, and pain.   ACTIVITY LIMITATIONS: carrying, lifting, bending, sitting, standing, squatting, sleeping, stairs, transfers, bed mobility, bathing, dressing, locomotion level, and caring for others  PARTICIPATION LIMITATIONS: meal prep, cleaning, laundry, interpersonal relationship, driving, shopping, community activity, occupation, yard work, and church  PERSONAL FACTORS: Fitness, Past/current experiences, Time since onset of injury/illness/exacerbation, and 3+ comorbidities:   OSA (obstructive sleep apnea); S/P hysterectomy; Controlled diabetes mellitus type 2 with complications (HCC); Morbid obesity (HCC); Hyperlipidemia associated with type 2 diabetes mellitus (HCC); Microscopic hematuria; Fatty liver; HTN (hypertension); Anxiety; Osteoarthritis of spine with radiculopathy, cervical region; Osteoarthritis of  spine with radiculopathy, thoracic region; Cervical spondylosis with myelopathy and radiculopathy; Uveitis; Chronic low back pain; Disorder of right median nerve; History of carpal tunnel surgery of right wrist; Lumbar radiculopathy; Numbness of hand; Numbness of upper limb; Pain in right foot; and Chronic anterior uveitis of left eye on their problem list. she  has a past medical history of Allergy (2012), Anxiety, Arthritis, Asthma, Diabetes mellitus without complication (HCC) (2016), GERD (gastroesophageal reflux disease), Heart murmur, History of kidney stones, Hypertension, Neuromuscular disorder (HCC), PONV (postoperative nausea and vomiting), Shingles, and Sleep apnea. she  has a past surgical history that includes Cesarean section; Spinal fusion (2012); Total abdominal hysterectomy; Diagnostic laparoscopy (2006); Cholecystectomy (N/A, 01/18/2017); Colonoscopy with propofol  (N/A, 10/13/2020); Anterior cervical decomp/discectomy fusion (N/A, 04/13/2022); Extracorporeal shock wave lithotripsy (Left, 07/12/2022); Cystoscopy/ureteroscopy/holmium laser/stent placement (Left, 08/21/2022); and Carpel Tunnel Surgery (Right, 10/01/2023). are also affecting patient's functional outcome.   REHAB POTENTIAL: Good  CLINICAL DECISION MAKING: Evolving/moderate complexity  EVALUATION COMPLEXITY: Moderate   GOALS: Goals reviewed with patient? Yes  SHORT TERM GOALS: Target date: 02/26/2024  Patient will be independent with initial home exercise program for self-management of symptoms. Baseline: Initial HEP provided at IE (02/12/24); Goal status: In progress   LONG TERM GOALS: Target date: 05/06/2024  Patient will be independent with a long-term home exercise program for self-management of symptoms.  Baseline: Initial HEP provided at IE (02/12/24); Goal status: In progress  2.  Patient will demonstrate improved Modified Oswestry Disability Index (mODI) to equal or less than 10% to demonstrate improvement  in  overall condition and self-reported functional ability.  Baseline: 56% (02/12/24); Goal status: In progress  3.  Patient will demonstrate the ability to perform sit <> stand and bed mobility without increased pain and without pain behaviars to improve her ability to work and sleep.  Baseline: painful and altered movement pattern (02/12/24); Goal status: In progress  4.  Patient will demonstrate 10 rep heel raise equal bilaterally to demonstrate improved LE function for working, ADLs, and IADLs.  Baseline: unable to do heel raises on R side but able on L (02/12/24); Goal status: In progress  5.  Patient will demonstrate improvement in Patient Specific Functional Scale (PSFS) of equal or greater than 8/10 points to reflect clinically significant improvement in patient's most valued functional activities. Baseline: to be measured at visit 2 as appropriate (02/12/24); 3.5/10 (02/17/2024);  Goal status: in progress  6.  Patient will report NPRS equal or less than 3/10 during functional activities during the last 2 weeks to improve their abilitly to complete community, work and/or recreational activities with less limitation. Baseline: up to 10/10 (02/12/24); Goal status: In progress   PLAN:  PT FREQUENCY: 2x/week  PT DURATION: 8-12 weeks  PLANNED INTERVENTIONS: 97164- PT Re-evaluation, 97750- Physical Performance Testing, 97110-Therapeutic exercises, 97530- Therapeutic activity, 97112- Neuromuscular re-education, 97535- Self Care, 02859- Manual therapy, [id number]- Electrical stimulation (unattended), 20560 (1-2 muscles), 20561 (3+ muscles)- Dry Needling, Patient/Family education, Balance training, Joint mobilization, Spinal mobilization, Cryotherapy, and Moist heat.  PLAN FOR NEXT SESSION: update HEP as appropriate, education on mechanical stressors and modifications of activities to avoid them, recover motor control and awareness of modifiers to mechanical sensitivities, retrain motor patterns,  regain ROM, improve strength and resilience needed for  performing desired functional performance with appropriate ROM, strength, power, and endurance. Manual therapy as needed.   Camie SAUNDERS. Juli, PT, DPT, Cert. MDT 03/17/24, 1:04 PM  [facility] [address] [place], [place] [address] P: [phone] I F: [phone]

## 2024-03-16 NOTE — Therapy (Signed)
 OUTPATIENT OCCUPATIONAL THERAPY ORTHO TREATMENT  Patient Name: Tricia Hill MRN: [id number] DOB:[birth year], 53 y.o., female Today's Date: 03/16/2024  PCP: Dr Vicci MART PROVIDER: Dr Louis  END OF SESSION:  OT End of Session - 03/16/24 0949     Visit Number 7    Number of Visits 12    Date for Recertification  03/24/24    OT Start Time 0949    OT Stop Time 1030    OT Time Calculation (min) 41 min    Activity Tolerance Patient tolerated treatment well    Behavior During Therapy [facility] for tasks assessed/performed          Past Medical History:  Diagnosis Date   Allergy 2012   Anxiety    Arthritis    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   Asthma    Diabetes mellitus without complication (HCC) 2016   GERD (gastroesophageal reflux disease)    Heart murmur    child   History of kidney stones    Hypertension    Neuromuscular disorder (HCC)    right hands tingling and numbness   PONV (postoperative nausea and vomiting)    Shingles    Sleep apnea    USE CPAP   Past Surgical History:  Procedure Laterality Date   ANTERIOR CERVICAL DECOMP/DISCECTOMY FUSION N/A 04/13/2022   Procedure: Anterior Cervical Discectomy Fusion - Cervical seven-Thoracic one removal of plate;  Surgeon: Louis Shove, MD;  Location: [facility] OR;  Service: Neurosurgery;  Laterality: N/A;   Carpel Tunnel Surgery Right 10/01/2023   CESAREAN SECTION     X 2   CHOLECYSTECTOMY N/A 01/18/2017   Procedure: LAPAROSCOPIC CHOLECYSTECTOMY WITH INTRAOPERATIVE CHOLANGIOGRAM;  Surgeon: Dessa Reyes ORN, MD;  Location: [facility] ORS;  Service: General;  Laterality: N/A;   COLONOSCOPY WITH PROPOFOL  N/A 10/13/2020   Procedure: COLONOSCOPY WITH PROPOFOL ;  Surgeon: Unk Corinn Skiff, MD;  Location: [facility] ENDOSCOPY;  Service: Gastroenterology;  Laterality: N/A;  COVID POSITIVE 09/30/2020   CYSTOSCOPY/URETEROSCOPY/HOLMIUM LASER/STENT PLACEMENT Left 08/21/2022   Procedure: CYSTOSCOPY/URETEROSCOPY/HOLMIUM LASER/STENT PLACEMENT;   Surgeon: Twylla Glendia BROCKS, MD;  Location: [facility] ORS;  Service: Urology;  Laterality: Left;   DIAGNOSTIC LAPAROSCOPY  2006   EXCISION OF ABDOMINAL WALL ENDOMETRIOMA   EXTRACORPOREAL SHOCK WAVE LITHOTRIPSY Left 07/12/2022   Procedure: EXTRACORPOREAL SHOCK WAVE LITHOTRIPSY (ESWL);  Surgeon: Penne Knee, MD;  Location: [facility] ORS;  Service: Urology;  Laterality: Left;   SPINAL FUSION  2012   C3 and C4   TOTAL ABDOMINAL HYSTERECTOMY     Partial   Patient Active Problem List   Diagnosis Date Noted   Chronic anterior uveitis of left eye 02/05/2024   Chronic low back pain 12/03/2023   Disorder of right median nerve 12/03/2023   Lumbar radiculopathy 12/03/2023   Numbness of hand 12/03/2023   History of carpal tunnel surgery of right wrist 10/01/2023   Uveitis 07/11/2023   Numbness of upper limb 05/28/2023   Pain in right foot 05/28/2023   Cervical spondylosis with myelopathy and radiculopathy 04/13/2022   Osteoarthritis of spine with radiculopathy, cervical region 02/08/2022   Osteoarthritis of spine with radiculopathy, thoracic region 02/08/2022   Anxiety 09/05/2021   HTN (hypertension) 04/16/2018   Fatty liver 12/07/2016   Microscopic hematuria 05/26/2015   OSA (obstructive sleep apnea) 01/31/2015   S/P hysterectomy 01/31/2015   Controlled diabetes mellitus type 2 with complications (HCC) 01/31/2015   Morbid obesity (HCC) 01/31/2015   Hyperlipidemia associated with type 2 diabetes mellitus (HCC) 01/31/2015    ONSET DATE: 10/01/23  REFERRING DIAG: R CTR  THERAPY DIAG:  Radiculopathy, lumbar region  Muscle weakness (generalized)  Scar tissue  Rationale for Evaluation and Treatment: Rehabilitation  SUBJECTIVE:   SUBJECTIVE STATEMENT:I have been hurting more since Friday - I worked Friday and Sunday night - leg, back - my hand too   PERTINENT HISTORY: DR Louis note: The patient returns today in follow-up. She continues to have some right him discomfort as she recover some  carpal tunnel surgery. She has not started her physical therapy. She also notes persistent posterior cervical pain with some radiation. She is having no definite weakness. Her lumbar pain is stable. She did not get much improvement following recent epidural steroid injection   PRECAUTIONS: None    WEIGHT BEARING RESTRICTIONS: No  PAIN:  Are you having pain? 2/10 dorsal thumb as well as hypothenar eminence sore -and pain into volar wrist  FALLS: Has patient fallen in last 6 months? 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  But not at the moment on medical leave.  Does house stuff around with cooking and cleaning and laundry.  On her phone.  Reads some.  Has 2 grownup kids and a 69 -year-old-does a little flowers  PATIENT GOALS: I just want the numbness better and the pain but then also more strength so I can do my job as a Engineer, civil (consulting)  NEXT MD VISIT: 21 August  OBJECTIVE:  Note: Objective measures were completed at Evaluation unless otherwise noted.  HAND DOMINANCE: Right  ADLs: Difficulty with cutting food, cooking doing laundry.  Putting on jewelry, do buttons, opening jars or packages or Ziploc bags: Cannot grip or hold things I drop things  FUNCTIONAL OUTCOME MEASURES: To be done excision  UPPER EXTREMITY ROM:     Active ROM Right eval Left eval  Shoulder flexion    Shoulder abduction    Shoulder adduction    Shoulder extension    Shoulder internal rotation    Shoulder external rotation    Elbow flexion    Elbow extension    Wrist flexion 70   Wrist extension 64   Wrist ulnar deviation    Wrist radial deviation    Wrist pronation    Wrist supination    (Blank rows = not tested)  Active ROM Right eval Left eval  Thumb MCP (0-60)    Thumb IP (0-80)    Thumb Radial abd/add (0-55)     Thumb Palmar abd/add (0-45)     Thumb Opposition to Small Finger     Index MCP (0-90)     Index PIP  (0-100)     Index DIP (0-70)      Long MCP (0-90)      Long PIP (0-100)      Long DIP (0-70)      Ring MCP (0-90)      Ring PIP (0-100)      Ring DIP (0-70)      Little MCP (0-90)      Little PIP (0-100)      Little DIP (0-70)      Digit flexion within normal limits but tight when attempting to make a fist. Opposition within normal limits. Thumb palmar radial abduction within normal limits  HAND FUNCTION: Grip strength: Right: 27 lbs; Left: 50 lbs, Lateral pinch: Right: 4 lbs, Left: 12 lbs, and 3 point pinch: Right: 6 lbs, Left: 14 lbs 02/06/24: Grip strength: Right: 29  lbs; Left: 50 lbs, Lateral pinch: Right: 4 lbs, Left: 12 lbs, and 3 point pinch: Right: 6 lbs, Left: 14 lbs  02/11/24: Grip strength: Right: 42 lbs; Left: 50 lbs, Lateral pinch: Right: 5 lbs, Left: 12 lbs, and 3 point pinch: Right: 7 lbs, Left: 14 lbs 03/03/24: Grip strength: Right: 44 lbs; Left: 50 lbs, Lateral pinch: Right: 6 lbs, Left: 12 lbs, and 3 point pinch: Right: 7 lbs, Left: 14 lbs COORDINATION: Decrease in limited because of numbness in digits at times  SENSATION: Patient report increased numbness from thumb through fourth digit on and off during the day and night  EDEMA: Not noticeable.  COGNITION: Overall cognitive status: Within functional limits for tasks assessed      TREATMENT DATE: 03/16/24                                                                                                                             Patient return after doing some home exercises for a week.  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  Doing a lot of writing and on the computer.  Helping the nurses with medication. Reports increased pain over the thumb thenar eminence as well as into the volar wrist over the weekend.  As well as soreness over the ulnar side of the hand. Patient continues to have decreased strength in ECR as well as supination pronation Upon assessment patient's grip and prehension about the same.  With  increased pain over the thumb thenar eminence  Patient tender at thumb CMC.  Fitted patient this date on a prefab thumb CMC neoprene wrap to use during the day with activities that causes pain at the thumb.   Patient continues to show when she does a grip or lateral pinch patient ulnar deviate forearm unable to maintain midline.   Patient continues to do scar massage and daughter assist with soft tissue mobilization Paraffin bath done 8 minutes to decrease pain in right hand and wrist prior to soft tissue mobilization Focus this date on metacarpal spreads-webspace as well as volar forearm Done dry static light pressure cupping over volar forearm and hand with 10 repetitions of digit and wrist extension repeated 3 times   Continue with: tendon glides 12 reps Patient to continue with rubber band for resistance for palmar radial abduction 3 sets of 12 2 times a day.  Patient have to focus to maintain position of palmar abduction Patient to hold off on wrist plate As well as putty home exercises   Focus only on thumb palmar radial abduction as well as tendon glides At opposition to all digits picking up 1 cm object as well as picking up objects with interossei digits adduction abduction 10 reps to 3 times a day     PATIENT EDUCATION: Education details: findings of eval and HEP  Person educated: Patient Education method: Explanation, Demonstration, Tactile cues, Verbal cues, and Handouts Education comprehension: verbalized understanding, returned demonstration, verbal cues required,  and needs further education   GOALS: Goals reviewed with patient? Yes LONG TERM GOALS: Target date: 8 weeks  Patient to be independent in home program to decrease scar tissue and tenderness to report decrease stiffness tightness and digit range of motion as well as individually tendon glides Baseline: Increased tightness and stiffness in digit flexion.  Scar thick and adhere as well as tender and pain when  hitting no knowledge of scar mobilization and massage Goal status: INITIAL  2.  Wrist active range of motion improved to within normal limits symptom-free today able to push and pull heavy door. Baseline: Patient report pain and tenderness over carpal tunnel scar with pressure or hitting it as well as decreased wrist flexion extension unable to push or pull with hand has increased symptoms in the wrist and forearm Goal status: INITIAL  3.  Grip and prehension strength in the right improved to within normal range for her age for patient to be able to turn doorknob, cut with a knife, open Ziploc bags and do buttons symptom-free Baseline: Right grip 27 pounds left 50 pounds, lateral pinch right 4 pounds left 12 pounds and 3 point pinch right 6 pounds left 14 pounds Goal status: INITIAL  4.  Patient function on PRWHE improved with more than 10 points in ADLs and IADLs Baseline:  PRWHE score to be done next session Goal status: INITIAL   ASSESSMENT:  CLINICAL IMPRESSION: Continues to wear brace at nighttime, reports mild numbness to R 1-3 digits and pain with driving. Patient present at OT evaluation with increased scar tissue with increased tenderness and pain in palm to volar forearm with use.  Patient made great progress from start of care and wrist range of motion and strength as well as grip and pinch strength.  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  Writing and on the computer as well as distributing medication.  Patient made great progress from start of care but continued to show decreased radial deviation and supination pronation.  Unable to maintain midline with gripping.  Patient grip and prehension improved from start of care.  But continued to be limited mostly in her prehension.  Last time upgraded her home exercises for strengthening for the forearm as well as putty for grip and prehension.  Patient arrived this date with increased pain on  thumb thenar eminence as well as volar wrist and increased soreness in the ulnar hand.  Change patient's home program to hold off on any strengthening to only active range of motion but including interosseous digit adduction and abduction as well as opposition.  And fitted patient with a CMC neoprene wrap to use during the day with activities that cause pain and discomfort on the thumb thenar eminence and volar wrist. Patient can benefit from skilled OT services to decrease scar tissue, pain and stiffness and increased motion and strength to return to prior level of function.  Patient did had a MRI to her cervical spine and is meeting with Dr. Louis this Thursday to review results.  PERFORMANCE DEFICITS: in functional skills including ADLs, IADLs, ROM, strength, pain, flexibility, decreased knowledge of use of DME, and UE functional use,   and psychosocial skills including environmental adaptation and routines and behaviors.   IMPAIRMENTS: are limiting patient from ADLs, IADLs, rest and sleep, play, leisure, and social participation.   COMORBIDITIES: has no other co-morbidities that affects occupational performance. Patient will benefit from skilled OT to address above impairments  and improve overall function.  MODIFICATION OR ASSISTANCE TO COMPLETE EVALUATION: No modification of tasks or assist necessary to complete an evaluation.  OT OCCUPATIONAL PROFILE AND HISTORY: Problem focused assessment: Including review of records relating to presenting problem.  CLINICAL DECISION MAKING: LOW - limited treatment options, no task modification necessary  REHAB POTENTIAL: Good for goals  EVALUATION COMPLEXITY: Low      PLAN:  OT FREQUENCY: 1-2x/week  OT DURATION: 6 weeks  PLANNED INTERVENTIONS: 97168 OT Re-evaluation, 97535 self care/ADL training, 02889 therapeutic exercise, 97530 therapeutic activity, 97112 neuromuscular re-education, 97140 manual therapy, 97035 ultrasound, 97018 paraffin, 02960  fluidotherapy, 97034 contrast bath, scar mobilization, passive range of motion, patient/family education, and DME and/or AE instructions    CONSULTED AND AGREED WITH PLAN OF CARE: Patient    Ancel Peters, OTR/L,CLT 03/16/2024, 1:09 PM

## 2024-03-17 ENCOUNTER — Encounter: Payer: Self-pay | Admitting: Physical Therapy

## 2024-03-18 ENCOUNTER — Ambulatory Visit: Admitting: Physical Therapy

## 2024-03-20 ENCOUNTER — Other Ambulatory Visit

## 2024-03-23 ENCOUNTER — Ambulatory Visit: Admitting: Physical Therapy

## 2024-03-23 DIAGNOSIS — M5416 Radiculopathy, lumbar region: Secondary | ICD-10-CM | POA: Diagnosis not present

## 2024-03-23 DIAGNOSIS — M6281 Muscle weakness (generalized): Secondary | ICD-10-CM

## 2024-03-23 NOTE — Therapy (Signed)
 OUTPATIENT PHYSICAL THERAPY PROGRESS NOTE    Dates of Reporting:  02/12/24-  03/23/24     Patient Name: Tricia Hill MRN: [id number] DOB:February 06, 1971, 53 y.o., female Today's Date: 03/23/2024  END OF SESSION:  PT End of Session - 03/23/24 1036     Visit Number 10    Number of Visits 17    Date for Recertification  05/06/24    Authorization Type [place] [employer] PPO reporting from 02/12/2024    Authorization - Visit Number 10    Authorization - Number of Visits 17    Progress Note Due on Visit 10    PT Start Time 1030    PT Stop Time 1115    PT Time Calculation (min) 45 min    Activity Tolerance Patient limited by pain    Behavior During Therapy [facility] for tasks assessed/performed            Past Medical History:  Diagnosis Date   Allergy 2012   Anxiety    Arthritis    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   Asthma    Diabetes mellitus without complication (HCC) 2016   GERD (gastroesophageal reflux disease)    Heart murmur    child   History of kidney stones    Hypertension    Neuromuscular disorder (HCC)    right hands tingling and numbness   PONV (postoperative nausea and vomiting)    Shingles    Sleep apnea    USE CPAP   Past Surgical History:  Procedure Laterality Date   ANTERIOR CERVICAL DECOMP/DISCECTOMY FUSION N/A 04/13/2022   Procedure: Anterior Cervical Discectomy Fusion - Cervical seven-Thoracic one removal of plate;  Surgeon: Louis Shove, MD;  Location: [facility] OR;  Service: Neurosurgery;  Laterality: N/A;   Carpel Tunnel Surgery Right 10/01/2023   CESAREAN SECTION     X 2   CHOLECYSTECTOMY N/A 01/18/2017   Procedure: LAPAROSCOPIC CHOLECYSTECTOMY WITH INTRAOPERATIVE CHOLANGIOGRAM;  Surgeon: Dessa Reyes ORN, MD;  Location: [facility] ORS;  Service: General;  Laterality: N/A;   COLONOSCOPY WITH PROPOFOL  N/A 10/13/2020   Procedure: COLONOSCOPY WITH PROPOFOL ;  Surgeon: Unk Corinn Skiff, MD;  Location: [facility] ENDOSCOPY;  Service: Gastroenterology;  Laterality: N/A;   COVID POSITIVE 09/30/2020   CYSTOSCOPY/URETEROSCOPY/HOLMIUM LASER/STENT PLACEMENT Left 08/21/2022   Procedure: CYSTOSCOPY/URETEROSCOPY/HOLMIUM LASER/STENT PLACEMENT;  Surgeon: Twylla Glendia BROCKS, MD;  Location: [facility] ORS;  Service: Urology;  Laterality: Left;   DIAGNOSTIC LAPAROSCOPY  2006   EXCISION OF ABDOMINAL WALL ENDOMETRIOMA   EXTRACORPOREAL SHOCK WAVE LITHOTRIPSY Left 07/12/2022   Procedure: EXTRACORPOREAL SHOCK WAVE LITHOTRIPSY (ESWL);  Surgeon: Penne Knee, MD;  Location: [facility] ORS;  Service: Urology;  Laterality: Left;   SPINAL FUSION  2012   C3 and C4   TOTAL ABDOMINAL HYSTERECTOMY     Partial   Patient Active Problem List   Diagnosis Date Noted   Chronic anterior uveitis of left eye 02/05/2024   Chronic low back pain 12/03/2023   Disorder of right median nerve 12/03/2023   Lumbar radiculopathy 12/03/2023   Numbness of hand 12/03/2023   History of carpal tunnel surgery of right wrist 10/01/2023   Uveitis 07/11/2023   Numbness of upper limb 05/28/2023   Pain in right foot 05/28/2023   Cervical spondylosis with myelopathy and radiculopathy 04/13/2022   Osteoarthritis of spine with radiculopathy, cervical region 02/08/2022   Osteoarthritis of spine with radiculopathy, thoracic region 02/08/2022   Anxiety 09/05/2021   HTN (hypertension) 04/16/2018   Fatty liver 12/07/2016   Microscopic hematuria 05/26/2015  OSA (obstructive sleep apnea) 01/31/2015   S/P hysterectomy 01/31/2015   Controlled diabetes mellitus type 2 with complications (HCC) 01/31/2015   Morbid obesity (HCC) 01/31/2015   Hyperlipidemia associated with type 2 diabetes mellitus (HCC) 01/31/2015    PCP: Vicci Duwaine SQUIBB, DO  REFERRING PROVIDER: Louis Shove, MD (neurosurgery)  REFERRING DIAG: lumbar radiculopathy  Rationale for Evaluation and Treatment: Rehabilitation  THERAPY DIAG:  Radiculopathy, lumbar region  Muscle weakness (generalized)  ONSET DATE: for > 5 years but legs worse over the last  few months, especially since carpal tunnel surgery  SUBJECTIVE:                                                                                                                                                                                            PERTINENT HISTORY:  Patient is a 53 y.o. female who presents to outpatient physical therapy with a referral for medical diagnosis lumbar radiculopathy. This patient's chief complaints consist of low back and leg pain and leg weakness leading to the following functional deficits: difficulty with usual activities such as sitting, walking, bed mobility, transfers, bending, dressing, lifting, personal care, traveling, social life, working as a Engineer, civil (consulting), homemaking, sleeping, and leisure. Relevant past medical history and comorbidities include the following: she has OSA (obstructive sleep apnea); S/P hysterectomy; Controlled diabetes mellitus type 2 with complications (HCC); Morbid obesity (HCC); Hyperlipidemia associated with type 2 diabetes mellitus (HCC); Microscopic hematuria; Fatty liver; HTN (hypertension); Anxiety; Osteoarthritis of spine with radiculopathy, cervical region; Osteoarthritis of spine with radiculopathy, thoracic region; Cervical spondylosis with myelopathy and radiculopathy; Uveitis; Chronic low back pain; Disorder of right median nerve; History of carpal tunnel surgery of right wrist; Lumbar radiculopathy; Numbness of hand; Numbness of upper limb; Pain in right foot; and Chronic anterior uveitis of left eye on their problem list. she  has a past medical history of Allergy (2012), Anxiety, Arthritis, Asthma, Diabetes mellitus without complication (HCC) (2016), GERD (gastroesophageal reflux disease), Heart murmur, History of kidney stones, Hypertension, Neuromuscular disorder (HCC), PONV (postoperative nausea and vomiting), Shingles, and Sleep apnea. she  has a past surgical history that includes Cesarean section; Spinal fusion (2012); Total abdominal  hysterectomy; Diagnostic laparoscopy (2006); Cholecystectomy (N/A, 01/18/2017); Colonoscopy with propofol  (N/A, 10/13/2020); Anterior cervical decomp/discectomy fusion (N/A, 04/13/2022); Extracorporeal shock wave lithotripsy (Left, 07/12/2022); Cystoscopy/ureteroscopy/holmium laser/stent placement (Left, 08/21/2022); and Carpel Tunnel Surgery (Right, 10/01/2023). Patient denies hx of cancer, stroke, seizures, lung problems, and unexplained stumbling or dropping things. Drops things. Has had 2 neck surgeries but no low back surgeries.   She states she has urinary incontinence and this has worsened over time. She is always changing  her underwear or wearing a pad. Usually it is when coughing and sneezing. Also has urgency and it can start leaking before she gets to the bathroom. It is harder to hold the closer she gets to the toilet.   Exercise history: joined the gym in hopes to lose weight but cannot go because of low back pian. She wanted to do weight lifting.   She is terrified because her mom has similar back problems and cannot do anything now and has sort of given up. Her sister is also going through the same thing with her back. Patient's kids are getting married and she wants to be active and do things with her grandchildren.   SUBJECTIVE STATEMENT: Pt reports that she is having increased low back pain with right sided radicular pain with radiation right hamstring. She describes that it always feels worse after work. Pt reports using TENS at work and that she found it helpful. She has not yet used it at physical therapy.      PAIN: NPRS: Current: 8/10 at central low back L posterior thigh and lateral knee and proximal lower leg, tops of both feet (both feet same severity).   From initial PT evaluation on 02/12/24:  Best: 7/10 Right Side Low Back Pain  Pain location: Across top of pelvis (was more on R but now more L). This is the starting area. It would ache and get better. Now it is constantly  there. She now has intermittent pain down the back of both thighs to the tops of both feet which can happen together or be one side or the other.  Pain description: Dull, occasionally sharp like a zap behind left knee Aggravating factors: Getting up from sitting, prolonged standing ( ), prolonged walking (10 min), sitting (legs hurt), standing legs and back hurt, sitting with legs propped up in front of her makes her legs hurt more, bending.  Relieving factors: injection partially, sitting can help her back (but makes her legs hurt)   PRECAUTIONS: No lifting of 10# or more due to R hand (recommended by OT so it doesn't damage her neck and arm until she gets more strength in her arm).   WEIGHT BEARING RESTRICTIONS: No  PATIENT GOALS:  I just want the pain to be more bearable. Be able to do some of the things I used to do. Walking for fitness with husband.   OBJECTIVE   TREATMENT      03/23/24: THEREX   Education on use of TENS with box technique and setting to 6 using 2 leads and TENS 3000 unit    -Handout provided to patient for additional review about pad placement and general info about TENS Standing repeated lumbar extension 1 x 10  -No decrease in pain  Standing repeated lumbar flexion 1 x 10   -Pt reports increase in radiating pain down RLE    Seated Forward Ball Roll ([facility]) Center, Right, and Left  3 x 10   -Pt dow not experience an increase in her pain.  Modified Oswestry Disability Index (38/50,  56%) Patient Specific Functional Scale (PSFS) -Walking 3/10  -Shopping 4/10   -Sitting 6/10  Average: 4.33    Discussed imaging and how it could explain her symptoms:   CLINICAL DATA:  Lumbar radiculopathy with increased fracture risk.   EXAM: CT LUMBAR SPINE WITHOUT CONTRAST   TECHNIQUE: Multidetector CT imaging of the lumbar spine was performed without intravenous contrast administration. Multiplanar CT image reconstructions were also generated.  RADIATION DOSE REDUCTION: This exam was performed according to the departmental dose-optimization program which includes automated exposure control, adjustment of the mA and/or kV according to patient size and/or use of iterative reconstruction technique.   COMPARISON:  Lumbar radiography 02/04/2022   FINDINGS: Segmentation: 5 lumbar type vertebrae.   Alignment: Normal.   Vertebrae: No acute fracture or focal pathologic process. Limbus vertebra at the anterior superior corner of L4. Sclerosis at the anterior superior corner of T12.   Paraspinal and other soft tissues: No perispinal mass or inflammation.   Disc levels:   T12- L1: Unremarkable.   L1-L2: Disc narrowing with gas containing fissure and disc bulging. Small facet spurs.   L2-L3: Disc narrowing with annular calcification and minor bulge. No neural impingement   L3-L4: Disc narrowing with gas containing fissure also extending into a upward pointing central disc herniation that is small. No neural compression.   L4-L5: Mild annulus bulging.  Negative facets.   L5-S1:Mild disc bulging. Moderate degenerative facet spurring. Suspect a synovial cyst projecting anterior to the left facet which would impact the left L5 nerve root.   IMPRESSION: 1. No acute finding. 2. Lumbar spine degeneration as described. Possible anterior synovial cyst at the degenerated left L5-S1 facet, which would impinge on the left L5 foraminal nerve root.     Electronically Signed   By: Dorn Roulette M.D.   On: 08/21/2023 09:53                                                                                                                        PATIENT EDUCATION:  Education details: Exercise purpose/form. Self management techniques. Education on diagnosis, prognosis, POC, anatomy and physiology of current condition. Education on HEP including handout. Person educated: Patient Education method: Explanation, Demonstration, and Verbal  cues, handouts Education comprehension: verbalized understanding, returned demonstration, and needs further education  HOME EXERCISE PROGRAM: Access Code: [email or web address] URL: [email or web address] Date: 03/23/2024 Prepared by: Toribio Servant  Exercises - Sitting to Supine Roll  - Standing Towel Slide Forward, Right, and Left     - 1 x daily - 7 x weekly - 3 sets - 10 reps - Half Kneeling Hip Flexor Stretch  - 1 x daily - 1 sets - 3 reps - 45 seconds hold - Supine Single Leg Hip Extension Isometric Into Table  - 1 x daily - 3 sets - 5 reps - 5 seconds hold - Lock Clams  - 1 x daily - 2 sets - 10 reps - 5 seconds hold - 90 seconds time practicing per set - Seated Flexion Stretch with Swiss Ball  - 1 x daily - 1 sets - 20 reps - 5 seconds hold - Standing Quadratus Lumborum Stretch with Doorway  - 1 x daily - 3 reps - 30 seconds hold  ASSESSMENT:  CLINICAL IMPRESSION: Patient making limited progress towards goals with ongoing RLE radiating nerve pain limiting her activity. She demonstrates some improvement with use of TENS, but only with decreasing  localized pain to low back and not to improved radicular pain. She does not have clear directional preference with pain that does not centralize. PT focused on updating home exercise plan to include towel slides in standing to improve tolerance to movement and improve lumbar mobility.  PT also recommends that pt continue to utilize TENS unit while at work and a handout was provided to pt with explanation about how to setup pads. She will continue to benefit from continued management of limiting condition by skilled physical therapist to address remaining impairments and functional limitations to work towards stated goals and return to PLOF or maximal functional independence. However, given limited progress, PT recommends only an additional five visits at most before needing referral back to medical team for further evaluation and treatment.     OBJECTIVE IMPAIRMENTS: Abnormal gait, decreased activity tolerance, decreased balance, decreased coordination, decreased endurance, decreased knowledge of condition, decreased mobility, difficulty walking, decreased ROM, decreased strength, increased muscle spasms, impaired flexibility, improper body mechanics, obesity, and pain.   ACTIVITY LIMITATIONS: carrying, lifting, bending, sitting, standing, squatting, sleeping, stairs, transfers, bed mobility, bathing, dressing, locomotion level, and caring for others  PARTICIPATION LIMITATIONS: meal prep, cleaning, laundry, interpersonal relationship, driving, shopping, community activity, occupation, yard work, and church  PERSONAL FACTORS: Fitness, Past/current experiences, Time since onset of injury/illness/exacerbation, and 3+ comorbidities:   OSA (obstructive sleep apnea); S/P hysterectomy; Controlled diabetes mellitus type 2 with complications (HCC); Morbid obesity (HCC); Hyperlipidemia associated with type 2 diabetes mellitus (HCC); Microscopic hematuria; Fatty liver; HTN (hypertension); Anxiety; Osteoarthritis of spine with radiculopathy, cervical region; Osteoarthritis of spine with radiculopathy, thoracic region; Cervical spondylosis with myelopathy and radiculopathy; Uveitis; Chronic low back pain; Disorder of right median nerve; History of carpal tunnel surgery of right wrist; Lumbar radiculopathy; Numbness of hand; Numbness of upper limb; Pain in right foot; and Chronic anterior uveitis of left eye on their problem list. she  has a past medical history of Allergy (2012), Anxiety, Arthritis, Asthma, Diabetes mellitus without complication (HCC) (2016), GERD (gastroesophageal reflux disease), Heart murmur, History of kidney stones, Hypertension, Neuromuscular disorder (HCC), PONV (postoperative nausea and vomiting), Shingles, and Sleep apnea. she  has a past surgical history that includes Cesarean section; Spinal fusion (2012); Total abdominal  hysterectomy; Diagnostic laparoscopy (2006); Cholecystectomy (N/A, 01/18/2017); Colonoscopy with propofol  (N/A, 10/13/2020); Anterior cervical decomp/discectomy fusion (N/A, 04/13/2022); Extracorporeal shock wave lithotripsy (Left, 07/12/2022); Cystoscopy/ureteroscopy/holmium laser/stent placement (Left, 08/21/2022); and Carpel Tunnel Surgery (Right, 10/01/2023). are also affecting patient's functional outcome.   REHAB POTENTIAL: Good  CLINICAL DECISION MAKING: Evolving/moderate complexity  EVALUATION COMPLEXITY: Moderate   GOALS: Goals reviewed with patient? Yes  SHORT TERM GOALS: Target date: 02/26/2024  Patient will be independent with initial home exercise program for self-management of symptoms. Baseline: Initial HEP provided at IE (02/12/24);    Goal status: ACHIEVED     LONG TERM GOALS: Target date: 05/06/2024  Patient will be independent with a long-term home exercise program for self-management of symptoms.  Baseline: Initial HEP provided at IE (02/12/24);   03/23/24 Performing exercises independently   Goal status: ACHIEVED    2.  Patient will demonstrate improved Modified Oswestry Disability Index (mODI) to equal or less than 10% to demonstrate improvement in overall condition and self-reported functional ability.  Baseline: 56% (02/12/24);  56% 03/23/24    Goal status: No Progress     3.  Patient will demonstrate the ability to perform sit <> stand and bed mobility without increased pain and without pain behaviars to improve her ability  to work and sleep.  Baseline: painful and altered movement pattern (02/12/24);  4/10 NRPS  03/23/24 Goal status: Limited Progress    4.  Patient will demonstrate 10 rep heel raise equal bilaterally to demonstrate improved LE function for working, ADLs, and IADLs.  Baseline: unable to do heel raises on R side but able on L (02/12/24);   Goal status: In progress  5.  Patient will demonstrate improvement in Patient Specific Functional  Scale (PSFS) of equal or greater than 8/10 points to reflect clinically significant improvement in patient's most valued functional activities. Baseline: to be measured at visit 2 as appropriate (02/12/24); 3.5/10 (02/17/2024); 4.3/10  03/23/24   Goal status: in progress  6.  Patient will report NPRS equal or less than 3/10 during functional activities during the last 2 weeks to improve their abilitly to complete community, work and/or recreational activities with less limitation. Baseline: up to 10/10 (02/12/24); 6-7/10 NRPS (03/23/24 ) Goal status: In Progress     PLAN:  PT FREQUENCY: 2x/week  PT DURATION: 8-12 weeks  PLANNED INTERVENTIONS: 97164- PT Re-evaluation, 97750- Physical Performance Testing, 97110-Therapeutic exercises, 97530- Therapeutic activity, 97112- Neuromuscular re-education, 97535- Self Care, 02859- Manual therapy, [id number]- Electrical stimulation (unattended), 20560 (1-2 muscles), 20561 (3+ muscles)- Dry Needling, Patient/Family education, Balance training, Joint mobilization, Spinal mobilization, Cryotherapy, and Moist heat.  PLAN FOR NEXT SESSION: TA activation in standing and nerve glides in prone.  update HEP as appropriate, education on mechanical stressors and modifications of activities to avoid them, recover motor control and awareness of modifiers to mechanical sensitivities, retrain motor patterns, regain ROM, improve strength and resilience needed for  performing desired functional performance with appropriate ROM, strength, power, and endurance. Manual therapy as needed.  Toribio Servant PT, DPT  [facility] [address] [place], [address] Phone: [phone]   Fax:  [phone]

## 2024-03-24 ENCOUNTER — Inpatient Hospital Stay: Admission: RE | Admit: 2024-03-24 | Discharge: 2024-03-24 | Attending: Neurosurgery | Admitting: Neurosurgery

## 2024-03-24 DIAGNOSIS — M4802 Spinal stenosis, cervical region: Secondary | ICD-10-CM

## 2024-03-24 DIAGNOSIS — M47812 Spondylosis without myelopathy or radiculopathy, cervical region: Secondary | ICD-10-CM | POA: Diagnosis not present

## 2024-03-25 ENCOUNTER — Other Ambulatory Visit: Payer: Self-pay

## 2024-03-25 ENCOUNTER — Other Ambulatory Visit: Payer: Self-pay | Admitting: Family Medicine

## 2024-03-25 MED FILL — Atorvastatin Calcium Tab 40 MG (Base Equivalent): ORAL | 84 days supply | Qty: 12 | Fill #0 | Status: AC

## 2024-03-26 ENCOUNTER — Other Ambulatory Visit: Payer: Self-pay

## 2024-03-26 ENCOUNTER — Other Ambulatory Visit: Payer: Self-pay | Admitting: Family Medicine

## 2024-03-26 ENCOUNTER — Ambulatory Visit: Admitting: Physical Therapy

## 2024-03-26 ENCOUNTER — Encounter: Payer: Self-pay | Admitting: Physical Therapy

## 2024-03-26 VITALS — BP 147/66 | HR 73

## 2024-03-26 DIAGNOSIS — M6281 Muscle weakness (generalized): Secondary | ICD-10-CM

## 2024-03-26 DIAGNOSIS — M5416 Radiculopathy, lumbar region: Secondary | ICD-10-CM | POA: Diagnosis not present

## 2024-03-26 DIAGNOSIS — R2689 Other abnormalities of gait and mobility: Secondary | ICD-10-CM

## 2024-03-26 NOTE — Therapy (Signed)
 OUTPATIENT PHYSICAL THERAPY TREATMENT    Dates of Reporting:  02/12/24-  03/23/24     Patient Name: Tricia Hill MRN: [id number] DOB:03-17-1971, 53 y.o., female Today's Date: 03/26/2024  END OF SESSION:  PT End of Session - 03/26/24 1116     Visit Number 11    Number of Visits 17    Date for Recertification  05/06/24    Authorization Type [place] [employer] PPO reporting from 02/12/2024    Authorization - Visit Number 11    Authorization - Number of Visits 17    Progress Note Due on Visit 10    PT Start Time 1116    PT Stop Time 1200    PT Time Calculation (min) 44 min    Activity Tolerance Patient limited by pain    Behavior During Therapy [facility] for tasks assessed/performed            Past Medical History:  Diagnosis Date   Allergy 2012   Anxiety    Arthritis    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   Asthma    Diabetes mellitus without complication (HCC) 2016   GERD (gastroesophageal reflux disease)    Heart murmur    child   History of kidney stones    Hypertension    Neuromuscular disorder (HCC)    right hands tingling and numbness   PONV (postoperative nausea and vomiting)    Shingles    Sleep apnea    USE CPAP   Past Surgical History:  Procedure Laterality Date   ANTERIOR CERVICAL DECOMP/DISCECTOMY FUSION N/A 04/13/2022   Procedure: Anterior Cervical Discectomy Fusion - Cervical seven-Thoracic one removal of plate;  Surgeon: Louis Shove, MD;  Location: [facility] OR;  Service: Neurosurgery;  Laterality: N/A;   Carpel Tunnel Surgery Right 10/01/2023   CESAREAN SECTION     X 2   CHOLECYSTECTOMY N/A 01/18/2017   Procedure: LAPAROSCOPIC CHOLECYSTECTOMY WITH INTRAOPERATIVE CHOLANGIOGRAM;  Surgeon: Dessa Reyes ORN, MD;  Location: [facility] ORS;  Service: General;  Laterality: N/A;   COLONOSCOPY WITH PROPOFOL  N/A 10/13/2020   Procedure: COLONOSCOPY WITH PROPOFOL ;  Surgeon: Unk Corinn Skiff, MD;  Location: [facility] ENDOSCOPY;  Service: Gastroenterology;  Laterality: N/A;   COVID POSITIVE 09/30/2020   CYSTOSCOPY/URETEROSCOPY/HOLMIUM LASER/STENT PLACEMENT Left 08/21/2022   Procedure: CYSTOSCOPY/URETEROSCOPY/HOLMIUM LASER/STENT PLACEMENT;  Surgeon: Twylla Glendia BROCKS, MD;  Location: [facility] ORS;  Service: Urology;  Laterality: Left;   DIAGNOSTIC LAPAROSCOPY  2006   EXCISION OF ABDOMINAL WALL ENDOMETRIOMA   EXTRACORPOREAL SHOCK WAVE LITHOTRIPSY Left 07/12/2022   Procedure: EXTRACORPOREAL SHOCK WAVE LITHOTRIPSY (ESWL);  Surgeon: Penne Knee, MD;  Location: [facility] ORS;  Service: Urology;  Laterality: Left;   SPINAL FUSION  2012   C3 and C4   TOTAL ABDOMINAL HYSTERECTOMY     Partial   Patient Active Problem List   Diagnosis Date Noted   Chronic anterior uveitis of left eye 02/05/2024   Chronic low back pain 12/03/2023   Disorder of right median nerve 12/03/2023   Lumbar radiculopathy 12/03/2023   Numbness of hand 12/03/2023   History of carpal tunnel surgery of right wrist 10/01/2023   Uveitis 07/11/2023   Numbness of upper limb 05/28/2023   Pain in right foot 05/28/2023   Cervical spondylosis with myelopathy and radiculopathy 04/13/2022   Osteoarthritis of spine with radiculopathy, cervical region 02/08/2022   Osteoarthritis of spine with radiculopathy, thoracic region 02/08/2022   Anxiety 09/05/2021   HTN (hypertension) 04/16/2018   Fatty liver 12/07/2016   Microscopic hematuria 05/26/2015   OSA (  obstructive sleep apnea) 01/31/2015   S/P hysterectomy 01/31/2015   Controlled diabetes mellitus type 2 with complications (HCC) 01/31/2015   Morbid obesity (HCC) 01/31/2015   Hyperlipidemia associated with type 2 diabetes mellitus (HCC) 01/31/2015    PCP: Vicci Duwaine SQUIBB, DO  REFERRING PROVIDER: Louis Shove, MD (neurosurgery)  REFERRING DIAG: lumbar radiculopathy  Rationale for Evaluation and Treatment: Rehabilitation  THERAPY DIAG:  Radiculopathy, lumbar region  Muscle weakness (generalized)  Other abnormalities of gait and mobility  ONSET DATE:  for > 5 years but legs worse over the last few months, especially since carpal tunnel surgery  SUBJECTIVE:                                                                                                                                                                                            PERTINENT HISTORY:  Patient is a 53 y.o. female who presents to outpatient physical therapy with a referral for medical diagnosis lumbar radiculopathy. This patient's chief complaints consist of low back and leg pain and leg weakness leading to the following functional deficits: difficulty with usual activities such as sitting, walking, bed mobility, transfers, bending, dressing, lifting, personal care, traveling, social life, working as a Engineer, civil (consulting), homemaking, sleeping, and leisure. Relevant past medical history and comorbidities include the following: she has OSA (obstructive sleep apnea); S/P hysterectomy; Controlled diabetes mellitus type 2 with complications (HCC); Morbid obesity (HCC); Hyperlipidemia associated with type 2 diabetes mellitus (HCC); Microscopic hematuria; Fatty liver; HTN (hypertension); Anxiety; Osteoarthritis of spine with radiculopathy, cervical region; Osteoarthritis of spine with radiculopathy, thoracic region; Cervical spondylosis with myelopathy and radiculopathy; Uveitis; Chronic low back pain; Disorder of right median nerve; History of carpal tunnel surgery of right wrist; Lumbar radiculopathy; Numbness of hand; Numbness of upper limb; Pain in right foot; and Chronic anterior uveitis of left eye on their problem list. she  has a past medical history of Allergy (2012), Anxiety, Arthritis, Asthma, Diabetes mellitus without complication (HCC) (2016), GERD (gastroesophageal reflux disease), Heart murmur, History of kidney stones, Hypertension, Neuromuscular disorder (HCC), PONV (postoperative nausea and vomiting), Shingles, and Sleep apnea. she  has a past surgical history that includes Cesarean  section; Spinal fusion (2012); Total abdominal hysterectomy; Diagnostic laparoscopy (2006); Cholecystectomy (N/A, 01/18/2017); Colonoscopy with propofol  (N/A, 10/13/2020); Anterior cervical decomp/discectomy fusion (N/A, 04/13/2022); Extracorporeal shock wave lithotripsy (Left, 07/12/2022); Cystoscopy/ureteroscopy/holmium laser/stent placement (Left, 08/21/2022); and Carpel Tunnel Surgery (Right, 10/01/2023). Patient denies hx of cancer, stroke, seizures, lung problems, and unexplained stumbling or dropping things. Drops things. Has had 2 neck surgeries but no low back surgeries.   She states she has urinary incontinence and this has worsened  over time. She is always changing her underwear or wearing a pad. Usually it is when coughing and sneezing. Also has urgency and it can start leaking before she gets to the bathroom. It is harder to hold the closer she gets to the toilet.   Exercise history: joined the gym in hopes to lose weight but cannot go because of low back pian. She wanted to do weight lifting.   She is terrified because her mom has similar back problems and cannot do anything now and has sort of given up. Her sister is also going through the same thing with her back. Patient's kids are getting married and she wants to be active and do things with her grandchildren.   SUBJECTIVE STATEMENT: Patient states her leg pain has been severe this morning. She feels like the pain down her legs is nerve pain. It has not been better since not working since Sunday night. Her left leg tried to buckle this morning when the pain shot through the calf region.     PAIN: NPRS: Current: 7/10 at central low back pressure across sacrum, and pain in bilateral legs over anterior, posterior, and lateral thighs and medial lower legs and over top of feet.    From initial PT evaluation on 02/12/24:  Best: 7/10 Right Side Low Back Pain  Pain location: Across top of pelvis (was more on R but now more L). This is the  starting area. It would ache and get better. Now it is constantly there. She now has intermittent pain down the back of both thighs to the tops of both feet which can happen together or be one side or the other.  Pain description: Dull, occasionally sharp like a zap behind left knee Aggravating factors: Getting up from sitting, prolonged standing ( ), prolonged walking (10 min), sitting (legs hurt), standing legs and back hurt, sitting with legs propped up in front of her makes her legs hurt more, bending.  Relieving factors: injection partially, sitting can help her back (but makes her legs hurt)   PRECAUTIONS: No lifting of 10# or more due to R hand (recommended by OT so it doesn't damage her neck and arm until she gets more Hill in her arm).   WEIGHT BEARING RESTRICTIONS: No  PATIENT GOALS:  I just want the pain to be more bearable. Be able to do some of the things I used to do. Walking for fitness with husband.   OBJECTIVE  Vitals:   03/26/24 1220  BP: (!) 147/66  Pulse: 73  SpO2: 97%     TREATMENT      Modality: for pain control and muscle relaxation applied throughout session.  TENS estim applied to low back with 2x4 inch oval pads while completing exercises as noted below. Two channels (intensity to level 7 and 7 on 2 channels). Mode: modulated, Pulse width: 120 nanoS, Pulse Rate: 110 hz, continuous. Applied throughout session. Pt response: decreased back pain during application of estim. skin within normal limits to visual inspection before and after.  Moist heat applied to low back for comfort during supine/hooklying positions.    Manual therapy: to reduce pain and tissue tension, improve range of motion, neuromodulation, in order to promote improved ability to complete functional activities. HOOKLYING Intermittent manual lumbar traction with belt around back of knees 10x10 seconds on/off  Therapeutic exercise: therapeutic exercises that incorporate ONE parameter  at one or more areas of the body to centralize symptoms, develop Hill and endurance, range of motion, and  flexibility required for successful completion of functional activities.  Seated lumbar flexion sliding towel on table While PT preparing TENS unit Education to perform this in sitting instead of standing to decrease neural tension from L-shaped position of standing while leaning forwards.   Hooklying PPT/TrA/pelvic floor contraction with isometric hip extension with knee flexed and resting on traction stool focusing on coordinated movement to activate the lumbar multifidi for improved segmental control 1x10 each side with 10 second holds 1x6 on R side with 10 second holds Discontinued due to pain worsening in R LE Cuing for keeping range and force within tolerated levels  Vitals measurement when pt expressed concern about blood pressure and continues to report head pain after getting up from lying position.   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   Pt required multimodal cuing for proper technique and to facilitate improved neuromuscular control, Hill, range of motion, and functional ability resulting in improved performance and form.                                                                                                  PATIENT EDUCATION:  Education details: Exercise purpose/form. Self management techniques. Education on diagnosis, prognosis, POC, anatomy and physiology of current condition. Education on HEP including handout. Person educated: Patient Education method: Explanation, Demonstration, and Verbal cues, handouts Education comprehension: verbalized understanding, returned demonstration, and needs further education  HOME EXERCISE PROGRAM: Access Code: [email or web address] URL: [email or web address] Date: 03/23/2024 Prepared by: Toribio Servant  Exercises - Sitting to Supine Roll  - Standing Towel Slide Forward, Right, and Left     - 1 x daily - 7 x weekly - 3 sets - 10 reps - Half Kneeling Hip Flexor Stretch  - 1 x daily - 1 sets - 3 reps - 45 seconds hold - Supine Single Leg Hip Extension Isometric Into Table  - 1 x daily - 3 sets - 5 reps - 5 seconds hold - Lock Clams  - 1 x daily - 2 sets - 10 reps - 5 seconds hold - 90 seconds time practicing per set - Seated Flexion Stretch with Swiss Ball  - 1 x daily - 1 sets - 20 reps - 5 seconds hold - Standing Quadratus Lumborum Stretch with Doorway  - 1 x daily - 3 reps - 30 seconds hold  ASSESSMENT:  CLINICAL IMPRESSION: Patient continues to have worsening of symptoms with R LE giving way this morning. Today's session focused on interventions for symptom/pain control using TENS, moist heat, and lumbar manual traction. Patient with improved L LE symptoms by end of session but continued to be limited by R LE symptoms that stopped her from doing gentle core activation and multifidi strengthening. Neck CT does not strongly suggest cervical spine myelopathy or cause for lower extremity symptoms. Patient to follow up with referring physician next week and PT in agreement that she needs further medical evaluation to explore options to improve progress in recovery. Continued with recommendations for modifying  activities for symptom alleviation and very gentle exercises focused on this as well. Patient would benefit from continued management of limiting condition by skilled physical therapist to address remaining impairments and functional limitations to work towards stated goals and return to PLOF or maximal functional independence.   OBJECTIVE IMPAIRMENTS: Abnormal gait, decreased activity tolerance, decreased balance, decreased coordination, decreased endurance, decreased knowledge of condition, decreased mobility, difficulty walking, decreased ROM, decreased Hill, increased muscle  spasms, impaired flexibility, improper body mechanics, obesity, and pain.   ACTIVITY LIMITATIONS: carrying, lifting, bending, sitting, standing, squatting, sleeping, stairs, transfers, bed mobility, bathing, dressing, locomotion level, and caring for others  PARTICIPATION LIMITATIONS: meal prep, cleaning, laundry, interpersonal relationship, driving, shopping, community activity, occupation, yard work, and church  PERSONAL FACTORS: Fitness, Past/current experiences, Time since onset of injury/illness/exacerbation, and 3+ comorbidities:   OSA (obstructive sleep apnea); S/P hysterectomy; Controlled diabetes mellitus type 2 with complications (HCC); Morbid obesity (HCC); Hyperlipidemia associated with type 2 diabetes mellitus (HCC); Microscopic hematuria; Fatty liver; HTN (hypertension); Anxiety; Osteoarthritis of spine with radiculopathy, cervical region; Osteoarthritis of spine with radiculopathy, thoracic region; Cervical spondylosis with myelopathy and radiculopathy; Uveitis; Chronic low back pain; Disorder of right median nerve; History of carpal tunnel surgery of right wrist; Lumbar radiculopathy; Numbness of hand; Numbness of upper limb; Pain in right foot; and Chronic anterior uveitis of left eye on their problem list. she  has a past medical history of Allergy (2012), Anxiety, Arthritis, Asthma, Diabetes mellitus without complication (HCC) (2016), GERD (gastroesophageal reflux disease), Heart murmur, History of kidney stones, Hypertension, Neuromuscular disorder (HCC), PONV (postoperative nausea and vomiting), Shingles, and Sleep apnea. she  has a past surgical history that includes Cesarean section; Spinal fusion (2012); Total abdominal hysterectomy; Diagnostic laparoscopy (2006); Cholecystectomy (N/A, 01/18/2017); Colonoscopy with propofol  (N/A, 10/13/2020); Anterior cervical decomp/discectomy fusion (N/A, 04/13/2022); Extracorporeal shock wave lithotripsy (Left, 07/12/2022);  Cystoscopy/ureteroscopy/holmium laser/stent placement (Left, 08/21/2022); and Carpel Tunnel Surgery (Right, 10/01/2023). are also affecting patient's functional outcome.   REHAB POTENTIAL: Good  CLINICAL DECISION MAKING: Evolving/moderate complexity  EVALUATION COMPLEXITY: Moderate   GOALS: Goals reviewed with patient? Yes  SHORT TERM GOALS: Target date: 02/26/2024  Patient will be independent with initial home exercise program for self-management of symptoms. Baseline: Initial HEP provided at IE (02/12/24);    Goal status: ACHIEVED     LONG TERM GOALS: Target date: 05/06/2024  Patient will be independent with a long-term home exercise program for self-management of symptoms.  Baseline: Initial HEP provided at IE (02/12/24);   03/23/24 Performing exercises independently   Goal status: ACHIEVED    2.  Patient will demonstrate improved Modified Oswestry Disability Index (mODI) to equal or less than 10% to demonstrate improvement in overall condition and self-reported functional ability.  Baseline: 56% (02/12/24);  56% 03/23/24    Goal status: No Progress     3.  Patient will demonstrate the ability to perform sit <> stand and bed mobility without increased pain and without pain behaviars to improve her ability to work and sleep.  Baseline: painful and altered movement pattern (02/12/24);  4/10 NRPS  03/23/24 Goal status: Limited Progress    4.  Patient will demonstrate 10 rep heel raise equal bilaterally to demonstrate improved LE function for working, ADLs, and IADLs.  Baseline: unable to do heel raises on R side but able on L (02/12/24);   Goal status: In progress  5.  Patient will demonstrate improvement in Patient Specific Functional Scale (PSFS) of equal or greater than 8/10 points to reflect clinically  significant improvement in patient's most valued functional activities. Baseline: to be measured at visit 2 as appropriate (02/12/24); 3.5/10 (02/17/2024); 4.3/10  03/23/24    Goal status: in progress  6.  Patient will report NPRS equal or less than 3/10 during functional activities during the last 2 weeks to improve their abilitly to complete community, work and/or recreational activities with less limitation. Baseline: up to 10/10 (02/12/24); 6-7/10 NRPS (03/23/24 ) Goal status: In Progress     PLAN:  PT FREQUENCY: 2x/week  PT DURATION: 8-12 weeks  PLANNED INTERVENTIONS: 97164- PT Re-evaluation, 97750- Physical Performance Testing, 97110-Therapeutic exercises, 97530- Therapeutic activity, 97112- Neuromuscular re-education, 97535- Self Care, 02859- Manual therapy, G0283- Electrical stimulation (unattended), 20560 (1-2 muscles), 20561 (3+ muscles)- Dry Needling, Patient/Family education, Balance training, Joint mobilization, Spinal mobilization, Cryotherapy, and Moist heat.  PLAN FOR NEXT SESSION: Pain control. TA activation in standing? update HEP as appropriate, education on mechanical stressors and modifications of activities to avoid them, recover motor control and awareness of modifiers to mechanical sensitivities, retrain motor patterns, regain ROM, improve Hill and resilience needed for  performing desired functional performance with appropriate ROM, Hill, power, and endurance. Manual therapy as needed.  Camie SAUNDERS. Juli, PT, DPT, Cert. MDT 03/26/24, 12:27 PM  [facility] [address] [place], [place] [address] P: [phone] I F: [phone]

## 2024-03-27 ENCOUNTER — Other Ambulatory Visit: Payer: Self-pay

## 2024-03-27 MED FILL — Glucose Blood Test Strip: 90 days supply | Qty: 100 | Fill #0 | Status: CN

## 2024-03-27 MED FILL — Lancets: 90 days supply | Qty: 100 | Fill #0 | Status: CN

## 2024-03-27 NOTE — Telephone Encounter (Signed)
 Requested Prescriptions  Pending Prescriptions Disp Refills   FREESTYLE LITE test strip [Pharmacy Med Name: glucose blood (FREESTYLE LITE) test strip] 100 strip 0    Sig: Use as directed     Endocrinology: Diabetes - Testing Supplies Passed - 03/27/2024  3:04 PM      Passed - Valid encounter within last 12 months    Recent Outpatient Visits           1 month ago Routine general medical examination at a health care facility   [facility], Megan P, DO   3 months ago Anxiety   [place] [facility], [place] P, DO   5 months ago Controlled type 2 diabetes mellitus with complication, without long-term current use of insulin  [facility])   [place] [facility], Megan P, DO   7 months ago OSA (obstructive sleep apnea)   [place] [facility], Megan P, DO               Lancets (FREESTYLE) lancets 100 each 0    Sig: Use as directed.     Endocrinology: Diabetes - Testing Supplies Passed - 03/27/2024  3:04 PM      Passed - Valid encounter within last 12 months    Recent Outpatient Visits           1 month ago Routine general medical examination at a health care facility   [facility], Megan P, DO   3 months ago Anxiety   [place] [facility] [place], [place] P, DO   5 months ago Controlled type 2 diabetes mellitus with complication, without long-term current use of insulin  [facility])   [place] [facility], Megan P, DO   7 months ago OSA (obstructive sleep apnea)   [place] [facility], Megan P, DO

## 2024-03-31 ENCOUNTER — Ambulatory Visit: Admitting: Physical Therapy

## 2024-03-31 ENCOUNTER — Ambulatory Visit: Admitting: Occupational Therapy

## 2024-03-31 ENCOUNTER — Encounter: Payer: Self-pay | Admitting: Physical Therapy

## 2024-03-31 DIAGNOSIS — M5416 Radiculopathy, lumbar region: Secondary | ICD-10-CM

## 2024-03-31 DIAGNOSIS — M4802 Spinal stenosis, cervical region: Secondary | ICD-10-CM | POA: Diagnosis not present

## 2024-03-31 DIAGNOSIS — R208 Other disturbances of skin sensation: Secondary | ICD-10-CM

## 2024-03-31 DIAGNOSIS — R2689 Other abnormalities of gait and mobility: Secondary | ICD-10-CM

## 2024-03-31 DIAGNOSIS — L905 Scar conditions and fibrosis of skin: Secondary | ICD-10-CM

## 2024-03-31 DIAGNOSIS — M25631 Stiffness of right wrist, not elsewhere classified: Secondary | ICD-10-CM

## 2024-03-31 DIAGNOSIS — M47816 Spondylosis without myelopathy or radiculopathy, lumbar region: Secondary | ICD-10-CM | POA: Diagnosis not present

## 2024-03-31 DIAGNOSIS — Z6841 Body Mass Index (BMI) 40.0 and over, adult: Secondary | ICD-10-CM | POA: Diagnosis not present

## 2024-03-31 DIAGNOSIS — M6281 Muscle weakness (generalized): Secondary | ICD-10-CM

## 2024-03-31 NOTE — Therapy (Signed)
 OUTPATIENT PHYSICAL THERAPY TREATMENT    Dates of Reporting:  02/12/24-  03/23/24     Patient Name: Tricia Hill MRN: [id number] DOB:Apr 12, 1971, 53 y.o., female Today's Date: 03/31/2024  END OF SESSION:  PT End of Session - 03/31/24 1120     Visit Number 12    Number of Visits 17    Date for Recertification  05/06/24    Authorization Type [place] [employer] P[employer] reporting from 02/12/2024    Authorization - Visit Number 12    Authorization - Number of Visits 17    Progress Note Due on Visit 10    PT Start Time 1120    PT Stop Time 1200    PT Time Calculation (min) 40 min    Activity Tolerance Patient limited by pain    Behavior During Therapy [facility] for tasks assessed/performed          Past Medical History:  Diagnosis Date   Allergy 2012   Anxiety    Arthritis    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   Asthma    Diabetes mellitus without complication (HCC) 2016   GERD (gastroesophageal reflux disease)    Heart murmur    child   History of kidney stones    Hypertension    Neuromuscular disorder (HCC)    right hands tingling and numbness   PONV (postoperative nausea and vomiting)    Shingles    Sleep apnea    USE CPAP   Past Surgical History:  Procedure Laterality Date   ANTERIOR CERVICAL DECOMP/DISCECTOMY FUSION N/A 04/13/2022   Procedure: Anterior Cervical Discectomy Fusion - Cervical seven-Thoracic one removal of plate;  Surgeon: Louis Shove, MD;  Location: [facility] OR;  Service: Neurosurgery;  Laterality: N/A;   Carpel Tunnel Surgery Right 10/01/2023   CESAREAN SECTION     X 2   CHOLECYSTECTOMY N/A 01/18/2017   Procedure: LAPAROSCOPIC CHOLECYSTECTOMY WITH INTRAOPERATIVE CHOLANGIOGRAM;  Surgeon: Dessa Reyes ORN, MD;  Location: [facility] ORS;  Service: General;  Laterality: N/A;   COLONOSCOPY WITH PROPOFOL  N/A 10/13/2020   Procedure: COLONOSCOPY WITH PROPOFOL ;  Surgeon: Unk Corinn Skiff, MD;  Location: [facility] ENDOSCOPY;  Service: Gastroenterology;  Laterality: N/A;  COVID  POSITIVE 09/30/2020   CYSTOSCOPY/URETEROSCOPY/HOLMIUM LASER/STENT PLACEMENT Left 08/21/2022   Procedure: CYSTOSCOPY/URETEROSCOPY/HOLMIUM LASER/STENT PLACEMENT;  Surgeon: Twylla Glendia BROCKS, MD;  Location: [facility] ORS;  Service: Urology;  Laterality: Left;   DIAGNOSTIC LAPAROSCOPY  2006   EXCISION OF ABDOMINAL WALL ENDOMETRIOMA   EXTRACORPOREAL SHOCK WAVE LITHOTRIPSY Left 07/12/2022   Procedure: EXTRACORPOREAL SHOCK WAVE LITHOTRIPSY (ESWL);  Surgeon: Penne Knee, MD;  Location: [facility] ORS;  Service: Urology;  Laterality: Left;   SPINAL FUSION  2012   C3 and C4   TOTAL ABDOMINAL HYSTERECTOMY     Partial   Patient Active Problem List   Diagnosis Date Noted   Chronic anterior uveitis of left eye 02/05/2024   Chronic low back pain 12/03/2023   Disorder of right median nerve 12/03/2023   Lumbar radiculopathy 12/03/2023   Numbness of hand 12/03/2023   History of carpal tunnel surgery of right wrist 10/01/2023   Uveitis 07/11/2023   Numbness of upper limb 05/28/2023   Pain in right foot 05/28/2023   Cervical spondylosis with myelopathy and radiculopathy 04/13/2022   Osteoarthritis of spine with radiculopathy, cervical region 02/08/2022   Osteoarthritis of spine with radiculopathy, thoracic region 02/08/2022   Anxiety 09/05/2021   HTN (hypertension) 04/16/2018   Fatty liver 12/07/2016   Microscopic hematuria 05/26/2015   OSA (obstructive sleep  apnea) 01/31/2015   S/P hysterectomy 01/31/2015   Controlled diabetes mellitus type 2 with complications (HCC) 01/31/2015   Morbid obesity (HCC) 01/31/2015   Hyperlipidemia associated with type 2 diabetes mellitus (HCC) 01/31/2015    PCP: Vicci Duwaine SQUIBB, DO  REFERRING PROVIDER: Louis Shove, MD (neurosurgery)  REFERRING DIAG: lumbar radiculopathy  Rationale for Evaluation and Treatment: Rehabilitation  THERAPY DIAG:  Radiculopathy, lumbar region  Muscle weakness (generalized)  Other abnormalities of gait and mobility  ONSET DATE: for > 5  years but legs worse over the last few months, especially since carpal tunnel surgery  SUBJECTIVE:                                                                                                                                                                                            PERTINENT HISTORY:  Patient is a 53 y.o. female who presents to outpatient physical therapy with a referral for medical diagnosis lumbar radiculopathy. This patient's chief complaints consist of low back and leg pain and leg weakness leading to the following functional deficits: difficulty with usual activities such as sitting, walking, bed mobility, transfers, bending, dressing, lifting, personal care, traveling, social life, working as a Engineer, civil (consulting), homemaking, sleeping, and leisure. Relevant past medical history and comorbidities include the following: she has OSA (obstructive sleep apnea); S/P hysterectomy; Controlled diabetes mellitus type 2 with complications (HCC); Morbid obesity (HCC); Hyperlipidemia associated with type 2 diabetes mellitus (HCC); Microscopic hematuria; Fatty liver; HTN (hypertension); Anxiety; Osteoarthritis of spine with radiculopathy, cervical region; Osteoarthritis of spine with radiculopathy, thoracic region; Cervical spondylosis with myelopathy and radiculopathy; Uveitis; Chronic low back pain; Disorder of right median nerve; History of carpal tunnel surgery of right wrist; Lumbar radiculopathy; Numbness of hand; Numbness of upper limb; Pain in right foot; and Chronic anterior uveitis of left eye on their problem list. she  has a past medical history of Allergy (2012), Anxiety, Arthritis, Asthma, Diabetes mellitus without complication (HCC) (2016), GERD (gastroesophageal reflux disease), Heart murmur, History of kidney stones, Hypertension, Neuromuscular disorder (HCC), PONV (postoperative nausea and vomiting), Shingles, and Sleep apnea. she  has a past surgical history that includes Cesarean section;  Spinal fusion (2012); Total abdominal hysterectomy; Diagnostic laparoscopy (2006); Cholecystectomy (N/A, 01/18/2017); Colonoscopy with propofol  (N/A, 10/13/2020); Anterior cervical decomp/discectomy fusion (N/A, 04/13/2022); Extracorporeal shock wave lithotripsy (Left, 07/12/2022); Cystoscopy/ureteroscopy/holmium laser/stent placement (Left, 08/21/2022); and Carpel Tunnel Surgery (Right, 10/01/2023). Patient denies hx of cancer, stroke, seizures, lung problems, and unexplained stumbling or dropping things. Drops things. Has had 2 neck surgeries but no low back surgeries.   She states she has urinary incontinence and this has worsened over time.  She is always changing her underwear or wearing a pad. Usually it is when coughing and sneezing. Also has urgency and it can start leaking before she gets to the bathroom. It is harder to hold the closer she gets to the toilet.   Exercise history: joined the gym in hopes to lose weight but cannot go because of low back pian. She wanted to do weight lifting.   She is terrified because her mom has similar back problems and cannot do anything now and has sort of given up. Her sister is also going through the same thing with her back. Patient's kids are getting married and she wants to be active and do things with her grandchildren.   SUBJECTIVE STATEMENT: Patient states her legs are not bothering her as much this morning. It still comes and goes. She still has pain in the left posterior thigh. Work was not different this week. She is not know when her pain gets better. She states she felt okay after she left PT, but an hour or later it was back to baseline. She states she rested more yesterday following work than she is able to do regularly.    PAIN: NPRS: Current: 3/10 at central low back pressure across sacrum, 6/10 posterior left thigh   From initial PT evaluation on 02/12/24:  Best: 7/10 Right Side Low Back Pain  Pain location: Across top of pelvis (was more  on R but now more L). This is the starting area. It would ache and get better. Now it is constantly there. She now has intermittent pain down the back of both thighs to the tops of both feet which can happen together or be one side or the other.  Pain description: Dull, occasionally sharp like a zap behind left knee Aggravating factors: Getting up from sitting, prolonged standing ( ), prolonged walking (10 min), sitting (legs hurt), standing legs and back hurt, sitting with legs propped up in front of her makes her legs hurt more, bending.  Relieving factors: injection partially, sitting can help her back (but makes her legs hurt)   PRECAUTIONS: No lifting of 10# or more due to R hand (recommended by OT so it doesn't damage her neck and arm until she gets more strength in her arm).   WEIGHT BEARING RESTRICTIONS: No  PATIENT GOALS:  I just want the pain to be more bearable. Be able to do some of the things I used to do. Walking for fitness with husband.   OBJECTIVE   TREATMENT      Modality: for pain control and muscle relaxation applied throughout session.  TENS estim applied to low back with 2x4 inch oval pads while completing exercises as noted below. Two channels (intensity to level 7 and 7 on 2 channels). Mode: modulated, Pulse width: 120 nanoS, Pulse Rate: 110 hz, continuous. Applied throughout session. Pt response: decreased back pain during application of estim. skin within normal limits to visual inspection before and after.  Moist heat applied to low back for comfort during supine/hooklying positions, and was on dependent side during sidelying.    Manual therapy: to reduce pain and tissue tension, improve range of motion, neuromodulation, in order to promote improved ability to complete functional activities. HOOKLYING Intermittent manual lumbar traction with belt around back of knees 10x10 seconds on/off  L sidelying STM to L > R glute med, max, posterior hip and  QL  Therapeutic exercise: therapeutic exercises that incorporate ONE parameter at one or more areas of the body  to centralize symptoms, develop strength and endurance, range of motion, and flexibility required for successful completion of functional activities.  Sidelying clam shell with pillow between legs and gentle TrA contraction during motion 1x10 with 5 second holds  Ipsilateral glute irritation both sides  Neuromuscular Re-education: a technique or exercise performed with the goal of improving the level of communication between the body and the brain, such as for balance, motor control, muscle activation patterns, coordination, desensitization, quality of muscle contraction, proprioception, and/or kinesthetic sense needed for successful and safe completion of functional activities.   Standing PPT against wall with B shoulder flexion 1x10 learning how to perform and hold Added to HEP with video recorded on pt's phone  Standing leaning on elbows on plinth Pelvic tilt AROM, focusing on PPT 1x10 Alternating hip extension -> knee flexion while holding PPT  1x10 each side with 5 second holds Painful at left posterior thigh with L weight bearing when moving into R hip extension, so modified on that side to only flex R knee.  Knees flexed slightly to prevent sciatic nerve tension Added to HEP   Pt required multimodal cuing for proper technique and to facilitate improved neuromuscular control, strength, range of motion, and functional ability resulting in improved performance and form.                                                                                                  PATIENT EDUCATION:  Education details: Exercise purpose/form. Self management techniques. Education on diagnosis, prognosis, POC, anatomy and physiology of current condition. Education on HEP including handout. Person educated: Patient Education method: Explanation, Demonstration, and Verbal cues,  handouts Education comprehension: verbalized understanding, returned demonstration, and needs further education  HOME EXERCISE PROGRAM: Access Code: [email or web address] URL: [email or web address] Date: 03/31/2024 Prepared by: Camie Cleverly  Exercises - Sitting to Supine Roll  - Standing Towel Slide Forward, Right, and Left     - 1 x daily - 7 x weekly - 3 sets - 10 reps - Seated Flexion Stretch with Swiss Ball  - 1 x daily - 1 sets - 20 reps - 5 seconds hold - Posterior Pelvic Tilt at Wall Arms Overhead  - 1-2 x daily - 2 sets - 10 reps - 90 seconds time practicing per set - Standing Supported Hip Extension at Counter  - 1-2 x daily - 1-2 sets - 10 reps - 5 seconds hold - 90 seconds time practicing per set  ASSESSMENT:  CLINICAL IMPRESSION: Patient with better pain today overall but still no better by end of session. Continued with manual interventions for pain control followed by very gentle motor control exercises to improve anti-extension and improve lumbopelvic coordination and awareness. She does appear to have improved pain following better sleep/rest. Patient continues to have worst pain in left posterior thigh. Patient to see neurosurgeon for follow up today. Recommend pt discuss lack of meaningful progress with neurosurgeon and further medical assessment due to lack of positive response to 12 visits of PT.  OBJECTIVE IMPAIRMENTS: Abnormal gait, decreased activity tolerance, decreased balance, decreased coordination, decreased endurance, decreased knowledge of condition,  decreased mobility, difficulty walking, decreased ROM, decreased strength, increased muscle spasms, impaired flexibility, improper body mechanics, obesity, and pain.   ACTIVITY LIMITATIONS: carrying, lifting, bending, sitting, standing, squatting, sleeping, stairs, transfers, bed mobility, bathing, dressing, locomotion level, and caring for others  PARTICIPATION LIMITATIONS: meal prep, cleaning, laundry,  interpersonal relationship, driving, shopping, community activity, occupation, yard work, and church  PERSONAL FACTORS: Fitness, Past/current experiences, Time since onset of injury/illness/exacerbation, and 3+ comorbidities:   OSA (obstructive sleep apnea); S/P hysterectomy; Controlled diabetes mellitus type 2 with complications (HCC); Morbid obesity (HCC); Hyperlipidemia associated with type 2 diabetes mellitus (HCC); Microscopic hematuria; Fatty liver; HTN (hypertension); Anxiety; Osteoarthritis of spine with radiculopathy, cervical region; Osteoarthritis of spine with radiculopathy, thoracic region; Cervical spondylosis with myelopathy and radiculopathy; Uveitis; Chronic low back pain; Disorder of right median nerve; History of carpal tunnel surgery of right wrist; Lumbar radiculopathy; Numbness of hand; Numbness of upper limb; Pain in right foot; and Chronic anterior uveitis of left eye on their problem list. she  has a past medical history of Allergy (2012), Anxiety, Arthritis, Asthma, Diabetes mellitus without complication (HCC) (2016), GERD (gastroesophageal reflux disease), Heart murmur, History of kidney stones, Hypertension, Neuromuscular disorder (HCC), PONV (postoperative nausea and vomiting), Shingles, and Sleep apnea. she  has a past surgical history that includes Cesarean section; Spinal fusion (2012); Total abdominal hysterectomy; Diagnostic laparoscopy (2006); Cholecystectomy (N/A, 01/18/2017); Colonoscopy with propofol  (N/A, 10/13/2020); Anterior cervical decomp/discectomy fusion (N/A, 04/13/2022); Extracorporeal shock wave lithotripsy (Left, 07/12/2022); Cystoscopy/ureteroscopy/holmium laser/stent placement (Left, 08/21/2022); and Carpel Tunnel Surgery (Right, 10/01/2023). are also affecting patient's functional outcome.   REHAB POTENTIAL: Good  CLINICAL DECISION MAKING: Evolving/moderate complexity  EVALUATION COMPLEXITY: Moderate   GOALS: Goals reviewed with patient? Yes  SHORT  TERM GOALS: Target date: 02/26/2024  Patient will be independent with initial home exercise program for self-management of symptoms. Baseline: Initial HEP provided at IE (02/12/24);    Goal status: ACHIEVED     LONG TERM GOALS: Target date: 05/06/2024  Patient will be independent with a long-term home exercise program for self-management of symptoms.  Baseline: Initial HEP provided at IE (02/12/24);   03/23/24 Performing exercises independently   Goal status: ACHIEVED    2.  Patient will demonstrate improved Modified Oswestry Disability Index (mODI) to equal or less than 10% to demonstrate improvement in overall condition and self-reported functional ability.  Baseline: 56% (02/12/24);  56% 03/23/24    Goal status: No Progress     3.  Patient will demonstrate the ability to perform sit <> stand and bed mobility without increased pain and without pain behaviars to improve her ability to work and sleep.  Baseline: painful and altered movement pattern (02/12/24);  4/10 NRPS  03/23/24 Goal status: Limited Progress    4.  Patient will demonstrate 10 rep heel raise equal bilaterally to demonstrate improved LE function for working, ADLs, and IADLs.  Baseline: unable to do heel raises on R side but able on L (02/12/24);   Goal status: In progress  5.  Patient will demonstrate improvement in Patient Specific Functional Scale (PSFS) of equal or greater than 8/10 points to reflect clinically significant improvement in patient's most valued functional activities. Baseline: to be measured at visit 2 as appropriate (02/12/24); 3.5/10 (02/17/2024); 4.3/10  03/23/24   Goal status: in progress  6.  Patient will report NPRS equal or less than 3/10 during functional activities during the last 2 weeks to improve their abilitly to complete community, work and/or recreational activities with less limitation. Baseline: up  to 10/10 (02/12/24); 6-7/10 NRPS (03/23/24 ) Goal status: In Progress     PLAN:  PT  FREQUENCY: 2x/week  PT DURATION: 8-12 weeks  PLANNED INTERVENTIONS: 97164- PT Re-evaluation, 97750- Physical Performance Testing, 97110-Therapeutic exercises, 97530- Therapeutic activity, 97112- Neuromuscular re-education, 97535- Self Care, 02859- Manual therapy, [id number]- Electrical stimulation (unattended), 20560 (1-2 muscles), 20561 (3+ muscles)- Dry Needling, Patient/Family education, Balance training, Joint mobilization, Spinal mobilization, Cryotherapy, and Moist heat.  PLAN FOR NEXT SESSION: Consider results of neurosurgeon follow up.   Camie SAUNDERS. Juli, PT, DPT, Cert. MDT 03/31/24, 12:25 PM  [facility] [address] [place], [place] [address] P: [phone] I F: [phone]

## 2024-03-31 NOTE — Therapy (Signed)
 OUTPATIENT OCCUPATIONAL THERAPY ORTHO TREATMENT/RECERT  Patient Name: Tricia Hill MRN: [id number] DOB:1971/03/24, 53 y.o., female Today's Date: 04/02/2024  PCP: Dr Vicci MART PROVIDER: Dr Louis  END OF SESSION:  8 sessions out of 16 05/19/24 In time 10;35 Out time 11;16 40 min   Past Medical History:  Diagnosis Date   Allergy 2012   Anxiety    Arthritis    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   Asthma    Diabetes mellitus without complication (HCC) 2016   GERD (gastroesophageal reflux disease)    Heart murmur    child   History of kidney stones    Hypertension    Neuromuscular disorder (HCC)    right hands tingling and numbness   PONV (postoperative nausea and vomiting)    Shingles    Sleep apnea    USE CPAP   Past Surgical History:  Procedure Laterality Date   ANTERIOR CERVICAL DECOMP/DISCECTOMY FUSION N/A 04/13/2022   Procedure: Anterior Cervical Discectomy Fusion - Cervical seven-Thoracic one removal of plate;  Surgeon: Louis Shove, MD;  Location: [facility] OR;  Service: Neurosurgery;  Laterality: N/A;   Carpel Tunnel Surgery Right 10/01/2023   CESAREAN SECTION     X 2   CHOLECYSTECTOMY N/A 01/18/2017   Procedure: LAPAROSCOPIC CHOLECYSTECTOMY WITH INTRAOPERATIVE CHOLANGIOGRAM;  Surgeon: Dessa Reyes ORN, MD;  Location: [facility] ORS;  Service: General;  Laterality: N/A;   COLONOSCOPY WITH PROPOFOL  N/A 10/13/2020   Procedure: COLONOSCOPY WITH PROPOFOL ;  Surgeon: Unk Corinn Skiff, MD;  Location: [facility];  Service: Gastroenterology;  Laterality: N/A;  COVID POSITIVE 09/30/2020   CYSTOSCOPY/URETEROSCOPY/HOLMIUM LASER/STENT PLACEMENT Left 08/21/2022   Procedure: CYSTOSCOPY/URETEROSCOPY/HOLMIUM LASER/STENT PLACEMENT;  Surgeon: Twylla Glendia BROCKS, MD;  Location: [facility] OR[facility];  Service: Urology;  Laterality: Left;   DIAGNOSTIC LAPAROSCOPY  2006   EXCISION OF ABDOMINAL WALL ENDOMETRIOMA   EXTRACORPOREAL SHOCK WAVE LITHOTRIPSY Left 07/12/2022   Procedure: EXTRACORPOREAL  SHOCK WAVE LITHOTRIPSY (ESWL);  Surgeon: Penne Knee, MD;  Location: [facility];  Service: Urology;  Laterality: Left;   SPINAL FUSION  2012   C3 and C4   TOTAL ABDOMINAL HYSTERECTOMY     Partial   Patient Active Problem List   Diagnosis Date Noted   Chronic anterior uveitis of left eye 02/05/2024   Chronic low back pain 12/03/2023   Disorder of right median nerve 12/03/2023   Lumbar radiculopathy 12/03/2023   Numbness of hand 12/03/2023   History of carpal tunnel surgery of right wrist 10/01/2023   Uveitis 07/11/2023   Numbness of upper limb 05/28/2023   Pain in right foot 05/28/2023   Cervical spondylosis with myelopathy and radiculopathy 04/13/2022   Osteoarthritis of spine with radiculopathy, cervical region 02/08/2022   Osteoarthritis of spine with radiculopathy, thoracic region 02/08/2022   Anxiety 09/05/2021   HTN (hypertension) 04/16/2018   Fatty liver 12/07/2016   Microscopic hematuria 05/26/2015   OSA (obstructive sleep apnea) 01/31/2015   S/P hysterectomy 01/31/2015   Controlled diabetes mellitus type 2 with complications (HCC) 01/31/2015   Morbid obesity (HCC) 01/31/2015   Hyperlipidemia associated with type 2 diabetes mellitus (HCC) 01/31/2015    ONSET DATE: 10/01/23  REFERRING DIAG: R CTR  THERAPY DIAG:  Radiculopathy, lumbar region  Muscle weakness (generalized)  Scar tissue  Other abnormalities of gait and mobility  Other disturbances of skin sensation  Stiffness of right wrist, not elsewhere classified  Rationale for Evaluation and Treatment: Rehabilitation  SUBJECTIVE:   SUBJECTIVE STATEMENT: Both my hands will just go numb even if I sit down  or lay on the couch.  And then I feel is burning pain in the back of my neck at times.  My right hand so was to cramp up and spasm.  Like if I try and pull to just like a pillow underneath me.  Is just tender over my carpal tunnel and my forearm.  PERTINENT HISTORY: DR Louis note: The patient returns today  in follow-up. She continues to have some right him discomfort as she recover some carpal tunnel surgery. She has not started her physical therapy. She also notes persistent posterior cervical pain with some radiation. She is having no definite weakness. Her lumbar pain is stable. She did not get much improvement following recent epidural steroid injection   PRECAUTIONS: None    WEIGHT BEARING RESTRICTIONS: No  PAIN:  Are you having pain?  Increased pain and tenderness over the carpal tunnel and volar forearm/5/10  FALLS: Has patient fallen in last 6 months? 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  But not at the moment on medical leave.  Does house stuff around with cooking and cleaning and laundry.  On her phone.  Reads some.  Has 2 grownup kids and a 46 -year-old-does a little flowers  PATIENT GOALS: I just want the numbness better and the pain but then also more strength so I can do my job as a Engineer, civil (consulting)  NEXT MD VISIT: 21 August  OBJECTIVE:  Note: Objective measures were completed at Evaluation unless otherwise noted.  HAND DOMINANCE: Right  ADLs: Difficulty with cutting food, cooking doing laundry.  Putting on jewelry, do buttons, opening jars or packages or Ziploc bags: Cannot grip or hold things I drop things  FUNCTIONAL OUTCOME MEASURES: To be done excision  UPPER EXTREMITY ROM:     Active ROM Right eval Left eval  Shoulder flexion    Shoulder abduction    Shoulder adduction    Shoulder extension    Shoulder internal rotation    Shoulder external rotation    Elbow flexion    Elbow extension    Wrist flexion 70   Wrist extension 64   Wrist ulnar deviation    Wrist radial deviation    Wrist pronation    Wrist supination    (Blank rows = not tested)  Active ROM Right eval Left eval  Thumb MCP (0-60)    Thumb IP (0-80)    Thumb Radial abd/add (0-55)     Thumb Palmar abd/add (0-45)      Thumb Opposition to Small Finger     Index MCP (0-90)     Index PIP (0-100)     Index DIP (0-70)      Long MCP (0-90)      Long PIP (0-100)      Long DIP (0-70)      Ring MCP (0-90)      Ring PIP (0-100)      Ring DIP (0-70)      Little MCP (0-90)      Little PIP (0-100)      Little DIP (0-70)      Digit flexion within normal limits but tight when attempting to make a fist. Opposition within normal limits. Thumb palmar radial abduction within normal limits  HAND FUNCTION: Grip strength: Right: 27 lbs; Left: 50 lbs, Lateral pinch: Right: 4 lbs, Left: 12 lbs, and 3 point pinch: Right: 6 lbs, Left: 14 lbs 02/06/24: Grip  strength: Right: 29 lbs; Left: 50 lbs, Lateral pinch: Right: 4 lbs, Left: 12 lbs, and 3 point pinch: Right: 6 lbs, Left: 14 lbs  02/11/24: Grip strength: Right: 42 lbs; Left: 50 lbs, Lateral pinch: Right: 5 lbs, Left: 12 lbs, and 3 point pinch: Right: 7 lbs, Left: 14 lbs 03/03/24: Grip strength: Right: 44 lbs; Left: 50 lbs, Lateral pinch: Right: 6 lbs, Left: 12 lbs, and 3 point pinch: Right: 7 lbs, Left: 14 lbs COORDINATION: Decrease in limited because of numbness in digits at times  SENSATION: Patient report increased numbness from thumb through fourth digit on and off during the day and night  EDEMA: Not noticeable.  COGNITION: Overall cognitive status: Within functional limits for tasks assessed      TREATMENT DATE: 03/31/24                                                                                                                             Patient arrived with continues increased tightness in the forearm flexors, tenderness over the carpal tunnel and decreased wrist and digit extension.   Patient continues to have decreased strength in ECR as well as supination pronation Upon assessment patient's grip  same and prehension decreased.  With increased pain over the thumb thenar eminence at times Patient reports less pain and tenderness over thumb CMC  fitted patient this date on a prefab thumb CMC neoprene wrap to use during the day with activities that causes pain at the thumb.   Patient continues to show when she does a grip or lateral pinch patient ulnar deviate forearm unable to maintain midline.   Patient continues to do scar massage and daughter assist with soft tissue mobilization Paraffin bath done 8 minutes to decrease pain in right hand and wrist prior to soft tissue mobilization Focus this date on metacarpal spreads-webspace as well as volar forearm Done dry static light pressure cupping over volar forearm and hand with 10 repetitions of digit and wrist extension repeated 3 times With great progress in digit and wrist extension with less pain. Recommend for patient to focus on the forearm.  As well as stay away from a sustained tight grip when on the phone or holding objects. Pick up with her palmar forearm.   Continue with: tendon glides 12 reps Patient to continue with rubber band for resistance for palmar radial abduction 3 sets of 12 2 times a day.  Patient have to focus to maintain position of palmar abduction Continues to hold off on putty exercises But add yellow Thera-Band for radial deviation as well as wrist extension with a loop for patient to avoid gripping and can do open hand Tolerated well can do twice a day 2 sets of 12. Focus only on thumb palmar radial abduction as well as tendon glides At opposition to all digits picking up 1 cm object as well as picking up objects with interossei digits adduction abduction 10 reps to 3 times a day  PATIENT EDUCATION: Education details: findings of eval and HEP  Person educated: Patient Education method: Explanation, Demonstration, Tactile cues, Verbal cues, and Handouts Education comprehension: verbalized understanding, returned demonstration, verbal cues required, and needs further education   GOALS: Goals reviewed with patient? Yes LONG TERM GOALS: Target date:  8 weeks  Patient to be independent in home program to decrease scar tissue and tenderness to report decrease stiffness tightness and digit range of motion as well as individually tendon glides Baseline: Increased tightness and stiffness in digit flexion.  Scar thick and adhere as well as tender and pain when hitting no knowledge of scar mobilization and massage Goal status: progressing  2.  Wrist active range of motion improved to within normal limits symptom-free today able to push and pull heavy door. Baseline: Patient report pain and tenderness over carpal tunnel scar with pressure or hitting it as well as decreased wrist flexion extension unable to push or pull with hand has increased symptoms in the wrist and forearm Goal status:progressing   3.  Grip and prehension strength in the right improved to within normal range for her age for patient to be able to turn doorknob, cut with a knife, open Ziploc bags and do buttons symptom-free Baseline: Right grip 27 pounds left 50 pounds, lateral pinch right 4 pounds left 12 pounds and 3 point pinch right 6 pounds left 14 pounds NOW grip improved but prehension about same  Goal status: progressing   4.  Patient function on PRWHE improved with more than 10 points in ADLs and IADLs Baseline:  PRWHE score to be done next session Goal status: progressing   ASSESSMENT:  CLINICAL IMPRESSION: Continues to wear brace at nighttime, reports mild numbness to R 1-3 digits and pain with driving. Patient present at OT evaluation with increased scar tissue with increased tenderness and pain in palm to volar forearm with use.  Patient made great progress from start of care and wrist range of motion and strength as well as grip and pinch strength.  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  Writing and on the computer as well as distributing medication.  Patient made great progress from start of care but continued to show  decreased radial deviation and supination pronation.  Unable to maintain midline with gripping.  Patient grip improved from start of care but not prehension..  But continued to be limited mostly in her prehension.  Patient arrived today with increased tightness in left forearm flexors in the palm with decreased wrist extension and digit extension.  But response great on soft tissue mobilization on the forearm and palm.  Patient to continue to do at home.  Patient to hold off on any putty but add yellow Thera-Band for radial deviation and wrist extension open hand to avoid sustained grip.  Continues active motion but including interosseous digit adduction and abduction as well as opposition.  And fitted patient with a CMC neoprene wrap to use during the day with activities that cause pain and discomfort on the thumb thenar eminence and volar wrist.  Patient has appointment with neurosurgery today for cervical and lumbar.?  Bilateral hand numbness in the right forearm weakness coming from cervical.  Patient can benefit from skilled OT services to decrease scar tissue, pain and stiffness and increased motion and strength to return to prior level of function.  Patient did had a MRI to her cervical spine and is meeting with Dr. Louis this Thursday to  review results.  PERFORMANCE DEFICITS: in functional skills including ADLs, IADLs, ROM, strength, pain, flexibility, decreased knowledge of use of DME, and UE functional use,   and psychosocial skills including environmental adaptation and routines and behaviors.   IMPAIRMENTS: are limiting patient from ADLs, IADLs, rest and sleep, play, leisure, and social participation.   COMORBIDITIES: has no other co-morbidities that affects occupational performance. Patient will benefit from skilled OT to address above impairments and improve overall function.  MODIFICATION OR ASSISTANCE TO COMPLETE EVALUATION: No modification of tasks or assist necessary to complete an  evaluation.  OT OCCUPATIONAL PROFILE AND HISTORY: Problem focused assessment: Including review of records relating to presenting problem.  CLINICAL DECISION MAKING: LOW - limited treatment options, no task modification necessary  REHAB POTENTIAL: Good for goals  EVALUATION COMPLEXITY: Low      PLAN:  OT FREQUENCY: 1-2x/week  OT DURATION: 6 weeks  PLANNED INTERVENTIONS: 97168 OT Re-evaluation, 97535 self care/ADL training, 02889 therapeutic exercise, 97530 therapeutic activity, 97112 neuromuscular re-education, 97140 manual therapy, 97035 ultrasound, 97018 paraffin, 02960 fluidotherapy, 97034 contrast bath, scar mobilization, passive range of motion, patient/family education, and DME and/or AE instructions    CONSULTED AND AGREED WITH PLAN OF CARE: Patient    Ancel Peters, OTR/L,CLT 04/02/2024, 1:13 PM

## 2024-04-01 ENCOUNTER — Other Ambulatory Visit: Payer: Self-pay

## 2024-04-01 DIAGNOSIS — H35351 Cystoid macular degeneration, right eye: Secondary | ICD-10-CM | POA: Diagnosis not present

## 2024-04-01 DIAGNOSIS — H2012 Chronic iridocyclitis, left eye: Secondary | ICD-10-CM | POA: Diagnosis not present

## 2024-04-01 DIAGNOSIS — H3023 Posterior cyclitis, bilateral: Secondary | ICD-10-CM | POA: Diagnosis not present

## 2024-04-01 MED ORDER — FOLIC ACID 1 MG PO TABS
1.0000 mg | ORAL_TABLET | Freq: Every day | ORAL | 11 refills | Status: AC
  Filled 2024-04-01: qty 30, 30d supply, fill #0
  Filled 2024-04-29: qty 30, 30d supply, fill #1
  Filled 2024-06-02: qty 30, 30d supply, fill #2

## 2024-04-01 MED ORDER — METHOTREXATE SODIUM 2.5 MG PO TABS
20.0000 mg | ORAL_TABLET | ORAL | 11 refills | Status: AC
  Filled 2024-04-01: qty 32, 28d supply, fill #0
  Filled 2024-04-29: qty 32, 28d supply, fill #1
  Filled 2024-06-02: qty 32, 28d supply, fill #2
  Filled 2024-06-23: qty 32, 28d supply, fill #3

## 2024-04-01 MED ORDER — PREDNISOLONE ACETATE 1 % OP SUSP
OPHTHALMIC | 0 refills | Status: AC
  Filled 2024-04-01: qty 5, 28d supply, fill #0

## 2024-04-01 MED ORDER — KETOROLAC TROMETHAMINE 0.5 % OP SOLN
1.0000 [drp] | Freq: Four times a day (QID) | OPHTHALMIC | 3 refills | Status: AC
  Filled 2024-04-01 – 2024-04-29 (×2): qty 5, 25d supply, fill #0
  Filled 2024-06-02 – 2024-06-23 (×2): qty 5, 25d supply, fill #1

## 2024-04-02 ENCOUNTER — Other Ambulatory Visit: Payer: Self-pay

## 2024-04-02 ENCOUNTER — Ambulatory Visit: Admitting: Physical Therapy

## 2024-04-02 ENCOUNTER — Ambulatory Visit: Admitting: Occupational Therapy

## 2024-04-02 NOTE — Addendum Note (Signed)
 Addended by: ANCEL PETERS on: 04/02/2024 01:15 PM   Modules accepted: Orders

## 2024-04-06 ENCOUNTER — Other Ambulatory Visit: Payer: Self-pay

## 2024-04-06 ENCOUNTER — Ambulatory Visit: Admitting: Occupational Therapy

## 2024-04-06 ENCOUNTER — Ambulatory Visit: Admitting: Physical Therapy

## 2024-04-08 DIAGNOSIS — M47816 Spondylosis without myelopathy or radiculopathy, lumbar region: Secondary | ICD-10-CM | POA: Diagnosis not present

## 2024-04-09 ENCOUNTER — Encounter: Admitting: Physical Therapy

## 2024-04-10 ENCOUNTER — Ambulatory Visit: Admitting: Occupational Therapy

## 2024-04-14 ENCOUNTER — Ambulatory Visit: Admitting: Occupational Therapy

## 2024-04-17 ENCOUNTER — Ambulatory Visit: Attending: Neurosurgery | Admitting: Occupational Therapy

## 2024-04-17 ENCOUNTER — Encounter: Payer: Self-pay | Admitting: Occupational Therapy

## 2024-04-17 DIAGNOSIS — L905 Scar conditions and fibrosis of skin: Secondary | ICD-10-CM | POA: Insufficient documentation

## 2024-04-17 DIAGNOSIS — R208 Other disturbances of skin sensation: Secondary | ICD-10-CM | POA: Diagnosis present

## 2024-04-17 DIAGNOSIS — M6281 Muscle weakness (generalized): Secondary | ICD-10-CM | POA: Diagnosis present

## 2024-04-17 DIAGNOSIS — M5416 Radiculopathy, lumbar region: Secondary | ICD-10-CM | POA: Insufficient documentation

## 2024-04-17 DIAGNOSIS — M25631 Stiffness of right wrist, not elsewhere classified: Secondary | ICD-10-CM | POA: Insufficient documentation

## 2024-04-17 DIAGNOSIS — R2689 Other abnormalities of gait and mobility: Secondary | ICD-10-CM | POA: Diagnosis present

## 2024-04-17 NOTE — Therapy (Signed)
 OUTPATIENT OCCUPATIONAL THERAPY ORTHO TREATMENT  Patient Name: Tricia Hill MRN: [id number] DOB:1970/08/31, 53 y.o., female Today's Date: 04/17/2024  PCP: Dr Vicci MART PROVIDER: Dr Louis  END OF SESSION:  OT End of Session - 04/17/24 1001     Visit Number 9    Number of Visits 16    Date for Recertification  05/19/24    OT Start Time 0950    OT Stop Time 1030    OT Time Calculation (min) 40 min    Activity Tolerance Patient tolerated treatment well    Behavior During Therapy [facility] for tasks assessed/performed          Past Medical History:  Diagnosis Date   Allergy 2012   Anxiety    Arthritis    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   Asthma    Diabetes mellitus without complication (HCC) 2016   GERD (gastroesophageal reflux disease)    Heart murmur    child   History of kidney stones    Hypertension    Neuromuscular disorder (HCC)    right hands tingling and numbness   PONV (postoperative nausea and vomiting)    Shingles    Sleep apnea    USE CPAP   Past Surgical History:  Procedure Laterality Date   ANTERIOR CERVICAL DECOMP/DISCECTOMY FUSION N/A 04/13/2022   Procedure: Anterior Cervical Discectomy Fusion - Cervical seven-Thoracic one removal of plate;  Surgeon: Louis Shove, MD;  Location[facility];  Service: Neurosurgery;  Laterality: N/A;   Carpel Tunnel Surgery Right 10/01/2023   CESAREAN SECTION     X 2   CHOLECYSTECTOMY N/A 01/18/2017   Procedure: LAPAROSCOPIC CHOLECYSTECTOMY WITH INTRAOPERATIVE CHOLANGIOGRAM;  Surgeon: Dessa Reyes ORN, MD;  Location: [facility] ORS;  Service: General;  Laterality: N/A;   COLONOSCOPY WITH PROPOFOL  N/A 10/13/2020   Procedure: COLONOSCOPY WITH PROPOFOL ;  Surgeon: Unk Corinn Skiff, MD;  Location: [facility] ENDOSCOPY;  Service: Gastroenterology;  Laterality: N/A;  COVID POSITIVE 09/30/2020   CYSTOSCOPY/URETEROSCOPY/HOLMIUM LASER/STENT PLACEMENT Left 08/21/2022   Procedure: CYSTOSCOPY/URETEROSCOPY/HOLMIUM LASER/STENT PLACEMENT;   Surgeon: Twylla Glendia BROCKS, MD;  Location: [facility] ORS;  Service: Urology;  Laterality: Left;   DIAGNOSTIC LAPAROSCOPY  2006   EXCISION OF ABDOMINAL WALL ENDOMETRIOMA   EXTRACORPOREAL SHOCK WAVE LITHOTRIPSY Left 07/12/2022   Procedure: EXTRACORPOREAL SHOCK WAVE LITHOTRIPSY (ESWL);  Surgeon: Penne Knee, MD;  Location: [facility] ORS;  Service: Urology;  Laterality: Left;   SPINAL FUSION  2012   C3 and C4   TOTAL ABDOMINAL HYSTERECTOMY     Partial   Patient Active Problem List   Diagnosis Date Noted   Chronic anterior uveitis of left eye 02/05/2024   Chronic low back pain 12/03/2023   Disorder of right median nerve 12/03/2023   Lumbar radiculopathy 12/03/2023   Numbness of hand 12/03/2023   History of carpal tunnel surgery of right wrist 10/01/2023   Uveitis 07/11/2023   Numbness of upper limb 05/28/2023   Pain in right foot 05/28/2023   Cervical spondylosis with myelopathy and radiculopathy 04/13/2022   Osteoarthritis of spine with radiculopathy, cervical region 02/08/2022   Osteoarthritis of spine with radiculopathy, thoracic region 02/08/2022   Anxiety 09/05/2021   HTN (hypertension) 04/16/2018   Fatty liver 12/07/2016   Microscopic hematuria 05/26/2015   OSA (obstructive sleep apnea) 01/31/2015   S/P hysterectomy 01/31/2015   Controlled diabetes mellitus type 2 with complications (HCC) 01/31/2015   Morbid obesity (HCC) 01/31/2015   Hyperlipidemia associated with type 2 diabetes mellitus (HCC) 01/31/2015    ONSET DATE: 10/01/23  REFERRING DIAG: R CTR  THERAPY DIAG:  Stiffness of right wrist, not elsewhere classified  Rationale for Evaluation and Treatment: Rehabilitation  SUBJECTIVE:   SUBJECTIVE STATEMENT: Reports she has concerns with cervical spine from neurosurgery, looking for a second opinion.   PERTINENT HISTORY: DR Louis note: The patient returns today in follow-up. She continues to have some right him discomfort as she recover some carpal tunnel surgery. She has not  started her physical therapy. She also notes persistent posterior cervical pain with some radiation. She is having no definite weakness. Her lumbar pain is stable. She did not get much improvement following recent epidural steroid injection   PRECAUTIONS: None    WEIGHT BEARING RESTRICTIONS: No  PAIN:  Are you having pain?  Increased pain and tenderness over the carpal tunnel and volar forearm 2/10 at rest   FALLS: Has patient fallen in last 6 months? 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 On light duty.  Does house stuff around with cooking and cleaning and laundry.  On her phone.  Reads some.  Has 2 grownup kids and a 29 -year-old  PATIENT GOALS: I just want the numbness better and the pain but then also more strength so I can do my job as a engineer, civil (consulting)  NEXT MD VISIT:   OBJECTIVE:  Note: Objective measures were completed at Evaluation unless otherwise noted.  HAND DOMINANCE: Right  ADLs: Difficulty with cutting food, cooking doing laundry.  Putting on jewelry, do buttons, opening jars or packages or Ziploc bags: Cannot grip or hold things I drop things  FUNCTIONAL OUTCOME MEASURES: To be done excision  UPPER EXTREMITY ROM:     Active ROM Right eval Left eval  Shoulder flexion    Shoulder abduction    Shoulder adduction    Shoulder extension    Shoulder internal rotation    Shoulder external rotation    Elbow flexion    Elbow extension    Wrist flexion 70   Wrist extension 64   Wrist ulnar deviation    Wrist radial deviation    Wrist pronation    Wrist supination    (Blank rows = not tested)  Active ROM Right eval Left eval  Thumb MCP (0-60)    Thumb IP (0-80)    Thumb Radial abd/add (0-55)     Thumb Palmar abd/add (0-45)     Thumb Opposition to Small Finger     Index MCP (0-90)     Index PIP (0-100)     Index DIP (0-70)      Long MCP (0-90)      Long PIP (0-100)      Long DIP  (0-70)      Ring MCP (0-90)      Ring PIP (0-100)      Ring DIP (0-70)      Little MCP (0-90)      Little PIP (0-100)      Little DIP (0-70)      Digit flexion within normal limits but tight when attempting to make a fist. Opposition within normal limits. Thumb palmar radial abduction within normal limits  HAND FUNCTION: Grip strength: Right: 27 lbs; Left: 50 lbs, Lateral pinch: Right: 4 lbs, Left: 12 lbs, and 3 point pinch: Right: 6 lbs, Left: 14 lbs 02/06/24: Grip strength: Right: 29 lbs; Left: 50 lbs, Lateral pinch: Right: 4 lbs, Left: 12 lbs, and 3 point pinch: Right:  6 lbs, Left: 14 lbs  02/11/24: Grip strength: Right: 42 lbs; Left: 50 lbs, Lateral pinch: Right: 5 lbs, Left: 12 lbs, and 3 point pinch: Right: 7 lbs, Left: 14 lbs 03/03/24: Grip strength: Right: 44 lbs; Left: 50 lbs, Lateral pinch: Right: 6 lbs, Left: 12 lbs, and 3 point pinch: Right: 7 lbs, Left: 14 lbs 04/17/24: Grip strength: Right: 42 lbs; Left: 64 lbs, Lateral pinch: Right: 4 lbs, Left: 13 lbs, and 3 point pinch: Right: 6 lbs, Left: 14 lbs  COORDINATION: Decrease in limited because of numbness in digits at times  SENSATION: Patient report increased numbness from thumb through fourth digit on and off during the day and night  EDEMA: Not noticeable.  COGNITION: Overall cognitive status: Within functional limits for tasks assessed      TREATMENT DATE: 04/17/24                                                                                                                             Patient arrived with continues increased tightness in the forearm flexors, tenderness over the carpal tunnel and decreased wrist and digit extension.   Patient continues to have decreased strength in ECR as well as supination pronation Upon assessment patient's R grip and prehension decreased. Issued soft wrist brace for L hand due to increased numbness in L hand.  Reviewed scar massage with pt reporting daughter assisting at home,  completed 1 set circular massage and rolling over scar. Praffin bath 8 min to decrease pain in R wrist/hand prior to soft tissue mobilization.  Graston tool #2 for sweeping and fan strokes along volar forearm  Dry static cupping over volar forearm with 10 reps wrist/digit extension x3 sets  Recommend for patient to focus on the forearm.  As well as stay away from a sustained tight grip when on the phone or holding objects. Pick up with her palmar forearm.   Continue with: tendon glides 12 reps Patient to continue with rubber band for resistance for palmar radial abduction 3 sets of 12 2 times a day.  Patient have to focus to maintain position of palmar abduction Continues to hold off on putty exercises But add yellow Thera-Band for radial deviation as well as wrist extension with a loop for patient to avoid gripping and can do open hand Tolerated well can do twice a day 2 sets of 12. Focus only on thumb palmar radial abduction as well as tendon glides At opposition to all digits picking up 1 cm object as well as picking up objects with interossei digits adduction abduction 10 reps to 3 times a day     PATIENT EDUCATION: Education details: findings of eval and HEP  Person educated: Patient Education method: Explanation, Demonstration, Tactile cues, Verbal cues, and Handouts Education comprehension: verbalized understanding, returned demonstration, verbal cues required, and needs further education   GOALS: Goals reviewed with patient? Yes LONG TERM GOALS: Target date: 8 weeks  Patient to be independent  in home program to decrease scar tissue and tenderness to report decrease stiffness tightness and digit range of motion as well as individually tendon glides Baseline: Increased tightness and stiffness in digit flexion.  Scar thick and adhere as well as tender and pain when hitting no knowledge of scar mobilization and massage Goal status: progressing  2.  Wrist active range of motion  improved to within normal limits symptom-free today able to push and pull heavy door. Baseline: Patient report pain and tenderness over carpal tunnel scar with pressure or hitting it as well as decreased wrist flexion extension unable to push or pull with hand has increased symptoms in the wrist and forearm Goal status:progressing   3.  Grip and prehension strength in the right improved to within normal range for her age for patient to be able to turn doorknob, cut with a knife, open Ziploc bags and do buttons symptom-free Baseline: Right grip 27 pounds left 50 pounds, lateral pinch right 4 pounds left 12 pounds and 3 point pinch right 6 pounds left 14 pounds NOW grip improved but prehension about same  Goal status: progressing   4.  Patient function on PRWHE improved with more than 10 points in ADLs and IADLs Baseline:  PRWHE score to be done next session Goal status: progressing   ASSESSMENT:  CLINICAL IMPRESSION: Continues to wear brace at nighttime, reports mild numbness to R 1-3 digits and pain with driving. Patient present at OT evaluation with increased scar tissue with increased tenderness and pain in palm to volar forearm with use.  Patient made great progress from start of care and wrist range of motion and strength as well as grip and pinch strength.  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  Writing and on the computer as well as distributing medication.    NOW: Pt reports she has not been doing her home exercises due to her son moving out and her sister moving in. Pt continued to show decreased grip and prehension strength. Patient arrived today with increased tightness in left forearm flexors in the palm with decreased wrist extension and digit extension.  Tightness and pain improved with soft tissue mobilization on the forearm and palm. Recent neurosurgery appointment showed continue concern of carpal tunnel, pt plan to find second  opinion due to concern that bilateral hand numbness / weakness coming from cervical spine. Issued soft wrist brace for L hand due to increased numbness in L hand. Patient can benefit from skilled OT services to decrease scar tissue, pain and stiffness and increased motion and strength to return to prior level of function.  Patient did had a MRI to her cervical spine and is meeting with Dr. Louis this Thursday to review results.  PERFORMANCE DEFICITS: in functional skills including ADLs, IADLs, ROM, strength, pain, flexibility, decreased knowledge of use of DME, and UE functional use,   and psychosocial skills including environmental adaptation and routines and behaviors.   IMPAIRMENTS: are limiting patient from ADLs, IADLs, rest and sleep, play, leisure, and social participation.   COMORBIDITIES: has no other co-morbidities that affects occupational performance. Patient will benefit from skilled OT to address above impairments and improve overall function.  MODIFICATION OR ASSISTANCE TO COMPLETE EVALUATION: No modification of tasks or assist necessary to complete an evaluation.  OT OCCUPATIONAL PROFILE AND HISTORY: Problem focused assessment: Including review of records relating to presenting problem.  CLINICAL DECISION MAKING: LOW - limited treatment options, no task modification necessary  REHAB POTENTIAL: Good for goals  EVALUATION COMPLEXITY: Low      PLAN:  OT FREQUENCY: 1-2x/week  OT DURATION: 6 weeks  PLANNED INTERVENTIONS: 97168 OT Re-evaluation, 97535 self care/ADL training, 02889 therapeutic exercise, 97530 therapeutic activity, 97112 neuromuscular re-education, 97140 manual therapy, 97035 ultrasound, 97018 paraffin, 02960 fluidotherapy, 97034 contrast bath, scar mobilization, passive range of motion, patient/family education, and DME and/or AE instructions    CONSULTED AND AGREED WITH PLAN OF CARE: Patient    Elston JINNY Slot, OTR/L 04/17/2024, 10:02 AM

## 2024-04-21 ENCOUNTER — Ambulatory Visit: Admitting: Occupational Therapy

## 2024-04-21 DIAGNOSIS — M25631 Stiffness of right wrist, not elsewhere classified: Secondary | ICD-10-CM

## 2024-04-21 DIAGNOSIS — M5416 Radiculopathy, lumbar region: Secondary | ICD-10-CM

## 2024-04-21 DIAGNOSIS — L905 Scar conditions and fibrosis of skin: Secondary | ICD-10-CM

## 2024-04-21 DIAGNOSIS — M6281 Muscle weakness (generalized): Secondary | ICD-10-CM

## 2024-04-21 NOTE — Therapy (Signed)
 OUTPATIENT OCCUPATIONAL THERAPY ORTHO TREATMENT  Patient Name: Tricia Hill MRN: [id number] DOB:12/10/1970, 53 y.o., female Today's Date: 04/21/2024  PCP: Dr Vicci MART PROVIDER: Dr Louis  END OF SESSION:  OT End of Session - 04/21/24 1002     Visit Number 10    Number of Visits 16    Date for Recertification  05/19/24    OT Start Time 0958    OT Stop Time 1035    OT Time Calculation (min) 37 min    Activity Tolerance Patient tolerated treatment well    Behavior During Therapy [facility] for tasks assessed/performed          Past Medical History:  Diagnosis Date   Allergy 2012   Anxiety    Arthritis    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   Asthma    Diabetes mellitus without complication (HCC) 2016   GERD (gastroesophageal reflux disease)    Heart murmur    child   History of kidney stones    Hypertension    Neuromuscular disorder (HCC)    right hands tingling and numbness   PONV (postoperative nausea and vomiting)    Shingles    Sleep apnea    USE CPAP   Past Surgical History:  Procedure Laterality Date   ANTERIOR CERVICAL DECOMP/DISCECTOMY FUSION N/A 04/13/2022   Procedure: Anterior Cervical Discectomy Fusion - Cervical seven-Thoracic one removal of plate;  Surgeon: Louis Shove, MD;  Location: [facility] OR;  Service: Neurosurgery;  Laterality: N/A;   Carpel Tunnel Surgery Right 10/01/2023   CESAREAN SECTION     X 2   CHOLECYSTECTOMY N/A 01/18/2017   Procedure: LAPAROSCOPIC CHOLECYSTECTOMY WITH INTRAOPERATIVE CHOLANGIOGRAM;  Surgeon: Dessa Reyes ORN, MD;  Location: [facility] ORS;  Service: General;  Laterality: N/A;   COLONOSCOPY WITH PROPOFOL  N/A 10/13/2020   Procedure: COLONOSCOPY WITH PROPOFOL ;  Surgeon: Unk Corinn Skiff, MD;  Location: [facility] ENDOSCOPY;  Service: Gastroenterology;  Laterality: N/A;  COVID POSITIVE 09/30/2020   CYSTOSCOPY/URETEROSCOPY/HOLMIUM LASER/STENT PLACEMENT Left 08/21/2022   Procedure: CYSTOSCOPY/URETEROSCOPY/HOLMIUM LASER/STENT PLACEMENT;   Surgeon: Twylla Glendia BROCKS, MD;  Location: [facility] ORS;  Service: Urology;  Laterality: Left;   DIAGNOSTIC LAPAROSCOPY  2006   EXCISION OF ABDOMINAL WALL ENDOMETRIOMA   EXTRACORPOREAL SHOCK WAVE LITHOTRIPSY Left 07/12/2022   Procedure: EXTRACORPOREAL SHOCK WAVE LITHOTRIPSY (ESWL);  Surgeon: Penne Knee, MD;  Location: [facility] ORS;  Service: Urology;  Laterality: Left;   SPINAL FUSION  2012   C3 and C4   TOTAL ABDOMINAL HYSTERECTOMY     Partial   Patient Active Problem List   Diagnosis Date Noted   Chronic anterior uveitis of left eye 02/05/2024   Chronic low back pain 12/03/2023   Disorder of right median nerve 12/03/2023   Lumbar radiculopathy 12/03/2023   Numbness of hand 12/03/2023   History of carpal tunnel surgery of right wrist 10/01/2023   Uveitis 07/11/2023   Numbness of upper limb 05/28/2023   Pain in right foot 05/28/2023   Cervical spondylosis with myelopathy and radiculopathy 04/13/2022   Osteoarthritis of spine with radiculopathy, cervical region 02/08/2022   Osteoarthritis of spine with radiculopathy, thoracic region 02/08/2022   Anxiety 09/05/2021   HTN (hypertension) 04/16/2018   Fatty liver 12/07/2016   Microscopic hematuria 05/26/2015   OSA (obstructive sleep apnea) 01/31/2015   S/P hysterectomy 01/31/2015   Controlled diabetes mellitus type 2 with complications (HCC) 01/31/2015   Morbid obesity (HCC) 01/31/2015   Hyperlipidemia associated with type 2 diabetes mellitus (HCC) 01/31/2015    ONSET DATE: 10/01/23  REFERRING DIAG: R CTR  THERAPY DIAG:  Stiffness of right wrist, not elsewhere classified  Radiculopathy, lumbar region  Muscle weakness (generalized)  Scar tissue  Rationale for Evaluation and Treatment: Rehabilitation  SUBJECTIVE:   SUBJECTIVE STATEMENT: Reports she has concerns with cervical spine from neurosurgery, looking for a second opinion.   PERTINENT HISTORY: DR Louis note: The patient returns today in follow-up. She continues to have  some right him discomfort as she recover some carpal tunnel surgery. She has not started her physical therapy. She also notes persistent posterior cervical pain with some radiation. She is having no definite weakness. Her lumbar pain is stable. She did not get much improvement following recent epidural steroid injection   PRECAUTIONS: None    WEIGHT BEARING RESTRICTIONS: No  PAIN:  Are you having pain?  Tenderness over A1 pulley of right thumb.  Some discomfort on the ulnar forearm with resistance to ulnar deviation and supination  FALLS: Has patient fallen in last 6 months? 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 On light duty.  Does house stuff around with cooking and cleaning and laundry.  On her phone.  Reads some.  Has 2 grownup kids and a 5 -year-old  PATIENT GOALS: I just want the numbness better and the pain but then also more strength so I can do my job as a engineer, civil (consulting)  NEXT MD VISIT:   OBJECTIVE:  Note: Objective measures were completed at Evaluation unless otherwise noted.  HAND DOMINANCE: Right  ADLs: Difficulty with cutting food, cooking doing laundry.  Putting on jewelry, do buttons, opening jars or packages or Ziploc bags: Cannot grip or hold things I drop things  FUNCTIONAL OUTCOME MEASURES: To be done excision  UPPER EXTREMITY ROM:     Active ROM Right eval Left eval R 04/21/24  Shoulder flexion     Shoulder abduction     Shoulder adduction     Shoulder extension     Shoulder internal rotation     Shoulder external rotation     Elbow flexion     Elbow extension     Wrist flexion 70  90  Wrist extension 64  70  Wrist ulnar deviation   30 pain ulnar forearm  Wrist radial deviation   20  Wrist pronation   90  Wrist supination   90  (Blank rows = not tested)  Active ROM Right eval Left eval  Thumb MCP (0-60)    Thumb IP (0-80)    Thumb Radial abd/add (0-55)     Thumb  Palmar abd/add (0-45)     Thumb Opposition to Small Finger     Index MCP (0-90)     Index PIP (0-100)     Index DIP (0-70)      Long MCP (0-90)      Long PIP (0-100)      Long DIP (0-70)      Ring MCP (0-90)      Ring PIP (0-100)      Ring DIP (0-70)      Little MCP (0-90)      Little PIP (0-100)      Little DIP (0-70)      Digit flexion within normal limits but tight when attempting to make a fist. Opposition within normal limits.  To base of fifth-some discomfort at the A1 pulley of thumb MCP Thumb palmar radial abduction within normal limits  HAND  FUNCTION: Grip strength: Right: 27 lbs; Left: 50 lbs, Lateral pinch: Right: 4 lbs, Left: 12 lbs, and 3 point pinch: Right: 6 lbs, Left: 14 lbs 02/06/24: Grip strength: Right: 29 lbs; Left: 50 lbs, Lateral pinch: Right: 4 lbs, Left: 12 lbs, and 3 point pinch: Right: 6 lbs, Left: 14 lbs  02/11/24: Grip strength: Right: 42 lbs; Left: 50 lbs, Lateral pinch: Right: 5 lbs, Left: 12 lbs, and 3 point pinch: Right: 7 lbs, Left: 14 lbs 03/03/24: Grip strength: Right: 44 lbs; Left: 50 lbs, Lateral pinch: Right: 6 lbs, Left: 12 lbs, and 3 point pinch: Right: 7 lbs, Left: 14 lbs 04/17/24: Grip strength: Right: 42 lbs; Left: 64 lbs, Lateral pinch: Right: 4 lbs, Left: 13 lbs, and 3 point pinch: Right: 6 lbs, Left: 14 lbs  COORDINATION: Decrease in limited because of numbness in digits at times  SENSATION: Patient report increased numbness from thumb through fourth digit on and off during the day and night  EDEMA: Not noticeable.  COGNITION: Overall cognitive status: Within functional limits for tasks assessed      TREATMENT DATE: 04/21/24                                                                                                                                Upon assessment patient's R grip and prehension continues to be decreased Especially lateral 3-point pinch not improving. Initiated strengthening again with patient this date Close  independent of 8 pounds provided for lateral 3-point pinch-patient to use left hand to apply.  Intake of with the right 12 reps 3 times a day each Patient can use [facility] neoprene for above exercise   Praffin bath 8 min to decrease pain in R wrist/hand and forearm prior to soft tissue mobilization.  Graston tool #2 for sweeping and fan strokes along volar forearm  Dry static cupping over volar forearm proximal and mid ; as well as proximal and palmar with 10 reps wrist/digit extension x3 sets  Recommend for patient to focus on the forearm.  As well as stay away from a sustained tight grip when on the phone or holding objects. Pick up with her palmar forearm.   Continue with: tendon glides 12 reps Prayer stretch for composite wrist extension 10 reps Followed by ulnar nerve glide 5 reps Do several times during the day if feeling a cramp or spasm in the volar forearm and hand Also provided patient with a composite nerve glide to do several times during the day reps  Focus only on thumb palmar radial abduction as well as tendon glides     PATIENT EDUCATION: Education details: findings of eval and HEP  Person educated: Patient Education method: Explanation, Demonstration, Tactile cues, Verbal cues, and Handouts Education comprehension: verbalized understanding, returned demonstration, verbal cues required, and needs further education   GOALS: Goals reviewed with patient? Yes LONG TERM GOALS: Target date: 8 weeks  Patient to be independent in home program  to decrease scar tissue and tenderness to report decrease stiffness tightness and digit range of motion as well as individually tendon glides Baseline: Increased tightness and stiffness in digit flexion.  Scar thick and adhere as well as tender and pain when hitting no knowledge of scar mobilization and massage Goal status: progressing  2.  Wrist active range of motion improved to within normal limits symptom-free today able to push and  pull heavy door. Baseline: Patient report pain and tenderness over carpal tunnel scar with pressure or hitting it as well as decreased wrist flexion extension unable to push or pull with hand has increased symptoms in the wrist and forearm Goal status:progressing   3.  Grip and prehension strength in the right improved to within normal range for her age for patient to be able to turn doorknob, cut with a knife, open Ziploc bags and do buttons symptom-free Baseline: Right grip 27 pounds left 50 pounds, lateral pinch right 4 pounds left 12 pounds and 3 point pinch right 6 pounds left 14 pounds NOW grip improved but prehension about same  Goal status: progressing   4.  Patient function on PRWHE improved with more than 10 points in ADLs and IADLs Baseline:  PRWHE score to be done next session Goal status: progressing   ASSESSMENT:  CLINICAL IMPRESSION: Continues to wear brace at nighttime, reports mild numbness to R 1-3 digits and pain with driving. Patient present at OT evaluation with increased scar tissue with increased tenderness and pain in palm to volar forearm with use.  Patient made great progress from start of care and wrist range of motion and strength as well as grip and pinch strength.  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  Writing and on the computer as well as distributing medication.    NOW: Pt continued to show decreased grip and prehension strength.  Patient with some discomfort in tenderness over the A1 pulley of the right thumb as well as ulnar forearm wrist tightness or discomfort with strength assessment.  Provided patient with a 8 pound clothing.  For lateral 3-point pinch.  Patient to focus on composite forearm extension stretch followed by ulnar nerve glide.  Provide patient also with a composite nerve glide to do several times during the day.  Forearm symptoms improve with soft tissue mobilization on the forearm and palm. Recent  neurosurgery appointment showed continue concern of carpal tunnel, pt plan to find second opinion due to concern that bilateral hand numbness / weakness coming from cervical spine. Patient can benefit from skilled OT services to decrease scar tissue, pain and stiffness and increased motion and strength to return to prior level of function.   PERFORMANCE DEFICITS: in functional skills including ADLs, IADLs, ROM, strength, pain, flexibility, decreased knowledge of use of DME, and UE functional use,   and psychosocial skills including environmental adaptation and routines and behaviors.   IMPAIRMENTS: are limiting patient from ADLs, IADLs, rest and sleep, play, leisure, and social participation.   COMORBIDITIES: has no other co-morbidities that affects occupational performance. Patient will benefit from skilled OT to address above impairments and improve overall function.  MODIFICATION OR ASSISTANCE TO COMPLETE EVALUATION: No modification of tasks or assist necessary to complete an evaluation.  OT OCCUPATIONAL PROFILE AND HISTORY: Problem focused assessment: Including review of records relating to presenting problem.  CLINICAL DECISION MAKING: LOW - limited treatment options, no task modification necessary  REHAB POTENTIAL: Good for goals  EVALUATION COMPLEXITY:  Low      PLAN:  OT FREQUENCY: 1-2x/week  OT DURATION: 6 weeks  PLANNED INTERVENTIONS: 97168 OT Re-evaluation, 97535 self care/ADL training, 02889 therapeutic exercise, 97530 therapeutic activity, 97112 neuromuscular re-education, 97140 manual therapy, 97035 ultrasound, 97018 paraffin, 02960 fluidotherapy, 97034 contrast bath, scar mobilization, passive range of motion, patient/family education, and DME and/or AE instructions    CONSULTED AND AGREED WITH PLAN OF CARE: Patient    Ancel Peters, OTR/L 04/21/2024, 7:27 PM

## 2024-04-24 ENCOUNTER — Ambulatory Visit: Admitting: Occupational Therapy

## 2024-04-24 DIAGNOSIS — R208 Other disturbances of skin sensation: Secondary | ICD-10-CM

## 2024-04-24 DIAGNOSIS — M25631 Stiffness of right wrist, not elsewhere classified: Secondary | ICD-10-CM | POA: Diagnosis not present

## 2024-04-24 DIAGNOSIS — L905 Scar conditions and fibrosis of skin: Secondary | ICD-10-CM

## 2024-04-24 DIAGNOSIS — M6281 Muscle weakness (generalized): Secondary | ICD-10-CM

## 2024-04-24 DIAGNOSIS — R2689 Other abnormalities of gait and mobility: Secondary | ICD-10-CM

## 2024-04-24 NOTE — Therapy (Signed)
 OUTPATIENT OCCUPATIONAL THERAPY ORTHO TREATMENT  Patient Name: Tricia Hill MRN: [id number] DOB:[birth year], 53 y.o., female Today's Date: 04/24/2024  PCP: Dr Vicci MART PROVIDER: Dr Louis  END OF SESSION:  OT End of Session - 04/24/24 1520     Visit Number 11    Number of Visits 16    Date for Recertification  05/19/24    OT Start Time 1045    OT Stop Time 1131    OT Time Calculation (min) 46 min    Activity Tolerance Patient tolerated treatment well    Behavior During Therapy [facility] for tasks assessed/performed          Past Medical History:  Diagnosis Date   Allergy 2012   Anxiety    Arthritis    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   Asthma    Diabetes mellitus without complication (HCC) 2016   GERD (gastroesophageal reflux disease)    Heart murmur    child   History of kidney stones    Hypertension    Neuromuscular disorder (HCC)    right hands tingling and numbness   PONV (postoperative nausea and vomiting)    Shingles    Sleep apnea    USE CPAP   Past Surgical History:  Procedure Laterality Date   ANTERIOR CERVICAL DECOMP/DISCECTOMY FUSION N/A 04/13/2022   Procedure: Anterior Cervical Discectomy Fusion - Cervical seven-Thoracic one removal of plate;  Surgeon: Louis Shove, MD;  Location: [facility] OR;  Service: Neurosurgery;  Laterality: N/A;   Carpel Tunnel Surgery Right 10/01/2023   CESAREAN SECTION     X 2   CHOLECYSTECTOMY N/A 01/18/2017   Procedure: LAPAROSCOPIC CHOLECYSTECTOMY WITH INTRAOPERATIVE CHOLANGIOGRAM;  Surgeon: Dessa Reyes ORN, MD;  Location: [facility] ORS;  Service: General;  Laterality: N/A;   COLONOSCOPY WITH PROPOFOL  N/A 10/13/2020   Procedure: COLONOSCOPY WITH PROPOFOL ;  Surgeon: Unk Corinn Skiff, MD;  Location: [facility] ENDOSCOPY;  Service: Gastroenterology;  Laterality: N/A;  COVID POSITIVE 09/30/2020   CYSTOSCOPY/URETEROSCOPY/HOLMIUM LASER/STENT PLACEMENT Left 08/21/2022   Procedure: CYSTOSCOPY/URETEROSCOPY/HOLMIUM LASER/STENT PLACEMENT;   Surgeon: Twylla Glendia BROCKS, MD;  Location: [facility] ORS;  Service: Urology;  Laterality: Left;   DIAGNOSTIC LAPAROSCOPY  2006   EXCISION OF ABDOMINAL WALL ENDOMETRIOMA   EXTRACORPOREAL SHOCK WAVE LITHOTRIPSY Left 07/12/2022   Procedure: EXTRACORPOREAL SHOCK WAVE LITHOTRIPSY (ESWL);  Surgeon: Penne Knee, MD;  Location: [facility] ORS;  Service: Urology;  Laterality: Left;   SPINAL FUSION  2012   C3 and C4   TOTAL ABDOMINAL HYSTERECTOMY     Partial   Patient Active Problem List   Diagnosis Date Noted   Chronic anterior uveitis of left eye 02/05/2024   Chronic low back pain 12/03/2023   Disorder of right median nerve 12/03/2023   Lumbar radiculopathy 12/03/2023   Numbness of hand 12/03/2023   History of carpal tunnel surgery of right wrist 10/01/2023   Uveitis 07/11/2023   Numbness of upper limb 05/28/2023   Pain in right foot 05/28/2023   Cervical spondylosis with myelopathy and radiculopathy 04/13/2022   Osteoarthritis of spine with radiculopathy, cervical region 02/08/2022   Osteoarthritis of spine with radiculopathy, thoracic region 02/08/2022   Anxiety 09/05/2021   HTN (hypertension) 04/16/2018   Fatty liver 12/07/2016   Microscopic hematuria 05/26/2015   OSA (obstructive sleep apnea) 01/31/2015   S/P hysterectomy 01/31/2015   Controlled diabetes mellitus type 2 with complications (HCC) 01/31/2015   Morbid obesity (HCC) 01/31/2015   Hyperlipidemia associated with type 2 diabetes mellitus (HCC) 01/31/2015    ONSET DATE: 10/01/23  REFERRING DIAG: R CTR  THERAPY DIAG:  Stiffness of right wrist, not elsewhere classified  Muscle weakness (generalized)  Scar tissue  Other abnormalities of gait and mobility  Other disturbances of skin sensation  Rationale for Evaluation and Treatment: Rehabilitation  SUBJECTIVE:   SUBJECTIVE STATEMENT: My palm of my thumb side was hurting some the day after I left her last time.  My pinky felt stiff today  PERTINENT HISTORY: DR Louis note:  The patient returns today in follow-up. She continues to have some right him discomfort as she recover some carpal tunnel surgery. She has not started her physical therapy. She also notes persistent posterior cervical pain with some radiation. She is having no definite weakness. Her lumbar pain is stable. She did not get much improvement following recent epidural steroid injection   PRECAUTIONS: None    WEIGHT BEARING RESTRICTIONS: No  PAIN:  Are you having pain?  Tenderness over carpal tunnel scar  FALLS: Has patient fallen in last 6 months? 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 On light duty.  Does house stuff around with cooking and cleaning and laundry.  On her phone.  Reads some.  Has 2 grownup kids and a 61 -year-old  PATIENT GOALS: I just want the numbness better and the pain but then also more strength so I can do my job as a engineer, civil (consulting)  NEXT MD VISIT:   OBJECTIVE:  Note: Objective measures were completed at Evaluation unless otherwise noted.  HAND DOMINANCE: Right  ADLs: Difficulty with cutting food, cooking doing laundry.  Putting on jewelry, do buttons, opening jars or packages or Ziploc bags: Cannot grip or hold things I drop things  FUNCTIONAL OUTCOME MEASURES: To be done excision  UPPER EXTREMITY ROM:     Active ROM Right eval Left eval R 04/21/24  Shoulder flexion     Shoulder abduction     Shoulder adduction     Shoulder extension     Shoulder internal rotation     Shoulder external rotation     Elbow flexion     Elbow extension     Wrist flexion 70  90  Wrist extension 64  70  Wrist ulnar deviation   30 pain ulnar forearm  Wrist radial deviation   20  Wrist pronation   90  Wrist supination   90  (Blank rows = not tested)  Active ROM Right eval Left eval  Thumb MCP (0-60)    Thumb IP (0-80)    Thumb Radial abd/add (0-55)     Thumb Palmar abd/add (0-45)     Thumb  Opposition to Small Finger     Index MCP (0-90)     Index PIP (0-100)     Index DIP (0-70)      Long MCP (0-90)      Long PIP (0-100)      Long DIP (0-70)      Ring MCP (0-90)      Ring PIP (0-100)      Ring DIP (0-70)      Little MCP (0-90)      Little PIP (0-100)      Little DIP (0-70)      Digit flexion within normal limits but tight when attempting to make a fist. Opposition within normal limits.  To base of fifth-some discomfort at the A1 pulley of thumb MCP Thumb palmar radial abduction within normal limits  HAND FUNCTION: Grip strength: Right: 27 lbs; Left: 50 lbs, Lateral pinch: Right: 4 lbs, Left: 12 lbs, and 3 point pinch: Right: 6 lbs, Left: 14 lbs 02/06/24: Grip strength: Right: 29 lbs; Left: 50 lbs, Lateral pinch: Right: 4 lbs, Left: 12 lbs, and 3 point pinch: Right: 6 lbs, Left: 14 lbs  02/11/24: Grip strength: Right: 42 lbs; Left: 50 lbs, Lateral pinch: Right: 5 lbs, Left: 12 lbs, and 3 point pinch: Right: 7 lbs, Left: 14 lbs 03/03/24: Grip strength: Right: 44 lbs; Left: 50 lbs, Lateral pinch: Right: 6 lbs, Left: 12 lbs, and 3 point pinch: Right: 7 lbs, Left: 14 lbs 04/17/24: Grip strength: Right: 42 lbs; Left: 64 lbs, Lateral pinch: Right: 4 lbs, Left: 13 lbs, and 3 point pinch: Right: 6 lbs, Left: 14 lbs  COORDINATION: Decrease in limited because of numbness in digits at times  SENSATION: Patient report increased numbness from thumb through fourth digit on and off during the day and night  EDEMA: Not noticeable.  COGNITION: Overall cognitive status: Within functional limits for tasks assessed      TREATMENT DATE: 04/23/24                                                                                                                                Upon assessment patient's R grip and prehension continues to be decreased Especially lateral /3-point pinch not improving. Initiated strengthening again with patient last visit but 8 pounds clothing pen was too much  resistance causing some discomfort and pain at the radial thenar eminence and thumb.  Assessed with patient and provide 6 pounds close and pain to use at home for lateral and 3-point pinch-patient had more ease on pinching placing it on and off no pad 12 reps 3 times a day each Patient can use [facility] neoprene for above exercise   Paraffin bath 8 min to decrease pain, scar tissue and stiffness prior to scar mobilization and stretches Graston tool #6 done for brushing on carpal tunnel scar followed by manual therapy by OT responded great tolerated well  Graston #2 for sweeping and fan strokes along volar forearm  Dry static cupping over volar forearm proximal and mid ; as well as proximal and palmar with 10 reps wrist/digit extension x3 sets Followed by composite forearm flexor stretch And facilitating 5 ulnar nerve glides with cupping and afterwards Needs tactile verbal cueing to stay in supination position Patient to focus on scar massage over carpal tunnel scar until next session 6 pounds for lateral/ 3-point pinch 12 reps 2 sets twice a day Composite forearm flexor stretch 5 reps hold 5 seconds to 3 times a day Ulnar nerve glide 5 reps 2 times a day       PATIENT EDUCATION: Education details: findings of eval and HEP  Person educated: Patient Education method: Explanation, Demonstration, Tactile cues, Verbal cues, and Handouts Education comprehension: verbalized understanding, returned demonstration, verbal cues required, and needs further  education   GOALS: Goals reviewed with patient? Yes LONG TERM GOALS: Target date: 8 weeks  Patient to be independent in home program to decrease scar tissue and tenderness to report decrease stiffness tightness and digit range of motion as well as individually tendon glides Baseline: Increased tightness and stiffness in digit flexion.  Scar thick and adhere as well as tender and pain when hitting no knowledge of scar mobilization and massage Goal  status: progressing  2.  Wrist active range of motion improved to within normal limits symptom-free today able to push and pull heavy door. Baseline: Patient report pain and tenderness over carpal tunnel scar with pressure or hitting it as well as decreased wrist flexion extension unable to push or pull with hand has increased symptoms in the wrist and forearm Goal status:progressing   3.  Grip and prehension strength in the right improved to within normal range for her age for patient to be able to turn doorknob, cut with a knife, open Ziploc bags and do buttons symptom-free Baseline: Right grip 27 pounds left 50 pounds, lateral pinch right 4 pounds left 12 pounds and 3 point pinch right 6 pounds left 14 pounds NOW grip improved but prehension about same  Goal status: progressing   4.  Patient function on PRWHE improved with more than 10 points in ADLs and IADLs Baseline:  PRWHE score to be done next session Goal status: progressing   ASSESSMENT:  CLINICAL IMPRESSION: Continues to wear brace at nighttime, reports mild numbness to R 1-3 digits and pain with driving. Patient present at OT evaluation with increased scar tissue with increased tenderness and pain in palm to volar forearm with use.  Patient made great progress from start of care and wrist range of motion and strength as well as grip and pinch strength.  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  Writing and on the computer as well as distributing medication.    NOW: Pt continued to show decreased grip and prehension strength.  Focus this date on scar adhesion and scar mobilization responded great.  In combination with composite forearm flexor stretch including digits 5 reps hold 5 seconds- followed by ulnar nerve glides.  Decreased patient to 6 pound clothing 10 for lateral /3-point pinch.  Patient to focus on composite forearm extension stretch followed by ulnar nerve glide.  Provide  patient also with a composite nerve glide to do several times during the day.  Forearm symptoms improve with soft tissue mobilization on the forearm and palm. Recent neurosurgery appointment showed continue concern of carpal tunnel, pt plan to find second opinion due to concern that bilateral hand numbness / weakness coming from cervical spine. Patient can benefit from skilled OT services to decrease scar tissue, pain and stiffness and increased motion and strength to return to prior level of function.   PERFORMANCE DEFICITS: in functional skills including ADLs, IADLs, ROM, strength, pain, flexibility, decreased knowledge of use of DME, and UE functional use,   and psychosocial skills including environmental adaptation and routines and behaviors.   IMPAIRMENTS: are limiting patient from ADLs, IADLs, rest and sleep, play, leisure, and social participation.   COMORBIDITIES: has no other co-morbidities that affects occupational performance. Patient will benefit from skilled OT to address above impairments and improve overall function.  MODIFICATION OR ASSISTANCE TO COMPLETE EVALUATION: No modification of tasks or assist necessary to complete an evaluation.  OT OCCUPATIONAL PROFILE AND HISTORY: Problem focused assessment: Including review  of records relating to presenting problem.  CLINICAL DECISION MAKING: LOW - limited treatment options, no task modification necessary  REHAB POTENTIAL: Good for goals  EVALUATION COMPLEXITY: Low      PLAN:  OT FREQUENCY: 1-2x/week  OT DURATION: 6 weeks  PLANNED INTERVENTIONS: 97168 OT Re-evaluation, 97535 self care/ADL training, 02889 therapeutic exercise, 97530 therapeutic activity, 97112 neuromuscular re-education, 97140 manual therapy, 97035 ultrasound, 97018 paraffin, 02960 fluidotherapy, 97034 contrast bath, scar mobilization, passive range of motion, patient/family education, and DME and/or AE instructions    CONSULTED AND AGREED WITH PLAN OF CARE:  Patient    Ancel Peters, OTR/L, CLT 04/24/2024, 3:21 PM

## 2024-04-28 ENCOUNTER — Ambulatory Visit: Admitting: Occupational Therapy

## 2024-04-28 DIAGNOSIS — M47816 Spondylosis without myelopathy or radiculopathy, lumbar region: Secondary | ICD-10-CM | POA: Diagnosis not present

## 2024-04-28 NOTE — Therapy (Deleted)
 OUTPATIENT OCCUPATIONAL THERAPY ORTHO TREATMENT  Patient Name: Tricia Hill MRN: [id number] DOB:1970/07/31, 53 y.o., female Today's Date: 04/28/2024  PCP: Dr Vicci MART PROVIDER: Dr Louis  END OF SESSION:    Past Medical History:  Diagnosis Date   Allergy 2012   Anxiety    Arthritis    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   Asthma    Diabetes mellitus without complication (HCC) 2016   GERD (gastroesophageal reflux disease)    Heart murmur    child   History of kidney stones    Hypertension    Neuromuscular disorder (HCC)    right hands tingling and numbness   PONV (postoperative nausea and vomiting)    Shingles    Sleep apnea    USE CPAP   Past Surgical History:  Procedure Laterality Date   ANTERIOR CERVICAL DECOMP/DISCECTOMY FUSION N/A 04/13/2022   Procedure: Anterior Cervical Discectomy Fusion - Cervical seven-Thoracic one removal of plate;  Surgeon: Louis Shove, MD;  Location: [facility] OR;  Service: Neurosurgery;  Laterality: N/A;   Carpel Tunnel Surgery Right 10/01/2023   CESAREAN SECTION     X 2   CHOLECYSTECTOMY N/A 01/18/2017   Procedure: LAPAROSCOPIC CHOLECYSTECTOMY WITH INTRAOPERATIVE CHOLANGIOGRAM;  Surgeon: Dessa Reyes ORN, MD;  Location: [facility] ORS;  Service: General;  Laterality: N/A;   COLONOSCOPY WITH PROPOFOL  N/A 10/13/2020   Procedure: COLONOSCOPY WITH PROPOFOL ;  Surgeon: Unk Corinn Skiff, MD;  Location: [facility] ENDOSCOPY;  Service: Gastroenterology;  Laterality: N/A;  COVID POSITIVE 09/30/2020   CYSTOSCOPY/URETEROSCOPY/HOLMIUM LASER/STENT PLACEMENT Left 08/21/2022   Procedure: CYSTOSCOPY/URETEROSCOPY/HOLMIUM LASER/STENT PLACEMENT;  Surgeon: Twylla Glendia BROCKS, MD;  Location: [facility] ORS;  Service: Urology;  Laterality: Left;   DIAGNOSTIC LAPAROSCOPY  2006   EXCISION OF ABDOMINAL WALL ENDOMETRIOMA   EXTRACORPOREAL SHOCK WAVE LITHOTRIPSY Left 07/12/2022   Procedure: EXTRACORPOREAL SHOCK WAVE LITHOTRIPSY (ESWL);  Surgeon: Penne Knee, MD;  Location:  [facility] ORS;  Service: Urology;  Laterality: Left;   SPINAL FUSION  2012   C3 and C4   TOTAL ABDOMINAL HYSTERECTOMY     Partial   Patient Active Problem List   Diagnosis Date Noted   Chronic anterior uveitis of left eye 02/05/2024   Chronic low back pain 12/03/2023   Disorder of right median nerve 12/03/2023   Lumbar radiculopathy 12/03/2023   Numbness of hand 12/03/2023   History of carpal tunnel surgery of right wrist 10/01/2023   Uveitis 07/11/2023   Numbness of upper limb 05/28/2023   Pain in right foot 05/28/2023   Cervical spondylosis with myelopathy and radiculopathy 04/13/2022   Osteoarthritis of spine with radiculopathy, cervical region 02/08/2022   Osteoarthritis of spine with radiculopathy, thoracic region 02/08/2022   Anxiety 09/05/2021   HTN (hypertension) 04/16/2018   Fatty liver 12/07/2016   Microscopic hematuria 05/26/2015   OSA (obstructive sleep apnea) 01/31/2015   S/P hysterectomy 01/31/2015   Controlled diabetes mellitus type 2 with complications (HCC) 01/31/2015   Morbid obesity (HCC) 01/31/2015   Hyperlipidemia associated with type 2 diabetes mellitus (HCC) 01/31/2015    ONSET DATE: 10/01/23  REFERRING DIAG: R CTR  THERAPY DIAG:  No diagnosis found.  Rationale for Evaluation and Treatment: Rehabilitation  SUBJECTIVE:   SUBJECTIVE STATEMENT: ***My palm of my thumb side was hurting some the day after I left her last time.  My pinky felt stiff today  PERTINENT HISTORY: DR Louis note: The patient returns today in follow-up. She continues to have some right him discomfort as she recover some carpal tunnel surgery. She has not started  her physical therapy. She also notes persistent posterior cervical pain with some radiation. She is having no definite weakness. Her lumbar pain is stable. She did not get much improvement following recent epidural steroid injection   PRECAUTIONS: None    WEIGHT BEARING RESTRICTIONS: No  PAIN:  Are you having pain?   Tenderness over carpal tunnel scar  FALLS: Has patient fallen in last 6 months? 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 On light duty.  Does house stuff around with cooking and cleaning and laundry.  On her phone.  Reads some.  Has 2 grownup kids and a 38 -year-old  PATIENT GOALS: I just want the numbness better and the pain but then also more strength so I can do my job as a engineer, civil (consulting)  NEXT MD VISIT:   OBJECTIVE:  Note: Objective measures were completed at Evaluation unless otherwise noted.  HAND DOMINANCE: Right  ADLs: Difficulty with cutting food, cooking doing laundry.  Putting on jewelry, do buttons, opening jars or packages or Ziploc bags: Cannot grip or hold things I drop things  FUNCTIONAL OUTCOME MEASURES: To be done excision  UPPER EXTREMITY ROM:     Active ROM Right eval Left eval R 04/21/24  Shoulder flexion     Shoulder abduction     Shoulder adduction     Shoulder extension     Shoulder internal rotation     Shoulder external rotation     Elbow flexion     Elbow extension     Wrist flexion 70  90  Wrist extension 64  70  Wrist ulnar deviation   30 pain ulnar forearm  Wrist radial deviation   20  Wrist pronation   90  Wrist supination   90  (Blank rows = not tested)  Active ROM Right eval Left eval  Thumb MCP (0-60)    Thumb IP (0-80)    Thumb Radial abd/add (0-55)     Thumb Palmar abd/add (0-45)     Thumb Opposition to Small Finger     Index MCP (0-90)     Index PIP (0-100)     Index DIP (0-70)      Long MCP (0-90)      Long PIP (0-100)      Long DIP (0-70)      Ring MCP (0-90)      Ring PIP (0-100)      Ring DIP (0-70)      Little MCP (0-90)      Little PIP (0-100)      Little DIP (0-70)      Digit flexion within normal limits but tight when attempting to make a fist. Opposition within normal limits.  To base of fifth-some discomfort at the A1 pulley of thumb  MCP Thumb palmar radial abduction within normal limits  HAND FUNCTION: Grip strength: Right: 27 lbs; Left: 50 lbs, Lateral pinch: Right: 4 lbs, Left: 12 lbs, and 3 point pinch: Right: 6 lbs, Left: 14 lbs 02/06/24: Grip strength: Right: 29 lbs; Left: 50 lbs, Lateral pinch: Right: 4 lbs, Left: 12 lbs, and 3 point pinch: Right: 6 lbs, Left: 14 lbs  02/11/24: Grip strength: Right: 42 lbs; Left: 50 lbs, Lateral pinch: Right: 5 lbs, Left: 12 lbs, and 3 point pinch: Right: 7 lbs, Left: 14 lbs 03/03/24: Grip strength: Right: 44 lbs; Left: 50 lbs, Lateral pinch: Right: 6 lbs, Left: 12 lbs, and 3  point pinch: Right: 7 lbs, Left: 14 lbs 04/17/24: Grip strength: Right: 42 lbs; Left: 64 lbs, Lateral pinch: Right: 4 lbs, Left: 13 lbs, and 3 point pinch: Right: 6 lbs, Left: 14 lbs  COORDINATION: Decrease in limited because of numbness in digits at times  SENSATION: Patient report increased numbness from thumb through fourth digit on and off during the day and night  EDEMA: Not noticeable.  COGNITION: Overall cognitive status: Within functional limits for tasks assessed      TREATMENT DATE: 04/28/24                                                                                                                            Initiated strengthening again with patient last visit but 8 pounds clothing pen was too much resistance causing some discomfort and pain at the radial thenar eminence and thumb.  Assessed with patient and provide 6 pounds close and pain to use at home for lateral and 3-point pinch-patient had more ease on pinching placing it on and off no pad 12 reps 3 times a day each Patient can use [facility] neoprene for above exercise   Paraffin bath 8 min to decrease pain, scar tissue and stiffness prior to scar mobilization and stretches Graston tool #6 done for brushing on carpal tunnel scar followed by manual therapy by OT responded great tolerated well  Graston #2 for sweeping and fan strokes along  volar forearm  Dry static cupping over volar forearm proximal and mid ; as well as proximal and palmar with 10 reps wrist/digit extension x3 sets Followed by composite forearm flexor stretch And facilitating 5 ulnar nerve glides with cupping and afterwards Needs tactile verbal cueing to stay in supination position Patient to focus on scar massage over carpal tunnel scar until next session 6 pounds for lateral/ 3-point pinch 12 reps 2 sets twice a day Composite forearm flexor stretch 5 reps hold 5 seconds to 3 times a day Ulnar nerve glide 5 reps 2 times a day       PATIENT EDUCATION: Education details: findings of eval and HEP  Person educated: Patient Education method: Explanation, Demonstration, Tactile cues, Verbal cues, and Handouts Education comprehension: verbalized understanding, returned demonstration, verbal cues required, and needs further education   GOALS: Goals reviewed with patient? Yes LONG TERM GOALS: Target date: 8 weeks  Patient to be independent in home program to decrease scar tissue and tenderness to report decrease stiffness tightness and digit range of motion as well as individually tendon glides Baseline: Increased tightness and stiffness in digit flexion.  Scar thick and adhere as well as tender and pain when hitting no knowledge of scar mobilization and massage Goal status: progressing  2.  Wrist active range of motion improved to within normal limits symptom-free today able to push and pull heavy door. Baseline: Patient report pain and tenderness over carpal tunnel scar with pressure or hitting it as well as decreased wrist flexion extension unable to  push or pull with hand has increased symptoms in the wrist and forearm Goal status:progressing   3.  Grip and prehension strength in the right improved to within normal range for her age for patient to be able to turn doorknob, cut with a knife, open Ziploc bags and do buttons symptom-free Baseline: Right  grip 27 pounds left 50 pounds, lateral pinch right 4 pounds left 12 pounds and 3 point pinch right 6 pounds left 14 pounds NOW grip improved but prehension about same  Goal status: progressing   4.  Patient function on PRWHE improved with more than 10 points in ADLs and IADLs Baseline:  PRWHE score to be done next session Goal status: progressing   ASSESSMENT:  CLINICAL IMPRESSION: Continues to wear brace at nighttime, reports mild numbness to R 1-3 digits and pain with driving. Patient present at OT evaluation with increased scar tissue with increased tenderness and pain in palm to volar forearm with use.  Patient made great progress from start of care and wrist range of motion and strength as well as grip and pinch strength.  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  Writing and on the computer as well as distributing medication.    NOW: ***Pt continued to show decreased grip and prehension strength.  Focus this date on scar adhesion and scar mobilization responded great.  In combination with composite forearm flexor stretch including digits 5 reps hold 5 seconds- followed by ulnar nerve glides.  Decreased patient to 6 pound clothing 10 for lateral /3-point pinch.  Patient to focus on composite forearm extension stretch followed by ulnar nerve glide.  Provide patient also with a composite nerve glide to do several times during the day.  Forearm symptoms improve with soft tissue mobilization on the forearm and palm. Recent neurosurgery appointment showed continue concern of carpal tunnel, pt plan to find second opinion due to concern that bilateral hand numbness / weakness coming from cervical spine. Patient can benefit from skilled OT services to decrease scar tissue, pain and stiffness and increased motion and strength to return to prior level of function.   PERFORMANCE DEFICITS: in functional skills including ADLs, IADLs, ROM, strength, pain,  flexibility, decreased knowledge of use of DME, and UE functional use,   and psychosocial skills including environmental adaptation and routines and behaviors.   IMPAIRMENTS: are limiting patient from ADLs, IADLs, rest and sleep, play, leisure, and social participation.   COMORBIDITIES: has no other co-morbidities that affects occupational performance. Patient will benefit from skilled OT to address above impairments and improve overall function.  MODIFICATION OR ASSISTANCE TO COMPLETE EVALUATION: No modification of tasks or assist necessary to complete an evaluation.  OT OCCUPATIONAL PROFILE AND HISTORY: Problem focused assessment: Including review of records relating to presenting problem.  CLINICAL DECISION MAKING: LOW - limited treatment options, no task modification necessary  REHAB POTENTIAL: Good for goals  EVALUATION COMPLEXITY: Low      PLAN:  OT FREQUENCY: 1-2x/week  OT DURATION: 6 weeks  PLANNED INTERVENTIONS: 97168 OT Re-evaluation, 97535 self care/ADL training, 02889 therapeutic exercise, 97530 therapeutic activity, 97112 neuromuscular re-education, 97140 manual therapy, 97035 ultrasound, 97018 paraffin, 02960 fluidotherapy, 97034 contrast bath, scar mobilization, passive range of motion, patient/family education, and DME and/or AE instructions    CONSULTED AND AGREED WITH PLAN OF CARE: Patient    Elston JINNY Slot, OTR/L 04/28/2024, 8:04 AM

## 2024-04-29 ENCOUNTER — Other Ambulatory Visit: Payer: Self-pay

## 2024-04-29 MED FILL — Lancets: 90 days supply | Qty: 100 | Fill #0 | Status: AC

## 2024-04-29 MED FILL — Glucose Blood Test Strip: 90 days supply | Qty: 100 | Fill #0 | Status: AC

## 2024-05-01 ENCOUNTER — Other Ambulatory Visit: Payer: Self-pay

## 2024-05-01 ENCOUNTER — Ambulatory Visit: Admitting: Occupational Therapy

## 2024-05-01 ENCOUNTER — Encounter: Payer: Self-pay | Admitting: Occupational Therapy

## 2024-05-01 DIAGNOSIS — M25631 Stiffness of right wrist, not elsewhere classified: Secondary | ICD-10-CM

## 2024-05-01 DIAGNOSIS — M6281 Muscle weakness (generalized): Secondary | ICD-10-CM

## 2024-05-01 DIAGNOSIS — L905 Scar conditions and fibrosis of skin: Secondary | ICD-10-CM

## 2024-05-01 NOTE — Therapy (Signed)
 OUTPATIENT OCCUPATIONAL THERAPY ORTHO TREATMENT  Patient Name: Tricia Hill MRN: [id number] DOB:21-Oct-1970, 53 y.o., female Today's Date: 05/01/2024  PCP: Dr Vicci MART PROVIDER: Dr Louis  END OF SESSION:  OT End of Session - 05/01/24 1036     Visit Number 12    Number of Visits 16    Date for Recertification  05/19/24    OT Start Time 1036    OT Stop Time 1115    OT Time Calculation (min) 39 min    Activity Tolerance Patient tolerated treatment well    Behavior During Therapy [facility] for tasks assessed/performed          Past Medical History:  Diagnosis Date   Allergy 2012   Anxiety    Arthritis    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   Asthma    Diabetes mellitus without complication (HCC) 2016   GERD (gastroesophageal reflux disease)    Heart murmur    child   History of kidney stones    Hypertension    Neuromuscular disorder (HCC)    right hands tingling and numbness   PONV (postoperative nausea and vomiting)    Shingles    Sleep apnea    USE CPAP   Past Surgical History:  Procedure Laterality Date   ANTERIOR CERVICAL DECOMP/DISCECTOMY FUSION N/A 04/13/2022   Procedure: Anterior Cervical Discectomy Fusion - Cervical seven-Thoracic one removal of plate;  Surgeon: Louis Shove, MD;  Location: [facility] OR;  Service: Neurosurgery;  Laterality: N/A;   Carpel Tunnel Surgery Right 10/01/2023   CESAREAN SECTION     X 2   CHOLECYSTECTOMY N/A 01/18/2017   Procedure: LAPAROSCOPIC CHOLECYSTECTOMY WITH INTRAOPERATIVE CHOLANGIOGRAM;  Surgeon: Dessa Reyes ORN, MD;  Location: [facility] ORS;  Service: General;  Laterality: N/A;   COLONOSCOPY WITH PROPOFOL  N/A 10/13/2020   Procedure: COLONOSCOPY WITH PROPOFOL ;  Surgeon: Unk Corinn Skiff, MD;  Location: [facility] ENDOSCOPY;  Service: Gastroenterology;  Laterality: N/A;  COVID POSITIVE 09/30/2020   CYSTOSCOPY/URETEROSCOPY/HOLMIUM LASER/STENT PLACEMENT Left 08/21/2022   Procedure: CYSTOSCOPY/URETEROSCOPY/HOLMIUM LASER/STENT PLACEMENT;   Surgeon: Twylla Glendia BROCKS, MD;  Location: [facility] ORS;  Service: Urology;  Laterality: Left;   DIAGNOSTIC LAPAROSCOPY  2006   EXCISION OF ABDOMINAL WALL ENDOMETRIOMA   EXTRACORPOREAL SHOCK WAVE LITHOTRIPSY Left 07/12/2022   Procedure: EXTRACORPOREAL SHOCK WAVE LITHOTRIPSY (ESWL);  Surgeon: Penne Knee, MD;  Location: [facility] ORS;  Service: Urology;  Laterality: Left;   SPINAL FUSION  2012   C3 and C4   TOTAL ABDOMINAL HYSTERECTOMY     Partial   Patient Active Problem List   Diagnosis Date Noted   Chronic anterior uveitis of left eye 02/05/2024   Chronic low back pain 12/03/2023   Disorder of right median nerve 12/03/2023   Lumbar radiculopathy 12/03/2023   Numbness of hand 12/03/2023   History of carpal tunnel surgery of right wrist 10/01/2023   Uveitis 07/11/2023   Numbness of upper limb 05/28/2023   Pain in right foot 05/28/2023   Cervical spondylosis with myelopathy and radiculopathy 04/13/2022   Osteoarthritis of spine with radiculopathy, cervical region 02/08/2022   Osteoarthritis of spine with radiculopathy, thoracic region 02/08/2022   Anxiety 09/05/2021   HTN (hypertension) 04/16/2018   Fatty liver 12/07/2016   Microscopic hematuria 05/26/2015   OSA (obstructive sleep apnea) 01/31/2015   S/P hysterectomy 01/31/2015   Controlled diabetes mellitus type 2 with complications (HCC) 01/31/2015   Morbid obesity (HCC) 01/31/2015   Hyperlipidemia associated with type 2 diabetes mellitus (HCC) 01/31/2015    ONSET DATE: 10/01/23  REFERRING DIAG: R CTR  THERAPY DIAG:  Stiffness of right wrist, not elsewhere classified  Muscle weakness (generalized)  Scar tissue  Rationale for Evaluation and Treatment: Rehabilitation  SUBJECTIVE:   SUBJECTIVE STATEMENT: My elbow has been stiffer and my middle finger locked up last night spasming.   PERTINENT HISTORY: DR Louis note: The patient returns today in follow-up. She continues to have some right him discomfort as she recover some  carpal tunnel surgery. She has not started her physical therapy. She also notes persistent posterior cervical pain with some radiation. She is having no definite weakness. Her lumbar pain is stable. She did not get much improvement following recent epidural steroid injection   PRECAUTIONS: None    WEIGHT BEARING RESTRICTIONS: No  PAIN:  Are you having pain?  Tenderness over carpal tunnel scar 1/10, 2/10 at elbow  FALLS: Has patient fallen in last 6 months? 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 On light duty.  Does house stuff around with cooking and cleaning and laundry.  On her phone.  Reads some.  Has 2 grownup kids and a 74 -year-old  PATIENT GOALS: I just want the numbness better and the pain but then also more strength so I can do my job as a engineer, civil (consulting)  NEXT MD VISIT:   OBJECTIVE:  Note: Objective measures were completed at Evaluation unless otherwise noted.  HAND DOMINANCE: Right  ADLs: Difficulty with cutting food, cooking doing laundry.  Putting on jewelry, do buttons, opening jars or packages or Ziploc bags: Cannot grip or hold things I drop things  FUNCTIONAL OUTCOME MEASURES: To be done excision  UPPER EXTREMITY ROM:     Active ROM Right eval Left eval R 04/21/24  Shoulder flexion     Shoulder abduction     Shoulder adduction     Shoulder extension     Shoulder internal rotation     Shoulder external rotation     Elbow flexion     Elbow extension     Wrist flexion 70  90  Wrist extension 64  70  Wrist ulnar deviation   30 pain ulnar forearm  Wrist radial deviation   20  Wrist pronation   90  Wrist supination   90  (Blank rows = not tested)  Active ROM Right eval Left eval  Thumb MCP (0-60)    Thumb IP (0-80)    Thumb Radial abd/add (0-55)     Thumb Palmar abd/add (0-45)     Thumb Opposition to Small Finger     Index MCP (0-90)     Index PIP (0-100)     Index DIP (0-70)       Long MCP (0-90)      Long PIP (0-100)      Long DIP (0-70)      Ring MCP (0-90)      Ring PIP (0-100)      Ring DIP (0-70)      Little MCP (0-90)      Little PIP (0-100)      Little DIP (0-70)      Digit flexion within normal limits but tight when attempting to make a fist. Opposition within normal limits.  To base of fifth-some discomfort at the A1 pulley of thumb MCP Thumb palmar radial abduction within normal limits  HAND FUNCTION: Grip strength: Right: 27 lbs; Left: 50 lbs, Lateral pinch: Right: 4 lbs, Left: 12  lbs, and 3 point pinch: Right: 6 lbs, Left: 14 lbs 02/06/24: Grip strength: Right: 29 lbs; Left: 50 lbs, Lateral pinch: Right: 4 lbs, Left: 12 lbs, and 3 point pinch: Right: 6 lbs, Left: 14 lbs  02/11/24: Grip strength: Right: 42 lbs; Left: 50 lbs, Lateral pinch: Right: 5 lbs, Left: 12 lbs, and 3 point pinch: Right: 7 lbs, Left: 14 lbs 03/03/24: Grip strength: Right: 44 lbs; Left: 50 lbs, Lateral pinch: Right: 6 lbs, Left: 12 lbs, and 3 point pinch: Right: 7 lbs, Left: 14 lbs 04/17/24: Grip strength: Right: 42 lbs; Left: 64 lbs, Lateral pinch: Right: 4 lbs, Left: 13 lbs, and 3 point pinch: Right: 6 lbs, Left: 14 lbs 05/01/24: Grip strength: Right: 50 lbs; Left: 65 lbs, Lateral pinch: Right: 5 lbs, Left: 13 lbs, and 3 point pinch: Right: 6 lbs, Left: 14 lbs  COORDINATION: Decrease in limited because of numbness in digits at times  SENSATION: Patient report increased numbness from thumb through fourth digit on and off during the day and night  EDEMA: Not noticeable.  COGNITION: Overall cognitive status: Within functional limits for tasks assessed      TREATMENT DATE: 05/01/24                                                                                                                            Upon assessment patient's R grip increased Demonstrates decreased pain using 6 pounds resistance clothing pen without use of brace  Paraffin bath 8 min to decrease pain,  scar tissue and stiffness prior to scar mobilization and stretches  Graston tool #2 done for brushing on carpal tunnel scar followed by mini vibrator with improved appearance to incision noted upon completion with less palpable adhesions   Graston #2 for sweeping and fan strokes along volar forearm  Dry static cupping over volar forearm proximal and mid ; as well as proximal and palmar with 10 reps wrist/digit extension x3 sets Followed by composite forearm flexor stretch Needs tactile verbal cueing to stay in supination position  HEP: Patient to focus on scar massage over carpal tunnel scar until next session 6 pounds for lateral/ 3-point pinch 12 reps 2 sets twice a day Composite forearm flexor stretch 5 reps hold 5 seconds to 3 times a day Ulnar nerve glide 5 reps 2 times a day       PATIENT EDUCATION: Education details: findings of eval and HEP  Person educated: Patient Education method: Explanation, Demonstration, Tactile cues, Verbal cues, and Handouts Education comprehension: verbalized understanding, returned demonstration, verbal cues required, and needs further education   GOALS: Goals reviewed with patient? Yes LONG TERM GOALS: Target date: 8 weeks  Patient to be independent in home program to decrease scar tissue and tenderness to report decrease stiffness tightness and digit range of motion as well as individually tendon glides Baseline: Increased tightness and stiffness in digit flexion.  Scar thick and adhere as well as tender and pain when hitting no  knowledge of scar mobilization and massage Goal status: progressing  2.  Wrist active range of motion improved to within normal limits symptom-free today able to push and pull heavy door. Baseline: Patient report pain and tenderness over carpal tunnel scar with pressure or hitting it as well as decreased wrist flexion extension unable to push or pull with hand has increased symptoms in the wrist and forearm Goal  status:progressing   3.  Grip and prehension strength in the right improved to within normal range for her age for patient to be able to turn doorknob, cut with a knife, open Ziploc bags and do buttons symptom-free Baseline: Right grip 27 pounds left 50 pounds, lateral pinch right 4 pounds left 12 pounds and 3 point pinch right 6 pounds left 14 pounds NOW grip improved but prehension about same  Goal status: progressing   4.  Patient function on PRWHE improved with more than 10 points in ADLs and IADLs Baseline:  PRWHE score to be done next session Goal status: progressing   ASSESSMENT:  CLINICAL IMPRESSION: Continues to wear brace at nighttime, reports mild numbness to R 1-3 digits and pain with driving. Patient present at OT evaluation with increased scar tissue with increased tenderness and pain in palm to volar forearm with use.  Patient made great progress from start of care and wrist range of motion and strength as well as grip and pinch strength.  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  Writing and on the computer as well as distributing medication.    NOW: Increased grip strength dominant R hand. Continues to respond well to scar mobilization with IASTM. Cupping in combination with composite forearm flexor stretch including digits 5 reps hold 5 seconds.  Continue  6 pound clothing resistive clip with improvement noted. Focus on composite forearm extension stretch followed by ulnar nerve glide. Provide patient also with a composite nerve glide to do several times during the day.  Forearm symptoms improve with soft tissue mobilization on the forearm and palm. Patient can benefit from skilled OT services to decrease scar tissue, pain and stiffness and increased motion and strength to return to prior level of function.   PERFORMANCE DEFICITS: in functional skills including ADLs, IADLs, ROM, strength, pain, flexibility, decreased knowledge of use  of DME, and UE functional use,   and psychosocial skills including environmental adaptation and routines and behaviors.   IMPAIRMENTS: are limiting patient from ADLs, IADLs, rest and sleep, play, leisure, and social participation.   COMORBIDITIES: has no other co-morbidities that affects occupational performance. Patient will benefit from skilled OT to address above impairments and improve overall function.  MODIFICATION OR ASSISTANCE TO COMPLETE EVALUATION: No modification of tasks or assist necessary to complete an evaluation.  OT OCCUPATIONAL PROFILE AND HISTORY: Problem focused assessment: Including review of records relating to presenting problem.  CLINICAL DECISION MAKING: LOW - limited treatment options, no task modification necessary  REHAB POTENTIAL: Good for goals  EVALUATION COMPLEXITY: Low      PLAN:  OT FREQUENCY: 1-2x/week  OT DURATION: 6 weeks  PLANNED INTERVENTIONS: 97168 OT Re-evaluation, 97535 self care/ADL training, 02889 therapeutic exercise, 97530 therapeutic activity, 97112 neuromuscular re-education, 97140 manual therapy, 97035 ultrasound, 97018 paraffin, 02960 fluidotherapy, 97034 contrast bath, scar mobilization, passive range of motion, patient/family education, and DME and/or AE instructions    CONSULTED AND AGREED WITH PLAN OF CARE: Patient    Elston JINNY Slot, OTR/L 05/01/2024, 10:37 AM

## 2024-05-04 ENCOUNTER — Ambulatory Visit: Admitting: Occupational Therapy

## 2024-05-04 DIAGNOSIS — R2689 Other abnormalities of gait and mobility: Secondary | ICD-10-CM

## 2024-05-04 DIAGNOSIS — M25631 Stiffness of right wrist, not elsewhere classified: Secondary | ICD-10-CM | POA: Diagnosis not present

## 2024-05-04 DIAGNOSIS — R208 Other disturbances of skin sensation: Secondary | ICD-10-CM

## 2024-05-04 DIAGNOSIS — L905 Scar conditions and fibrosis of skin: Secondary | ICD-10-CM

## 2024-05-04 DIAGNOSIS — M6281 Muscle weakness (generalized): Secondary | ICD-10-CM

## 2024-05-04 NOTE — Therapy (Signed)
 OUTPATIENT OCCUPATIONAL THERAPY ORTHO TREATMENT  Patient Name: Tricia Hill MRN: [id number] DOB:03-27-1971, 53 y.o., female Today's Date: 05/04/2024  PCP: Dr Vicci MART PROVIDER: Dr Louis  END OF SESSION:  OT End of Session - 05/04/24 1117     Visit Number 13    Number of Visits 16    Date for Recertification  05/19/24    OT Start Time 1117    OT Stop Time 1204    OT Time Calculation (min) 47 min    Activity Tolerance Patient tolerated treatment well    Behavior During Therapy [facility] for tasks assessed/performed          Past Medical History:  Diagnosis Date   Allergy 2012   Anxiety    Arthritis    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   Asthma    Diabetes mellitus without complication (HCC) 2016   GERD (gastroesophageal reflux disease)    Heart murmur    child   History of kidney stones    Hypertension    Neuromuscular disorder (HCC)    right hands tingling and numbness   PONV (postoperative nausea and vomiting)    Shingles    Sleep apnea    USE CPAP   Past Surgical History:  Procedure Laterality Date   ANTERIOR CERVICAL DECOMP/DISCECTOMY FUSION N/A 04/13/2022   Procedure: Anterior Cervical Discectomy Fusion - Cervical seven-Thoracic one removal of plate;  Surgeon: Louis Shove, MD;  Location: [facility] OR;  Service: Neurosurgery;  Laterality: N/A;   Carpel Tunnel Surgery Right 10/01/2023   CESAREAN SECTION     X 2   CHOLECYSTECTOMY N/A 01/18/2017   Procedure: LAPAROSCOPIC CHOLECYSTECTOMY WITH INTRAOPERATIVE CHOLANGIOGRAM;  Surgeon: Dessa Reyes ORN, MD;  Location: [facility] ORS;  Service: General;  Laterality: N/A;   COLONOSCOPY WITH PROPOFOL  N/A 10/13/2020   Procedure: COLONOSCOPY WITH PROPOFOL ;  Surgeon: Unk Corinn Skiff, MD;  Location: [facility] ENDOSCOPY;  Service: Gastroenterology;  Laterality: N/A;  COVID POSITIVE 09/30/2020   CYSTOSCOPY/URETEROSCOPY/HOLMIUM LASER/STENT PLACEMENT Left 08/21/2022   Procedure: CYSTOSCOPY/URETEROSCOPY/HOLMIUM LASER/STENT PLACEMENT;   Surgeon: Twylla Glendia BROCKS, MD;  Location: [facility] ORS;  Service: Urology;  Laterality: Left;   DIAGNOSTIC LAPAROSCOPY  2006   EXCISION OF ABDOMINAL WALL ENDOMETRIOMA   EXTRACORPOREAL SHOCK WAVE LITHOTRIPSY Left 07/12/2022   Procedure: EXTRACORPOREAL SHOCK WAVE LITHOTRIPSY (ESWL);  Surgeon: Penne Knee, MD;  Location: [facility] ORS;  Service: Urology;  Laterality: Left;   SPINAL FUSION  2012   C3 and C4   TOTAL ABDOMINAL HYSTERECTOMY     Partial   Patient Active Problem List   Diagnosis Date Noted   Chronic anterior uveitis of left eye 02/05/2024   Chronic low back pain 12/03/2023   Disorder of right median nerve 12/03/2023   Lumbar radiculopathy 12/03/2023   Numbness of hand 12/03/2023   History of carpal tunnel surgery of right wrist 10/01/2023   Uveitis 07/11/2023   Numbness of upper limb 05/28/2023   Pain in right foot 05/28/2023   Cervical spondylosis with myelopathy and radiculopathy 04/13/2022   Osteoarthritis of spine with radiculopathy, cervical region 02/08/2022   Osteoarthritis of spine with radiculopathy, thoracic region 02/08/2022   Anxiety 09/05/2021   HTN (hypertension) 04/16/2018   Fatty liver 12/07/2016   Microscopic hematuria 05/26/2015   OSA (obstructive sleep apnea) 01/31/2015   S/P hysterectomy 01/31/2015   Controlled diabetes mellitus type 2 with complications (HCC) 01/31/2015   Morbid obesity (HCC) 01/31/2015   Hyperlipidemia associated with type 2 diabetes mellitus (HCC) 01/31/2015    ONSET DATE: 10/01/23  REFERRING DIAG: R CTR  THERAPY DIAG:  Stiffness of right wrist, not elsewhere classified  Muscle weakness (generalized)  Scar tissue  Other abnormalities of gait and mobility  Other disturbances of skin sensation  Rationale for Evaluation and Treatment: Rehabilitation  SUBJECTIVE:   SUBJECTIVE STATEMENT: The last week did not had that pain on my ulnar side of forearm - and less pain in my hand and wrist since last time- look my 3 point pinch  getter stronger   PERTINENT HISTORY: DR Louis note: The patient returns today in follow-up. She continues to have some right him discomfort as she recover some carpal tunnel surgery. She has not started her physical therapy. She also notes persistent posterior cervical pain with some radiation. She is having no definite weakness. Her lumbar pain is stable. She did not get much improvement following recent epidural steroid injection   PRECAUTIONS: None    WEIGHT BEARING RESTRICTIONS: No  PAIN:  Are you having pain? No pain   FALLS: Has patient fallen in last 6 months? 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 On light duty.  Does house stuff around with cooking and cleaning and laundry.  On her phone.  Reads some.  Has 2 grownup kids and a 47 -year-old  PATIENT GOALS: I just want the numbness better and the pain but then also more strength so I can do my job as a engineer, civil (consulting)  NEXT MD VISIT:   OBJECTIVE:  Note: Objective measures were completed at Evaluation unless otherwise noted.  HAND DOMINANCE: Right  ADLs: Difficulty with cutting food, cooking doing laundry.  Putting on jewelry, do buttons, opening jars or packages or Ziploc bags: Cannot grip or hold things I drop things  FUNCTIONAL OUTCOME MEASURES: To be done excision  UPPER EXTREMITY ROM:     Active ROM Right eval Left eval R 04/21/24  Shoulder flexion     Shoulder abduction     Shoulder adduction     Shoulder extension     Shoulder internal rotation     Shoulder external rotation     Elbow flexion     Elbow extension     Wrist flexion 70  90  Wrist extension 64  70  Wrist ulnar deviation   30 pain ulnar forearm  Wrist radial deviation   20  Wrist pronation   90  Wrist supination   90  (Blank rows = not tested)  Active ROM Right eval Left eval  Thumb MCP (0-60)    Thumb IP (0-80)    Thumb Radial abd/add (0-55)     Thumb Palmar  abd/add (0-45)     Thumb Opposition to Small Finger     Index MCP (0-90)     Index PIP (0-100)     Index DIP (0-70)      Long MCP (0-90)      Long PIP (0-100)      Long DIP (0-70)      Ring MCP (0-90)      Ring PIP (0-100)      Ring DIP (0-70)      Little MCP (0-90)      Little PIP (0-100)      Little DIP (0-70)      Digit flexion within normal limits but tight when attempting to make a fist. Opposition within normal limits.  To base of fifth-some discomfort at the A1 pulley of thumb MCP  Thumb palmar radial abduction within normal limits  HAND FUNCTION: Grip strength: Right: 27 lbs; Left: 50 lbs, Lateral pinch: Right: 4 lbs, Left: 12 lbs, and 3 point pinch: Right: 6 lbs, Left: 14 lbs 02/06/24: Grip strength: Right: 29 lbs; Left: 50 lbs, Lateral pinch: Right: 4 lbs, Left: 12 lbs, and 3 point pinch: Right: 6 lbs, Left: 14 lbs  02/11/24: Grip strength: Right: 42 lbs; Left: 50 lbs, Lateral pinch: Right: 5 lbs, Left: 12 lbs, and 3 point pinch: Right: 7 lbs, Left: 14 lbs 03/03/24: Grip strength: Right: 44 lbs; Left: 50 lbs, Lateral pinch: Right: 6 lbs, Left: 12 lbs, and 3 point pinch: Right: 7 lbs, Left: 14 lbs 04/17/24: Grip strength: Right: 42 lbs; Left: 64 lbs, Lateral pinch: Right: 4 lbs, Left: 13 lbs, and 3 point pinch: Right: 6 lbs, Left: 14 lbs 05/01/24: Grip strength: Right: 50 lbs; Left: 65 lbs, Lateral pinch: Right: 5 lbs, Left: 13 lbs, and 3 point pinch: Right: 6 lbs, Left: 14 lbs 05/04/24 3 point pinchR   7 lbs , 14 lbs L COORDINATION: Decrease in limited because of numbness in digits at times  SENSATION: Patient report increased numbness from thumb through fourth digit on and off during the day and night  EDEMA: Not noticeable.  COGNITION: Overall cognitive status: Within functional limits for tasks assessed      TREATMENT DATE: 05/04/24                                                                                                                            Pt  demonstrates increase strength in 3 point pinch using 6 pounds resistance clothing pen without use of brace But lat pinch still decrease -pt to cont lat pinch with 6lbs pen but upgrade 3 point to 8lbs  But support ulnar wrist on armrest or palm of L hand Wrist strength and motion more midline today  Paraffin bath 8 min to decrease pain, scar tissue and stiffness prior to scar mobilization and stretches  Graston tool #2 done for brushing on carpal tunnel scar followed by mini vibrator with improved appearance to incision noted upon completion with less palpable adhesions   Graston #2 for sweeping and fan strokes along volar forearm inbetween Dry static cupping over volar forearm proximal and mid ; as well as proximal and palmar with 10 reps wrist/digit extension x4 sets Followed by composite forearm flexor stretch neutral but also supination this date  Needs tactile verbal cueing to stay in supination position Done ind tendon glides 5 reps each    HEP: Patient to focus on scar massage over carpal tunnel scar until next session 6 pounds for lateral/  and 8 lbs 3-point pinch 12 reps 2 sets twice a day Prayer stretch done and pt to do  Prior to table slides for composite flexor stretch- 20 reps table slides pain free Tolerate well  Cont afterwards  Ulnar nerve glide 5 reps 2 times a day  PATIENT EDUCATION: Education details: findings of eval and HEP  Person educated: Patient Education method: Explanation, Demonstration, Tactile cues, Verbal cues, and Handouts Education comprehension: verbalized understanding, returned demonstration, verbal cues required, and needs further education   GOALS: Goals reviewed with patient? Yes LONG TERM GOALS: Target date: 8 weeks  Patient to be independent in home program to decrease scar tissue and tenderness to report decrease stiffness tightness and digit range of motion as well as individually tendon glides Baseline: Increased tightness and  stiffness in digit flexion.  Scar thick and adhere as well as tender and pain when hitting no knowledge of scar mobilization and massage Goal status: progressing  2.  Wrist active range of motion improved to within normal limits symptom-free today able to push and pull heavy door. Baseline: Patient report pain and tenderness over carpal tunnel scar with pressure or hitting it as well as decreased wrist flexion extension unable to push or pull with hand has increased symptoms in the wrist and forearm Goal status:progressing   3.  Grip and prehension strength in the right improved to within normal range for her age for patient to be able to turn doorknob, cut with a knife, open Ziploc bags and do buttons symptom-free Baseline: Right grip 27 pounds left 50 pounds, lateral pinch right 4 pounds left 12 pounds and 3 point pinch right 6 pounds left 14 pounds NOW grip improved but prehension about same  Goal status: progressing   4.  Patient function on PRWHE improved with more than 10 points in ADLs and IADLs Baseline:  PRWHE score to be done next session Goal status: progressing   ASSESSMENT:  CLINICAL IMPRESSION: Continues to wear brace at nighttime, reports mild numbness to R 1-3 digits and pain with driving. Patient present at OT evaluation with increased scar tissue with increased tenderness and pain in palm to volar forearm with use.  Patient made great progress from start of care and wrist range of motion and strength as well as grip and pinch strength.  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  Writing and on the computer as well as distributing medication.    NOW: Increased grip  and 3 point strength dominant R hand the last 2 sessions. Continues to respond well to scar mobilization with IASTM. Cupping in combination with composite forearm flexor stretch including digits 5 reps hold 5 seconds.  Continue  6 pound clothing resistive clip  for lat  pinch but upgrade to 8 lbs for 3 point pinch -  Focus on composite forearm flexor stretch  - change to table slide this date - followed by ulnar nerve glide. Patient also with a composite nerve glide to do several times during the day.  Forearm symptoms improve with soft tissue mobilization on the forearm and palm. Patient can benefit from skilled OT services to decrease scar tissue, pain and stiffness and increased motion and strength to return to prior level of function.   PERFORMANCE DEFICITS: in functional skills including ADLs, IADLs, ROM, strength, pain, flexibility, decreased knowledge of use of DME, and UE functional use,   and psychosocial skills including environmental adaptation and routines and behaviors.   IMPAIRMENTS: are limiting patient from ADLs, IADLs, rest and sleep, play, leisure, and social participation.   COMORBIDITIES: has no other co-morbidities that affects occupational performance. Patient will benefit from skilled OT to address above impairments and improve overall function.  MODIFICATION OR ASSISTANCE TO COMPLETE EVALUATION: No modification of  tasks or assist necessary to complete an evaluation.  OT OCCUPATIONAL PROFILE AND HISTORY: Problem focused assessment: Including review of records relating to presenting problem.  CLINICAL DECISION MAKING: LOW - limited treatment options, no task modification necessary  REHAB POTENTIAL: Good for goals  EVALUATION COMPLEXITY: Low      PLAN:  OT FREQUENCY: 1-2x/week  OT DURATION: 6 weeks  PLANNED INTERVENTIONS: 97168 OT Re-evaluation, 97535 self care/ADL training, 02889 therapeutic exercise, 97530 therapeutic activity, 97112 neuromuscular re-education, 97140 manual therapy, 97035 ultrasound, 97018 paraffin, 02960 fluidotherapy, 97034 contrast bath, scar mobilization, passive range of motion, patient/family education, and DME and/or AE instructions    CONSULTED AND AGREED WITH PLAN OF CARE: Patient    Ancel Peters, OTR/L, CLT 05/04/2024, 12:20 PM

## 2024-05-11 ENCOUNTER — Other Ambulatory Visit: Payer: Self-pay

## 2024-05-11 ENCOUNTER — Ambulatory Visit: Admitting: Family Medicine

## 2024-05-11 VITALS — BP 128/76 | HR 81 | Temp 98.0°F | Ht 59.0 in | Wt 226.0 lb

## 2024-05-11 DIAGNOSIS — E118 Type 2 diabetes mellitus with unspecified complications: Secondary | ICD-10-CM

## 2024-05-11 DIAGNOSIS — R7989 Other specified abnormal findings of blood chemistry: Secondary | ICD-10-CM

## 2024-05-11 MED ORDER — TIRZEPATIDE 15 MG/0.5ML ~~LOC~~ SOAJ
15.0000 mg | SUBCUTANEOUS | 1 refills | Status: AC
  Filled 2024-05-11 – 2024-05-13 (×2): qty 6, 84d supply, fill #0
  Filled 2024-06-02 – 2024-07-01 (×2): qty 2, 28d supply, fill #0
  Filled 2024-07-01: qty 2, 28d supply, fill #1

## 2024-05-11 NOTE — Assessment & Plan Note (Signed)
 Doing great with A1c of 6.6. Will increase her mounjaro  to 15mg  and recheck in 3 months. Call with any concerns.

## 2024-05-11 NOTE — Progress Notes (Signed)
 BP 128/76   Pulse 81   Temp 98 F (36.7 C) (Oral)   Ht 4' 11 (1.499 m)   Wt 226 lb (102.5 kg)   LMP  (LMP Unknown)   SpO2 98%   BMI 45.65 kg/m    Subjective:    Patient ID: Tricia Hill, female    DOB: [birth year], 53 y.o.   MRN: [id number]  HPI: Tricia Hill is a 53 y.o. female  Chief Complaint  Patient presents with   Diabetes   Fatigue    tsh   Obesity   DIABETES Hypoglycemic episodes:no Polydipsia/polyuria: no Visual disturbance: no Chest pain: no Paresthesias: no Glucose Monitoring: no  Accucheck frequency: Not Checking Taking Insulin ?: no Blood Pressure Monitoring: not checking Retinal Examination: Up to Date Foot Exam: Up to Date Diabetic Education: Completed Pneumovax: Up to Date Influenza: Up to Date Aspirin: no   Relevant past medical, surgical, family and social history reviewed and updated as indicated. Interim medical history since our last visit reviewed. Allergies and medications reviewed and updated.  Review of Systems  Constitutional: Negative.   Respiratory: Negative.    Cardiovascular: Negative.   Musculoskeletal: Negative.   Neurological: Negative.   Psychiatric/Behavioral: Negative.      Per HPI unless specifically indicated above     Objective:    BP 128/76   Pulse 81   Temp 98 F (36.7 C) (Oral)   Ht 4' 11 (1.499 m)   Wt 226 lb (102.5 kg)   LMP  (LMP Unknown)   SpO2 98%   BMI 45.65 kg/m   Wt Readings from Last 3 Encounters:  05/11/24 226 lb (102.5 kg)  02/07/24 227 lb 9.6 oz (103.2 kg)  12/03/23 224 lb (101.6 kg)    Physical Exam Vitals and nursing note reviewed.  Constitutional:      General: She is not in acute distress.    Appearance: Normal appearance. She is not ill-appearing, toxic-appearing or diaphoretic.  HENT:     Head: Normocephalic and atraumatic.     Right Ear: External ear normal.     Left Ear: External ear normal.     Nose: Nose normal.     Mouth/Throat:     Mouth: Mucous membranes  are moist.     Pharynx: Oropharynx is clear.  Eyes:     General: No scleral icterus.       Right eye: No discharge.        Left eye: No discharge.     Extraocular Movements: Extraocular movements intact.     Conjunctiva/sclera: Conjunctivae normal.     Pupils: Pupils are equal, round, and reactive to light.  Cardiovascular:     Rate and Rhythm: Normal rate and regular rhythm.     Pulses: Normal pulses.     Heart sounds: Normal heart sounds. No murmur heard.    No friction rub. No gallop.  Pulmonary:     Effort: Pulmonary effort is normal. No respiratory distress.     Breath sounds: Normal breath sounds. No stridor. No wheezing, rhonchi or rales.  Chest:     Chest wall: No tenderness.  Musculoskeletal:        General: Normal range of motion.     Cervical back: Normal range of motion and neck supple.  Skin:    General: Skin is warm and dry.     Capillary Refill: Capillary refill takes less than 2 seconds.     Coloration: Skin is not jaundiced or pale.  Findings: No bruising, erythema, lesion or rash.  Neurological:     General: No focal deficit present.     Mental Status: She is alert and oriented to person, place, and time. Mental status is at baseline.  Psychiatric:        Mood and Affect: Mood normal.        Behavior: Behavior normal.        Thought Content: Thought content normal.        Judgment: Judgment normal.     Results for orders placed or performed in visit on 02/14/24  HM DIABETES EYE EXAM   Collection Time: 02/05/24 12:00 AM  Result Value Ref Range   HM Diabetic Eye Exam No Retinopathy No Retinopathy      Assessment & Plan:   Problem List Items Addressed This Visit       Endocrine   Controlled diabetes mellitus type 2 with complications (HCC) - Primary   Doing great with A1c of 6.6. Will increase her mounjaro  to 15mg  and recheck in 3 months. Call with any concerns.       Relevant Medications   tirzepatide  (MOUNJARO ) 15 MG/0.5ML Pen   Other  Relevant Orders   Bayer DCA Hb A1c Waived   Other Visit Diagnoses       Abnormal thyroid blood test       Rechecking labs today. Await results. Treat as needed.   Relevant Orders   TSH        Follow up plan: Return in about 3 months (around 08/09/2024).

## 2024-05-12 ENCOUNTER — Ambulatory Visit: Payer: Self-pay | Admitting: Family Medicine

## 2024-05-12 ENCOUNTER — Other Ambulatory Visit: Payer: Self-pay

## 2024-05-12 DIAGNOSIS — H35352 Cystoid macular degeneration, left eye: Secondary | ICD-10-CM | POA: Diagnosis not present

## 2024-05-12 DIAGNOSIS — Z79899 Other long term (current) drug therapy: Secondary | ICD-10-CM | POA: Diagnosis not present

## 2024-05-12 DIAGNOSIS — H30023 Focal chorioretinal inflammation of posterior pole, bilateral: Secondary | ICD-10-CM | POA: Diagnosis not present

## 2024-05-12 DIAGNOSIS — R911 Solitary pulmonary nodule: Secondary | ICD-10-CM | POA: Diagnosis not present

## 2024-05-12 DIAGNOSIS — H2012 Chronic iridocyclitis, left eye: Secondary | ICD-10-CM | POA: Diagnosis not present

## 2024-05-12 DIAGNOSIS — E039 Hypothyroidism, unspecified: Secondary | ICD-10-CM | POA: Insufficient documentation

## 2024-05-12 DIAGNOSIS — H3023 Posterior cyclitis, bilateral: Secondary | ICD-10-CM | POA: Diagnosis not present

## 2024-05-12 LAB — TSH: TSH: 5.24 u[IU]/mL — ABNORMAL HIGH (ref 0.450–4.500)

## 2024-05-12 LAB — BAYER DCA HB A1C WAIVED: HB A1C (BAYER DCA - WAIVED): 6.6 % — ABNORMAL HIGH (ref 4.8–5.6)

## 2024-05-12 MED ORDER — LEVOTHYROXINE SODIUM 25 MCG PO TABS
25.0000 ug | ORAL_TABLET | Freq: Every day | ORAL | 1 refills | Status: DC
  Filled 2024-05-12: qty 30, 30d supply, fill #0
  Filled 2024-06-23: qty 30, 30d supply, fill #1

## 2024-05-13 ENCOUNTER — Other Ambulatory Visit: Payer: Self-pay

## 2024-05-13 NOTE — Progress Notes (Signed)
 Scheduled

## 2024-05-14 ENCOUNTER — Ambulatory Visit: Admitting: Occupational Therapy

## 2024-05-14 ENCOUNTER — Other Ambulatory Visit: Payer: Self-pay

## 2024-05-18 ENCOUNTER — Inpatient Hospital Stay: Admission: RE | Admit: 2024-05-18 | Discharge: 2024-05-18 | Attending: Family Medicine

## 2024-05-18 DIAGNOSIS — Z1231 Encounter for screening mammogram for malignant neoplasm of breast: Secondary | ICD-10-CM

## 2024-05-18 DIAGNOSIS — M47816 Spondylosis without myelopathy or radiculopathy, lumbar region: Secondary | ICD-10-CM | POA: Diagnosis not present

## 2024-05-21 ENCOUNTER — Ambulatory Visit: Admitting: Occupational Therapy

## 2024-05-21 DIAGNOSIS — L905 Scar conditions and fibrosis of skin: Secondary | ICD-10-CM | POA: Diagnosis present

## 2024-05-21 DIAGNOSIS — M25631 Stiffness of right wrist, not elsewhere classified: Secondary | ICD-10-CM | POA: Diagnosis present

## 2024-05-21 DIAGNOSIS — R208 Other disturbances of skin sensation: Secondary | ICD-10-CM

## 2024-05-21 DIAGNOSIS — M6281 Muscle weakness (generalized): Secondary | ICD-10-CM

## 2024-05-21 NOTE — Therapy (Signed)
 OUTPATIENT OCCUPATIONAL THERAPY ORTHO TREATMENT/RECERT  Patient Name: Tricia Hill MRN: [id number] DOB:[birth year], 53 y.o., female Today's Date: 05/21/2024  PCP: Dr Vicci MART PROVIDER: Dr Louis  END OF SESSION:  OT End of Session - 05/21/24 1124     Visit Number 14    Number of Visits 16    Date for Recertification  07/02/24    OT Start Time 1122    OT Stop Time 1200    OT Time Calculation (min) 38 min    Activity Tolerance Patient tolerated treatment well    Behavior During Therapy [facility] for tasks assessed/performed          Past Medical History:  Diagnosis Date   Allergy 2012   Anxiety    Arthritis    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   Asthma    Diabetes mellitus without complication (HCC) 2016   GERD (gastroesophageal reflux disease)    Heart murmur    child   History of kidney stones    Hypertension    Neuromuscular disorder (HCC)    right hands tingling and numbness   PONV (postoperative nausea and vomiting)    Shingles    Sleep apnea    USE CPAP   Past Surgical History:  Procedure Laterality Date   ANTERIOR CERVICAL DECOMP/DISCECTOMY FUSION N/A 04/13/2022   Procedure: Anterior Cervical Discectomy Fusion - Cervical seven-Thoracic one removal of plate;  Surgeon: Louis Shove, MD;  Location: [facility];  Service: Neurosurgery;  Laterality: N/A;   Carpel Tunnel Surgery Right 10/01/2023   CESAREAN SECTION     X 2   CHOLECYSTECTOMY N/A 01/18/2017   Procedure: LAPAROSCOPIC CHOLECYSTECTOMY WITH INTRAOPERATIVE CHOLANGIOGRAM;  Surgeon: Dessa Reyes ORN, MD;  Location: [facility] ORS;  Service: General;  Laterality: N/A;   COLONOSCOPY WITH PROPOFOL  N/A 10/13/2020   Procedure: COLONOSCOPY WITH PROPOFOL ;  Surgeon: Unk Corinn Skiff, MD;  Location: [facility] ENDOSCOPY;  Service: Gastroenterology;  Laterality: N/A;  COVID POSITIVE 09/30/2020   CYSTOSCOPY/URETEROSCOPY/HOLMIUM LASER/STENT PLACEMENT Left 08/21/2022   Procedure: CYSTOSCOPY/URETEROSCOPY/HOLMIUM LASER/STENT  PLACEMENT;  Surgeon: Twylla Glendia BROCKS, MD;  Location: [facility] ORS;  Service: Urology;  Laterality: Left;   DIAGNOSTIC LAPAROSCOPY  2006   EXCISION OF ABDOMINAL WALL ENDOMETRIOMA   EXTRACORPOREAL SHOCK WAVE LITHOTRIPSY Left 07/12/2022   Procedure: EXTRACORPOREAL SHOCK WAVE LITHOTRIPSY (ESWL);  Surgeon: Penne Knee, MD;  Location: [facility] ORS;  Service: Urology;  Laterality: Left;   SPINAL FUSION  2012   C3 and C4   TOTAL ABDOMINAL HYSTERECTOMY     Partial   Patient Active Problem List   Diagnosis Date Noted   Acquired hypothyroidism 05/12/2024   Chronic anterior uveitis of left eye 02/05/2024   Chronic low back pain 12/03/2023   Disorder of right median nerve 12/03/2023   Lumbar radiculopathy 12/03/2023   Numbness of hand 12/03/2023   History of carpal tunnel surgery of right wrist 10/01/2023   Uveitis 07/11/2023   Numbness of upper limb 05/28/2023   Pain in right foot 05/28/2023   Cervical spondylosis with myelopathy and radiculopathy 04/13/2022   Osteoarthritis of spine with radiculopathy, cervical region 02/08/2022   Osteoarthritis of spine with radiculopathy, thoracic region 02/08/2022   Anxiety 09/05/2021   HTN (hypertension) 04/16/2018   Fatty liver 12/07/2016   Microscopic hematuria 05/26/2015   OSA (obstructive sleep apnea) 01/31/2015   S/P hysterectomy 01/31/2015   Controlled diabetes mellitus type 2 with complications (HCC) 01/31/2015   Morbid obesity (HCC) 01/31/2015   Hyperlipidemia associated with type 2 diabetes mellitus (HCC) 01/31/2015  ONSET DATE: 10/01/23  REFERRING DIAG: R CTR  THERAPY DIAG:  Stiffness of right wrist, not elsewhere classified  Muscle weakness (generalized)  Scar tissue  Other disturbances of skin sensation  Rationale for Evaluation and Treatment: Rehabilitation  SUBJECTIVE:   SUBJECTIVE STATEMENT: I don't have any pain - my hand has not been hurting - look I have more pinch strength   PERTINENT HISTORY: DR Louis note: The patient  returns today in follow-up. She continues to have some right him discomfort as she recover some carpal tunnel surgery. She has not started her physical therapy. She also notes persistent posterior cervical pain with some radiation. She is having no definite weakness. Her lumbar pain is stable. She did not get much improvement following recent epidural steroid injection   PRECAUTIONS: None    WEIGHT BEARING RESTRICTIONS: No  PAIN:  Are you having pain? No pain   FALLS: Has patient fallen in last 6 months? 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 On light duty.  Does house stuff around with cooking and cleaning and laundry.  On her phone.  Reads some.  Has 2 grownup kids and a 55 -year-old  PATIENT GOALS: I just want the numbness better and the pain but then also more strength so I can do my job as a engineer, civil (consulting)  NEXT MD VISIT:   OBJECTIVE:  Note: Objective measures were completed at Evaluation unless otherwise noted.  HAND DOMINANCE: Right  ADLs: Difficulty with cutting food, cooking doing laundry.  Putting on jewelry, do buttons, opening jars or packages or Ziploc bags: Cannot grip or hold things I drop things  FUNCTIONAL OUTCOME MEASURES: To be done excision  UPPER EXTREMITY ROM:     Active ROM Right eval Left eval R 04/21/24 R 05/21/24  Shoulder flexion      Shoulder abduction      Shoulder adduction      Shoulder extension      Shoulder internal rotation      Shoulder external rotation      Elbow flexion      Elbow extension      Wrist flexion 70  90 95  Wrist extension 64  70 70  Wrist ulnar deviation   30 pain ulnar forearm 30  Wrist radial deviation   20 20  Wrist pronation   90 90  Wrist supination   90 90  (Blank rows = not tested)  Active ROM Right eval Left eval  Thumb MCP (0-60)    Thumb IP (0-80)    Thumb Radial abd/add (0-55)     Thumb Palmar abd/add (0-45)     Thumb  Opposition to Small Finger     Index MCP (0-90)     Index PIP (0-100)     Index DIP (0-70)      Long MCP (0-90)      Long PIP (0-100)      Long DIP (0-70)      Ring MCP (0-90)      Ring PIP (0-100)      Ring DIP (0-70)      Little MCP (0-90)      Little PIP (0-100)      Little DIP (0-70)      Digit flexion within normal limits but tight when attempting to make a fist. Opposition within normal limits.  To base of fifth-some discomfort at the A1 pulley of thumb MCP Thumb  palmar radial abduction within normal limits  HAND FUNCTION: Grip strength: Right: 27 lbs; Left: 50 lbs, Lateral pinch: Right: 4 lbs, Left: 12 lbs, and 3 point pinch: Right: 6 lbs, Left: 14 lbs 02/06/24: Grip strength: Right: 29 lbs; Left: 50 lbs, Lateral pinch: Right: 4 lbs, Left: 12 lbs, and 3 point pinch: Right: 6 lbs, Left: 14 lbs  02/11/24: Grip strength: Right: 42 lbs; Left: 50 lbs, Lateral pinch: Right: 5 lbs, Left: 12 lbs, and 3 point pinch: Right: 7 lbs, Left: 14 lbs 03/03/24: Grip strength: Right: 44 lbs; Left: 50 lbs, Lateral pinch: Right: 6 lbs, Left: 12 lbs, and 3 point pinch: Right: 7 lbs, Left: 14 lbs 04/17/24: Grip strength: Right: 42 lbs; Left: 64 lbs, Lateral pinch: Right: 4 lbs, Left: 13 lbs, and 3 point pinch: Right: 6 lbs, Left: 14 lbs 05/01/24: Grip strength: Right: 50 lbs; Left: 65 lbs, Lateral pinch: Right: 5 lbs, Left: 13 lbs, and 3 point pinch: Right: 6 lbs, Left: 14 lbs 05/04/24 3 point pinchR   7 lbs , 14 lbs L 05/21/24: Grip strength: Right: 55 lbs; Left: 65 lbs, Lateral pinch: Right: 6 lbs, Left: 13 lbs, and 3 point pinch: Right: 8 lbs, Left: 16 lbs  COORDINATION: Decrease in limited because of numbness in digits at times  SENSATION: Patient report increased numbness from thumb through fourth digit on and off during the day and night  EDEMA: Not noticeable.  COGNITION: Overall cognitive status: Within functional limits for tasks assessed      TREATMENT DATE: 05/21/24                                                                                                                                Patient arrived with reports of no pain since last seen. Upon assessment patient grip and prehension strength improved greatly pain-free.  See flowsheet. Patient still significantly decreased in grip and prehension on the right.  Recommend for patient to continue with 3-point lateral pinch using closing pants graded 4 pounds and 6 pounds and 8 pounds. Information provided to ordered from [employer]. Patient is a wrist active range of motion within normal limits.  Strength this date 5/5 except for supination and pronation. Patient needs verbal cueing to not compensate with grip during supination and pronation but focus on forearm. Scar at the carpal tunnel improved greatly.  To daughter to continue with scar massage no tenderness Patient still some trigger points in the volar forearm but improved greatly.  Can continue with her wrist extension.  Stretches. Did discuss with patient again joint protection principles as well as modification and adaptive equipment.  Verbalized understanding As well as ergonomics at her workstation on the computer. Patient to continue with home program for 4 weeks until next follow-up with Dr. Louis patient in agreement      PATIENT EDUCATION: Education details:  progress And HEP  Person educated: Patient Education method: Explanation, Demonstration, Tactile cues, Verbal cues,  and Handouts Education comprehension: verbalized understanding, returned demonstration, verbal cues required, and needs further education   GOALS: Goals reviewed with patient? Yes LONG TERM GOALS: Target date: 8 weeks  Patient to be independent in home program to decrease scar tissue and tenderness to report decrease stiffness tightness and digit range of motion as well as individually tendon glides Baseline: Increased tightness and stiffness in digit flexion.  Scar thick and adhere  as well as tender and pain when hitting no knowledge of scar mobilization and massage Goal status: Met  2.  Wrist active range of motion improved to within normal limits symptom-free today able to push and pull heavy door. Baseline: Patient report pain and tenderness over carpal tunnel scar with pressure or hitting it as well as decreased wrist flexion extension unable to push or pull with hand has increased symptoms in the wrist and forearm Goal status: Met  3.  Grip and prehension strength in the right improved to within normal range for her age for patient to be able to turn doorknob, cut with a knife, open Ziploc bags and do buttons symptom-free Baseline: Right grip 27 pounds left 50 pounds, lateral pinch right 4 pounds left 12 pounds and 3 point pinch right 6 pounds left 14 pounds NOW grip improved but prehension about same  Goal status: progressing   4.  Patient function on PRWHE improved with more than 10 points in ADLs and IADLs Baseline:  PRWHE score to be done next session; PRWHE function 12/50 and pain this date 0/50 Goal status: Met   ASSESSMENT:  CLINICAL IMPRESSION: Continues to wear brace at nighttime, reports mild numbness to R 1-3 digits and pain with driving. Patient present at OT evaluation with increased scar tissue with increased tenderness and pain in palm to volar forearm with use.  Patient made great progress from start of care and wrist range of motion and strength as well as grip and pinch strength.  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  Writing and on the computer as well as distributing medication.    NOW: Increased grip  and 3 point strength dominant R hand the last 3 sessions. Continues to respond well to scar mobilization with IASTM. Cupping in combination with composite forearm flexor stretch including digits 5 reps hold 5 seconds.  Continue  6 pound clothing resistive clip  for lat pinch but upgrade to 8 lbs for 3  point pinch -  Focus on composite forearm flexor stretch  - change to table slide this date - followed by ulnar nerve glide.  Patient arrives with reports of no pain in the last week or 2.  Patient's strength improving as well as wrist active range of motion and strength improved greatly.  Able to functionally use her right hand in ADLs and IADLs pain-free the last week or 2.  Patient to continue with home program for a month until follow-up with Dr. Louis.  Patient can benefit from skilled OT services to decrease scar tissue, pain and stiffness and increased motion and strength to return to prior level of function.   PERFORMANCE DEFICITS: in functional skills including ADLs, IADLs, ROM, strength, pain, flexibility, decreased knowledge of use of DME, and UE functional use,   and psychosocial skills including environmental adaptation and routines and behaviors.   IMPAIRMENTS: are limiting patient from ADLs, IADLs, rest and sleep, play, leisure, and social participation.   COMORBIDITIES: has no other co-morbidities that affects occupational performance.  Patient will benefit from skilled OT to address above impairments and improve overall function.  MODIFICATION OR ASSISTANCE TO COMPLETE EVALUATION: No modification of tasks or assist necessary to complete an evaluation.  OT OCCUPATIONAL PROFILE AND HISTORY: Problem focused assessment: Including review of records relating to presenting problem.  CLINICAL DECISION MAKING: LOW - limited treatment options, no task modification necessary  REHAB POTENTIAL: Good for goals  EVALUATION COMPLEXITY: Low      PLAN:  OT FREQUENCY: 2 visits   OT DURATION: 6 wks  PLANNED INTERVENTIONS: 97168 OT Re-evaluation, 97535 self care/ADL training, 02889 therapeutic exercise, 97530 therapeutic activity, 97112 neuromuscular re-education, 97140 manual therapy, 97035 ultrasound, 97018 paraffin, 02960 fluidotherapy, 97034 contrast bath, scar mobilization, passive range  of motion, patient/family education, and DME and/or AE instructions    CONSULTED AND AGREED WITH PLAN OF CARE: Patient    Ancel Peters, OTR/L, CLT 05/21/2024, 1:05 PM

## 2024-05-28 ENCOUNTER — Ambulatory Visit: Admitting: Occupational Therapy

## 2024-06-02 ENCOUNTER — Other Ambulatory Visit: Payer: Self-pay

## 2024-06-23 ENCOUNTER — Other Ambulatory Visit: Payer: Self-pay | Admitting: Family Medicine

## 2024-06-23 MED FILL — Atorvastatin Calcium Tab 40 MG (Base Equivalent): ORAL | 84 days supply | Qty: 12 | Fill #1 | Status: AC

## 2024-06-24 ENCOUNTER — Other Ambulatory Visit

## 2024-06-24 ENCOUNTER — Other Ambulatory Visit: Payer: Self-pay

## 2024-06-24 DIAGNOSIS — E039 Hypothyroidism, unspecified: Secondary | ICD-10-CM

## 2024-06-25 ENCOUNTER — Other Ambulatory Visit: Payer: Self-pay

## 2024-06-25 ENCOUNTER — Ambulatory Visit: Payer: Self-pay | Admitting: Family Medicine

## 2024-06-25 LAB — TSH: TSH: 2.87 u[IU]/mL (ref 0.450–4.500)

## 2024-06-25 MED ORDER — LEVOTHYROXINE SODIUM 25 MCG PO TABS
25.0000 ug | ORAL_TABLET | Freq: Every day | ORAL | 3 refills | Status: AC
  Filled 2024-07-01 (×2): qty 90, 90d supply, fill #0

## 2024-06-25 MED ORDER — ESCITALOPRAM OXALATE 5 MG PO TABS
5.0000 mg | ORAL_TABLET | Freq: Every day | ORAL | 0 refills | Status: AC
  Filled 2024-06-25 – 2024-06-26 (×2): qty 90, 90d supply, fill #0

## 2024-06-25 NOTE — Telephone Encounter (Signed)
 Requested Prescriptions  Pending Prescriptions Disp Refills   escitalopram  (LEXAPRO ) 5 MG tablet 90 tablet 0    Sig: Take 1 tablet (5 mg total) by mouth daily.     Psychiatry:  Antidepressants - SSRI Passed - 06/25/2024  8:24 AM      Passed - Valid encounter within last 6 months    Recent Outpatient Visits           1 month ago Controlled diabetes mellitus type 2 with complications (HCC)   [place] [facility] [place], Megan P, DO   4 months ago Routine general medical examination at a health care facility   [facility], Megan P, DO   6 months ago Anxiety   [place] [facility] [place], [place] P, DO   8 months ago Controlled type 2 diabetes mellitus with complication, without long-term current use of insulin  [facility])   [place] [facility], Megan P, DO   10 months ago OSA (obstructive sleep apnea)   [place] [facility], Megan P, DO

## 2024-06-26 ENCOUNTER — Other Ambulatory Visit: Payer: Self-pay

## 2024-06-26 ENCOUNTER — Other Ambulatory Visit (HOSPITAL_COMMUNITY): Payer: Self-pay

## 2024-07-01 ENCOUNTER — Other Ambulatory Visit (HOSPITAL_COMMUNITY): Payer: Self-pay

## 2024-07-01 ENCOUNTER — Other Ambulatory Visit: Payer: Self-pay

## 2024-07-03 ENCOUNTER — Other Ambulatory Visit: Payer: Self-pay

## 2024-08-10 ENCOUNTER — Ambulatory Visit: Admitting: Family Medicine
